# Patient Record
Sex: Female | Born: 1945
Health system: Southern US, Community
[De-identification: ages and names within clinical notes are randomized; demographics above are authoritative.]

## PROBLEM LIST (undated history)

## (undated) DIAGNOSIS — M47812 Spondylosis without myelopathy or radiculopathy, cervical region: Secondary | ICD-10-CM

## (undated) DIAGNOSIS — M1990 Unspecified osteoarthritis, unspecified site: Secondary | ICD-10-CM

## (undated) DIAGNOSIS — H269 Unspecified cataract: Secondary | ICD-10-CM

## (undated) DIAGNOSIS — E559 Vitamin D deficiency, unspecified: Secondary | ICD-10-CM

## (undated) DIAGNOSIS — K219 Gastro-esophageal reflux disease without esophagitis: Secondary | ICD-10-CM

## (undated) DIAGNOSIS — I776 Arteritis, unspecified: Secondary | ICD-10-CM

## (undated) DIAGNOSIS — Z9889 Other specified postprocedural states: Secondary | ICD-10-CM

## (undated) DIAGNOSIS — L409 Psoriasis, unspecified: Secondary | ICD-10-CM

## (undated) DIAGNOSIS — IMO0001 Reserved for inherently not codable concepts without codable children: Secondary | ICD-10-CM

## (undated) DIAGNOSIS — N390 Urinary tract infection, site not specified: Secondary | ICD-10-CM

## (undated) DIAGNOSIS — Z8719 Personal history of other diseases of the digestive system: Secondary | ICD-10-CM

## (undated) DIAGNOSIS — M310 Hypersensitivity angiitis: Secondary | ICD-10-CM

## (undated) DIAGNOSIS — K648 Other hemorrhoids: Secondary | ICD-10-CM

## (undated) DIAGNOSIS — E785 Hyperlipidemia, unspecified: Secondary | ICD-10-CM

## (undated) DIAGNOSIS — N321 Vesicointestinal fistula: Secondary | ICD-10-CM

## (undated) DIAGNOSIS — I4891 Unspecified atrial fibrillation: Secondary | ICD-10-CM

## (undated) DIAGNOSIS — M8580 Other specified disorders of bone density and structure, unspecified site: Secondary | ICD-10-CM

## (undated) DIAGNOSIS — I471 Supraventricular tachycardia: Secondary | ICD-10-CM

## (undated) DIAGNOSIS — K5790 Diverticulosis of intestine, part unspecified, without perforation or abscess without bleeding: Secondary | ICD-10-CM

## (undated) DIAGNOSIS — K429 Umbilical hernia without obstruction or gangrene: Secondary | ICD-10-CM

## (undated) DIAGNOSIS — I472 Ventricular tachycardia: Secondary | ICD-10-CM

## (undated) DIAGNOSIS — G7000 Myasthenia gravis without (acute) exacerbation: Secondary | ICD-10-CM

## (undated) DIAGNOSIS — I700 Atherosclerosis of aorta: Secondary | ICD-10-CM

## (undated) DIAGNOSIS — G479 Sleep disorder, unspecified: Secondary | ICD-10-CM

## (undated) DIAGNOSIS — T7840XA Allergy, unspecified, initial encounter: Secondary | ICD-10-CM

## (undated) DIAGNOSIS — R112 Nausea with vomiting, unspecified: Secondary | ICD-10-CM

## (undated) DIAGNOSIS — K5792 Diverticulitis of intestine, part unspecified, without perforation or abscess without bleeding: Secondary | ICD-10-CM

## (undated) DIAGNOSIS — I10 Essential (primary) hypertension: Secondary | ICD-10-CM

## (undated) DIAGNOSIS — J189 Pneumonia, unspecified organism: Secondary | ICD-10-CM

## (undated) HISTORY — PX: HERNIA REPAIR: SHX51

## (undated) HISTORY — PX: APPENDECTOMY: SHX54

## (undated) HISTORY — DX: Psoriasis, unspecified: L40.9

## (undated) HISTORY — PX: CHOLECYSTECTOMY: SHX55

## (undated) HISTORY — DX: Myasthenia gravis without (acute) exacerbation: G70.00

## (undated) HISTORY — PX: CERVICAL FUSION: SHX112

## (undated) HISTORY — PX: THORACENTESIS: SHX235

## (undated) HISTORY — DX: Supraventricular tachycardia: I47.1

## (undated) HISTORY — DX: Unspecified atrial fibrillation: I48.91

## (undated) HISTORY — DX: Reserved for inherently not codable concepts without codable children: IMO0001

## (undated) HISTORY — DX: Ventricular tachycardia: I47.2

## (undated) HISTORY — PX: TOTAL SHOULDER ARTHROPLASTY: SHX126

## (undated) HISTORY — DX: Gastro-esophageal reflux disease without esophagitis: K21.9

## (undated) HISTORY — DX: Unspecified cataract: H26.9

## (undated) HISTORY — PX: SHOULDER SURGERY: SHX246

## (undated) HISTORY — DX: Arteritis, unspecified: I77.6

## (undated) HISTORY — PX: AV FISTULA REPAIR: SHX563

## (undated) HISTORY — PX: TOTAL ABDOMINAL HYSTERECTOMY W/ BILATERAL SALPINGOOPHORECTOMY: SHX83

## (undated) HISTORY — DX: Allergy, unspecified, initial encounter: T78.40XA

## (undated) HISTORY — DX: Hyperlipidemia, unspecified: E78.5

---

## 1997-06-06 ENCOUNTER — Other Ambulatory Visit: Admission: RE | Admit: 1997-06-06 | Discharge: 1997-06-06 | Payer: Self-pay | Admitting: Obstetrics and Gynecology

## 1998-06-24 ENCOUNTER — Inpatient Hospital Stay (HOSPITAL_COMMUNITY): Admission: EM | Admit: 1998-06-24 | Discharge: 1998-06-25 | Payer: Self-pay | Admitting: Internal Medicine

## 1998-09-01 ENCOUNTER — Other Ambulatory Visit: Admission: RE | Admit: 1998-09-01 | Discharge: 1998-09-01 | Payer: Self-pay | Admitting: Obstetrics and Gynecology

## 1999-02-05 ENCOUNTER — Encounter: Admission: RE | Admit: 1999-02-05 | Discharge: 1999-02-05 | Payer: Self-pay | Admitting: Orthopedic Surgery

## 1999-02-05 ENCOUNTER — Encounter: Payer: Self-pay | Admitting: Orthopedic Surgery

## 2000-09-26 ENCOUNTER — Encounter: Payer: Self-pay | Admitting: Internal Medicine

## 2000-09-26 ENCOUNTER — Encounter: Admission: RE | Admit: 2000-09-26 | Discharge: 2000-09-26 | Payer: Self-pay | Admitting: Internal Medicine

## 2001-10-16 ENCOUNTER — Encounter: Admission: RE | Admit: 2001-10-16 | Discharge: 2001-10-16 | Payer: Self-pay | Admitting: Internal Medicine

## 2001-10-16 ENCOUNTER — Encounter: Payer: Self-pay | Admitting: Internal Medicine

## 2002-04-08 ENCOUNTER — Observation Stay (HOSPITAL_COMMUNITY): Admission: EM | Admit: 2002-04-08 | Discharge: 2002-04-09 | Payer: Self-pay

## 2003-06-19 ENCOUNTER — Encounter: Payer: Self-pay | Admitting: Internal Medicine

## 2004-06-29 ENCOUNTER — Ambulatory Visit: Payer: Self-pay | Admitting: Internal Medicine

## 2005-08-31 ENCOUNTER — Ambulatory Visit (HOSPITAL_BASED_OUTPATIENT_CLINIC_OR_DEPARTMENT_OTHER): Admission: RE | Admit: 2005-08-31 | Discharge: 2005-09-01 | Payer: Self-pay | Admitting: Orthopedic Surgery

## 2005-09-16 ENCOUNTER — Ambulatory Visit: Payer: Self-pay | Admitting: Internal Medicine

## 2005-09-16 ENCOUNTER — Observation Stay (HOSPITAL_COMMUNITY): Admission: EM | Admit: 2005-09-16 | Discharge: 2005-09-17 | Payer: Self-pay | Admitting: Emergency Medicine

## 2005-09-17 ENCOUNTER — Ambulatory Visit: Payer: Self-pay | Admitting: Internal Medicine

## 2005-09-20 ENCOUNTER — Ambulatory Visit: Payer: Self-pay | Admitting: Internal Medicine

## 2005-09-28 ENCOUNTER — Ambulatory Visit: Payer: Self-pay | Admitting: Internal Medicine

## 2006-02-04 ENCOUNTER — Ambulatory Visit (HOSPITAL_COMMUNITY): Admission: RE | Admit: 2006-02-04 | Discharge: 2006-02-05 | Payer: Self-pay | Admitting: Neurosurgery

## 2006-06-20 ENCOUNTER — Encounter: Payer: Self-pay | Admitting: Internal Medicine

## 2006-10-16 ENCOUNTER — Emergency Department (HOSPITAL_COMMUNITY): Admission: EM | Admit: 2006-10-16 | Discharge: 2006-10-16 | Payer: Self-pay | Admitting: Emergency Medicine

## 2006-10-21 ENCOUNTER — Telehealth (INDEPENDENT_AMBULATORY_CARE_PROVIDER_SITE_OTHER): Payer: Self-pay | Admitting: *Deleted

## 2006-10-24 ENCOUNTER — Ambulatory Visit: Payer: Self-pay | Admitting: Internal Medicine

## 2006-10-27 ENCOUNTER — Telehealth (INDEPENDENT_AMBULATORY_CARE_PROVIDER_SITE_OTHER): Payer: Self-pay | Admitting: *Deleted

## 2006-10-27 ENCOUNTER — Emergency Department (HOSPITAL_COMMUNITY): Admission: EM | Admit: 2006-10-27 | Discharge: 2006-10-28 | Payer: Self-pay | Admitting: Emergency Medicine

## 2006-11-17 ENCOUNTER — Ambulatory Visit: Payer: Self-pay | Admitting: Internal Medicine

## 2006-11-21 ENCOUNTER — Telehealth (INDEPENDENT_AMBULATORY_CARE_PROVIDER_SITE_OTHER): Payer: Self-pay | Admitting: *Deleted

## 2006-11-30 ENCOUNTER — Encounter (HOSPITAL_BASED_OUTPATIENT_CLINIC_OR_DEPARTMENT_OTHER): Admission: RE | Admit: 2006-11-30 | Discharge: 2007-01-15 | Payer: Self-pay | Admitting: Surgery

## 2006-12-12 ENCOUNTER — Encounter: Payer: Self-pay | Admitting: Internal Medicine

## 2006-12-14 ENCOUNTER — Telehealth: Payer: Self-pay | Admitting: Internal Medicine

## 2007-01-13 ENCOUNTER — Telehealth (INDEPENDENT_AMBULATORY_CARE_PROVIDER_SITE_OTHER): Payer: Self-pay | Admitting: *Deleted

## 2007-01-16 ENCOUNTER — Ambulatory Visit: Payer: Self-pay | Admitting: Internal Medicine

## 2007-01-16 ENCOUNTER — Telehealth: Payer: Self-pay | Admitting: Internal Medicine

## 2007-01-17 ENCOUNTER — Encounter: Payer: Self-pay | Admitting: Internal Medicine

## 2007-01-19 ENCOUNTER — Telehealth: Payer: Self-pay | Admitting: Internal Medicine

## 2007-01-20 ENCOUNTER — Encounter (INDEPENDENT_AMBULATORY_CARE_PROVIDER_SITE_OTHER): Payer: Self-pay | Admitting: *Deleted

## 2007-01-20 LAB — CONVERTED CEMR LAB
Basophils Relative: 0.1 % (ref 0.0–1.0)
CO2: 29 meq/L (ref 19–32)
Calcium: 8.9 mg/dL (ref 8.4–10.5)
Chloride: 95 meq/L — ABNORMAL LOW (ref 96–112)
Eosinophils Relative: 0.3 % (ref 0.0–5.0)
Glucose, Bld: 109 mg/dL — ABNORMAL HIGH (ref 70–99)
HCT: 35.7 % — ABNORMAL LOW (ref 36.0–46.0)
Hemoglobin: 12.2 g/dL (ref 12.0–15.0)
MCHC: 34.1 g/dL (ref 30.0–36.0)
MCV: 84.2 fL (ref 78.0–100.0)
Monocytes Absolute: 0.4 10*3/uL (ref 0.2–0.7)
Potassium: 3.6 meq/L (ref 3.5–5.1)
RBC: 4.24 M/uL (ref 3.87–5.11)
Sodium: 134 meq/L — ABNORMAL LOW (ref 135–145)

## 2007-01-23 ENCOUNTER — Telehealth (INDEPENDENT_AMBULATORY_CARE_PROVIDER_SITE_OTHER): Payer: Self-pay | Admitting: *Deleted

## 2007-01-24 ENCOUNTER — Ambulatory Visit: Payer: Self-pay | Admitting: Internal Medicine

## 2007-01-24 DIAGNOSIS — R609 Edema, unspecified: Secondary | ICD-10-CM

## 2007-01-24 DIAGNOSIS — R5381 Other malaise: Secondary | ICD-10-CM | POA: Insufficient documentation

## 2007-01-24 DIAGNOSIS — Z862 Personal history of diseases of the blood and blood-forming organs and certain disorders involving the immune mechanism: Secondary | ICD-10-CM

## 2007-01-24 DIAGNOSIS — R5383 Other fatigue: Secondary | ICD-10-CM

## 2007-01-24 DIAGNOSIS — IMO0001 Reserved for inherently not codable concepts without codable children: Secondary | ICD-10-CM

## 2007-01-26 LAB — CONVERTED CEMR LAB
ALT: 21 units/L (ref 0–35)
AST: 24 units/L (ref 0–37)
BUN: 8 mg/dL (ref 6–23)
Basophils Absolute: 0 10*3/uL (ref 0.0–0.1)
Eosinophils Absolute: 0.1 10*3/uL (ref 0.0–0.6)
HCT: 34.9 % — ABNORMAL LOW (ref 36.0–46.0)
Hemoglobin: 11.6 g/dL — ABNORMAL LOW (ref 12.0–15.0)
Monocytes Absolute: 0.9 10*3/uL — ABNORMAL HIGH (ref 0.2–0.7)
Monocytes Relative: 10.1 % (ref 3.0–11.0)
Neutro Abs: 6.5 10*3/uL (ref 1.4–7.7)
Platelets: 484 10*3/uL — ABNORMAL HIGH (ref 150–400)
Potassium: 3.5 meq/L (ref 3.5–5.1)
Rhuematoid fact SerPl-aCnc: <20 intl units/mL — ABNORMAL LOW (ref 0.0–20.0)
Sed Rate: 78 mm/hr — ABNORMAL HIGH (ref 0–25)
Total CK: 39 units/L (ref 7–177)

## 2007-02-09 ENCOUNTER — Encounter (INDEPENDENT_AMBULATORY_CARE_PROVIDER_SITE_OTHER): Payer: Self-pay | Admitting: *Deleted

## 2007-02-10 ENCOUNTER — Ambulatory Visit: Payer: Self-pay | Admitting: Internal Medicine

## 2007-02-10 DIAGNOSIS — M6281 Muscle weakness (generalized): Secondary | ICD-10-CM | POA: Insufficient documentation

## 2007-02-10 DIAGNOSIS — R700 Elevated erythrocyte sedimentation rate: Secondary | ICD-10-CM

## 2007-02-10 DIAGNOSIS — M79609 Pain in unspecified limb: Secondary | ICD-10-CM | POA: Insufficient documentation

## 2007-02-11 LAB — CONVERTED CEMR LAB

## 2007-02-13 ENCOUNTER — Encounter (INDEPENDENT_AMBULATORY_CARE_PROVIDER_SITE_OTHER): Payer: Self-pay | Admitting: *Deleted

## 2007-02-15 ENCOUNTER — Ambulatory Visit: Payer: Self-pay

## 2007-02-15 ENCOUNTER — Encounter: Payer: Self-pay | Admitting: Internal Medicine

## 2007-03-14 ENCOUNTER — Encounter: Payer: Self-pay | Admitting: Internal Medicine

## 2007-03-16 ENCOUNTER — Encounter: Payer: Self-pay | Admitting: Internal Medicine

## 2007-03-17 ENCOUNTER — Encounter: Payer: Self-pay | Admitting: Internal Medicine

## 2007-06-08 ENCOUNTER — Encounter: Payer: Self-pay | Admitting: Internal Medicine

## 2007-06-18 ENCOUNTER — Emergency Department (HOSPITAL_COMMUNITY): Admission: EM | Admit: 2007-06-18 | Discharge: 2007-06-18 | Payer: Self-pay | Admitting: Family Medicine

## 2007-06-19 ENCOUNTER — Ambulatory Visit: Payer: Self-pay | Admitting: Internal Medicine

## 2007-06-19 DIAGNOSIS — M310 Hypersensitivity angiitis: Secondary | ICD-10-CM | POA: Insufficient documentation

## 2007-06-20 ENCOUNTER — Encounter: Payer: Self-pay | Admitting: Internal Medicine

## 2007-06-22 ENCOUNTER — Telehealth: Payer: Self-pay | Admitting: Internal Medicine

## 2007-06-23 ENCOUNTER — Ambulatory Visit: Payer: Self-pay | Admitting: Internal Medicine

## 2007-06-26 ENCOUNTER — Telehealth: Payer: Self-pay | Admitting: Internal Medicine

## 2007-08-04 ENCOUNTER — Telehealth (INDEPENDENT_AMBULATORY_CARE_PROVIDER_SITE_OTHER): Payer: Self-pay | Admitting: *Deleted

## 2008-01-12 HISTORY — PX: LAMINECTOMY: SHX219

## 2008-06-25 ENCOUNTER — Ambulatory Visit: Payer: Self-pay | Admitting: Internal Medicine

## 2008-06-25 DIAGNOSIS — E559 Vitamin D deficiency, unspecified: Secondary | ICD-10-CM

## 2008-06-25 DIAGNOSIS — E785 Hyperlipidemia, unspecified: Secondary | ICD-10-CM | POA: Insufficient documentation

## 2008-06-25 DIAGNOSIS — I10 Essential (primary) hypertension: Secondary | ICD-10-CM

## 2008-06-26 ENCOUNTER — Encounter: Payer: Self-pay | Admitting: Internal Medicine

## 2008-08-02 ENCOUNTER — Encounter (INDEPENDENT_AMBULATORY_CARE_PROVIDER_SITE_OTHER): Payer: Self-pay | Admitting: *Deleted

## 2008-08-20 ENCOUNTER — Telehealth: Payer: Self-pay | Admitting: Internal Medicine

## 2008-08-30 ENCOUNTER — Encounter: Payer: Self-pay | Admitting: Internal Medicine

## 2008-09-12 ENCOUNTER — Encounter: Payer: Self-pay | Admitting: Internal Medicine

## 2009-02-26 ENCOUNTER — Encounter: Payer: Self-pay | Admitting: Internal Medicine

## 2009-02-28 ENCOUNTER — Encounter: Admission: RE | Admit: 2009-02-28 | Discharge: 2009-02-28 | Payer: Self-pay | Admitting: Neurosurgery

## 2009-03-06 ENCOUNTER — Encounter: Payer: Self-pay | Admitting: Internal Medicine

## 2009-03-13 ENCOUNTER — Encounter: Payer: Self-pay | Admitting: Internal Medicine

## 2009-03-27 ENCOUNTER — Ambulatory Visit (HOSPITAL_COMMUNITY): Admission: RE | Admit: 2009-03-27 | Discharge: 2009-03-28 | Payer: Self-pay | Admitting: Neurosurgery

## 2009-04-23 ENCOUNTER — Encounter: Payer: Self-pay | Admitting: Internal Medicine

## 2009-06-25 ENCOUNTER — Encounter: Payer: Self-pay | Admitting: Internal Medicine

## 2009-10-01 ENCOUNTER — Encounter: Payer: Self-pay | Admitting: Internal Medicine

## 2009-10-27 ENCOUNTER — Ambulatory Visit: Payer: Self-pay | Admitting: Internal Medicine

## 2009-11-21 ENCOUNTER — Ambulatory Visit: Payer: Self-pay | Admitting: Cardiology

## 2009-11-21 ENCOUNTER — Ambulatory Visit: Payer: Self-pay | Admitting: Internal Medicine

## 2009-11-21 ENCOUNTER — Telehealth (INDEPENDENT_AMBULATORY_CARE_PROVIDER_SITE_OTHER): Payer: Self-pay | Admitting: *Deleted

## 2009-11-21 DIAGNOSIS — J019 Acute sinusitis, unspecified: Secondary | ICD-10-CM

## 2009-11-21 DIAGNOSIS — H532 Diplopia: Secondary | ICD-10-CM

## 2009-11-21 DIAGNOSIS — R209 Unspecified disturbances of skin sensation: Secondary | ICD-10-CM

## 2009-11-24 ENCOUNTER — Telehealth (INDEPENDENT_AMBULATORY_CARE_PROVIDER_SITE_OTHER): Payer: Self-pay | Admitting: *Deleted

## 2009-11-24 LAB — CONVERTED CEMR LAB
Basophils Absolute: 0 10*3/uL (ref 0.0–0.1)
Basophils Relative: 0 % (ref 0–1)
Eosinophils Absolute: 0 10*3/uL (ref 0.0–0.7)
Eosinophils Relative: 0 % (ref 0–5)
Hemoglobin: 12.7 g/dL (ref 12.0–15.0)
MCHC: 31.5 g/dL (ref 30.0–36.0)
MCV: 83.4 fL (ref 78.0–100.0)
Monocytes Absolute: 0.8 10*3/uL (ref 0.1–1.0)
Neutro Abs: 12.1 10*3/uL — ABNORMAL HIGH (ref 1.7–7.7)
Neutrophils Relative %: 87 % — ABNORMAL HIGH (ref 43–77)
Platelets: 355 10*3/uL (ref 150–400)
RBC: 4.83 M/uL (ref 3.87–5.11)
WBC: 13.8 10*3/uL — ABNORMAL HIGH (ref 4.0–10.5)

## 2009-11-25 ENCOUNTER — Encounter: Payer: Self-pay | Admitting: Internal Medicine

## 2009-11-25 ENCOUNTER — Telehealth: Payer: Self-pay | Admitting: Internal Medicine

## 2009-11-28 ENCOUNTER — Encounter: Payer: Self-pay | Admitting: Internal Medicine

## 2010-01-12 ENCOUNTER — Encounter: Payer: Self-pay | Admitting: Internal Medicine

## 2010-01-26 ENCOUNTER — Encounter: Payer: Self-pay | Admitting: Internal Medicine

## 2010-01-31 ENCOUNTER — Encounter: Payer: Self-pay | Admitting: Psychiatry

## 2010-02-10 NOTE — Letter (Signed)
Summary: [facility]  [facility]   Imported By: Lanelle Bal 03/31/2009 12:03:34  _____________________________________________________________________  External Attachment:    Type:   Image     Comment:   External Document

## 2010-02-10 NOTE — Progress Notes (Signed)
Summary: Call Report- CT Results  Phone Note From Other Clinic   Caller: Rose CT Summary of Call: Report avaliable for review, patient waiting:  I spoke with patient and informed her per Dr.Hopper, some sinusitis but nothing that would affect her vision. Patient to start ABX, patient ok'd all information./Chrae [facility] CMA  November 21, 2009 4:56 PM

## 2010-02-10 NOTE — Letter (Signed)
Summary: [facility]  [facility]   Imported By: Lanelle Bal 03/25/2009 14:21:22  _____________________________________________________________________  External Attachment:    Type:   Image     Comment:   External Document

## 2010-02-10 NOTE — Assessment & Plan Note (Signed)
Summary: DISCUSS MEDICATION RENEWALS,BCBS/RH.....   Vital Signs:  Patient profile:   65 year old female Height:      63 inches Weight:      288.8 pounds BMI:     51.34 Temp:     98.1 degrees F oral Pulse rate:   72 / minute Resp:     14 per minute BP sitting:   136 / 88  (left arm) Cuff size:   large  Vitals Entered By: Shonna Chock CMA (October 27, 2009 4:01 PM) CC: Follow-up visit: Discuss meds and order mammogram, Lipid Management   CC:  Follow-up visit: Discuss meds and order mammogram and Lipid Management.  History of Present Illness:     Kimberly [name] is here for med refills; she is doing well except for edema related to vasculitis for which she sees Dr Corliss Skains & for which Prednisone is Rxed. She had T1-2 laminectomy in 03/2009 by Dr Venetia Maxon; EKG was done pre operatively.  The patient reports intermittent  fatigue, but denies lightheadedness, urinary frequency, and headaches.  Associated symptoms include dyspnea initially starting  walking, but it resolves with continued walking.  The patient denies the following associated symptoms: chest pain, chest pressure, palpitations, syncope. She has both lleg edema  and pedal edema.  Compliance with medications (by patient report) has been near 100%.  The patient reports that dietary compliance has been good.  The patient reports exercising 2-3 X per week.  Adjunctive measures currently used by the patient include salt restriction.  BP @ home averages 152/82-84. Lipids have not been checked in several years. No colonoscopy ; "I just haven't" SOC reviewed.   Lipid Management History:      Positive NCEP/ATP [name] risk factors include female age 33 years old or older, family history for ischemic heart disease (females less than 17 years old), and hypertension.  Negative NCEP/ATP [name] risk factors include no history of early menopause without estrogen hormone replacement, non-diabetic, non-tobacco-user status, no ASHD (atherosclerotic heart disease), no  prior stroke/TIA, no peripheral vascular disease, and no history of aortic aneurysm.     Current Medications (verified): 1)  Furosemide 40 Mg Tabs (Furosemide) .Marland Kitchen.. 1 By Mouth Once Daily**appointment Due** 2)  Diovan 320 Mg Tabs (Valsartan) .Marland Kitchen.. 1 By Mouth Once Daily**appointment Due** 3)  Prednisone 10 Mg Tabs (Prednisone) .Marland Kitchen.. 1 By Mouth Once Daily 4)  B-6 200mg  .... 1 By Mouth Once Daily 5)  Centrum Silver  Tabs (Multiple Vitamins-Minerals) .Marland Kitchen.. 1 By Mouth Once Daily 6)  Zinc 50 Mg Tabs (Zinc) .Marland Kitchen.. 1 By Mouth Once Daily 7)  Verapamil Hcl 120 Mg Tabs (Verapamil Hcl) .Marland Kitchen.. 1 Once Daily  Allergies: 1)  ! Tetracycline 2)  ! Sulfa 3)  ! Zithromax 4)  ! Amlodipine Besy-Benazepril Hcl (Amlodipine Besy-Benazepril Hcl)  Past History:  Past Medical History: Click murmur w/o  MVP on  2-D ECHO 1987 Elevated hepatic enzymes 1999, ? due to NSAIDS right carotid bruit; Leukocytoclastic vasculitis, Dr Corliss Skains HTN; + ANA 1989 , low titer Hyperlipidemia: LDL 182, HDL 55.4, TG 103 in 2005. Framingham Study LDL goal = < 130.  Past Surgical History: Appendectomy Hysterectomy for  endometriosis and fibroids Oophorectomy Cholecystectomy left shoulder surgery right shoulder surgery Thoracentesis for peripneumonic effusion  T1-2  laminectomy  2011 Cervical fusion C5-6 ,2008  Family History: sister:  HTN, thyroid disease mother:? MI age 24, CHF died 2005/05/14 paternal grandmother: CVA father: lung cancer  Social History: Never Smoked Alcohol use-no 1200 calorie , low fat diet  Physical  Exam  General:  in no acute distress; alert,appropriate and cooperative throughout examination Neck:  No deformities, masses, or tenderness noted. Lungs:  Normal respiratory effort, chest expands symmetrically. Lungs are clear to auscultation, no crackles or wheezes. Heart:  normal rate, regular rhythm, no gallop, no rub, no JVD, no HJR, and grade 1 /6 systolic murmur.   Abdomen:  Bowel sounds  positive,abdomen soft and non-tender without masses, organomegaly or hernias noted. Pulses:  R and L carotid,radial,dorsalis pedis and posterior tibial pulses are full and equal bilaterally Extremities:  1+ left pedal edema and 1+ right pedal edema.   Neurologic:  alert & oriented X3 and DTRs symmetrical and normal.   Skin:  Mi nimal  erythema of shins Cervical Nodes:  No lymphadenopathy noted Axillary Nodes:  No palpable lymphadenopathy Psych:  memory intact for recent and remote, normally interactive, and good eye contact.     Impression & Recommendations:  Problem # 1:  UNSPECIFIED ESSENTIAL HYPERTENSION (ICD-401.9) ? suboptimal control Her updated medication list for this problem includes:    Furosemide 40 Mg Tabs (Furosemide) .Marland Kitchen... 1 by mouth once daily    Diovan 320 Mg Tabs (Valsartan) .Marland Kitchen... 1 by mouth once daily    Verapamil Hcl 120 Mg Tabs (Verapamil hcl) .Marland Kitchen... 1 once daily if bp averages > 135/85  Problem # 2:  HYPERLIPIDEMIA (ICD-272.4)  Problem # 3:  VASCULITIS (ICD-447.6) as per Dr Corliss Skains  Problem # 4:  VITAMIN D DEFICIENCY (ICD-268.9) as per Dr Corliss Skains  Complete Medication List: 1)  Furosemide 40 Mg Tabs (Furosemide) .Marland Kitchen.. 1 by mouth once daily 2)  Diovan 320 Mg Tabs (Valsartan) .Marland Kitchen.. 1 by mouth once daily 3)  Prednisone 10 Mg Tabs (Prednisone) .Marland Kitchen.. 1 by mouth once daily 4)  B-6 200mg   .... 1 by mouth once daily 5)  Centrum Silver Tabs (Multiple vitamins-minerals) .Marland Kitchen.. 1 by mouth once daily 6)  Zinc 50 Mg Tabs (Zinc) .Marland Kitchen.. 1 by mouth once daily 7)  Verapamil Hcl 120 Mg Tabs (Verapamil hcl) .Marland Kitchen.. 1 once daily if bp averages > 135/85 8)  Calcium 1200mg   .... 1 by mouth once daily 9)  Vitamin D3 2000 Unit Caps (Cholecalciferol) .Marland Kitchen.. 1 by mouth once daily  Lipid Assessment/Plan:      Based on NCEP/ATP [name], the patient's risk factor category is "2 or more risk factors and a calculated 10 year CAD risk of > 20%".  The patient's lipid goals are as follows: Total  cholesterol goal is 200; LDL cholesterol goal is 130; HDL cholesterol goal is 40; Triglyceride goal is 150.    Patient Instructions: 1)  Pleaseschedule fasting labs; see Diagnoses for Codes: 2)  Schedule your mammogram. 3)  Schedule a colonoscopy to help detect colon cancer as per the Standard of  Care as discussed.Marland Kitchen 4)  Check your Blood Pressure regularly. If it is above: you should make an appointment. 5)  BMP: 401.9 ;TSH : 272.4; 6)  [place] Heart Lipid Panel ( 1304 X) : 272.4, V17.3; 7)  HbgA1C : 277.7. Prescriptions: VERAPAMIL HCL 120 MG TABS (VERAPAMIL HCL) 1 once daily if BP AVERAGES > 135/85  #90 x 0   Entered and Authorized by:   Marga Melnick MD   Signed by:   Marga Melnick MD on 10/27/2009   Method used:   Print then Give to Patient   RxID:   [id number] DIOVAN 320 MG TABS (VALSARTAN) 1 by mouth once daily  #90 x 0   Entered and Authorized by:   Chrissie Noa  Hopper MD   Signed by:   Marga Melnick MD on 10/27/2009   Method used:   Print then Give to Patient   RxID:   [id number] FUROSEMIDE 40 MG TABS (FUROSEMIDE) 1 by mouth once daily  #90 x 0   Entered and Authorized by:   Marga Melnick MD   Signed by:   Marga Melnick MD on 10/27/2009   Method used:   Print then Give to Patient   RxID:   [id number]    Orders Added: 1)  Est. Patient Level IV [84696]

## 2010-02-10 NOTE — Assessment & Plan Note (Signed)
Summary: Numbness,weakness,and vision concerns/scm   Vital Signs:  Patient profile:   65 year old female Weight:      276 pounds BMI:     49.07 Temp:     98.6 degrees F oral Pulse rate:   80 / minute Resp:     15 per minute BP sitting:   142 / 94  (left arm) Cuff size:   large  Vitals Entered By: Shonna Chock CMA (November 21, 2009 2:04 PM) CC: Patient is at the end of a cold and also with double vision ( if looking out of one eye, vision is fine), numbness in entire face (including mouth), and low grade fever, URI symptoms   CC:  Patient is at the end of a cold and also with double vision ( if looking out of one eye, vision is fine), numbness in entire face (including mouth), and low grade fever, and URI symptoms.  History of Present Illness:      This is a 65 year old woman who presents with URI symptoms over past 2 weeks.  The patient reports nasal congestion, purulent nasal discharge, and productive cough with scant yellow, but denies sore throat and earache.  The patient denies fever, dyspnea, and wheezing.  The patient also reports  frontal headache.  The patient denies itchy watery eyes and sneezing.  The patient denies the following risk factors for Strep sinusitis: bilateral facial pain, tooth pain, and tender adenopathy. Facial pressure & numbness of entire face  & diplopia with both eyes open since 11/20/2009. Rx: Tylenol. She is on steroids for vasculitis in her legs from Dr Corliss Skains.  Current Medications (verified): 1)  Furosemide 40 Mg Tabs (Furosemide) .Marland Kitchen.. 1 By Mouth Once Daily 2)  Diovan 320 Mg Tabs (Valsartan) .Marland Kitchen.. 1 By Mouth Once Daily 3)  Prednisone 10 Mg Tabs (Prednisone) .Marland Kitchen.. 1 By Mouth Once Daily 4)  B-6 200mg  .... 1 By Mouth Once Daily 5)  Centrum Silver  Tabs (Multiple Vitamins-Minerals) .Marland Kitchen.. 1 By Mouth Once Daily 6)  Zinc 50 Mg Tabs (Zinc) .Marland Kitchen.. 1 By Mouth Once Daily 7)  Verapamil Hcl 120 Mg Tabs (Verapamil Hcl) .Marland Kitchen.. 1 Once Daily If Bp Averages > 135/85 8)   Calcium 1200mg  .... 1 By Mouth Once Daily 9)  Vitamin D3 2000 Unit Caps (Cholecalciferol) .Marland Kitchen.. 1 By Mouth Once Daily  Allergies: 1)  ! Tetracycline 2)  ! Sulfa 3)  ! Zithromax 4)  ! Amlodipine Besy-Benazepril Hcl (Amlodipine Besy-Benazepril Hcl)  Review of Systems General:  Denies chills and sweats. Eyes:  Denies discharge, eye pain, and red eye. ENT:  Denies decreased hearing, ear discharge, and ringing in ears. Neuro:  Complains of poor balance and weakness; denies brief paralysis and sensation of room spinning; Weakness in legs.  Physical Exam  General:  in no acute distress but  appears uncomfortable  Head:  Normocephalic and atraumatic without obvious abnormalities.  Eyes:  No corneal or conjunctival inflammation noted. EOMI. Perrla. FOV intact . Vision grossly normal @ 6 ft reading wall chart.OD squinted to prevent diplopia Ears:  External ear exam shows no significant lesions or deformities.  Otoscopic examination reveals clear canals, tympanic membranes are intact bilaterally without bulging, retraction, inflammation or discharge. Hearing is grossly normal bilaterally. Nose:  External nasal examination shows no deformity or inflammation. Nasal mucosa are pink and moist without lesions or exudates. Mouth:  Oral mucosa and oropharynx without lesions or exudates.  Teeth in good repair. Lungs:  Normal respiratory effort, chest expands symmetrically.  Lungs are clear to auscultation, no crackles or wheezes. Heart:  Normal rate and regular rhythm. S1 and S2 normal without gallop, murmur, click, rub. Pulses:  R and L carotid  pulses are full and equal bilaterally; no bruits Neurologic:  alert & oriented X3, cranial nerves II-XII intact, strength normal in all extremities, sensation intact to light touch over , gait  unsteady , DTRs symmetrical and normal, finger-to-nose normal, and Romberg negative.     Impression & Recommendations:  Problem # 1:  SINUSITIS- ACUTE-NOS  (ICD-461.9)  Orders: Specimen Handling (16109) Radiology Referral (Radiology)  Her updated medication list for this problem includes:    Amoxicillin 500 Mg Caps (Amoxicillin) .Marland Kitchen... 1 three times a day  Problem # 2:  DISTURBANCE OF SKIN SENSATION (ICD-782.0) Assessment: Unchanged  facial numbness   Orders: Specimen Handling (60454) Radiology Referral (Radiology)  Problem # 3:  DIPLOPIA (ICD-368.2)  Orders: Radiology Referral (Radiology) Ophthalmology Referral (Ophthalmology)  Complete Medication List: 1)  Furosemide 40 Mg Tabs (Furosemide) .Marland Kitchen.. 1 by mouth once daily 2)  Diovan 320 Mg Tabs (Valsartan) .Marland Kitchen.. 1 by mouth once daily 3)  Prednisone 10 Mg Tabs (Prednisone) .Marland Kitchen.. 1 by mouth once daily 4)  B-6 200mg   .... 1 by mouth once daily 5)  Centrum Silver Tabs (Multiple vitamins-minerals) .Marland Kitchen.. 1 by mouth once daily 6)  Zinc 50 Mg Tabs (Zinc) .Marland Kitchen.. 1 by mouth once daily 7)  Verapamil Hcl 120 Mg Tabs (Verapamil hcl) .Marland Kitchen.. 1 once daily if bp averages > 135/85 8)  Calcium 1200mg   .... 1 by mouth once daily 9)  Vitamin D3 2000 Unit Caps (Cholecalciferol) .Marland Kitchen.. 1 by mouth once daily 10)  Amoxicillin 500 Mg Caps (Amoxicillin) .Marland Kitchen.. 1 three times a day  Patient Instructions: 1)  Monitor for fever  . If symptoms  progress ; go to ER with this record.  Prescriptions: AMOXICILLIN 500 MG CAPS (AMOXICILLIN) 1 three times a day  #30 x 0   Entered and Authorized by:   Marga Melnick MD   Signed by:   Marga Melnick MD on 11/21/2009   Method used:   Faxed to ...       [facility]  [phone]* (retail)       [address] [place], [place]  [address]       Ph: [phone] or [phone]       Fax: [phone]   RxID:   [id number]    Orders Added: 1)  Est. Patient Level IV [25956] 2)  Specimen Handling [99000] 3)  Radiology Referral [Radiology] 4)  Ophthalmology Referral [Ophthalmology]

## 2010-02-10 NOTE — Letter (Signed)
Summary: [facility]  [facility]   Imported By: Lanelle Bal 03/13/2009 08:46:49  _____________________________________________________________________  External Attachment:    Type:   Image     Comment:   External Document

## 2010-02-10 NOTE — Letter (Signed)
Summary: [facility]  [facility]   Imported By: Lanelle Bal 12/05/2009 08:46:38  _____________________________________________________________________  External Attachment:    Type:   Image     Comment:   External Document

## 2010-02-10 NOTE — Progress Notes (Signed)
Summary: Neurological referral  Phone Note From Other Clinic Call back at [phone]   Caller: Dr.[facility]at Summary of Call: Dr.Groats office feels that pt needs a Neurological Assessment. The feel Dr.Hopper should be the one to refer pt. I asked them to fax [place] there OV notes so we have there findings for Dr.Hopper to review. Please adivse on referral. Initial call taken by: Army Fossa CMA,  November 25, 2009 9:42 AM  Follow-up for Phone Call        REFERRAL & ALL INFO FAXED TO [facility] FOR ASAP APPT, I WILL NOTIFY PATIENT WHEN GIVEN APPT. Magdalen Spatz [facility]  November 25, 2009 2:04 PM

## 2010-02-10 NOTE — Assessment & Plan Note (Signed)
Summary: cerate is still up//ph   Vital Signs:  Patient Profile:   65 Years Old Female Weight:      227.25 pounds Temp:     98.3 degrees F oral Pulse rate:   60 / minute Pulse rhythm:   regular Resp:     18 per minute BP sitting:   118 / 66  (left arm) Cuff size:   large  Pt. in pain?   no  Vitals Entered By: Wendall Stade (February 10, 2007 2:42 PM)                  Chief Complaint:  still having problems with legs.  History of Present Illness:  A sed rate done at her office was  86, working on a walker, left leg still swollen, red, and both legs very stiff.  Patient has a hard time getting up. She is having weakness  in legs from hips down. Major weakness is proximal in thighs. EMG/NCT tests were essen WNL Not on a statin.  Current Allergies: ! TETRACYCLINE ! SULFA ! ZITHROMAX  Past Medical History:    Reviewed history from 01/24/2007 and no changes required:       diarrhea       cellulitis       MVP with normal 2-d ECHO       elevated hepatic enzymes       right carotid bruit  Past Surgical History:    Reviewed history from 01/24/2007 and no changes required:       Appendectomy       Hysterectomy endometriosis and fibroids       Oophorectomy       Cholecystectomy       left shoulder surgery       right shoulder surgery       pharyngitis       Thoracentesis ; +ANA 1989   Family History:    Reviewed history from 01/24/2007 and no changes required:       sister  HTN, thyroid disease       mother MI age 65, CHF died May 07, 2005       paternal grandmother CVA       father lung cancer  Social History:    Reviewed history from 01/24/2007 and no changes required:       Never Smoked       Alcohol use-no       Regular exercise-no    Review of Systems  General      Denies weight loss.      Some chills & low grade fever & sweats @ night.  GI      Denies bloody stools, change in bowel habits, constipation, dark tarry stools, diarrhea, gas, indigestion,  nausea, and vomiting.   Physical Exam  General:     Appears weak ; using walker Extremities:     Calves tender L> R; weakness in legs esp in L thigh. Pes planus, mild lipidema. Skin:     Mild erythema around closed lesion L mid shin    Impression & Recommendations:  Problem # 1:  MUSCLE PAIN (ICD-729.1)  The following medications were removed from the medication list:    Darvocet A500 100-500 Mg Tabs (Propoxyphene n-apap) .Marland Kitchen... 1 -2  q 6 hrs prn  Her updated medication list for this problem includes:    Hydrocodone-acetaminophen 5-325 Mg Tabs (Hydrocodone-acetaminophen) .Marland Kitchen... As needed pain  Orders: TLB-CK Total Only(Creatine Kinase/CPK) (82550-CK) Radiology Referral (Radiology) Rheumatology Referral (Rheumatology)  Problem # 2:  MUSCLE WEAKNESS (GENERALIZED) (ICD-728.87) esp legs  proximally Orders: TLB-CK Total Only(Creatine Kinase/CPK) (82550-CK) Rheumatology Referral (Rheumatology)   Problem # 3:  SALMONELLA GASTROENTERITIS (ICD-003.0) resolved Orders: Rheumatology Referral (Rheumatology)   Problem # 4:  CELLULITIS, LEFT LEG (ICD-682.6) non MRSA based on cultures The following medications were removed from the medication list:    Cipro 500 Mg Tabs (Ciprofloxacin hcl) .Marland Kitchen... 1 by mouth bid  Orders: T-Tib/Fib Left (73590TC) T-Tib/Fib Right (04540JW) Rheumatology Referral (Rheumatology)   Problem # 5:  ANA POSITIVE, HX OF (ICD-V12.3)  Orders: Rheumatology Referral (Rheumatology)   Complete Medication List: 1)  Furosemide 40 Mg Tabs (Furosemide) .Marland Kitchen.. 1 by mouth qd 2)  Diovan 320 Mg Tabs (Valsartan) .Marland Kitchen.. 1 by mouth qd 3)  Hydrocodone-acetaminophen 5-325 Mg Tabs (Hydrocodone-acetaminophen) .... As needed pain  Other Orders: TLB-Uric Acid, Blood (84550-URIC)   Patient Instructions: 1)  Please keep Rheu consult    ]

## 2010-02-10 NOTE — Letter (Signed)
Summary: [facility]  [facility]   Imported By: Lanelle Bal 07/28/2009 13:17:10  _____________________________________________________________________  External Attachment:    Type:   Image     Comment:   External Document

## 2010-02-10 NOTE — Letter (Signed)
Summary: [facility]  [facility]   Imported By: Lanelle Bal 10/22/2009 14:19:05  _____________________________________________________________________  External Attachment:    Type:   Image     Comment:   External Document

## 2010-02-10 NOTE — Letter (Signed)
Summary: [facility]  [facility]   Imported By: Lanelle Bal 05/09/2009 12:19:48  _____________________________________________________________________  External Attachment:    Type:   Image     Comment:   External Document

## 2010-02-10 NOTE — Progress Notes (Signed)
Summary: Triage  Phone Note Call from Patient   Caller: Spouse Summary of Call: Numbness on face(entire), double vision, weakness in legs, tounge is numb, since yesterday. Patient denies any chest pain or discomfort. Patient will see Dr.Hopper today at 1:45pm, offerd earlier appointment with Dr.Tabori, patient refused. Patient aware if any change or worsening of symptoms- emergency room, patient agreed   Shonna Chock CMA  November 21, 2009 9:18 AM

## 2010-02-10 NOTE — Progress Notes (Signed)
Summary: CT Results  Phone Note Outgoing Call Call back at [facility] Phone [phone]   Call placed by: Shonna Chock CMA,  November 24, 2009 2:53 PM Call placed to: Patient Details for Reason: CT Report Summary of Call: Spoke to patient: Inflammation of  sinus cavity L cheek , but no evidence of infection especially  in the orbits. Please take antibiotics as Rxed. Please see Dr Dione Booze to assess double vision. Hopp  Patient with pending appointment at 4:00pm today, copy of report faxed to Dr.Groat./Chrae [facility] CMA  November 24, 2009 2:55 PM

## 2010-02-12 ENCOUNTER — Encounter: Payer: Self-pay | Admitting: Internal Medicine

## 2010-02-12 ENCOUNTER — Ambulatory Visit (INDEPENDENT_AMBULATORY_CARE_PROVIDER_SITE_OTHER): Payer: BC Managed Care – PPO | Admitting: Internal Medicine

## 2010-02-12 DIAGNOSIS — G7000 Myasthenia gravis without (acute) exacerbation: Secondary | ICD-10-CM

## 2010-02-12 DIAGNOSIS — J209 Acute bronchitis, unspecified: Secondary | ICD-10-CM

## 2010-02-12 DIAGNOSIS — J069 Acute upper respiratory infection, unspecified: Secondary | ICD-10-CM | POA: Insufficient documentation

## 2010-02-12 NOTE — Letter (Signed)
Summary: [facility]  [facility]   Imported By: Lanelle Bal 12/22/2009 14:02:23  _____________________________________________________________________  External Attachment:    Type:   Image     Comment:   External Document

## 2010-02-13 ENCOUNTER — Other Ambulatory Visit: Payer: Self-pay | Admitting: Internal Medicine

## 2010-02-13 ENCOUNTER — Ambulatory Visit (INDEPENDENT_AMBULATORY_CARE_PROVIDER_SITE_OTHER)
Admission: RE | Admit: 2010-02-13 | Discharge: 2010-02-13 | Disposition: A | Discharge status: Home / Self Care | Payer: BC Managed Care – PPO | Source: Ambulatory Visit | Attending: Internal Medicine | Admitting: Internal Medicine

## 2010-02-13 DIAGNOSIS — J40 Bronchitis, not specified as acute or chronic: Secondary | ICD-10-CM

## 2010-02-13 DIAGNOSIS — G7000 Myasthenia gravis without (acute) exacerbation: Secondary | ICD-10-CM

## 2010-02-18 NOTE — Assessment & Plan Note (Signed)
Summary: URI    Vital Signs:  Patient profile:   65 year old female Weight:      276.8 pounds BMI:     49.21 Temp:     100.6 degrees F oral Pulse rate:   84 / minute Resp:     15 per minute BP sitting:   128 / 88  (left arm) Cuff size:   large  Vitals Entered By: Shonna Chock CMA (February 12, 2010 3:37 PM) CC: Cough and fever , URI symptoms   CC:  Cough and fever  and URI symptoms.  History of Present Illness:    Onset 02/07/2010 as  NP cough  & malaise; she now  denies nasal congestion, purulent nasal discharge, sore throat, and earache.  Associated symptoms include fever of 100.5-103 degrees, dyspnea, and wheezing, especially when supine.  No PMH of asthma. The patient denies frontal  headache.  Risk factors for Strep sinusitis include bilateral facial pain and tooth pain.  The patient denies the following risk factors for Strep sinusitis: tender adenopathy. Rx: Delsym , Tylenol.Significantly Myasthenia Gravis diagnosed @ [facility]; Mestinon started 3 weeks ago. [facility] records were reviewed; the list of contraindicated antibiotics was scanned.  Current Medications (verified): 1)  Furosemide 40 Mg Tabs (Furosemide) .Marland Kitchen.. 1 By Mouth Once Daily 2)  Diovan 320 Mg Tabs (Valsartan) .Marland Kitchen.. 1 By Mouth Once Daily 3)  Prednisone 10 Mg Tabs (Prednisone) .Marland Kitchen.. 1 By Mouth Once Daily 4)  B-6 200mg  .... 1 By Mouth Once Daily 5)  Centrum Silver  Tabs (Multiple Vitamins-Minerals) .Marland Kitchen.. 1 By Mouth Once Daily 6)  Zinc 50 Mg Tabs (Zinc) .Marland Kitchen.. 1 By Mouth Once Daily 7)  Verapamil Hcl 120 Mg Tabs (Verapamil Hcl) .Marland Kitchen.. 1 Once Daily If Bp Averages > 135/85 8)  Calcium 1200mg  .... 1 By Mouth Once Daily 9)  Vitamin D3 2000 Unit Caps (Cholecalciferol) .Marland Kitchen.. 1 By Mouth Once Daily 10)  Mestinon 60 Mg Tabs (Pyridostigmine Bromide) .... 1/2 By Mouth Three Times A Day  Allergies: 1)  ! Tetracycline 2)  ! Sulfa 3)  ! Zithromax 4)  ! Amlodipine Besy-Benazepril Hcl (Amlodipine Besy-Benazepril Hcl)  Past  History:  Past Medical History: Click murmur w/o  MVP on  2-D ECHO 1987 Elevated hepatic enzymes 1999, ? due to NSAIDS right carotid bruit; Leukocytoclastic vasculitis, Dr Corliss Skains HTN; + ANA 1989 , low titer Hyperlipidemia: LDL 182, HDL 55.4, TG 103 in 2005. Framingham Study LDL goal = < 130. Myasthenia Gravis 2011  Physical Exam  General:  in no acute distress; alert,appropriate and cooperative throughout examination Ears:  External ear exam shows no significant lesions or deformities.  Otoscopic examination reveals clear canals, tympanic membranes are intact bilaterally without bulging, retraction, inflammation or discharge. Hearing is grossly normal bilaterally. Nose:  External nasal examination shows no deformity or inflammation. Nasal mucosa are pink and moist without lesions or exudates. slight hyponasal speech Mouth:  Oral mucosa and oropharynx without lesions or exudates.  Teeth in good repair. Lungs:  Normal respiratory effort, chest expands symmetrically. Lungs are  essentially clear to auscultation,rare  crackles  & wheezes. Dry cough Heart:  Normal rate and regular rhythm. S1 and S2 normal without gallop, murmur, click, rub. S4 Cervical Nodes:  No lymphadenopathy noted Axillary Nodes:  No palpable lymphadenopathy   Impression & Recommendations:  Problem # 1:  BRONCHITIS-ACUTE (ICD-466.0)  The following medications were removed from the medication list:    Amoxicillin 500 Mg Caps (Amoxicillin) .Marland Kitchen... 1 three times a day Her  updated medication list for this problem includes:    Amoxicillin 500 Mg Caps (Amoxicillin) .Marland Kitchen... 1 three times a day    Benzonatate 200 Mg Caps (Benzonatate) .Marland Kitchen... 1 three times a day as needed for cough  Orders: T-2 View CXR (71020TC)  Problem # 2:  URI (ICD-465.9)  Her updated medication list for this problem includes:    Benzonatate 200 Mg Caps (Benzonatate) .Marland Kitchen... 1 three times a day as needed for cough  Problem # 3:  MYASTHENIA GRAVIS  WITHOUT EXACERBATION (ICD-358.00)  multiple antibiotics contraindicated ; list reviewed & scanned  Orders: T-2 View CXR (71020TC)  Complete Medication List: 1)  Furosemide 40 Mg Tabs (Furosemide) .Marland Kitchen.. 1 by mouth once daily 2)  Diovan 320 Mg Tabs (Valsartan) .Marland Kitchen.. 1 by mouth once daily 3)  Prednisone 10 Mg Tabs (Prednisone) .Marland Kitchen.. 1 by mouth once daily 4)  B-6 200mg   .... 1 by mouth once daily 5)  Centrum Silver Tabs (Multiple vitamins-minerals) .Marland Kitchen.. 1 by mouth once daily 6)  Zinc 50 Mg Tabs (Zinc) .Marland Kitchen.. 1 by mouth once daily 7)  Verapamil Hcl 120 Mg Tabs (Verapamil hcl) .Marland Kitchen.. 1 once daily if bp averages > 135/85 8)  Calcium 1200mg   .... 1 by mouth once daily 9)  Vitamin D3 2000 Unit Caps (Cholecalciferol) .Marland Kitchen.. 1 by mouth once daily 10)  Mestinon 60 Mg Tabs (Pyridostigmine bromide) .... 1/2 by mouth three times a day 11)  Amoxicillin 500 Mg Caps (Amoxicillin) .Marland Kitchen.. 1 three times a day 12)  Benzonatate 200 Mg Caps (Benzonatate) .Marland Kitchen.. 1 three times a day as needed for cough  Patient Instructions: 1)  Drink as much  NON dairy fluid as you can tolerate for the next few days. Ask Dr. Fidela Juneau if you can take Flu shot & Pneumonia vaccine. Carry the Warning List concerning Myasthenia with you @ all times;  consider downloading to a  flash drive . Go for chest Xray if not improving . Prescriptions: BENZONATATE 200 MG CAPS (BENZONATATE) 1 three times a day as needed for cough  #15 x 0   Entered and Authorized by:   Marga Melnick MD   Signed by:   Marga Melnick MD on 02/12/2010   Method used:   Electronically to        [facility]  [phone]* (retail)       [address] [place], [place]  [address]       Ph: [phone] or [phone]       Fax: [phone]   RxID:   [id number] AMOXICILLIN 500 MG CAPS (AMOXICILLIN) 1 three times a day  #30 x 0   Entered and Authorized by:   Marga Melnick MD   Signed by:   Marga Melnick MD on 02/12/2010   Method used:   Electronically to         [facility]  [phone]* (retail)       [address] [place], [place]  [address]       Ph: [phone] or [phone]       Fax: [phone]   RxID:   [id number]    Orders Added: 1)  Est. Patient Level [name] [76283] 2)  T-2 View CXR [71020TC]

## 2010-02-26 NOTE — Letter (Signed)
Summary: [facility]  [facility]   Imported By: Maryln Gottron 02/17/2010 10:59:16  _____________________________________________________________________  External Attachment:    Type:   Image     Comment:   External Document

## 2010-04-05 LAB — CBC
Platelets: 320 10*3/uL (ref 150–400)
WBC: 15.1 10*3/uL — ABNORMAL HIGH (ref 4.0–10.5)

## 2010-04-05 LAB — BASIC METABOLIC PANEL
Creatinine, Ser: 0.72 mg/dL (ref 0.4–1.2)
GFR calc non Af Amer: >60 mL/min (ref >60)
Glucose, Bld: 97 mg/dL (ref 70–99)
Potassium: 3.8 mEq/L (ref 3.5–5.1)

## 2010-05-26 NOTE — Assessment & Plan Note (Signed)
[facility]   NAME:  Kimberly [name], Kimberly [name]           ACCOUNT NO.:  [id number]   MEDICAL RECORD NO.:  [id number]      DATE OF BIRTH:  Apr 22, 1945   PHYSICIAN:  Theresia Majors. Tanda Rockers, M.D. VISIT DATE:  01/10/2007                                   OFFICE VISIT   SUBJECTIVE:  Kimberly [name] returns for evaluation of post-traumatic  stasis ulcer involving the left lower extremity. In the interim she  continues to  be ambulatory.  She has worn a compressive wrap.  There  has been scant drainage.   OBJECTIVE:  Blood pressure is 164/75, respirations 18, pulse rate 95,  temperature 98.6.  Inspection of the lower extremity shows that the  wound is continues to contract with healthy-appearing granulation  centrally there is advancing epithelium.  There is no evidence of  infection or ischemia.   ASSESSMENT:  Clinical improvement.   PLAN:  We have reapplied an Radio broadcast assistant.  We will reevaluate the patient  in 1 week.  We anticipate that the wound should be healed.  We we have  given the patient a prescription for compressive hose.  She will be  fitted on the right leg.  She will bring the garment with her during her  next visit.      Harold A. Tanda Rockers, M.D.  Electronically Signed     HAN/MEDQ  D:  01/10/2007  T:  01/10/2007  Job:  [id number]

## 2010-05-26 NOTE — Assessment & Plan Note (Signed)
[facility]   NAME:  Kimberly [name], Kimberly [name]           ACCOUNT NO.:  [id number]   MEDICAL RECORD NO.:  [id number]      DATE OF BIRTH:  Mar 31, 1945   PHYSICIAN:  Theresia Majors. Tanda Rockers, M.D. VISIT DATE:  12/20/2006                                   OFFICE VISIT   SUBJECTIVE:  Ms. Kimberly [name] is a 65 year old lady who we are following  for a post-traumatic stasis ulcer involving the left anterior leg.  In  the interim, we have treated her with the compression wrap.  There is  been no excessive drainage, malodor, pain or fever.  The patient reports  that she has significantly less pain.  She continues to be ambulatory.   OBJECTIVE:  Blood pressure is 146/71, respirations 16, pulse rate 79 and  temperature is 95.  Inspection of the wound shows that the edema is  reasonably controlled with the compression wrap.  The wound itself is  100% granulated with significant contraction.  It is clean with no  evidence of ascending infection or drainage.  The pedal pulse remains  3+.   ASSESSMENT:  Clinical improvement with compression.   PLAN:  We will place the patient in an Unna wrap and reevaluate her in 1  week p.r.n.      Harold A. Tanda Rockers, M.D.  Electronically Signed     HAN/MEDQ  D:  12/20/2006  T:  12/21/2006  Job:  [id number]

## 2010-05-26 NOTE — Assessment & Plan Note (Signed)
[facility]                                 ON-CALL NOTE   MAZIE, FENCL                    MRN:          [id number]  DATE:01/15/2007                            DOB:          1945-11-27    PHONE NUMBER:  [phone]   PRIMARY CARE PHYSICIAN:  Dr. Alwyn Ren.   SUBJECTIVE:  Ms. Handler is being treated for presumed C.  Difficile  with Flagyl.  She has had previous bouts with C.  Difficile within the  last month and has had some fever and diarrhea and blood in her stool.  That seems to be better since starting Flagyl 48 hours ago, but a new  symptoms today that she is concerned about is joint pain in her hips and  knees and ankles and feet.  She states that this started after beginning  the Flagyl.  Her fevers have improved on the Flagyl.  She denies any  redness, swelling in her joints.  She denies any abdominal pain.   ASSESSMENT AND PLAN:  Joint pain, possible reaction to Flagyl versus  complication of C.  Difficile.  At this point, I think given the  temporal relationship, the side effects of Flagyl may be most likely.  She has nothing to suggest septic arthritis.  She has an appointment  tomorrow with her primary.  She will hold her last dose of Flagyl this  evening and will take some pain medications for her joint pain.  She  will keep the appointment with her primary tomorrow.  If she has any of  the red flags noted previously.  She will be seen at the emergency room.     Kerby Nora, MD  Electronically Signed    AB/MedQ  DD: 01/15/2007  DT: 01/16/2007  Job #: [id number]

## 2010-05-26 NOTE — Assessment & Plan Note (Signed)
[facility]   NAME:  Kimberly [name], Kimberly [name]           ACCOUNT NO.:  [id number]   MEDICAL RECORD NO.:  [id number]      DATE OF BIRTH:  [birth year]   PHYSICIAN:  Theresia Majors. Tanda Rockers, M.D. VISIT DATE:  12/12/2006                                   OFFICE VISIT   SUBJECTIVE:  Ms. Bowmer is a 65 year old female who we are treating  for a draining hematoma with stasis ulcer physiology.  In the interim we  treated her with iodoform packing and an Unna wrap.  She removed the  packing 30 minutes prior to her visit and took a tub bath.  She presents  for follow-up.  There have been no interim excessive drainage malodor,  pain or fever.   OBJECTIVE:  Blood pressure is 155/75, respirations of 20, pulse rate 80,  temperature is 98.7.  She is accompanied by husband.  Inspection of the  left anterior extremity shows that there has been a recession of the  hyperemia.  There is no excessive drainage.  There is a soft exudate  throughout the wound which underwent an excisional debridement utilizing  a curette.  Nonviable tissue frankly necrotic skin edge was excised  without difficulty.  Hemorrhage was controlled with direct pressure.  Pedal pulses remain palpable.  There is no evidence of ascending  infection.   ASSESSMENT:  Clinical response to external compression of a post-  traumatic stasis ulcer.   PLAN:  We will resume a standard Unna boot protocol and reevaluate the  patient in 1 week.      Harold A. Tanda Rockers, M.D.  Electronically Signed     HAN/MEDQ  D:  12/12/2006  T:  12/12/2006  Job:  [id number]

## 2010-05-26 NOTE — Assessment & Plan Note (Signed)
[facility]   NAME:  BRIONNE, MERTZ           ACCOUNT NO.:  [id number]   MEDICAL RECORD NO.:  [id number]      DATE OF BIRTH:  1946/01/04   PHYSICIAN:  Theresia Majors. Tanda Rockers, M.D. VISIT DATE:  12/06/2006                                   OFFICE VISIT   SUBJECTIVE:  Ms. Kliewer is a 65 year old lady who we are following for  cellulitis involving the left lower extremity.  The patient had a closed  injury with a hematoma which was incised and drained previously.  She  has been on clindamycin and rifampin.  She reports that there has been  no fever, but there has been local warmth and drainage.  The leg is  moderately painful although she continues to go to work and to spend  time standing.   OBJECTIVE:  VITAL SIGNS:  Blood pressure is 154/98, respirations 18,  pulse rate 91, temperature 97.8.  EXTREMITIES:  Inspection of the left anterior leg shows that there has  been some containment in the erythema, but the punctate area of drainage  is sealed with a fibrin plug.  This area was mechanically debrided with  a 4x4, but there was too much pain.  We applied a local eusthenic  anesthetic mixture and with the addition of an infiltration of 1%  Xylocaine, a formal incision and drainage was performed with evacuation  of old liquefied hematoma and denatured protein.  There was no excessive  malodor.  The wound was cultured again.  The pedal pulse remained  palpable.  There remains tenderness extending into the soleus area.  An  iodoform gauze was placed in the wound to pack it open.  Prior to the  packing, the wound was copiously irrigated with saline.   ASSESSMENT:  Adequately drained abscess following the hematoma.   PLAN:  We will re-evaluate the patient in 24 hours to remove the packing  and assess her response to incision and drainage.  She will continue to  be ambulatory.  She can continue to work.  She should not have fever,  chills, or progressive discomfort.   If any of these should occur, she is  to be seen in the clinic or in the Emergency Room.  The patient was  given an opportunity to answer questions.  She seems to understand and  indicates that she will be compliant.      Harold A. Tanda Rockers, M.D.  Electronically Signed     HAN/MEDQ  D:  12/06/2006  T:  12/06/2006  Job:  [id number]

## 2010-05-26 NOTE — Assessment & Plan Note (Signed)
[facility]   NAME:  Kimberly [name], Kimberly [name]           ACCOUNT NO.:  [id number]   MEDICAL RECORD NO.:  [id number]      DATE OF BIRTH:  28-Nov-1945   PHYSICIAN:  Theresia Majors. Tanda Rockers, M.D. VISIT DATE:  01/04/2007                                   OFFICE VISIT   SUBJECTIVE:  Kimberly [name] is a 65 year old lady whom we are following  for post-traumatic stasis ulcer.  She returns for followup.  There has  been moderate drainage, no fever and no significant pain.  She continues  to be ambulatory.   OBJECTIVE:  Blood pressure is 169/65, respirations are 18, pulse rate  75, temperature is 98.3.  Inspection of the left anterior lower leg shows that the wound continues  to contract.  There is a central area of necrosis which underwent an  excisional debridement utilizing a curette.  Necrotic tissue, reactive  tissue, and necrosis were excised without difficulty.  The resulting  hemorrhage was controlled with direct pressure.  The pedal pulse remains  palpable.   ASSESSMENT:  Clinical improvement of stasis ulcer, adequate debrided.   PLAN:  We are returning the patient to a Unna wrap.  We will reevaluate  her in 1 week.      Harold A. Tanda Rockers, M.D.  Electronically Signed     HAN/MEDQ  D:  01/04/2007  T:  01/04/2007  Job:  [id number]

## 2010-05-26 NOTE — Assessment & Plan Note (Signed)
[facility]                                 ON-CALL NOTE   Kimberly, [name]                    MRN:          [id number]  DATE:06/18/2007                            DOB:          [birth year]    Time is 10:44 p.m. and phone number is [phone].  Dr. Alwyn Ren is the  regular doctor and Dr. Milinda Antis on call.   CHIEF COMPLAINT:  Tick bite.  The patient says last week she pulled 3 attached ticks off her.  They  were all little red dots and disappeared.  Now, one of the sites is  looking red with circles around it like a bull's eye.  She also just  feels very tired/wiped out, and bad in general, although denies a fever.  I told her to go to Urgent Care and get this checked out now, which is  what she is going to do.     Marne A. Tower, MD  Electronically Signed    MAT/MedQ  DD: 06/18/2007  DT: 06/19/2007  Job #: [id number]
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
## 2010-05-26 NOTE — Assessment & Plan Note (Signed)
[facility]   NAME:  Kimberly [name], Kimberly [name]           ACCOUNT NO.:  [id number]   MEDICAL RECORD NO.:  [id number]      DATE OF BIRTH:  January 31, 1945   PHYSICIAN:  Theresia Majors. Tanda Rockers, M.D. VISIT DATE:  12/26/2006                                   OFFICE VISIT   SUBJECTIVE:  Kimberly [name] is a 65 year old lady who we followed for  posttraumatic stasis ulcer involving her left anterior extremity.  In  the interim she has worn an Unna wrap.  There has bee no excessive  drainage, malodor, pain or fever.   OBJECTIVE:  Blood pressure is 164/66, respirations 16, pulse rate 81.  She is afebrile.  On inspection of her left anterior leg shows mild  hyperemia but no excessive drainage or malodor.  Pedal pulse remains  readily palpable.  There is associated 1+ edema.   ASSESSMENT:  Clinical improvement posttraumatic stasis ulcer.   PLAN:  We will resume the Unna wrap and re-evaluate the patient in 7-10  days .      Harold A. Tanda Rockers, M.D.  Electronically Signed     HAN/MEDQ  D:  12/26/2006  T:  12/27/2006  Job:  [id number]

## 2010-05-26 NOTE — Assessment & Plan Note (Signed)
[facility]   NAME:  Kimberly [name], Kimberly [name]           ACCOUNT NO.:  [id number]   MEDICAL RECORD NO.:  [id number]      DATE OF BIRTH:  07/08/1945   PHYSICIAN:  Theresia Majors. Tanda Rockers, M.D. VISIT DATE:  12/07/2006                                   OFFICE VISIT   SUBJECTIVE:  Ms. Piatt a 65 year old lady whom we have followed for  an infected hematoma involving her left anterior lateral leg.  In the  interim, we treated her with Iodoform packing and mildly compressive  dressing.  She returns reporting that there has been decreased pain,  continued drainage but no excessive malodor.  There has been no fever.  She continues to be ambulatory.   OBJECTIVE:  VITAL SIGNS:  Blood pressure is 135/66, respirations 20,  pulse rate 84, temperature 98.2.   Inspection of the left lower extremity shows that there is indeed less  hyperemia.  There is no excessive drainage.  The packing has been  removed.  There is healthy-appearing granulation but no re-  epithelialization.  There is no fluctuance and no evidence of  loculation.  The leg is warm, but there it is not feverish.  Pedal pulse  remains palpable.  There is no evidence of ischemia or ascending  infection.   ASSESSMENT:  Clinical improvement following evacuation, incision and  drainage of abscess.   PLAN:  We will continue to use the Iodoform dressing with an Unna boot  is SofSorb.  We have instructed the patient to remove the wrap 3 hours  prior to her next visit and take a 30-minute bath with antiseptic soap  wash.  We provided her with a fishnet and a 4x4 for the interim dressing  post removal of the [employer].  If she develops excessive drainage,  malodor, fever, or increasing pain, she is to be seen in the emergency  room.  Otherwise, we will see her on Monday, December 12, 2006.      Harold A. Tanda Rockers, M.D.  Electronically Signed     HAN/MEDQ  D:  12/07/2006  T:  12/07/2006  Job:  [id number]

## 2010-05-29 NOTE — Op Note (Signed)
NAMELANNA, Kimberly [name] NO.:  [id number]   MEDICAL RECORD NO.:  [id number]          PATIENT TYPE:  OIB   LOCATION:  3040                         FACILITY:  [facility]   PHYSICIAN:  Danae Orleans. Venetia Maxon, M.D.  DATE OF BIRTH:  May 26, 1945   DATE OF PROCEDURE:  02/04/2006  DATE OF DISCHARGE:                               OPERATIVE REPORT   PREOPERATIVE DIAGNOSIS:  Herniated cervical disc with cervical  spondylosis, degenerative disc disease, and cervical radiculopathy C4-C5  and C5-C6 level.   POSTOPERATIVE DIAGNOSIS:  Herniated cervical disc with cervical  spondylosis, degenerative disc disease, and cervical radiculopathy C4-C5  and C5-C6 level.   PROCEDURE:  Anterior cervical decompression and fusion C4-C5 and C5-C6  with PEEK interbody cages, morcellized bone autograft, and anterior  cervical plate.   SURGEON:  Danae Orleans. Venetia Maxon, M.D.   ASSISTANT:  Clydene Fake, M.D.   ANESTHESIA:  General endotracheal anesthesia.   BLOOD LOSS:  75 mL   COMPLICATIONS:  None.   DISPOSITION:  Recovery.   INDICATIONS:  Kimberly [name] is a 64 year old woman with severe left  shoulder and left arm pain with severe cervical disc degeneration and  spondylosis with foraminal stenosis, left greater than right, at the C4-  C5 and C5-C6 levels.  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  It was elected to take her to surgery for  anterior cervical decompression and fusion.  She is morbidly obese, 5  feet 3 inches tall and 280 pounds   DESCRIPTION OF PROCEDURE:  Ms. Glasner was brought to the operating  room.  Following a satisfactory and uncomplicated induction of general  endotracheal anesthesia and placement of intravenous lines, the patient  was placed in the supine position on the operating table.  Her neck was  placed in slight extension.  She was placed in 10 pounds of halter  traction. Her anterior neck was then prepped and draped in the usual  sterile fashion.  The area of planned incision was infiltrated with  0.25% Marcaine and 0.5% lidocaine with 1:100,000 epinephrine.  An  incision was made from the midline to the anterior border of the  sternocleidomastoid muscle and carried through the platysma layer.  Subplatysmal dissection was performed exposing the anterior border of  the sternocleidomastoid muscle. Using blunt dissection, the carotid  sheath was kept lateral, trachea and esophagus kept medial, exposing the  anterior cervical spine.  A bent spinal needle was placed at what was  felt to be the C4-C5 level and this was confirmed on interoperative x-  ray.  Subsequently, the longus colli muscles were taken down from the  anterior cervical spine from C4 to C6 using electrocautery and Key  elevator and ventral osteophytes were removed with Leksell rongeurs and  the bone spurs were cleaned of investing soft tissue and saved for use  in later bone grafting.  Subsequently, a Shadowline self-retaining  retractor was placed along with up and down retractors and the  interspace was incised at the C4-C5 and C5-C6 levels.  Disc material was  removed in piecemeal fashion.  Endplates were stripped  of residual  cartilaginous material.  Disc space spreaders were placed, initially at  C5-C6 and then the C4-C5 level, and the endplates were decorticated with  a high speed drill and uncinate spurs were drilled down.  The bone  material was saved for later use in bone grafting with a suction trap.  Subsequently, at the C4-C5 level, the posterior spurs were removed and  central spinal cord dura was decompressed as were both neural foramina  with resultant significant decompression of neural foramina bilaterally.  Hemostasis was assured with Gelfoam soaked thrombin.  After trial  sizing, it was elected to place a 7 mm PEEK interbody cage which was  packed with morcellized bone autograft inserted in the interspace and  countersunk  appropriately.  Attention was then turned to the C5-C6 level  where a similar decompression was performed and the endplates were  stripped of residual disc material, uncinate spurs were removed, and  both C6 nerve roots were decompressed as they exited out the neural  foramina, and central spinal cord dura was also decompressed.  Hemostasis was assured with Gelfoam soaked in thrombin.  Subsequently,  after trial sizing, a 6 mm medium PEEK interbody cage was selected and  packed with morcellized bone autograft and this was inserted in the  interspace and countersunk appropriately.  The traction weight was  removed and after trial sizing, a 32 mm Tressel anterior cervical plate  was affixed to the anterior cervical spine with variable angle 14 mm  screws, two at C4, two at C5, and two at C6.  All screws had excellent  purchase and locking mechanisms were engaged.  Final x-ray demonstrated  the upper aspect of the construct with well positioned interbody grafts  and anterior cervical plate with screws.  Subsequently, the wound was  irrigated with bacitracin saline.  The soft tissues were inspected and  found to be in good repair.  Hemostasis was assured.  The platysmal  layer was closed with 3-0 Vicryl sutures and the skin edges were  approximated with 3-0 Vicryl interrupted inverted sutures.  The wound  was dressed with Dermabond.  The patient was extubated in the operating  room and taken to the recovery room in stable satisfactory condition  having tolerated her operation well.      Danae Orleans. Venetia Maxon, M.D.  Electronically Signed     JDS/MEDQ  D:  02/04/2006  T:  02/04/2006  Job:  [id number]

## 2010-05-29 NOTE — Op Note (Signed)
[name], [name]           ACCOUNT NO.:  [id number]   MEDICAL RECORD NO.:  [id number]          PATIENT TYPE:  AMB   LOCATION:  DSC                          FACILITY:  [facility]   PHYSICIAN:  Katy Fitch. Sypher, M.D. DATE OF BIRTH:  1945/07/14   DATE OF PROCEDURE:  08/31/2005  DATE OF DISCHARGE:                                 OPERATIVE REPORT   PREOPERATIVE DIAGNOSES:  Chronic left rotator cuff tear with delaminated  retracted tear of supraspinatus, infraspinatus and partial involvement of  teres minor with AC degenerative arthritis and type 3 acromial morphology.   POSTOPERATIVE DIAGNOSES:  Chronic left rotator cuff tear with delaminated  retracted tear of supraspinatus, infraspinatus and partial involvement of  teres minor with AC degenerative arthritis and type 3 acromial morphology.  Identification of degenerative labral tear from 11 o'clock posteriorly to 3  o'clock anteriorly.  There was no evidence of significant glenohumeral  degenerative arthritis.   OPERATION:  1. Diagnostic arthroscopy left shoulder followed by arthroscopic      debridement of degenerative labrum.  2. Open subacromial decompression with acromioplasty, coracoacromial      ligament release and bursectomy.  3. Reconstruction of left rotator cuff with debridement of delaminated      tears of supraspinatus, infraspinatus and teres minor followed by      debridement and reconstruction with a pants over vest type repair      advanced laterally to the greater tuberosity utilizing to Bio-Corkscrew      anchors, two push locks for an over-the-top technique and anchors along      the medial joint line for a proper foot print.   OPERATIONS:  Kimberly Igo, MD.   ASSISTANT:  Annye Rusk PA-C.   ANESTHESIA:  General endotracheal supplemented by a left interscalene block.   SUPERVISING ANESTHESIOLOGIST:  Dr. Jairo Ben.   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  She presented for evaluation of acute pain in left shoulder in late June  2007 and was noted to have clinical evidence of a probable rotator cuff tear  with impingement signs, weakness of abduction and external rotation.  Plain  films demonstrated AC degenerative changes and a type 3 acromion and an MRI  documented a chronic delaminated tear of the supraspinatus and infraspinatus  to the conjoint tendon, significant undermining of the teres minor and  tendinopathy of the subscapularis.  She had unfavorable acromial anatomy and  documentation of AC arthropathy.   After informed consent, she is brought to the operating room at this time  anticipating arthroscopic evaluation of her glenohumeral joint, probable  labral debridement, subacromial decompression and reconstruction of the  rotator cuff as findings dictate. Preoperatively, she is advised of the  potential risks and benefits of surgery including benefits of pain relief  and improved strength with abduction, external rotation and improved night  comfort.  The potential risks include infection, failure of our surgical  construct, development of adhesive capsulitis and other unforeseen  complications.   After informed consent, she is brought  to the operating room at this time.   DESCRIPTION OF PROCEDURE:  Kimberly [name] is brought to the operating  room and placed in supine position on the table.  Following induction of  general anesthesia by endotracheal technique under Dr. Edison Pace direct  supervision, Ms. Mattioli was carefully positioned in the beach-chair  position with the aid of a torso and head holder designed for shoulder  arthroscopy.  The left arm and fore quarter were prepped with DuraPrep and  draped with impervious arthroscopy drapes.  The shoulder was distended with  20 mL of sterile saline placed anteriorly through a spinal needle into  the  glenohumeral joint followed by blunt placement of the arthroscope through a  standard posterior viewing portal.  Diagnostic arthroscopy revealed intact  hyaline articular cartilage surfaces of the glenoid and humeral head.  The  long head of the biceps had a stable anchor at the superior labrum and was  normal to the rotator interval.  The subscapularis had minor fraying but  appeared intact.  The anterior glenohumeral ligaments were normal.  The  supraspinatus and infraspinatus were well visualized.  There was a  delaminated degenerative tear with significant retraction.  This was  documented with digital camera followed by use of a suction shaver to  debride the labrum from 11 o'clock posterior superiorly to 3 o'clock  anteriorly.  The arthroscopic equipment was removed from the glenohumeral  joint and placed in the subacromial space.  Care was documented with a  digital camera from the bursal side.  At this point, we converted to an open  rotator cuff reconstruction.  A 6 cm incision was fashioned across the  anterior acromion.  The capsule of the [employer] [facility] joint was identified and the  distal 15 mm of clavicle exposed subperiosteally.  An oscillating saw was  used to remove the distal 15 mm of clavicle and to level the acromion to a  type 1 morphology.  There is a large medial osteophyte on the acromion that  was removed with a rongeur and the oscillating saw followed by smoothing  with a hand rasp.   A lateral osteophyte on the lateral acromion was removed with the power bur  followed by bursectomy.  The margins of the cuff tear were freshened  followed by identification of a delamination with a significant splitting of  the infraspinatus.  The greater tuberosity was decorticated from the  bicipital tendon anteriorly all the way posterior to the deep surface of the  teres minor.  The profile of the greater tuberosity was lowered approximately 4 mm with a power bur.  Two Bio-Corkscrew  anchors were placed,  one for the medial footprint of the supraspinatus, one for the infraspinatus  followed by a baseball stitch repairing a pants over vest rotator cuff  reconstruction.  A portion of the thin supraspinatus was overlapped over the  infraspinatus and subsequently advanced laterally through bone McLaughlin  technique.  The medial footprint of the supraspinous and infraspinatus was  inset with the mattress sutures of the Bio-Corkscrew anchors followed by use  of two lateral push lock anchors to create an over-the-top inset of the  edges of the tendon repair into the decorticated greater tuberosity.  A very  satisfactory low-profile construct was achieved.   The dead space created by distal clavicle resection was then closed by  repair of the anterior deltoid to the trapezius muscle with mattress sutures  of #2 FiberWire followed by repair of the anterior deltoid to the  capsule of  the [facility] joint and periosteum of the AC joint with mattress sutures of #2  FiberWire.   The deltoid split was repaired with simple suture of #0 Vicryl.   The skin was then repaired with #0 Vicryl, 2-0 Vicryl and intradermal 3-0  Prolene.   There were no apparent complications.   Ms. Perret tolerated the surgery and anesthesia well.  She was transferred  to the recovery room with stable vital signs.   She will be admitted to the recovery care center for observation of vital  signs and analgesic medication in the form of p.o. and IV Dilaudid and IV  PCA morphine.  We will provide Ancef 1 gram IV q.8 h x3 doses.   Note that PAS compression devices were used on her calves during the  surgery.  Ms. Huq has a BMI of 42.5 and is at risk for deep vein  thrombosis.  We will mobilize her immediately in the postoperative period.      Katy Fitch Sypher, M.D.  Electronically Signed     RVS/MEDQ  D:  08/31/2005  T:  08/31/2005  Job:  [id number]

## 2010-05-29 NOTE — Discharge Summary (Signed)
NAME:  Kimberly [name], TAKEDA                     ACCOUNT NO.:  [id number]   MEDICAL RECORD NO.:  [id number]                   PATIENT TYPE:  OBV   LOCATION:  3031                                 FACILITY:  [facility]   PHYSICIAN:  Rosalyn Gess. Norins, M.D. [facility]         DATE OF BIRTH:  08/19/1945   DATE OF ADMISSION:  04/07/2002  DATE OF DISCHARGE:  04/09/2002                                 DISCHARGE SUMMARY   ADMISSION DIAGNOSIS:  Pharyngitis.   DISCHARGE DIAGNOSIS:  Pharyngitis.   CONSULTANTS:  Kinnie Scales. Annalee Genta, M.D., for ENT.   PROCEDURES:  The patient had a CT scan of the neck which revealed no  evidence of pharyngeal abscess or airway compromised performed on April 07, 2002.  Chest x-ray on April 07, 2002, with no active pulmonary process.   HISTORY OF PRESENT ILLNESS:  The patient is a 65 year old woman who has had  a history since April 05, 2002, with significant pharyngitis.  She was  started on a Z-Pak as an outpatient, but was unable to swallow pills for  more than 24 hours.  Because of this problem, she presented to the emergency  department for evaluation.  She was subsequently admitted because of the  inability to swallow fluids, food, or medications and need for IV therapy.   Past medical history, family history, and social history per Dr. Titus Dubin.  Hopper's admit note.  Home medications likewise documented.   PHYSICAL EXAMINATION ON ADMISSION:  VITAL SIGNS:  Temperature 102.5 degrees,  blood pressure 143/68.  GENERAL APPEARANCE:  The patient was hoarse.  NECK:  She was tender to palpation in the right neck.   HOSPITAL COURSE:  The patient was started on IV antibiotics.  She did have a  white count of 16,600 with a left shift.  She had beta strep screen that was  negative.   The patient did well on this regimen, slowly being able to regain her  swallow, including liquids, but no solid foods.  She was seen in  consultation by Kinnie Scales. Annalee Genta, M.D., who agreed  with the present  therapy, but suggested that the patient be switched to Augmentin ES 600 mg/5  mL two teaspoons b.i.d. for 10 days and Lortab elixir for pain.  She also  should use warm saline gargles q.i.d.   The patient was doing well.  She said that her throat was still sore, but  she was able to handle full liquids.   DISCHARGE PHYSICAL EXAMINATION:  VITAL SIGNS:  Temperature 97.7 degrees,  blood pressure 136/74.  GENERAL APPEARANCE:  A pleasant woman in no acute distress.  NECK:  The patient has mild tenderness to palpation in the submandibular  regions.  CHEST:  Clear.  HEART:  Heart rate was regular.  ABDOMEN:  Soft.   LABORATORY DATA:  Final laboratory from April 08, 2002, with a white count  of 11,400, hemoglobin 12.2, and potassium 3.9.   DISPOSITION:  The  patient is discharged home.   DISCHARGE MEDICATIONS:  She will continue on her home medications.  Will  have her take liquid Augmentin and liquid Lortab elixir as noted.   FOLLOW-UP:  She will follow up with Onalee Hua L. Annalee Genta, M.D., et al, in 10  days.  She will see Titus Dubin. Alwyn Ren, M.D., in one to two weeks.   CONDITION ON DISCHARGE:  Stable and improved.                                               Rosalyn Gess Norins, M.D. [facility]    MEN/MEDQ  D:  04/09/2002  T:  04/09/2002  Job:  [id number]   cc:   Onalee Hua L. Annalee Genta, M.D.  [address]  [place]  [place] [address]  Fax: [phone]   Titus Dubin. Alwyn Ren, M.D. [facility]

## 2010-05-29 NOTE — H&P (Signed)
NAMEJANELI, Kimberly [name] NO.:  [id number]   MEDICAL RECORD NO.:  [id number]          PATIENT TYPE:  INP   LOCATION:  2037                         FACILITY:  [facility]   PHYSICIAN:  Titus Dubin. Hopper, MD,FACP,FCCPDATE OF BIRTH:  08/13/1945   DATE OF ADMISSION:  09/16/2005  DATE OF DISCHARGE:  09/17/2005                                HISTORY & PHYSICAL   Kimberly [name] is a 65 year old white female admitted with chest pain,  shortness of breath, hypertension and edema.   Approximately two weeks prior to her office visit September 2006, she  underwent rotator cuff surgery by Dr. Teressa Senter on August 21.  She had  experienced nausea post-op with Dilaudid and was switched to Ibuprofen.  Subsequent to the change in medications, she noted edema, nocturnal  shortness of breath and coccygeal tenderness.   She was seen August 31 by Dr. Teressa Senter, and ibuprofen was discontinued.  Despite this, the edema has progressed over the past week.   She also describes associated chest congestion and tightness for  approximately three days.  She describes it as a as if I were wearing a  tight bra.  She has had nausea daily, even off the Dilaudid and ibuprofen.   She has had two episodes where she has felt hot and clammy most recently  on September 19, 1993.  She has also had chills and states that she stays  cold.   PAST MEDICAL HISTORY:  Reveals an appendectomy in 1989.  She had a  hysterectomy and bilateral salpingo-oophorectomy in 1990 with endometriosis  and fibroids.  She had a cholecystectomy in 1973.  She had right shoulder  surgery in 2001.  She was hospitalized with pharyngitis in 2004.   She has a history of possible mitral valve prolapse based on 2D  echocardiogram findings.  In 1999, she had hepatic enzyme elevations.  She  has also been noted to have a right carotid bruits.   FAMILY HISTORY:  Hypertension and thyroid disease in her sister.  Mother had  myocardial infarction,  thyroid disease, congestive heart failure.  Her  mother died in 20-Apr-2022 of this year in the context of a pulmonary  thromboembolus.  Maternal grandmother had stroke.  Father had lung cancer.   She does not smoke or drink.   She is presently on Diovan HCT 320/12.5 daily, calcium supplement and  Prilosec as needed.   ALLERGIES:  SHE IS INTOLERANT, ALLERGIC TO TETRACYCLINE, SULFA AND POSSIBLY  AZITHROMYCIN.   REVIEW OF SYSTEMS:  Specifically positive for those items above.  The review  of systems was reviewed in toto.  She did wear compression hose following  the surgery and did not have chest pain or shortness of breath in the  immediate postoperative period.   Specifically, other than the chills, she has not had fever or diaphoresis.   She has had no ophthalmologic or underlying neurologic symptoms.   She denies any abdominal pain, nausea, vomiting or change in her stools.  She has had no genitourinary symptoms.   Other than the post-op pain, she has had no significant musculoskeletal  symptoms.  She has had  no paresthesias.   PHYSICAL EXAMINATION:  GENERAL:  She appears mildly uncomfortable.  VITAL SIGNS:  Blood pressure is 156/110, pulse 56, respiratory rate 22 to  24.  She is sitting at 90 degrees but is not clinically dyspneic.  FUNDAL:  Reveals arterial wall narrowing.  HEENT:  She has no lymphadenopathy about the head, neck or axilla.  The  otolaryngologic exam is unremarkable.  RESPIRATORY:  Breath sounds were decreased; she has no rales, wheezes.  CARDIOVASCULAR:  An S4 was noted without significant murmur.  All pulses  were intact, but the pedal pulses are decreased.  She has 2+ pedal edema.  ABDOMEN:  Nontender.  She has no organomegaly or masses.  Homan sign is  negative.  SKIN:  Warm and dry.   There is no neuropsychiatric diaphysis.  There are no obvious  musculoskeletal diaphysis to observation exam.   EKG revealed decreased T voltage in the V leads.   She is  now admitted with progressive edema, chest pain and significant  hypertension in a post-op setting.  It is necessary to rule out deep venous  thrombosis and/or pulmonary emboli.   She will be admitted with IV Lasix, telemetric monitoring, cardiac enzymes  and Lovenox coverage.  A beta blocker will be added because of the  uncontrolled hypertension.   Additional lab or radiographic studies other than spinal CT scan will be  pursued depending upon and directed by the pending labs.      Titus Dubin. Alwyn Ren, MD,FACP,FCCP  Electronically Signed     [facility]/MEDQ  D:  09/19/2005  T:  09/19/2005  Job:  [id number]   cc:   Katy Fitch. Sypher, M.D.

## 2010-05-29 NOTE — H&P (Signed)
NAME:  Kimberly [name], Kimberly [name]                     ACCOUNT NO.:  [id number]   MEDICAL RECORD NO.:  [id number]                   PATIENT TYPE:  OBV   LOCATION:  3031                                 FACILITY:  [facility]   PHYSICIAN:  Titus Dubin. Alwyn Ren, M.D. [facility]         DATE OF BIRTH:  [birth year]   DATE OF ADMISSION:  04/07/2002  DATE OF DISCHARGE:                                HISTORY & PHYSICAL   HISTORY OF PRESENT ILLNESS:  The patient is a 65 year old white female  admitted with a 3-day history of progressive pharyngitis associated with  high fevers.  She has been unable to swallow her pills over the last 24  hours and now is unable to handle saliva.  She is admitted for observation  for IV antibiotics and IV steroids.   The present illness has been associated with diffuse headache which was  relieved by parenteral pain medications.  She also had an earache on the  right.   PAST MEDICAL HISTORY:  Past medical history includes cholecystectomy,  rotator cuff surgery, total abdominal hysterectomy and bilateral salpingo-  oophorectomy and appendectomy.  She was hospitalized on one occasion for  chest pain at [facility].  She is gravida 1 para 1.   MEDICATIONS:  She is on Lotrel and hydrochlorothiazide for hypertension.   ALLERGIES:  SULFA has caused convulsions and TETRACYCLINE has caused rash.   SOCIAL HISTORY:  She does not drink or smoke.   FAMILY HISTORY:  Family history includes myocardial infarction, congestive  heart failure and thyroid disease in her mother and sister.  Her father had  cancer of the lung.  There is no family history of diabetes.   REVIEW OF SYSTEMS:  Review of systems is outlined above.  She has had some  itchy eyes but no dramatic extrinsic conjunctivitis symptoms.  She has had  no angioedema.   She has had some dyspepsia.   PHYSICAL EXAMINATION:  GENERAL:  She appears moderately ill; temperature is  102.5, blood pressure 143/68, pulse 116, respiratory  rate 22.  O2  saturations are 94% on room air.  SKIN:  She has pallor of the skin; skin is hot and dry.  HEENT:  Funduscopic exam is essential negative.  The oropharynx is dry  without significant tonsillar hypertrophy clinically.  She is tender over  the right neck without definite lymphadenopathy.  The right tympanic  membrane is dull.  It is to be noted that there is full extraocular motion.  There is no purulence in the nares.  CHEST:  Chest is essentially clear with only minimal bibasilar rales.  She  had a grade 1 raspy systolic murmur.  ABDOMEN:  Protuberant; she has a right upper quadrant __________  scar.  EXTREMITIES:  All pulses are intact and there is no edema.  No clubbing is  noted.   LABORATORY DATA:  CT scan shows tonsillar prominence but no abscess and no  airway compromise despite the dramatic clinical  findings.  Leukocytosis is  evident.   ASSESSMENT AND PLAN:  She has been on Zithromax since 04/05/2002 although  she was unable to swallow the pills this morning.   She will receive intravenous Rocephin.  Otolaryngologic consultation will be  pursued if there is failure to respond to the parenteral medications.     Titus Dubin. Alwyn Ren, M.D. [facility]               Titus Dubin. Alwyn Ren, M.D. [facility]    [facility]/MEDQ  D:  04/07/2002  T:  04/09/2002  Job:  [id number]

## 2010-05-29 NOTE — Discharge Summary (Signed)
Kimberly [name], Kimberly [name] NO.:  [id number]   MEDICAL RECORD NO.:  [id number]          PATIENT TYPE:  INP   LOCATION:  2037                         FACILITY:  [facility]   PHYSICIAN:  Rosalyn Gess. Norins, MD  DATE OF BIRTH:  Feb 26, 1945   DATE OF ADMISSION:  09/16/2005  DATE OF DISCHARGE:  09/17/2005                                 DISCHARGE SUMMARY   ADMITTING DIAGNOSES:  1. Shortness of breath.  2. EKG changes.   DISCHARGE DIAGNOSES:  1. Myocardial infarction ruled out.  2. Pulmonary embolus ruled out.  3. Renal function normal.   HISTORY OF THE PRESENT ILLNESS:  The patient had the rotator cuff surgery 2  weeks ago, August 31, 2005.  She had been on Dilaudid up to September 07, 2005  though this was then discontinued and she was switched to ibuprofen.  She  subsequently developed edema, nocturnal shortness of breath and tenderness.  She was seen by Dr. Alwyn Ren in the office who did an EKG which he felt showed  significant abnormalities.  The patient was subsequently admitted to rule  out for MI and PE.   Please see Dr. Frederik Pear note for past medical history, family history and  social history.   HOSPITAL COURSE:  The patient was admitted to a telemetry unit where she was  observed to have a normal sinus rhythm and normal tracings.  She did have  cardiac enzymes cycled that were negative x3 with CKs of 88, 99, 85.  Troponin I was 0.02, 0.01 and 0.01.  Brain natriuretic peptide was 100.  CT  scan of the chest with PE protocol was negative for PE or any other  parenchymal abnormalities.  Other laboratories were unremarkable with a  creatinine of 0.7.  The patient was noted to have a potassium of 3.0.   The patient at this point is feeling much better.  She had no shortness of  breath or chest discomfort.  Swelling in her legs has gone down and at this  point she is thought to be stable and ready for discharge.   DISCHARGE EXAMINATION:  VITAL SIGNS:  Temperature is 98.5,  blood pressure  122/48, pulse 67, respirations 20, O2 sats 96% on room air.  GENERAL APPEARANCE:  This is a heavy-set Caucasian woman in no acute  distress.  HEENT:  Exam is unremarkable.  CHEST:  Patient is moving air well with no rales, wheezes or rhonchi.  CARDIOVASCULAR:  2+ radial pulse.  Precordium was quiet.  She had a regular  rate and rhythm.  EXTREMITIES: With trace edema.   DISPOSITION:  The patient is discharged home.   DISCHARGE MEDICATIONS:  1. The patient will continue all her home medications.  2. Including Lasix 40 mg daily.  3. Diovan 320 mg daily.  4. HCTZ 12.5 mg will be held until seen by Dr. Alwyn Ren.  5. Lopressor 25 mg b.i.d.  6. Potassium 20 mEq daily until followed up by Dr. Alwyn Ren.  7. The patient may take Ativan 0.5 mg t.i.d. p.r.n. and zolpidem for      sleep.   FOLLOWUP:  Patient is to follow  up with Dr. Alwyn Ren, an appointment to be  scheduled.  She is to reports Dr. Frederik Pear office on Monday, September 20, 2005 for potassium followup.   CONDITION ON DISCHARGE:  The patient's condition at time of discharge  dictation is stable and improved.           ______________________________  Rosalyn Gess Norins, MD     MEN/MEDQ  D:  09/17/2005  T:  09/17/2005  Job:  [id number]   cc:   Titus Dubin. Alwyn Ren, MD,FACP,FCCP

## 2010-07-22 ENCOUNTER — Encounter: Payer: Self-pay | Admitting: Internal Medicine

## 2010-07-22 ENCOUNTER — Ambulatory Visit (INDEPENDENT_AMBULATORY_CARE_PROVIDER_SITE_OTHER): Payer: BC Managed Care – PPO | Admitting: Internal Medicine

## 2010-07-22 DIAGNOSIS — G7000 Myasthenia gravis without (acute) exacerbation: Secondary | ICD-10-CM

## 2010-07-22 DIAGNOSIS — H60399 Other infective otitis externa, unspecified ear: Secondary | ICD-10-CM

## 2010-07-22 DIAGNOSIS — H60509 Unspecified acute noninfective otitis externa, unspecified ear: Secondary | ICD-10-CM

## 2010-07-22 DIAGNOSIS — I776 Arteritis, unspecified: Secondary | ICD-10-CM

## 2010-07-22 MED ORDER — AMOXICILLIN-POT CLAVULANATE 875-125 MG PO TABS: 1.0000 | ORAL_TABLET | Freq: Two times a day (BID) | ORAL | Status: DC

## 2010-07-22 MED ORDER — AMOXICILLIN-POT CLAVULANATE 875-125 MG PO TABS: 1.0000 | ORAL_TABLET | Freq: Two times a day (BID) | ORAL | Status: AC

## 2010-07-22 MED ORDER — NEOMYCIN-POLYMYXIN-HC 3.5-10000-1 OT SOLN: 3.0000 [drp] | Freq: Four times a day (QID) | OTIC | Status: AC

## 2010-07-22 NOTE — Progress Notes (Signed)
Addended by: Doristine Devoid on: 07/22/2010 04:33 PM   Modules accepted: Orders

## 2010-07-22 NOTE — Patient Instructions (Signed)
Plain Mucinex for thick secretions ;force NON dairy fluids for next 48To ER if pain persists or is associaled with Warning Signs as discussed. (pain , pus , fever, SOB)

## 2010-07-22 NOTE — Progress Notes (Signed)
  Subjective:    Patient ID: Kimberly [name], female    DOB: 01-10-1946, 65 y.o.   MRN: [id number]  HPI EAR PAIN  Location: L > R    Onset: 7/10; progression over night. Actually initial symptoms were 7/6 as a sore throat with facial pain & frontal headache. These resolved with Tylenol  Symptoms  Sensation of fullness: yes, Ear discharge: yes, bloody URI symptoms: She denies nasal congestion/obstruction; nasal purulence; facial pain; anosmia; fatigue; headache; halitosis; or dental pain @ this time.  Fever: yes, low grade    Tinnitus: yes, high pitched , constant Dizziness: no Hearing loss: yes, ? Jaw click: no Rashes or lesions: no Facial muscle weakness: no  Red Flags  PMH recurrent OM: no  PMH prior ear surgery: no   Recent antibiotic usage (last 30 days):  no Diabetes or Immunosuppresion:   only on Mestinon for Myasthenia Gravis; on steroids ( Prednisone 10 mg daily) for Vasculitis      Review of Systems     Objective:   Physical Exam General appearance:appears mildly ill; in no acute distress ; no  increased work of breathing is present.  No  lymphadenopathy about the head, neck, or axilla noted.   Eyes: No conjunctival inflammation or lid edema is present. There is no scleral icterus. Extraocular motion is intact. There is no visual deficit significantly  Ears:  External ear exam shows no significant lesions or deformities.  Otoscopic examination reveals clear canals; tympanic membranes are intact bilaterally but reveal inflammation and edema. These changes are most marked on the left compared to the right . There is no active bleeding or. Lungs present. Nose:  External nasal examination shows no deformity or inflammation. Nasal mucosa are pink and moist without lesions or exudates. No septal dislocation or dislocation.No obstruction to airflow.   Oral exam: Dental hygiene is good; lips and gums are healthy appearing.There is no oropharyngeal erythema or exudate  noted. Slight hoarseness   Neck:  No deformities, thyromegaly, masses, or tenderness noted.   Supple with full range of motion without pain.   Heart:  Normal rate and regular rhythm. S1 and S2 normal without gallop, murmur, click, rub. S4 with slight slurring  Lungs:Chest clear to auscultation; no wheezes, rhonchi,rales ,or rubs present.No increased work of breathing.  Dry cough    Extremities:  No cyanosis,  or clubbing  noted . 1+ ankle edema  Skin: Warm & dry w/o jaundice or tenting.          Assessment & Plan:  #1 acute otitis externa/media, left greater than the right  #2 myasthenia gravis, on Mestinon therapy  #3 vasculitis, chronic steroid suppression  #4 multiple significant frank drug allergies as noted  Plan see orders

## 2010-08-03 ENCOUNTER — Other Ambulatory Visit: Payer: Self-pay | Admitting: Internal Medicine

## 2010-08-13 ENCOUNTER — Other Ambulatory Visit: Payer: Self-pay | Admitting: Internal Medicine

## 2010-11-29 ENCOUNTER — Other Ambulatory Visit: Payer: Self-pay | Admitting: Internal Medicine

## 2011-08-04 ENCOUNTER — Other Ambulatory Visit: Payer: Self-pay | Admitting: Internal Medicine

## 2011-08-04 NOTE — Telephone Encounter (Signed)
Patient needs to schedule a CPX  

## 2011-08-10 ENCOUNTER — Other Ambulatory Visit: Payer: Self-pay | Admitting: Internal Medicine

## 2011-08-11 NOTE — Telephone Encounter (Signed)
Patient needs to schedule a CPX  

## 2011-11-10 ENCOUNTER — Other Ambulatory Visit: Payer: Self-pay | Admitting: Internal Medicine

## 2011-12-16 ENCOUNTER — Other Ambulatory Visit: Payer: Self-pay | Admitting: Internal Medicine

## 2011-12-29 ENCOUNTER — Ambulatory Visit (INDEPENDENT_AMBULATORY_CARE_PROVIDER_SITE_OTHER): Payer: BC Managed Care – PPO | Admitting: Internal Medicine

## 2011-12-29 ENCOUNTER — Encounter: Payer: Self-pay | Admitting: Internal Medicine

## 2011-12-29 VITALS — BP 144/92 | HR 82 | Wt 289.0 lb

## 2011-12-29 DIAGNOSIS — I10 Essential (primary) hypertension: Secondary | ICD-10-CM

## 2011-12-29 DIAGNOSIS — E785 Hyperlipidemia, unspecified: Secondary | ICD-10-CM

## 2011-12-29 DIAGNOSIS — Z0000 Encounter for general adult medical examination without abnormal findings: Secondary | ICD-10-CM

## 2011-12-29 MED ORDER — FUROSEMIDE 40 MG PO TABS: 40.0000 mg | ORAL_TABLET | Freq: Every day | ORAL | Status: DC

## 2011-12-29 MED ORDER — VALSARTAN 320 MG PO TABS: 320.0000 mg | ORAL_TABLET | Freq: Every day | ORAL | Status: DC

## 2011-12-29 NOTE — Progress Notes (Signed)
  Subjective:    Patient ID: Kimberly [name], female    DOB: 02-03-1945, 66 y.o.   MRN: [id number]  HPI  Kimberly [name] is here for a physical; she denies new or acute issues .      Review of Systems CHRONIC HYPERTENSION: Disease Monitoring  Blood pressure range: 140s/high 70s  Chest pain: no   Dyspnea:only rushing or carrying items > 10 #  Claudication: no   Medication compliance: yes Medication Side Effects  Lightheadedness: no   Urinary frequency: with Lasix initially   Edema: yes; controlled with lasix & stockings   Preventitive Healthcare:  Exercise: no due to imbalance  Diet Pattern: Weight Watchers  Salt Restriction: yes  No colonoscopy to date; standard of care reviewed. She denies abdominal pain, unexplained weight loss, melena, or rectal bleeding.      Objective:   Physical Exam Gen.:  well-nourished in appearance. Alert, appropriate and cooperative throughout exam. Head: Normocephalic without obvious abnormalities Eyes: No corneal or conjunctival inflammation noted. Pupils equal round reactive to light and accommodation. Fundal exam is benign without hemorrhages, exudate, papilledema. Extraocular motion intact. Vision grossly normal with lenses. Ears: External  ear exam reveals no significant lesions or deformities. Canals clear .TMs normal. Hearing is grossly normal bilaterally. Nose: External nasal exam reveals no deformity or inflammation. Nasal mucosa are pink and moist. No lesions or exudates noted.  Mouth: Oral mucosa and oropharynx reveal no lesions or exudates. Teeth in good repair. Neck: No deformities, masses, or tenderness noted. Range of motion  & Thyroid normal. Lungs: Normal respiratory effort; chest expands symmetrically. Lungs are clear to auscultation without rales, wheezes, or increased work of breathing. Heart: Normal rate and rhythm. Normal S1 and S2. No gallop, click, or rub. Grade 1/6 systolic murmur with neck radiation Abdomen: Bowel sounds  normal; abdomen soft and nontender. No masses, organomegaly or hernias noted. Genitalia: S/p TAH& BSO Musculoskeletal/extremities: No deformity or scoliosis noted of  the thoracic or lumbar spine. Tone & strength  normal. Nail health  good. Finger. Fusiform changes the knees with crepitus, right greater than left. 1/2+ edema mid shin. Vascular: Carotid, radial artery, dorsalis pedis and  posterior tibial pulses are full and equal. Carotid bruit minute by radiation of murmur. ( carotid Dopplers have been negative the past) Neurologic: Alert and oriented x3. Deep tendon reflexes symmetrical and normal.          Skin: Nonblanching hyperpigmentation of the shins. Lymph: No cervical, axillary lymphadenopathy present. Psych: Mood and affect are normal. Normally interactive                                                                                         Assessment & Plan:  #1 comprehensive physical exam; no acute findings #2 see Problem List with Assessments & Recommendations Plan: see Orders

## 2011-12-29 NOTE — Patient Instructions (Addendum)
Preventive Health Care: Eat a low-fat diet with lots of fruits and vegetables, up to 7-9 servings per day.  Consume less than 30 grams of sugar per day from foods & drinks with High Fructose Corn Syrup as #1,2,3 or #4 on label. Health Care Power of Attorney & Living Will place you in charge of your health care  decisions. Verify these are  in place. MINIMAL  Blood Pressure Goal= AVERAGE < 140/90;  Ideal is an AVERAGE < 135/85. This AVERAGE should be calculated from @ least 5-7 BP readings taken @ different times of day on different days of week. You should not respond to isolated BP readings , but rather the AVERAGE for that week. As per the Standard of Care , screening Colonoscopy recommended @ 50 & every 5-10 years thereafter . More frequent monitor would be dictated by family history or findings @ Colonoscopy  Please  schedule fasting Labs : Lipids,  TSH. PLEASE BRING THESE INSTRUCTIONS TO FOLLOW UP  LAB APPOINTMENT.This will guarantee correct labs are drawn, eliminating need for repeat blood sampling ( needle sticks ! ). Diagnoses /Codes: 272.4  If you activate My Chart; the results can be released to you as soon as they populate from the lab. If you choose not to use this program; the labs have to be reviewed, copied & mailed   causing a delay in getting the results to you.

## 2011-12-29 NOTE — Assessment & Plan Note (Signed)
Vit D level 57 on 07/28/11

## 2011-12-29 NOTE — Assessment & Plan Note (Signed)
Renal function was normal 05/27/11 with creatinine of 0.63 and GFR greater than 89. Blood pressure goals discussed

## 2011-12-29 NOTE — Assessment & Plan Note (Signed)
Based on the advanced cholesterol testing; LDL minimal goal is less than 160, ideally less than 1:15. Fasting lipids should be checked along with TSH. There is a history of Graves' disease in both her mother and sister

## 2012-04-07 ENCOUNTER — Ambulatory Visit (INDEPENDENT_AMBULATORY_CARE_PROVIDER_SITE_OTHER): Payer: BC Managed Care – PPO | Admitting: Internal Medicine

## 2012-04-07 ENCOUNTER — Ambulatory Visit (INDEPENDENT_AMBULATORY_CARE_PROVIDER_SITE_OTHER)
Admission: RE | Admit: 2012-04-07 | Discharge: 2012-04-07 | Disposition: A | Discharge status: Home / Self Care | Payer: BC Managed Care – PPO | Source: Ambulatory Visit | Attending: Internal Medicine | Admitting: Internal Medicine

## 2012-04-07 ENCOUNTER — Encounter: Payer: Self-pay | Admitting: Internal Medicine

## 2012-04-07 VITALS — BP 132/78 | HR 88 | Temp 99.0°F | Wt 290.0 lb

## 2012-04-07 DIAGNOSIS — J0110 Acute frontal sinusitis, unspecified: Secondary | ICD-10-CM

## 2012-04-07 DIAGNOSIS — J209 Acute bronchitis, unspecified: Secondary | ICD-10-CM

## 2012-04-07 LAB — CBC WITH DIFFERENTIAL/PLATELET
Basophils Absolute: 0 10*3/uL (ref 0.0–0.1)
Lymphocytes Relative: 9.2 % — ABNORMAL LOW (ref 12.0–46.0)
Monocytes Relative: 9.1 % (ref 3.0–12.0)
Platelets: 284 10*3/uL (ref 150.0–400.0)
RDW: 16.2 % — ABNORMAL HIGH (ref 11.5–14.6)

## 2012-04-07 MED ORDER — AMOXICILLIN-POT CLAVULANATE 875-125 MG PO TABS: 1.0000 | ORAL_TABLET | Freq: Two times a day (BID) | ORAL | Status: DC

## 2012-04-07 MED ORDER — HYDROCODONE-HOMATROPINE 5-1.5 MG/5ML PO SYRP: 5.0000 mL | ORAL_SOLUTION | Freq: Four times a day (QID) | ORAL | Status: DC | PRN

## 2012-04-07 MED ORDER — BUDESONIDE-FORMOTEROL FUMARATE 160-4.5 MCG/ACT IN AERO: 2.0000 | INHALATION_SPRAY | Freq: Two times a day (BID) | RESPIRATORY_TRACT | Status: DC

## 2012-04-07 NOTE — Progress Notes (Signed)
  Subjective:    Patient ID: Kimberly [name], female    DOB: 03-Oct-1945, 67 y.o.   MRN: [id number]  HPI Symptoms began 04/03/12 as malaise and nonproductive cough. As of 3/26 she developed chest tightness, frontal headache, facial pain, dental pain, and yellow nasal discharge. Mucinex appeared to cause tachycardia. She does not believe that was Mucinex D.  She's developed chest tightness with occasional wheezing or shortness of breath.   Her temperature has been up to 101 over two days.  Significantly she is on daily prednisone @ 10 mg X 3 years as well as Mestinon. Also pertinent is a history of intolerance or allergy to azithromycin, tetracycline, and sulfa antibiotics.  She has no history of asthma. She has a remote history of pneumonia with associated pleural effusion. At that time her ANA was equivocally positive    Review of Systems She had some sneezing but no significant extrinsic symptoms of itchy or watery eyes.  She has had some discomfort in her ears, particularly on the right. There's been no otic discharge.     Objective:   Physical Exam General appearance:well nourished; no acute distress or increased work of breathing is present.  No  lymphadenopathy about the head, neck, or axilla noted.   Eyes: No conjunctival inflammation or lid edema is present. There is no scleral icterus.  Ears:  External ear exam shows no significant lesions or deformities.  Otoscopic examination reveals clear canals, tympanic membranes are intact bilaterally without bulging, retraction,  or discharge. There is mild hyperemia of the tympanic membranes.  Nose:  External nasal examination shows no deformity or inflammation. Nasal mucosa are dry  without lesions or exudates. No septal dislocation or deviation.No obstruction to airflow.   Oral exam: Dental hygiene is good; lips and gums are healthy appearing.There is no oropharyngeal erythema or exudate noted.   Neck:  No deformities,  masses,  or tenderness noted.   Supple with full range of motion without pain.   Heart:  Normal rate and regular rhythm. S1 and S2 normal without gallop, murmur, click, rub or other extra sounds. S4  Lungs: She has a raspy nonproductive cough. Diffuse musical rhonchi and wheezing are noted in all lung fields, essentially homogenous.   Extremities:  No cyanosis, edema, or clubbing  noted    Skin: Warm & dry w/o jaundice or tenting.        Assessment & Plan:  #1 rhinosinusitis   #2 acute bronchitis with bronchospasm   #3 chronic steroid administration  #4 multiple drug intolerances and allergies Plan: See orders and recommendations

## 2012-04-07 NOTE — Patient Instructions (Addendum)
Order for x-rays entered into  the computer; these will be performed at [address] [facility]. across from [facility]. No appointment is necessary. If you activate the  My Chart system; lab & Xray results will be released directly  to you as soon as I review & address these through the computer. Critical lab results will be communicated to you ASAP. If you choose not to sign up for My Chart within 36 hours of labs being drawn; results will be reviewed & interpretation added before being copied & mailed, causing a delay in getting the results to you.If you do not receive that report within 7-10 days ,please call. Additionally you can use this system to gain direct  access to your records  if  out of town or @ an office of a  physician who is not in  the My Chart network.  This improves continuity of care & places you in control of your medical record.   NSAIDS ( Aleve, Advil, Naproxen) or Tylenol every 4 hrs as needed for fever as discussed based on label recommendations.Stay well hydrated. Drink to thirst up to 40 ounces of fluids daily.  Increase your dose of prednisone to 20 mg daily until these acute symptoms have been resolved for at least 48-72 hours.

## 2012-04-24 ENCOUNTER — Ambulatory Visit (INDEPENDENT_AMBULATORY_CARE_PROVIDER_SITE_OTHER): Payer: BC Managed Care – PPO | Admitting: Internal Medicine

## 2012-04-24 ENCOUNTER — Ambulatory Visit (INDEPENDENT_AMBULATORY_CARE_PROVIDER_SITE_OTHER)
Admission: RE | Admit: 2012-04-24 | Discharge: 2012-04-24 | Disposition: A | Discharge status: Home / Self Care | Payer: BC Managed Care – PPO | Source: Ambulatory Visit | Attending: Internal Medicine | Admitting: Internal Medicine

## 2012-04-24 ENCOUNTER — Telehealth: Payer: Self-pay

## 2012-04-24 ENCOUNTER — Encounter: Payer: Self-pay | Admitting: Internal Medicine

## 2012-04-24 VITALS — BP 136/90 | HR 103 | Temp 98.9°F | Wt 289.0 lb

## 2012-04-24 DIAGNOSIS — R05 Cough: Secondary | ICD-10-CM

## 2012-04-24 DIAGNOSIS — R0600 Dyspnea, unspecified: Secondary | ICD-10-CM

## 2012-04-24 DIAGNOSIS — R059 Cough, unspecified: Secondary | ICD-10-CM

## 2012-04-24 DIAGNOSIS — R0781 Pleurodynia: Secondary | ICD-10-CM

## 2012-04-24 DIAGNOSIS — R071 Chest pain on breathing: Secondary | ICD-10-CM

## 2012-04-24 DIAGNOSIS — I776 Arteritis, unspecified: Secondary | ICD-10-CM

## 2012-04-24 DIAGNOSIS — G7000 Myasthenia gravis without (acute) exacerbation: Secondary | ICD-10-CM

## 2012-04-24 DIAGNOSIS — R0989 Other specified symptoms and signs involving the circulatory and respiratory systems: Secondary | ICD-10-CM

## 2012-04-24 LAB — CBC WITH DIFFERENTIAL/PLATELET
Basophils Absolute: 0.1 10*3/uL (ref 0.0–0.1)
Eosinophils Absolute: 0 10*3/uL (ref 0.0–0.7)
HCT: 34.8 % — ABNORMAL LOW (ref 36.0–46.0)
Hemoglobin: 11.4 g/dL — ABNORMAL LOW (ref 12.0–15.0)
Lymphs Abs: 0.6 10*3/uL — ABNORMAL LOW (ref 0.7–4.0)
MCHC: 32.6 g/dL (ref 30.0–36.0)
Monocytes Absolute: 1.2 10*3/uL — ABNORMAL HIGH (ref 0.1–1.0)
Neutro Abs: 11.6 10*3/uL — ABNORMAL HIGH (ref 1.4–7.7)
RDW: 16.2 % — ABNORMAL HIGH (ref 11.5–14.6)

## 2012-04-24 LAB — SEDIMENTATION RATE: Sed Rate: 56 mm/hr — ABNORMAL HIGH (ref 0–22)

## 2012-04-24 MED ORDER — LEVOFLOXACIN 500 MG PO TABS: 500.0000 mg | ORAL_TABLET | Freq: Every day | ORAL | Status: DC

## 2012-04-24 MED ORDER — TRAMADOL HCL 50 MG PO TABS: ORAL_TABLET | ORAL | Status: DC

## 2012-04-24 NOTE — Patient Instructions (Addendum)
Review and correct the record as indicated. Please share record with all medical staff seen.  To prevent acute insufficiency of  adrenal gland function; please increase the prednisone to 20 mg daily

## 2012-04-24 NOTE — Progress Notes (Signed)
  Subjective:    Patient ID: Kimberly [name], female    DOB: 01-15-1945, 67 y.o.   MRN: [id number]  HPI  She completed a course of generic Augmentin on 04/17/12. She felt as if the symptoms were essentially resolved; but as of 4/11 she developed a raspy cough and chest congestion.  The evenings of 4/12 and 4/13 were marked by dyspnea preventing her  from reclining.  The evening of 4/12 she had fever and sweats.  She's been having pleuritic discomfort in the right thorax with cough or deep breathing. No hemoptysis reported. She has had edema of her feet over the last 48 hours. She denies associated calf pain.   She is on chronic low-dose steroids. She did increase her dose of prednisone to 20 mg for 72 hours. She is on this for myasthenia gravis. She has vasculitis with ANA positivity.   Review of Systems She denies other chest pain or palpitations.  She denies associated itchy, watery eyes, or sneezing. She has had no frontal headache, facial pain, nasal purulence, dental pain, sore throat, otic pain, or otic discharge.  She also denies any significant reflux symptoms of dyspepsia or dysphasia.     Objective:   Physical Exam Gen.: Uncomfortable but in no distress.Well-nourished in appearance. Alert, appropriate and cooperative throughout exam. Eyes: No corneal or conjunctival inflammation noted.No icterus Ears: External  ear exam reveals no significant lesions or deformities. Canals clear .TMs normal. Hearing is grossly normal bilaterally. Nose: External nasal exam reveals no deformity or inflammation. Nasal mucosa are pink and moist. No lesions or exudates noted.   Mouth: Oral mucosa and oropharynx reveal no lesions or exudates. Teeth in good repair. Neck: No deformities, masses, or tenderness noted. No neck vein distention @ 30 degrees Lungs: Rodlike rhonchi suggested right lower lobe. Assessment difficult because of splinting. Overall breath sounds decreased in the right lower  lobe. Heart: Normal rate and rhythm. Normal S1 and S2. No  click or rub. Grade 1 systolic murmur ; S4 gallop. Abdomen: Bowel sounds normal; abdomen soft and nontender. No masses, organomegaly or hernias noted. No hepatojugular reflux           Musculoskeletal/extremities: No clubbing, cyanosis or significant extremity  deformity noted. Trace edemaJoints normal / reveal mild  DJD DIP changes. Nail health good.  Vascular: Carotid, radial artery, dorsalis pedis and  posterior tibial pulses are full and equal. No bruits present. Homans sign negative bilaterally Neurologic: Alert and oriented x3.         Skin: Intact without suspicious lesions or rashes. Faint erythema of shins Lymph: No cervical, axillary lymphadenopathy present. Psych: Mood and affect are normal. Normally interactive                                                                                      Assessment & Plan:  #1 cough and right pleuritic pain  #2 nocturnal dyspnea  #3 status post full course of generic Augmentin  #4 complicated history including vasculitis and myasthenia; chronic low-dose steroid administration  Plan: See orders and recommendations.

## 2012-04-24 NOTE — Telephone Encounter (Signed)
Per patient please call about medication: Levaquin  Per patient she is unable to take due to myasthenia gravis: patient states it has it noted as a precaution on pharmacy info and from information from [facility]. Patient states she mentioned this at OV and was told a warning did not populate but it clearly states on the pharmacy information that she should not take if diagnosis of myasthenia gravis   Patient is unable to get her emails due to trouble with [employer], please send labs via mail or call.  Side Note: Patient states it is ok to call her tomorrow with this information

## 2012-04-24 NOTE — Telephone Encounter (Signed)
Myasthenia gravis is on her problem list; the EMR did not generate contraindication for Levaquin. Pharmacopeia does not mention myasthenia as contraindication.  My concern that she's developed right lower lobe pneumonia after taking a full course of Augmentin. Please fax this note with the office notes to [facility]

## 2012-04-25 ENCOUNTER — Telehealth: Payer: Self-pay | Admitting: Internal Medicine

## 2012-04-25 ENCOUNTER — Ambulatory Visit: Payer: BC Managed Care – PPO | Admitting: Internal Medicine

## 2012-04-25 MED ORDER — CEFUROXIME AXETIL 500 MG PO TABS: 500.0000 mg | ORAL_TABLET | Freq: Two times a day (BID) | ORAL | Status: DC

## 2012-04-25 NOTE — Telephone Encounter (Signed)
Spoke with patient, patient aware of lab results, xray and indicated that she had a really bad night and would like to start on ABX asap. Patient is aware OV, labs and Xray sent to Dr.Cooney for further review then final decision to be made concerning abx

## 2012-04-25 NOTE — Telephone Encounter (Signed)
Labs already addressed by Dr.Hopper, patient already informed

## 2012-04-25 NOTE — Telephone Encounter (Signed)
Discuss with patient, Rx sent. 

## 2012-04-25 NOTE — Telephone Encounter (Signed)
Hold Levaquin; start Cefuroxime 500 mg bid#14

## 2012-04-25 NOTE — Telephone Encounter (Signed)
[facility] Triage Call Report Triage Record Num: [id number] Operator: Albertine Grates Patient Name: Kimberly [name] Call Date & Time: 04/24/2012 8:44:12PM Patient Phone: [phone] PCP: Marga Melnick Patient Gender: Female PCP Fax : [phone] Patient DOB: 28-Aug-1945 Practice Name: [facility] Reason for Call: Caller: Montel Clock; PCP: Marga Melnick; CB#: [phone]; [facility] calling and states patient had stat d dimer and ck drawn 4-14. D dimer is 2.38/high. CK total/39 and ck-mb 1.1. Troponin 0.01. Dr. Yetta Barre notified. No additional orders. Protocol(s) Used: Office Note Recommended Outcome per Protocol: Information Noted and Sent to Office Reason for Outcome: Caller information to office Care Advice: ~

## 2012-05-01 ENCOUNTER — Telehealth: Payer: Self-pay | Admitting: Internal Medicine

## 2012-05-01 ENCOUNTER — Encounter: Payer: Self-pay | Admitting: Internal Medicine

## 2012-05-01 DIAGNOSIS — R9389 Abnormal findings on diagnostic imaging of other specified body structures: Secondary | ICD-10-CM

## 2012-05-01 NOTE — Telephone Encounter (Signed)
Patient Information:  Caller Name: Kimberly [name]  Phone: [phone]  Patient: Kimberly [name], Kimberly [name]  Gender: Female  DOB: 1945/10/12  Age: 67 Years  PCP: Marga Melnick  Office Follow Up:  Does the office need to follow up with this patient?: Yes  Instructions For The Office: Pt calling about F/U plan and wnat s to address some of her abnormal lab work with Dr.  Does she need another visit?  Please call her to advise.   Symptoms  Reason For Call & Symptoms: Pt/Carlean was seen last week and had pneumonia with plueral effusion.  She had talked with [facility] about  followup visit but never finalized any plans or made an appt.  Nothing in EPIC note 04/24/12 to indicate F/U plan.  Pt has no worsening symptoms.  Is asking does she need to be seen again and she would like to address some of her abnormal lab work with Dr.   Reviewed Health History In EMR: Yes  Reviewed Medications In EMR: Yes  Reviewed Allergies In EMR: Yes  Reviewed Surgeries / Procedures: Yes  Date of Onset of Symptoms: 04/24/2012  Guideline(s) Used:  No Protocol Available - Information Only  Disposition Per Guideline:   Discuss with PCP and Callback by Nurse Today  Reason For Disposition Reached:   Nursing judgment  Advice Given:  Call Back If:  New symptoms develop  You become worse.  Patient Will Follow Care Advice:  YES

## 2012-05-01 NOTE — Telephone Encounter (Signed)
Message sent to patient via Mychart to forward her questions through mychart and Dr.Hopper will reply directly back to patient

## 2012-05-02 NOTE — Telephone Encounter (Signed)
Patient states she needs to go to get repeat chest xray tomorrow. Can you place orders for [facility]?

## 2012-05-02 NOTE — Telephone Encounter (Signed)
Order placed

## 2012-05-03 ENCOUNTER — Ambulatory Visit (INDEPENDENT_AMBULATORY_CARE_PROVIDER_SITE_OTHER)
Admission: RE | Admit: 2012-05-03 | Discharge: 2012-05-03 | Disposition: A | Discharge status: Home / Self Care | Payer: BC Managed Care – PPO | Source: Ambulatory Visit | Attending: Internal Medicine | Admitting: Internal Medicine

## 2012-05-03 DIAGNOSIS — R9389 Abnormal findings on diagnostic imaging of other specified body structures: Secondary | ICD-10-CM

## 2012-05-04 ENCOUNTER — Encounter: Payer: Self-pay | Admitting: Lab

## 2012-05-05 ENCOUNTER — Ambulatory Visit (INDEPENDENT_AMBULATORY_CARE_PROVIDER_SITE_OTHER): Payer: BC Managed Care – PPO | Admitting: Internal Medicine

## 2012-05-05 ENCOUNTER — Encounter: Payer: Self-pay | Admitting: Internal Medicine

## 2012-05-05 VITALS — BP 142/80 | HR 91 | Temp 98.1°F | Resp 16 | Wt 301.4 lb

## 2012-05-05 DIAGNOSIS — R609 Edema, unspecified: Secondary | ICD-10-CM

## 2012-05-05 DIAGNOSIS — R600 Localized edema: Secondary | ICD-10-CM

## 2012-05-05 DIAGNOSIS — J90 Pleural effusion, not elsewhere classified: Secondary | ICD-10-CM

## 2012-05-05 DIAGNOSIS — R894 Abnormal immunological findings in specimens from other organs, systems and tissues: Secondary | ICD-10-CM

## 2012-05-05 DIAGNOSIS — R768 Other specified abnormal immunological findings in serum: Secondary | ICD-10-CM

## 2012-05-05 LAB — CBC WITH DIFFERENTIAL/PLATELET
Basophils Relative: 0.3 % (ref 0.0–3.0)
Eosinophils Absolute: 0.3 10*3/uL (ref 0.0–0.7)
Eosinophils Relative: 1.7 % (ref 0.0–5.0)
Hemoglobin: 12.1 g/dL (ref 12.0–15.0)
Lymphocytes Relative: 8.2 % — ABNORMAL LOW (ref 12.0–46.0)
MCHC: 32.8 g/dL (ref 30.0–36.0)
MCV: 80.5 fl (ref 78.0–100.0)
Monocytes Absolute: 0.8 10*3/uL (ref 0.1–1.0)
Neutro Abs: 12.8 10*3/uL — ABNORMAL HIGH (ref 1.4–7.7)
RBC: 4.58 Mil/uL (ref 3.87–5.11)

## 2012-05-05 LAB — D-DIMER, QUANTITATIVE: D-Dimer, Quant: 6.92 ug/mL-FEU — ABNORMAL HIGH (ref 0.00–0.48)

## 2012-05-05 LAB — BASIC METABOLIC PANEL
CO2: 29 mEq/L (ref 19–32)
Chloride: 99 mEq/L (ref 96–112)
Sodium: 140 mEq/L (ref 135–145)

## 2012-05-05 LAB — BRAIN NATRIURETIC PEPTIDE: Pro B Natriuretic peptide (BNP): 64 pg/mL (ref 0.0–100.0)

## 2012-05-05 NOTE — Patient Instructions (Addendum)
Please  blowup at least 10  balloons a day to enhance inflation of the lungs and prevent atelectasis as we discussed. 

## 2012-05-05 NOTE — Progress Notes (Signed)
  Subjective:    Patient ID: Kimberly [name], female    DOB: Feb 03, 1945, 67 y.o.   MRN: [id number]  HPI   Clinical picture has changed somewhat after taking the full course of cefuroxime. Her cough is now dry; the pleuritic pain has improved to the point where she can now lie supine. Symptoms which have increased include the shortness of breath and pedal edema. She is unable to wear her shoes and must wear her husband's. She continues to have some chills.  Levaquin could not be taken because of her myasthenia gravis. Interesting this did not show up when the order for Levaquin was written as a contraindication despite myasthenia being listed on her problem list.  The actual films 4/14-4/23 were reviewed with her & her husband. There is no definite infiltrate and atelectasis appears to be more likely than pneumonia. There is increasing effusion present on the right.  Her d-dimer had been elevated but this was not unexpected in view of the initial x-ray suggesting pneumonia.    Review of Systems   She is not having fever or sweats. She specifically denies any sputum production or hemoptysis.     Objective:   Physical Exam Appears  Adequately nourished & in no acute distress  No carotid bruits are present.No neck pain distention present at 10 - 15 degrees.  Heart rhythm and rate are normal with no significant murmurs or gallops. S4  Chest is clear with no increased work of breathing. Breath sounds are decreased at the right lower lobe posteriorly. No pectoriloquy present  There is no evidence of aortic aneurysm or renal artery bruits  Abdomen soft with no organomegaly or masses. No HJR. Protuberant  No clubbing or cyanosis . Pedal pulses are decreased due to dramatic pedal edema especially over the dorsum of the feet. There is tense edema of the lower extremities.  No definite Homans sign is present but she has tenderness with compression of the calves which she relates to the  edema  No ischemic skin changes are present . Nails healthy No cervical or axillary LA  Alert and oriented.           Assessment & Plan:  #1 pleuritic pain in the context of right lower lobe pneumonia versus atelectasis. She initially had fever and purulent sputum; these have resolved. The effusion has increased. The pleuritic pain appears somewhat improved.  #2 progressive edema; this may be related to low protein stores. Clinically there is no sign of congestive heart failure  #3 PMH ANA   #4 Myasthenia gravis  Plan: The d-dimer will be repeated; CT angiogram may be necessary for definite diagnosis. The right pleural fluid may need to be tapped again for diagnostic purposes.

## 2012-05-07 ENCOUNTER — Encounter: Payer: Self-pay | Admitting: Internal Medicine

## 2012-05-07 ENCOUNTER — Other Ambulatory Visit: Payer: Self-pay | Admitting: Internal Medicine

## 2012-05-07 DIAGNOSIS — J90 Pleural effusion, not elsewhere classified: Secondary | ICD-10-CM

## 2012-05-07 DIAGNOSIS — R7989 Other specified abnormal findings of blood chemistry: Secondary | ICD-10-CM

## 2012-05-07 DIAGNOSIS — R0781 Pleurodynia: Secondary | ICD-10-CM

## 2012-05-08 ENCOUNTER — Telehealth: Payer: Self-pay | Admitting: Internal Medicine

## 2012-05-08 ENCOUNTER — Ambulatory Visit (INDEPENDENT_AMBULATORY_CARE_PROVIDER_SITE_OTHER)
Admission: RE | Admit: 2012-05-08 | Discharge: 2012-05-08 | Disposition: A | Discharge status: Home / Self Care | Payer: BC Managed Care – PPO | Source: Ambulatory Visit | Attending: Internal Medicine | Admitting: Internal Medicine

## 2012-05-08 DIAGNOSIS — R0781 Pleurodynia: Secondary | ICD-10-CM

## 2012-05-08 DIAGNOSIS — J90 Pleural effusion, not elsewhere classified: Secondary | ICD-10-CM

## 2012-05-08 DIAGNOSIS — R7989 Other specified abnormal findings of blood chemistry: Secondary | ICD-10-CM

## 2012-05-08 DIAGNOSIS — R791 Abnormal coagulation profile: Secondary | ICD-10-CM

## 2012-05-08 DIAGNOSIS — R071 Chest pain on breathing: Secondary | ICD-10-CM

## 2012-05-08 MED ORDER — IOHEXOL 350 MG/ML SOLN
80.0000 mL | Freq: Once | INTRAVENOUS | Status: AC | PRN
  Administered 2012-05-08: 80 mL via INTRAVENOUS

## 2012-05-08 NOTE — Telephone Encounter (Signed)
[facility] Triage Call Report Triage Record Num: [id number] Operator: Hyman Bower Patient Name: Kimberly [name] Call Date & Time: 05/06/2012 4:40:02PM Patient Phone: [phone] PCP: Marga Melnick Patient Gender: Female PCP Fax : [phone] Patient DOB: 1945/06/20 Practice Name: [facility] Reason for Call: Caller: [name]; PCP: Marga Melnick; CB#: [phone]; Call regarding [name] is calling from [employer] regarding a D-dimer ordered on [name], [name] by Marga Melnick.; D-dimer 6.92 (0-0.48) alert high drawn 05/05/12 at 9:05am. Previous D-dimer results: 04/24/12 - 2.38. Reviewed chart in EPIC, Dr. Alwyn Ren mentioned increased D-dimer results in the notes from the office visit when lab was drawn. Protocol(s) Used: Office Note Recommended Outcome per Protocol: Information Noted and Sent to Office Reason for Outcome: Caller information to office Care Advice: ~ 04/

## 2012-05-08 NOTE — Telephone Encounter (Signed)
Noted, this was addressed through Mychart

## 2012-05-09 ENCOUNTER — Encounter: Payer: Self-pay | Admitting: Internal Medicine

## 2012-05-10 ENCOUNTER — Encounter: Payer: Self-pay | Admitting: Pulmonary Disease

## 2012-05-10 ENCOUNTER — Ambulatory Visit (INDEPENDENT_AMBULATORY_CARE_PROVIDER_SITE_OTHER): Payer: BC Managed Care – PPO | Admitting: Pulmonary Disease

## 2012-05-10 VITALS — BP 132/78 | HR 89 | Temp 99.8°F | Ht 63.0 in | Wt 304.0 lb

## 2012-05-10 DIAGNOSIS — J90 Pleural effusion, not elsewhere classified: Secondary | ICD-10-CM

## 2012-05-10 NOTE — Patient Instructions (Addendum)
Will schedule for a thoracentesis (fluid removal from chest), and send for various tests. Will call you once results are available.

## 2012-05-10 NOTE — Assessment & Plan Note (Signed)
The pt has a moderate right effusion that is partially loculated, and based on her history is probably parapneumonic in nature.  She will need a thoracentesis to try and drain as much as possible, and also for analysis. I am concerned about the loculation, and may ultimately need VATS if we are not able to adequately drain her pleural space.

## 2012-05-10 NOTE — Progress Notes (Signed)
  Subjective:    Patient ID: Kimberly [name], female    DOB: 08/22/1945, 67 y.o.   MRN: [id number]  HPI The patient is a 67 year old female who I've been asked to see for right pleural effusion.  She has a history of myasthenia gravis for which she is on chronic prednisone, and also what sounds like a history of leukocytoclastic vasculitis.  The patient was in her usual state of health until the end of March, when she began to notice cough with purulent mucus, as well as shortness of breath and low-grade fever.  She had a chest x-ray that showed primarily bronchitic changes, and was placed on Augmentin for a possible sinobronchitis.  She felt significantly better, however her cough never resolved and she continued to have purulent mucus.  After coming off antibiotics, she began to get worse, and knows the onset of severe right-sided chest pain that was clearly pleuritic in nature.  She could not sleep lying down, and worsened with taking a deep breath.  She was continuing to have chills during this time, and ultimately was treated with another round of antibiotics.  She had a followup chest x-ray that showed a developing right lower lobe infiltrate, along with a small effusion.  This was repeated, and showed an enlarging effusion.  Subsequently, she had a CT of her chest which showed a moderately sized right effusion that was partially loculated and tracked into the right major fissure.  There was also associated compressive atelectasis.   Review of Systems  Constitutional: Positive for fever, appetite change and unexpected weight change.  HENT: Negative for ear pain, nosebleeds, congestion, sore throat, rhinorrhea, sneezing, trouble swallowing, dental problem, postnasal drip and sinus pressure.   Eyes: Negative for redness and itching.  Respiratory: Positive for cough ( dry cough now), chest tightness and shortness of breath. Negative for wheezing.   Cardiovascular: Positive for chest pain,  palpitations and leg swelling.  Gastrointestinal: Negative for nausea and vomiting.  Genitourinary: Negative for dysuria.  Musculoskeletal: Positive for joint swelling.  Skin: Negative for rash.  Neurological: Negative for headaches.  Hematological: Does not bruise/bleed easily.  Psychiatric/Behavioral: Positive for dysphoric mood. The patient is nervous/anxious.        Objective:   Physical Exam Constitutional:  Morbidly obese, no acute distress  HENT:  Nares patent without discharge  Oropharynx without exudate, palate and uvula are normal  Eyes:  Perrla, eomi, no scleral icterus  Neck:  No JVD, no TMG  Cardiovascular:  Normal rate, regular rhythm, no rubs or gallops.  No murmurs        Intact distal pulses  Pulmonary :  Decreased bs right base, no stridor or respiratory distress   No  rhonchi, or wheezing, mild crackles right base.   Abdominal:  Soft, nondistended, bowel sounds present.  No tenderness noted.   Musculoskeletal:  2+ lower extremity edema noted.  Lymph Nodes:  No cervical lymphadenopathy noted  Skin:  No cyanosis noted  Neurologic:  Alert, appropriate, moves all 4 extremities without obvious deficit.         Assessment & Plan:

## 2012-05-11 ENCOUNTER — Ambulatory Visit (HOSPITAL_COMMUNITY)
Admission: RE | Admit: 2012-05-11 | Discharge: 2012-05-11 | Disposition: A | Discharge status: Home / Self Care | Payer: BC Managed Care – PPO | Source: Ambulatory Visit | Attending: Radiology | Admitting: Radiology

## 2012-05-11 ENCOUNTER — Ambulatory Visit (HOSPITAL_COMMUNITY)
Admission: RE | Admit: 2012-05-11 | Discharge: 2012-05-11 | Disposition: A | Discharge status: Home / Self Care | Payer: BC Managed Care – PPO | Source: Ambulatory Visit | Attending: Pulmonary Disease | Admitting: Pulmonary Disease

## 2012-05-11 ENCOUNTER — Other Ambulatory Visit: Payer: Self-pay | Admitting: Pulmonary Disease

## 2012-05-11 DIAGNOSIS — J90 Pleural effusion, not elsewhere classified: Secondary | ICD-10-CM

## 2012-05-11 LAB — GLUCOSE, SEROUS FLUID

## 2012-05-11 LAB — BODY FLUID CELL COUNT WITH DIFFERENTIAL
Lymphs, Fluid: 61 %
Monocyte-Macrophage-Serous Fluid: 11 % — ABNORMAL LOW (ref 50–90)

## 2012-05-11 LAB — PROTEIN, BODY FLUID

## 2012-05-11 NOTE — Procedures (Signed)
Successful US guided right thoracentesis. Yielded of blood tinged fluid. Pt tolerated procedure well. No immediate complications.  Specimen was sent for labs. CXR ordered.  Brayton El PA-C 05/11/2012 1:37 PM

## 2012-05-14 LAB — BODY FLUID CULTURE: Culture: NO GROWTH

## 2012-05-15 ENCOUNTER — Other Ambulatory Visit: Payer: Self-pay | Admitting: Pulmonary Disease

## 2012-05-15 ENCOUNTER — Telehealth: Payer: Self-pay | Admitting: *Deleted

## 2012-05-15 DIAGNOSIS — J90 Pleural effusion, not elsewhere classified: Secondary | ICD-10-CM

## 2012-05-15 NOTE — Telephone Encounter (Signed)
Pt scheduled with CT Wednesday 05/17/12.  Pt aware of results per KC--expressed understanding.  Nothing further needed at this time.

## 2012-05-15 NOTE — Telephone Encounter (Signed)
Pt called and reported seeing lab results for the Final Cytology labs for her thoracentesis on MyChart. Pt states that she is very concerned with the numbers on these labs and would like to speak with [facility] personally to have this explained to her. Pt states that she has seen the numbers and got curious and looked in the Internet in regards to some of the labs and she read about "malignancy" being a factor. Pt would like to know if she has cancer or is it is a possibility.   Please advise Dr Shelle Iron. Thanks.

## 2012-05-15 NOTE — Telephone Encounter (Signed)
Let pt know there is absolutely NOTHING on her fluid AT THIS TIME, to suggest cancer.  We need to do ct chest to see how much of the fluid has cleared out, and I will call her once the ct is done.  I have sent order to [facility]cc.

## 2012-05-17 ENCOUNTER — Ambulatory Visit (INDEPENDENT_AMBULATORY_CARE_PROVIDER_SITE_OTHER)
Admission: RE | Admit: 2012-05-17 | Discharge: 2012-05-17 | Disposition: A | Discharge status: Home / Self Care | Payer: BC Managed Care – PPO | Source: Ambulatory Visit | Attending: Pulmonary Disease | Admitting: Pulmonary Disease

## 2012-05-17 ENCOUNTER — Other Ambulatory Visit: Payer: Self-pay | Admitting: Pulmonary Disease

## 2012-05-17 DIAGNOSIS — J90 Pleural effusion, not elsewhere classified: Secondary | ICD-10-CM

## 2012-05-23 ENCOUNTER — Other Ambulatory Visit: Payer: Self-pay | Admitting: Pulmonary Disease

## 2012-05-23 ENCOUNTER — Ambulatory Visit
Admission: RE | Admit: 2012-05-23 | Discharge: 2012-05-23 | Disposition: A | Discharge status: Home / Self Care | Payer: BC Managed Care – PPO | Source: Ambulatory Visit | Attending: Thoracic Surgery (Cardiothoracic Vascular Surgery) | Admitting: Thoracic Surgery (Cardiothoracic Vascular Surgery)

## 2012-05-23 ENCOUNTER — Institutional Professional Consult (permissible substitution) (INDEPENDENT_AMBULATORY_CARE_PROVIDER_SITE_OTHER): Payer: BC Managed Care – PPO | Admitting: Thoracic Surgery (Cardiothoracic Vascular Surgery)

## 2012-05-23 ENCOUNTER — Telehealth: Payer: Self-pay | Admitting: Pulmonary Disease

## 2012-05-23 ENCOUNTER — Other Ambulatory Visit: Payer: Self-pay

## 2012-05-23 ENCOUNTER — Encounter: Payer: Self-pay | Admitting: Thoracic Surgery (Cardiothoracic Vascular Surgery)

## 2012-05-23 VITALS — BP 169/89 | HR 89 | Resp 18 | Ht 63.5 in | Wt 301.0 lb

## 2012-05-23 DIAGNOSIS — J90 Pleural effusion, not elsewhere classified: Secondary | ICD-10-CM

## 2012-05-23 NOTE — Progress Notes (Signed)
PCP is Marga Melnick, MD Referring Provider is Clance, Maree Krabbe, MD  Chief Complaint  Patient presents with  . Pleural Effusion    loculated on the right...CT CHEST 05/17/12    HPI: 67 year old woman presents with chief complaint of a pleural effusion.  Kimberly [name] is a 67 year old woman with a history of myasthenia gravis and vasculitis. She was doing well until March of this year when she developed bronchitis. She was treated for that but had increasing shortness of breath and chest discomfort. She describes 2 different types of chest pain including a pleuritic pain with coughing or deep breath on the right side. She also says that she constantly feels a tightness in the middle of her chest. This is present all the time but does get worse with exertion.   She had a CT scan on April 28 which showed a small loculated right pleural effusion. She underwent thoracentesis on May 1. 300 mL of fluid was withdrawn. Cytology was negative. She did have an elevated LDH of 336. Glucose was 98. The white blood cell count was 1850. She had a repeat CT on 05/17/2012. It showed the effusion was still present, but it was smaller.  She been seen and treated by Dr. Alwyn Ren and Dr. Shelle Iron. She was treated with an increase in her steroids from 10 mg daily to 20 mg daily on 2 occasions, once when she was first diagnosed with bronchitis and secondly after thoracentesis on the first. She changed the dose back to 10 mg a day on her own.    She was disappointed that she did not experience more immediate relief after the thoracentesis. She says that her symptoms have gotten slightly better, but not significantly so, and they certainly have not resolved.  Past Medical History  Diagnosis Date  . Myasthenia gravis without exacerbation     [facility], Dr Fidela Juneau  . Diplopia   . Family history of ischemic heart disease   . Unspecified vitamin D deficiency     Dr Corliss Skains  . Other and unspecified hyperlipidemia   .  Vasculitis     Dr Corliss Skains  . Myalgia and myositis, unspecified   . GERD (gastroesophageal reflux disease)   . Psoriasis     plantar ;Dr Donzetta Starch    Past Surgical History  Procedure Laterality Date  . Appendectomy      & exploratory for inflammation; Dr Daphine Deutscher  . Total abdominal hysterectomy w/ bilateral salpingoophorectomy      For Fibroids & endometriosis  . Cholecystectomy      for stones  . Shoulder surgery      Left & Right  . Thoracentesis      for peripneumonic effusion  . Laminectomy  2010    T1-2; Dr Venetia Maxon  . Cervical fusion      C5-6; Dr Venetia Maxon    Family History  Problem Relation Age of Onset  . Hypertension Sister   . Hyperthyroidism Sister     Gra[place]  . Heart attack Mother 32  . Heart failure Mother   . Stroke Paternal Grandmother     in 73s  . Lung cancer Father   . Diabetes Neg Hx   . Hyperthyroidism Sister     Graves    Social History History  Substance Use Topics  . Smoking status: Never Smoker   . Smokeless tobacco: Not on file  . Alcohol Use: No    Current Outpatient Prescriptions  Medication Sig Dispense Refill  . augmented betamethasone dipropionate (DIPROLENE-AF)  0.05 % ointment Apply 1 application topically daily.       . Calcium Carbonate-Vit D-Min (CALCIUM 1200 PO) Take by mouth daily.        . furosemide (LASIX) 40 MG tablet Take 1 tablet (40 mg total) by mouth daily.  90 tablet  3  . Multiple Vitamins-Minerals (CENTRUM SILVER PO) Take by mouth daily.        Marland Kitchen omeprazole (PRILOSEC OTC) 20 MG tablet Take 20 mg by mouth as needed.      . predniSONE (DELTASONE) 10 MG tablet Take 10 mg by mouth daily.        Marland Kitchen pyridostigmine (MESTINON) 60 MG tablet Take 60 mg by mouth 3 (three) times daily.        . Pyridoxine HCl (VITAMIN B-6 CR) 200 MG TBCR Take by mouth daily.        . valsartan (DIOVAN) 320 MG tablet Take 1 tablet (320 mg total) by mouth daily.  90 tablet  3  . Zinc 50 MG CAPS Take by mouth daily.         No current  facility-administered medications for this visit.    Allergies  Allergen Reactions  . Azithromycin     Angioedema  . Levaquin (Levofloxacin In D5w) Other (See Comments)    D/T MYASTHENIA GRAVIS  . Tetracycline     Rash  . Sulfonamide Derivatives     Seizure age 25 (Note: probably fever induced)  . Amlodipine Besy-Benazepril Hcl     REACTION: cough    Review of Systems  Constitutional: Positive for activity change and unexpected weight change (Weight gain due to increased prednisone dose). Negative for fever and chills.  Respiratory: Positive for cough (nonproductive), chest tightness, shortness of breath and wheezing.   Cardiovascular: Positive for chest pain (Tightness in the chest all the time worse with exertion , also  pleuritic pain with cough) and leg swelling.  Gastrointestinal:       Heartburn  Musculoskeletal: Positive for myalgias.       Weakness in proximal muscles due to myasthenia gravis    BP 169/89  Pulse 89  Resp 18  Ht 5' 3.5" (1.613 m)  Wt 301 lb (136.533 kg)  BMI 52.48 kg/m2  SpO2 98% Physical Exam  Vitals reviewed. Constitutional: She is oriented to person, place, and time. No distress.  Morbidly obese  HENT:  Head: Normocephalic and atraumatic.  Eyes: EOM are normal. Pupils are equal, round, and reactive to light.  Neck: Neck supple. No thyromegaly present.  Cardiovascular: Normal rate, regular rhythm and normal heart sounds.   Pulmonary/Chest: Effort normal. She has no wheezes. She has no rales.  Equal but diminished breath sounds bilaterally  Abdominal: Soft. There is no tenderness.  Lymphadenopathy:    She has no cervical adenopathy.  Neurological: She is alert and oriented to person, place, and time.  Skin: Skin is warm and dry.     Diagnostic Tests: CT of chest 05/17/2012 Clinical Data: Follow up pleural effusion following thoracentesis.  Persistent shortness of breath.  CT CHEST WITHOUT CONTRAST  Technique: Multidetector CT imaging  of the chest was performed  following the standard protocol without IV contrast.  Comparison: 05/08/12  Findings: There is a partially loculated right pleural effusion  which is slightly decreased in volume from previous exam.  Compressive type atelectasis is identified within the right lower  lobe. No left pleural effusion identified. Left lung is clear.  The trachea appears patent and is midline. No endobronchial lesion  identified.  Heart size appears normal. No mediastinal or hilar adenopathy  identified.  Imaging through the upper abdomen is remarkable for cholecystectomy  clips. The adrenal glands appear normal. No acute findings  identified.  No aggressive lytic or sclerotic bone lesions. There is multilevel  spondylosis identified within the thoracic spine.  IMPRESSION:  1. Partially loculated right pleural effusion is slightly  decreased in volume from previous exam.  2. No pulmonary nodule or mass identified.  Original Report Authenticated By: Signa Kell, M.D.  Impression:  67 year old woman with a inflammatory right pleural effusion. This almost certainly is a parapneumonic effusion given her history. She had thoracentesis about 12 days ago which removed the majority of the fluid (only 300 mL's). A followup CT on 05/17/2012 and showed a small residual pleural effusion.  In my opinion the amount fluid present on the CT from 05/17/2012 is not enough to cause symptomatic shortness of breath. I also discussed with her and her husband that the fluid itself is not causing her pain but is indicative of the underlying inflammation which the source of the pain. I would not recommend surgery based on the findings of the CT on 05/17/2012. I recommended that we repeat a chest x-ray today just to make sure that it has not been a significant recurrence of the effusion.  Plan:  Repeat chest x-ray today.  Addendum:  PA and lateral chest x-ray was done today. It showed a persistent  very small right pleural effusion. This may be minimally larger than the residual effusion on 05/11/2012 after thoracentesis. But it is very difficult to tell without lateral film having been done at that time.  Recommendation:  No indication for surgery.  I did recommend that she go back onto the higher dose of prednisone 20 mg daily.  She should call Dr. Teddy Spike office to schedule a followup appointment in 6-8 weeks

## 2012-05-23 NOTE — Telephone Encounter (Signed)
Yes she may return back to work--no restrictions.

## 2012-05-23 NOTE — Telephone Encounter (Signed)
Spoke with patient, made her aware of recs per [facility] as below Patient verbalized understanding and nothing further needed at this time

## 2012-05-23 NOTE — Telephone Encounter (Signed)
Spoke with patient-- She states she originally was taken oput of work by Dr. Alwyn Ren, then she spoke with Dr. Shelle Iron about returning to work and he told her to wait until after thoracentesis and visit w Dr. Dorris Fetch Patient states she saw Dr. Dorris Fetch today and he did not address her work restriction/release. Patient wold like to know can she go back to work or does she still need to be out? Dr.Clance please advise, thank you!

## 2012-06-01 ENCOUNTER — Encounter: Payer: Self-pay | Admitting: Pulmonary Disease

## 2012-06-01 ENCOUNTER — Ambulatory Visit (INDEPENDENT_AMBULATORY_CARE_PROVIDER_SITE_OTHER): Payer: BC Managed Care – PPO | Admitting: Pulmonary Disease

## 2012-06-01 VITALS — BP 138/82 | HR 77 | Temp 97.6°F | Ht 62.75 in | Wt 301.0 lb

## 2012-06-01 DIAGNOSIS — J90 Pleural effusion, not elsewhere classified: Secondary | ICD-10-CM

## 2012-06-01 NOTE — Assessment & Plan Note (Signed)
The pt has been seen by T-surg for her loculated pleural effusion with secondary atelectasis, and they feel that we should give this a little more time for resolution.  The patient thinks her pain has gotten a little better since the last visit, but she does still have dyspnea on exertion that is worse than her usual baseline.  She has just gotten over a pneumonia, and I think she needs to give this a little more time as well.  I would like to see her back in about 4 weeks with a followup chest x-ray, and we'll proceed from there.  She is to call me if she has increased pain or increased shortness of breath.

## 2012-06-01 NOTE — Patient Instructions (Addendum)
Will follow your fluid for now, but please call if increased discomfort or shortness of breath.   followup with me in 4 weeks, and will do followup chest xray the same day.

## 2012-06-01 NOTE — Progress Notes (Signed)
  Subjective:    Patient ID: Kimberly [name], female    DOB: 1945-07-15, 67 y.o.   MRN: [id number]  HPI The patient comes in today for followup of her loculated pleural effusion.  She was referred to thoracic surgery for consideration of that decortication, however if they felt we should follow this for a little longer before committing her to surgery.  The patient does have some decrease in her pain, but continues to have dyspnea on exertion that is worse than her usual baseline.  She is not having any significant cough or mucus production.  She has had no fever.   Review of Systems  Constitutional: Negative for fever and unexpected weight change.  HENT: Negative for ear pain, nosebleeds, congestion, sore throat, rhinorrhea, sneezing, trouble swallowing, dental problem, postnasal drip and sinus pressure.   Eyes: Negative for redness and itching.  Respiratory: Positive for cough and shortness of breath. Negative for chest tightness and wheezing.   Cardiovascular: Positive for leg swelling. Negative for palpitations.  Gastrointestinal: Negative for nausea and vomiting.  Genitourinary: Negative for dysuria.  Musculoskeletal: Negative for joint swelling.  Skin: Negative for rash.  Neurological: Negative for headaches.  Hematological: Does not bruise/bleed easily.  Psychiatric/Behavioral: Negative for dysphoric mood. The patient is not nervous/anxious.        Objective:   Physical Exam Obese female in no acute distress Nose without purulence or discharge noted Neck without lymphadenopathy or thyromegaly Lower extremities with mild edema, no cyanosis Alert and oriented, moves all 4 extremities.       Assessment & Plan:

## 2012-06-20 ENCOUNTER — Telehealth: Payer: Self-pay | Admitting: Pulmonary Disease

## 2012-06-20 DIAGNOSIS — J90 Pleural effusion, not elsewhere classified: Secondary | ICD-10-CM

## 2012-06-20 NOTE — Telephone Encounter (Signed)
That is ok with me.  Just put order in for cxr, and I will call her.

## 2012-06-20 NOTE — Telephone Encounter (Signed)
ATC pt at work > the office is closed from 11:45am-1:15pm for lunch. ATC pt on cell number, spoke with her spouse who did not seem to be aware of her above complaints [facility]

## 2012-06-20 NOTE — Telephone Encounter (Signed)
Called spoke with patient, advised of KC's recs as stated below She will come tomorrow for cxr for f/u pleural effusion Pt aware KC will call w/ results and to call or seek emergency assistance if symptoms worsen Will sign off

## 2012-06-20 NOTE — Telephone Encounter (Signed)
I spoke with the pt and she states she was scheduled to see [facility] on 06-30-12 for a 4 week follow-up with a CXR for f/u on pleural effusion but this has been rescheduled due to cancelling office. Pt states she is concerned about waiting until next follow-up to have CXR. She states over the last 1.5 weeks she has begun to have an increase in her dry cough, especially at night, discomfort on the right side when she lays down, and raspy sound when she lays down. Pt states she has not had any sharp pains like before and does not feel like she needs to be seen asap, but does not feel like she needs to wait until [facility] next available for CXR. I offered the pt an appt with TP on the same day that she was scheduled with [facility] but the pt refused stating she only wants to see KC. I advised the pt that I feel it would be best for her to come in for OV and cxr, but again she refuses.  Pt wants to know can she come in for a cxr and have KC call her with the results before her upcoming appt. Pt states she will keep her f/u no matter what. Please advise. Carron Curie, CMA

## 2012-06-20 NOTE — Telephone Encounter (Signed)
Pt returned call. Call work [phone]. Kimberly [name]

## 2012-06-21 ENCOUNTER — Ambulatory Visit (INDEPENDENT_AMBULATORY_CARE_PROVIDER_SITE_OTHER)
Admission: RE | Admit: 2012-06-21 | Discharge: 2012-06-21 | Disposition: A | Discharge status: Home / Self Care | Payer: BC Managed Care – PPO | Source: Ambulatory Visit | Attending: Pulmonary Disease | Admitting: Pulmonary Disease

## 2012-06-21 DIAGNOSIS — J90 Pleural effusion, not elsewhere classified: Secondary | ICD-10-CM

## 2012-06-22 ENCOUNTER — Telehealth: Payer: Self-pay | Admitting: Pulmonary Disease

## 2012-06-22 NOTE — Telephone Encounter (Signed)
I spoke with patient about results and she verbalized understanding and had no questions 

## 2012-06-30 ENCOUNTER — Ambulatory Visit: Payer: BC Managed Care – PPO | Admitting: Pulmonary Disease

## 2012-07-07 ENCOUNTER — Ambulatory Visit (INDEPENDENT_AMBULATORY_CARE_PROVIDER_SITE_OTHER): Payer: BC Managed Care – PPO | Admitting: Pulmonary Disease

## 2012-07-07 ENCOUNTER — Encounter: Payer: Self-pay | Admitting: Pulmonary Disease

## 2012-07-07 VITALS — BP 118/86 | HR 75 | Temp 97.8°F | Ht 62.0 in | Wt 300.4 lb

## 2012-07-07 DIAGNOSIS — J90 Pleural effusion, not elsewhere classified: Secondary | ICD-10-CM

## 2012-07-07 NOTE — Progress Notes (Signed)
  Subjective:    Patient ID: Kimberly [name], female    DOB: 22-Jan-1945, 67 y.o.   MRN: [id number]  HPI The patient comes in today for followup of her right pleural effusion.  It has been loculated with some right lower lobe atelectasis, and his culture to have pleuritic pain.  She was referred to a chest surgeon, and after discussion, it was decided to see how things progressed.  The patient has very little pain at this point, and feels like she is breathing better on that side.  She still has dyspnea on exertion, but I suspect this is more related to her morbid obesity and anything else.  She did have a chest x-ray 2 weeks ago that showed continued improvement in her right effusion.   Review of Systems  Constitutional: Negative for fever and unexpected weight change.  HENT: Negative for ear pain, nosebleeds, congestion, sore throat, rhinorrhea, sneezing, trouble swallowing, dental problem, postnasal drip and sinus pressure.   Eyes: Negative for redness and itching.  Respiratory: Negative for cough, chest tightness, shortness of breath and wheezing.   Cardiovascular: Negative for palpitations and leg swelling.  Gastrointestinal: Negative for nausea and vomiting.  Genitourinary: Negative for dysuria.  Musculoskeletal: Negative for joint swelling.  Skin: Negative for rash.  Neurological: Negative for headaches.  Hematological: Does not bruise/bleed easily.  Psychiatric/Behavioral: Negative for dysphoric mood. The patient is not nervous/anxious.        Objective:   Physical Exam Morbidly obese female in no acute distress Nose without purulence or discharge noted Neck without lymphadenopathy or thyromegaly Chest with mild decrease in breath sounds in the right base, otherwise clear Heart exam with regular rate and rhythm, 2/6 systolic murmur Lower extremities with 3+ edema, no cyanosis Alert and oriented, moves all 4 extremities.       Assessment & Plan:

## 2012-07-07 NOTE — Patient Instructions (Addendum)
Will see you back in 4mos with chest xray the same day. Please call if you have worsening shortness of breath or increased pain.

## 2012-07-07 NOTE — Assessment & Plan Note (Signed)
The patient feels that she is doing better overall, with significantly improved right-sided discomfort, and also improved breathing.  A followup chest x-ray 2 weeks ago showed further decrease in her small effusion.  I've explained to her the only reason to intervene at this point is because of severe pain or worsening shortness of breath that is directly attributable to the right effusion.  Since she is doing so well, I would like to see her back in approximately 4 months for a followup chest x-ray.

## 2012-08-16 ENCOUNTER — Other Ambulatory Visit: Payer: Self-pay

## 2012-08-22 ENCOUNTER — Telehealth: Payer: Self-pay | Admitting: Pulmonary Disease

## 2012-08-22 NOTE — Telephone Encounter (Signed)
Does not need

## 2012-08-22 NOTE — Telephone Encounter (Signed)
Per OV 07/07/12: Patient Instructions    Will see you back in 4mos with chest xray the same day.  Please call if you have worsening shortness of breath or increased pain.   Per CXR 06/22/12: Notes Recorded by Barbaraann Share, MD on 06/21/2012 at 7:08 PM Please let pt know that her cxr shows her fluid level is even lower than last film. Minimal overall. Good news! If she is still having symptoms and wants to be seen sooner, then work in with TP.  --Pt had order in [facility] for CT that was ordered back in may. Pt is scheduled for CT 08/25/12. Is this still needed KC thanks

## 2012-08-23 NOTE — Telephone Encounter (Signed)
Pt is aware that she does not need a CT. This has been canceled for her.

## 2012-08-25 ENCOUNTER — Other Ambulatory Visit: Payer: BC Managed Care – PPO

## 2012-09-18 ENCOUNTER — Encounter: Payer: Self-pay | Admitting: Pulmonary Disease

## 2012-10-11 ENCOUNTER — Encounter: Payer: Self-pay | Admitting: Internal Medicine

## 2012-10-11 ENCOUNTER — Ambulatory Visit (INDEPENDENT_AMBULATORY_CARE_PROVIDER_SITE_OTHER): Payer: BC Managed Care – PPO | Admitting: Internal Medicine

## 2012-10-11 VITALS — BP 161/71 | HR 91 | Temp 98.1°F | Resp 13 | Wt 296.8 lb

## 2012-10-11 DIAGNOSIS — K429 Umbilical hernia without obstruction or gangrene: Secondary | ICD-10-CM

## 2012-10-11 DIAGNOSIS — G7000 Myasthenia gravis without (acute) exacerbation: Secondary | ICD-10-CM

## 2012-10-11 DIAGNOSIS — I10 Essential (primary) hypertension: Secondary | ICD-10-CM

## 2012-10-11 MED ORDER — AMLODIPINE BESYLATE 2.5 MG PO TABS: 2.5000 mg | ORAL_TABLET | Freq: Every day | ORAL | Status: DC

## 2012-10-11 NOTE — Progress Notes (Signed)
  Subjective:    Patient ID: Kimberly [name], female    DOB: [birth year], 67 y.o.   MRN: [id number]  HPI   Symptoms began 6-8 weeks ago as a "sticking sensation" in her bellybutton with a visible protrusion. This is associated with some discomfort up to level [name]-4. The discomfort is nonradiating.  The only possible trigger was the coughing and "heaving" she experienced when she had pneumonia in May of this year.  She does find that coughing , laughing , waist extension or lateral thorax rotation with waist flexion  aggravate it. Additionally a large meal will aggravate symptoms as well.  The elevated blood pressure was taken at the forearm level. BP @ work 146- 148/78-82.    Review of Systems  She denies associated fever, chills, sweats, or loss of weight  She has not had nausea , vomiting, hematemesis, dysphagia, melena, rectal bleeding. There's been no changes in the bowels.  Additionally she has no dysuria, pyuria, or hematuria.  Despite the protrusion she has not noticed any change in color or temperature in this area or a rash.  The discomfort at the umbilicus is not related to any back pain with anterior radiation.     Objective:   Physical Exam General appearance : adequate  nourishment ;w/o distress. Weight excess  Eyes: No conjunctival inflammation or scleral icterus is present.  Oral exam: Dental hygiene is good; lips and gums are healthy appearing.There is mild oropharyngeal erythema & oropharyngeal crowding.   Heart:  Normal rate and regular rhythm. S1 and S2 normal without gallop, murmur, click, or rub S4 with slurring at  LSB   Lungs:Chest clear to auscultation; no wheezes, rhonchi,rales ,or rubs present.No increased work of breathing.   Abdomen: bowel sounds decreased but present. Dullness to percussion left upper quadrant without definite organomegaly. Nonreducible umbilical hernia present .  No guarding or rebound   Skin:Warm & dry.  Intact without  suspicious lesions or rashes ; no jaundice   Lymphatic: No lymphadenopathy is noted about the head, neck, or axilla.             Assessment & Plan:  #1 umbilical hernia  #2 myasthenia gravis #3 HTN  Plan consult Dr. Wenda Low as to optimal care

## 2012-10-11 NOTE — Patient Instructions (Addendum)

## 2012-10-17 ENCOUNTER — Ambulatory Visit (INDEPENDENT_AMBULATORY_CARE_PROVIDER_SITE_OTHER): Payer: BC Managed Care – PPO | Admitting: Surgery

## 2012-10-20 ENCOUNTER — Encounter (INDEPENDENT_AMBULATORY_CARE_PROVIDER_SITE_OTHER): Payer: Self-pay | Admitting: Surgery

## 2012-10-20 ENCOUNTER — Ambulatory Visit (INDEPENDENT_AMBULATORY_CARE_PROVIDER_SITE_OTHER): Payer: BC Managed Care – PPO | Admitting: Surgery

## 2012-10-20 VITALS — BP 142/80 | HR 72 | Temp 97.4°F | Resp 16 | Ht 63.0 in | Wt 290.0 lb

## 2012-10-20 DIAGNOSIS — K429 Umbilical hernia without obstruction or gangrene: Secondary | ICD-10-CM | POA: Insufficient documentation

## 2012-10-20 NOTE — Progress Notes (Signed)
Chief Complaint:  Recently enlarging umbilical hernia  History of Present Illness:  Kimberly [name] is an 67 y.o. female on whom I did an open appendectomy in 1988 and he now presents with a new onset of an umbilical hernia. She notices with a lot of coughing this past winter with a bout of pneumonia. She noticed that her innie became an outie.   Occasionally she'll have sharp pains in her umbilicus. She does have more pain when she saw him when she bends over.  Past Medical History  Diagnosis Date  . Myasthenia gravis without exacerbation     [facility], Dr Fidela Juneau  . Diplopia   . Family history of ischemic heart disease   . Unspecified vitamin D deficiency     Dr Corliss Skains  . Other and unspecified hyperlipidemia   . Vasculitis     Dr Corliss Skains  . Myalgia and myositis, unspecified   . GERD (gastroesophageal reflux disease)   . Psoriasis     plantar ;Dr Donzetta Starch  . Arthritis   . Heart murmur     Past Surgical History  Procedure Laterality Date  . Appendectomy      & exploratory for inflammation; Dr Daphine Deutscher  . Total abdominal hysterectomy w/ bilateral salpingoophorectomy      For Fibroids & endometriosis  . Cholecystectomy      for stones  . Shoulder surgery      Left & Right  . Thoracentesis      for peripneumonic effusion  . Laminectomy  2010    T1-2; Dr Venetia Maxon  . Cervical fusion      C5-6; Dr Venetia Maxon    Current Outpatient Prescriptions  Medication Sig Dispense Refill  . augmented betamethasone dipropionate (DIPROLENE-AF) 0.05 % ointment Apply 1 application topically daily.       . Calcium Carbonate-Vit D-Min (CALCIUM 1200 PO) Take by mouth daily.        . furosemide (LASIX) 40 MG tablet Take 1 tablet (40 mg total) by mouth daily.  90 tablet  3  . Multiple Vitamins-Minerals (CENTRUM SILVER PO) Take by mouth daily.        Marland Kitchen omeprazole (PRILOSEC OTC) 20 MG tablet Take 20 mg by mouth as needed.      . predniSONE (DELTASONE) 10 MG tablet Take 10 mg by mouth daily.        Marland Kitchen  pyridostigmine (MESTINON) 60 MG tablet Take 60 mg by mouth 3 (three) times daily.        . Pyridoxine HCl (VITAMIN B-6 CR) 200 MG TBCR Take by mouth daily.        . valsartan (DIOVAN) 320 MG tablet Take 1 tablet (320 mg total) by mouth daily.  90 tablet  3  . Zinc 50 MG CAPS Take by mouth daily.        Marland Kitchen amLODipine (NORVASC) 2.5 MG tablet Take 1 tablet (2.5 mg total) by mouth daily.  30 tablet  5   No current facility-administered medications for this visit.   Azithromycin; Levaquin; Tetracycline; Sulfonamide derivatives; and Amlodipine besy-benazepril hcl Family History  Problem Relation Age of Onset  . Hypertension Sister   . Hyperthyroidism Sister     Gra[place]  . Heart attack Mother 36  . Heart failure Mother   . Stroke Paternal Grandmother     in 50s  . Lung cancer Father   . Cancer Father     lung  . Diabetes Neg Hx   . Hyperthyroidism Sister     Luiz Blare  Social History:   reports that she has never smoked. She has never used smokeless tobacco. She reports that she does not drink alcohol or use illicit drugs.   REVIEW OF SYSTEMS - PERTINENT POSITIVES ONLY: Review of systems is positive for leg swelling, abdominal distention, abdominal pain, arthritis pains, weakness, and easy bruisability secondary to steroids.  Physical Exam:   Blood pressure 142/80, pulse 72, temperature 97.4 F (36.3 C), temperature source Temporal, resp. rate 16, height 5\' 3"  (1.6 m), weight 290 lb (131.543 kg). Body mass index is 51.38 kg/(m^2).  Gen:  WDWN white female NAD  Neurological: Alert and oriented to person, place, and time. Motor and sensory function is grossly intact  Head: Normocephalic and atraumatic.  Eyes: Conjunctivae are normal. Pupils are equal, round, and reactive to light. No scleral icterus.   Abdomen:  Approximately 2 cm soft mass at the base of her umbilicus consistent with a pre-peritoneal fat herniation through a small fascial defect. This was not reducible. This seems like  it likely preperitoneal fat I don't find any symptomatic or physical findings to suggest that is incarcerated bowel. Marland Kitchen   LABORATORY RESULTS: No results found for this or any previous visit (from the past 48 hour(s)).  RADIOLOGY RESULTS: No results found.  Problem List: Patient Active Problem List   Diagnosis Date Noted  . Umbilical hernia 10/20/2012  . Pleural effusion 05/10/2012  . Myasthenia gravis without exacerbation 02/12/2010  . DIPLOPIA 11/21/2009  . DISTURBANCE OF SKIN SENSATION 11/21/2009  . VITAMIN D DEFICIENCY 06/25/2008  . HYPERLIPIDEMIA 06/25/2008  . UNSPECIFIED ESSENTIAL HYPERTENSION 06/25/2008  . VASCULITIS 06/19/2007  . MUSCLE WEAKNESS (GENERALIZED) 02/10/2007  . ELEVATED SEDIMENTATION RATE 02/10/2007  . Unspecified Myalgia and Myositis 01/24/2007  . EDEMA- LOCALIZED 01/24/2007  . ANA POSITIVE, HX OF 01/24/2007    Assessment & Plan: Small umbilical hernia in a patient who is overweight and has myasthenia gravis. I told her that we could repair this but it is not bothering her that much he may be better for her to just observe this. If this becomes an encumbrance on her activities of daily living and I think we should go ahead and repair it under general anesthesia. She is aware of the risk of surgery and wants to wait and observe this for now. I will be glad to follow along and see her again as needed    Matt B. Daphine Deutscher, MD, [facility] [phone] beeper [phone]  10/20/2012 1:56 PM

## 2012-10-20 NOTE — Patient Instructions (Addendum)
I believe your umbilical hernia has an area of fatty  tissue getting pinched. This can cause some sharp pain from time to time. This may get larger with time or it could remain the same size. If you found that it had  gotten much larger and this was associated with nausea and vomiting then you  would need to be seen emergently.  Otherwise this can be observed. If it becomes more symptomatic and you  wanted it fixed I would be happy to see you and repair it.  This repair would likely be with a small mesh patch.

## 2012-11-10 ENCOUNTER — Ambulatory Visit: Payer: BC Managed Care – PPO | Admitting: Pulmonary Disease

## 2012-11-16 ENCOUNTER — Other Ambulatory Visit: Payer: Self-pay

## 2012-12-15 ENCOUNTER — Ambulatory Visit (INDEPENDENT_AMBULATORY_CARE_PROVIDER_SITE_OTHER): Payer: Medicare Other | Admitting: Pulmonary Disease

## 2012-12-15 ENCOUNTER — Encounter: Payer: Self-pay | Admitting: Pulmonary Disease

## 2012-12-15 ENCOUNTER — Ambulatory Visit (INDEPENDENT_AMBULATORY_CARE_PROVIDER_SITE_OTHER)
Admission: RE | Admit: 2012-12-15 | Discharge: 2012-12-15 | Disposition: A | Discharge status: Home / Self Care | Payer: Medicare Other | Source: Ambulatory Visit | Attending: Pulmonary Disease | Admitting: Pulmonary Disease

## 2012-12-15 VITALS — BP 136/84 | HR 93 | Temp 99.6°F | Ht 63.0 in | Wt 294.8 lb

## 2012-12-15 DIAGNOSIS — J9819 Other pulmonary collapse: Secondary | ICD-10-CM | POA: Diagnosis not present

## 2012-12-15 DIAGNOSIS — J90 Pleural effusion, not elsewhere classified: Secondary | ICD-10-CM | POA: Diagnosis not present

## 2012-12-15 MED ORDER — PREDNISONE 10 MG PO TABS: ORAL_TABLET | ORAL | Status: DC

## 2012-12-15 NOTE — Patient Instructions (Signed)
Will treat with a prednisone taper to get current symptoms under control.  Then go back to your usual 10mg  a day. Discuss with your rheumatologist other medications for your autoimmune disease to see if they may or may not be better.  followup with me in one year if doing well, and as needed for increased symptoms.

## 2012-12-15 NOTE — Addendum Note (Signed)
Addended by: Gweneth Dimitri D on: 12/15/2012 03:06 PM   Modules accepted: Orders

## 2012-12-15 NOTE — Progress Notes (Signed)
   Subjective:    Patient ID: Kimberly [name], female    DOB: 04-05-1945, 67 y.o.   MRN: [id number]  HPI The patient comes in today for followup of her known right pleural effusion, related to pleuritis from her autoimmune process. She has been maintained by her rheumatologist on 10 mg of prednisone a day, and has done well from a pleurisy standpoint until this week. She has noticed increasing right-sided discomfort, especially with breathing. It is nowhere near as severe as before, but is noticeable. She does not have increased shortness of breath, and has no issues at rest. Her dyspnea with moderate to heavy her exertional activities is at baseline.   Review of Systems  Constitutional: Negative for fever and unexpected weight change.  HENT: Positive for postnasal drip. Negative for congestion, dental problem, ear pain, nosebleeds, rhinorrhea, sinus pressure, sneezing, sore throat and trouble swallowing.   Eyes: Negative for redness and itching.  Respiratory: Positive for cough and shortness of breath. Negative for chest tightness and wheezing.   Cardiovascular: Positive for leg swelling. Negative for palpitations.  Gastrointestinal: Negative for nausea and vomiting.  Genitourinary: Negative for dysuria.  Musculoskeletal: Negative for joint swelling.  Skin: Negative for rash.  Neurological: Negative for headaches.  Hematological: Bruises/bleeds easily.  Psychiatric/Behavioral: Negative for dysphoric mood. The patient is not nervous/anxious.        Objective:   Physical Exam Obese female in no acute distress Nose without purulence or discharge noted Neck without lymphadenopathy or thyromegaly Chest with crackles at the right base but no rub. Cardiac exam with regular rate and rhythm Lower extremities with 2+ edema, no cyanosis Alert and oriented, moves all 4 extremities.       Assessment & Plan:

## 2012-12-15 NOTE — Assessment & Plan Note (Signed)
The patient is having increased pleuritic chest pain on the right side this week, despite being on prednisone at 10 mg a day. She is known to have an uncharacterized autoimmune process. Her chest x-ray today shows a little more pleural fluid on the right, as well as increased fluid in the fissure. I would like to give her a prednisone burst, and wean her back to 10 mg a day to see if this will help. I have asked her to speak with her rheumatologist about different types of immunosuppressive agents.

## 2012-12-21 DIAGNOSIS — R300 Dysuria: Secondary | ICD-10-CM | POA: Diagnosis not present

## 2012-12-21 DIAGNOSIS — R5381 Other malaise: Secondary | ICD-10-CM | POA: Diagnosis not present

## 2012-12-21 DIAGNOSIS — L408 Other psoriasis: Secondary | ICD-10-CM | POA: Diagnosis not present

## 2012-12-21 DIAGNOSIS — M5030 Other cervical disc degeneration, unspecified cervical region: Secondary | ICD-10-CM | POA: Diagnosis not present

## 2012-12-21 DIAGNOSIS — Z09 Encounter for follow-up examination after completed treatment for conditions other than malignant neoplasm: Secondary | ICD-10-CM | POA: Diagnosis not present

## 2012-12-21 DIAGNOSIS — Z79899 Other long term (current) drug therapy: Secondary | ICD-10-CM | POA: Diagnosis not present

## 2012-12-21 DIAGNOSIS — I776 Arteritis, unspecified: Secondary | ICD-10-CM | POA: Diagnosis not present

## 2012-12-21 DIAGNOSIS — M2550 Pain in unspecified joint: Secondary | ICD-10-CM | POA: Diagnosis not present

## 2012-12-31 ENCOUNTER — Encounter: Payer: Self-pay | Admitting: Internal Medicine

## 2012-12-31 DIAGNOSIS — E876 Hypokalemia: Secondary | ICD-10-CM | POA: Insufficient documentation

## 2013-01-03 ENCOUNTER — Telehealth: Payer: Self-pay | Admitting: *Deleted

## 2013-01-03 NOTE — Telephone Encounter (Signed)
Called and spoke with patient concerning making a lab appt in 2 weeks to recheck potassium. Patient is going to call back in two weeks to have the order faxed to her office to have it drawn there. JG//CMA

## 2013-01-09 ENCOUNTER — Other Ambulatory Visit: Payer: Self-pay | Admitting: *Deleted

## 2013-01-09 DIAGNOSIS — I10 Essential (primary) hypertension: Secondary | ICD-10-CM

## 2013-01-09 MED ORDER — VALSARTAN 320 MG PO TABS: 320.0000 mg | ORAL_TABLET | Freq: Every day | ORAL | Status: DC

## 2013-01-09 MED ORDER — FUROSEMIDE 40 MG PO TABS: 40.0000 mg | ORAL_TABLET | Freq: Every day | ORAL | Status: DC

## 2013-01-25 DIAGNOSIS — Z79899 Other long term (current) drug therapy: Secondary | ICD-10-CM | POA: Diagnosis not present

## 2013-01-31 DIAGNOSIS — Z111 Encounter for screening for respiratory tuberculosis: Secondary | ICD-10-CM | POA: Diagnosis not present

## 2013-02-14 DIAGNOSIS — Z79899 Other long term (current) drug therapy: Secondary | ICD-10-CM | POA: Diagnosis not present

## 2013-03-14 DIAGNOSIS — Z79899 Other long term (current) drug therapy: Secondary | ICD-10-CM | POA: Diagnosis not present

## 2013-03-20 ENCOUNTER — Ambulatory Visit (INDEPENDENT_AMBULATORY_CARE_PROVIDER_SITE_OTHER)
Admission: RE | Admit: 2013-03-20 | Discharge: 2013-03-20 | Disposition: A | Discharge status: Home / Self Care | Payer: Medicare Other | Source: Ambulatory Visit | Attending: Pulmonary Disease | Admitting: Pulmonary Disease

## 2013-03-20 ENCOUNTER — Encounter: Payer: Self-pay | Admitting: Pulmonary Disease

## 2013-03-20 ENCOUNTER — Other Ambulatory Visit (INDEPENDENT_AMBULATORY_CARE_PROVIDER_SITE_OTHER): Payer: Medicare Other

## 2013-03-20 ENCOUNTER — Ambulatory Visit (INDEPENDENT_AMBULATORY_CARE_PROVIDER_SITE_OTHER): Payer: Medicare Other | Admitting: Pulmonary Disease

## 2013-03-20 VITALS — BP 124/74 | HR 86 | Temp 99.1°F | Ht 63.0 in | Wt 299.8 lb

## 2013-03-20 DIAGNOSIS — J90 Pleural effusion, not elsewhere classified: Secondary | ICD-10-CM

## 2013-03-20 DIAGNOSIS — R0989 Other specified symptoms and signs involving the circulatory and respiratory systems: Secondary | ICD-10-CM

## 2013-03-20 DIAGNOSIS — R05 Cough: Secondary | ICD-10-CM | POA: Diagnosis not present

## 2013-03-20 DIAGNOSIS — R0600 Dyspnea, unspecified: Secondary | ICD-10-CM

## 2013-03-20 DIAGNOSIS — R059 Cough, unspecified: Secondary | ICD-10-CM | POA: Diagnosis not present

## 2013-03-20 DIAGNOSIS — R0602 Shortness of breath: Secondary | ICD-10-CM | POA: Diagnosis not present

## 2013-03-20 DIAGNOSIS — R0609 Other forms of dyspnea: Secondary | ICD-10-CM

## 2013-03-20 LAB — BASIC METABOLIC PANEL
BUN: 11 mg/dL (ref 6–23)
CHLORIDE: 102 meq/L (ref 96–112)
CO2: 28 mEq/L (ref 19–32)
Calcium: 9.1 mg/dL (ref 8.4–10.5)
Creatinine, Ser: 0.7 mg/dL (ref 0.4–1.2)
GFR: 95.09 mL/min (ref >60.00)
Glucose, Bld: 116 mg/dL — ABNORMAL HIGH (ref 70–99)
POTASSIUM: 3.5 meq/L (ref 3.5–5.1)
SODIUM: 141 meq/L (ref 135–145)

## 2013-03-20 LAB — BRAIN NATRIURETIC PEPTIDE: PRO B NATRI PEPTIDE: 69 pg/mL (ref 0.0–100.0)

## 2013-03-20 LAB — CBC WITH DIFFERENTIAL/PLATELET
BASOS ABS: 0 10*3/uL (ref 0.0–0.1)
Basophils Relative: 0.2 % (ref 0.0–3.0)
EOS ABS: 0.1 10*3/uL (ref 0.0–0.7)
Eosinophils Relative: 1 % (ref 0.0–5.0)
HCT: 35.8 % — ABNORMAL LOW (ref 36.0–46.0)
HEMOGLOBIN: 11.5 g/dL — AB (ref 12.0–15.0)
Lymphs Abs: 0.6 10*3/uL — ABNORMAL LOW (ref 0.7–4.0)
MCHC: 32.1 g/dL (ref 30.0–36.0)
MCV: 83.1 fl (ref 78.0–100.0)
MONOS PCT: 6.2 % (ref 3.0–12.0)
Monocytes Absolute: 0.6 10*3/uL (ref 0.1–1.0)
NEUTROS ABS: 8.8 10*3/uL — AB (ref 1.4–7.7)
Platelets: 362 10*3/uL (ref 150.0–400.0)
RBC: 4.31 Mil/uL (ref 3.87–5.11)
RDW: 18.5 % — AB (ref 11.5–14.6)
WBC: 10.1 10*3/uL (ref 4.5–10.5)

## 2013-03-20 NOTE — Patient Instructions (Addendum)
NO recurrence of fluid on CXR Cough syrup ok Call for antibiotic if fever or yellow/green phlegm Blood work today

## 2013-03-20 NOTE — Assessment & Plan Note (Signed)
Resolved

## 2013-03-20 NOTE — Progress Notes (Signed)
   Subjective:    Patient ID: Kimberly [name], female    DOB: [birth year], 68 y.o.   MRN: [id number]  HPI  68 yo never smoker with right pleural effusion, related to pleuritis from  autoimmune process. She has been maintained by her rheumatologist on 10 mg of prednisone a day, methotrexate was added in January 2015.   03/20/2013  28m FU Chief Complaint  Patient presents with  . Acute Visit    [place] pt. C/o prod ncough w/ yellow-brown phlem, wheezing chest tx, increase SOB, nasal congestion x last thursday    C/o cough x 1 week , es p on lying on right Low grade fever few ds ago, 99.1 today On methotrexate since jan 2015 3/ 2015 -LFTs nml, WC 11.1 (last week) She also complained of increased dyspnea, and desaturated from 92-86% on walking around the office. She has bipedal edema which is chronic.   Chest x-ray shows right effusion has resolved,  No new infiltrates   Past Medical History  Diagnosis Date  . Myasthenia gravis without exacerbation     [facility], Dr Anna Genre  . Diplopia   . Family history of ischemic heart disease   . Unspecified vitamin D deficiency     Dr Estanislado Pandy  . Other and unspecified hyperlipidemia   . Vasculitis     Dr Estanislado Pandy  . Myalgia and myositis, unspecified   . GERD (gastroesophageal reflux disease)   . Psoriasis     plantar ;Dr Jarome Matin  . Arthritis   . Heart murmur      Review of Systems neg for any significant sore throat, dysphagia, itching, sneezing, nasal congestion or excess/ purulent secretions, fever, chills, sweats, unintended wt loss, pleuritic or exertional cp, hempoptysis, orthopnea pnd or change in chronic leg swelling. Also denies presyncope, palpitations, heartburn, abdominal pain, nausea, vomiting, diarrhea or change in bowel or urinary habits, dysuria,hematuria, rash, arthralgias, visual complaints, headache, numbness weakness or ataxia.     Objective:   Physical Exam   Gen. Pleasant, obese, in no distress ENT - no  lesions, no post nasal drip Neck: No JVD, no thyromegaly, no carotid bruits Lungs: no use of accessory muscles, no dullness to percussion, decreased without rales or rhonchi  Cardiovascular: Rhythm regular, heart sounds  normal, ESM 2/6 at base, no gallops, 2+ peripheral edema Musculoskeletal: No deformities, no cyanosis or clubbing , no tremors, mild erythema + over BLE        Assessment & Plan:

## 2013-03-20 NOTE — Assessment & Plan Note (Signed)
Unclear cause of her increased dyspnea and bipedal edema and desaturation on walking. We'll obtain a CBC , BMP and d-dimer today If BNP is high , we will proceed with an echo to further investigate the soft ejection systolic murmur If on the other hand d-dimer is high, we'll proceed with a CT angiogram although there is low clinical suspicion for VTE.  It is possible that pedal edema is more related to vasculitis and amlodipine

## 2013-03-21 ENCOUNTER — Other Ambulatory Visit: Payer: Self-pay | Admitting: Pulmonary Disease

## 2013-03-21 DIAGNOSIS — R0600 Dyspnea, unspecified: Secondary | ICD-10-CM

## 2013-03-21 LAB — D-DIMER, QUANTITATIVE (NOT AT ARMC): D DIMER QUANT: 1.14 ug{FEU}/mL — AB (ref 0.00–0.48)

## 2013-03-22 ENCOUNTER — Ambulatory Visit (INDEPENDENT_AMBULATORY_CARE_PROVIDER_SITE_OTHER)
Admission: RE | Admit: 2013-03-22 | Discharge: 2013-03-22 | Disposition: A | Discharge status: Home / Self Care | Payer: Medicare Other | Source: Ambulatory Visit | Attending: Pulmonary Disease | Admitting: Pulmonary Disease

## 2013-03-22 ENCOUNTER — Telehealth: Payer: Self-pay | Admitting: Pulmonary Disease

## 2013-03-22 DIAGNOSIS — R0609 Other forms of dyspnea: Secondary | ICD-10-CM

## 2013-03-22 DIAGNOSIS — R0602 Shortness of breath: Secondary | ICD-10-CM | POA: Diagnosis not present

## 2013-03-22 DIAGNOSIS — R0989 Other specified symptoms and signs involving the circulatory and respiratory systems: Secondary | ICD-10-CM | POA: Diagnosis not present

## 2013-03-22 DIAGNOSIS — R0600 Dyspnea, unspecified: Secondary | ICD-10-CM

## 2013-03-22 MED ORDER — IOHEXOL 350 MG/ML SOLN
80.0000 mL | Freq: Once | INTRAVENOUS | Status: AC | PRN
  Administered 2013-03-22: 80 mL via INTRAVENOUS

## 2013-03-22 NOTE — Telephone Encounter (Signed)
Received call from [facility] with CT.  RA ordered CT Angio to rule out PE. Rose calling to let RA know results are in epic.  Pt has left.   RA is on vacation.  I spoke with Mindy.  Per Mindy, RA had asked Lawrence to address this.  Byron if off this evening and will return to office in am.   As this was not a call report, only a FYI, will forward to [facility] to address in am. Thank you.

## 2013-03-23 NOTE — Telephone Encounter (Signed)
Please advise regarding CT results Dr. Gwenette Greet thanks

## 2013-03-28 ENCOUNTER — Telehealth: Payer: Self-pay | Admitting: Pulmonary Disease

## 2013-03-28 NOTE — Telephone Encounter (Signed)
Pt has ov with me in am and will discuss with her then.

## 2013-03-28 NOTE — Telephone Encounter (Signed)
Notes Recorded by Kathee Delton, MD on 03/27/2013 at 9:37 AM Called pt on mobile, mailbox is full and can't leave a message. Called home phone and [facility] for her to call us and leave number where I can reach her btw pts --- Please advise KC, pt is returning a call. She can be reached at her work # above. thanks

## 2013-03-29 ENCOUNTER — Ambulatory Visit (INDEPENDENT_AMBULATORY_CARE_PROVIDER_SITE_OTHER): Payer: Medicare Other | Admitting: Pulmonary Disease

## 2013-03-29 ENCOUNTER — Encounter: Payer: Self-pay | Admitting: Pulmonary Disease

## 2013-03-29 VITALS — BP 122/78 | HR 78 | Temp 97.9°F | Ht 63.0 in | Wt 295.0 lb

## 2013-03-29 DIAGNOSIS — R9389 Abnormal findings on diagnostic imaging of other specified body structures: Secondary | ICD-10-CM

## 2013-03-29 DIAGNOSIS — J90 Pleural effusion, not elsewhere classified: Secondary | ICD-10-CM | POA: Diagnosis not present

## 2013-03-29 DIAGNOSIS — J189 Pneumonia, unspecified organism: Secondary | ICD-10-CM | POA: Insufficient documentation

## 2013-03-29 MED ORDER — AMOXICILLIN-POT CLAVULANATE 875-125 MG PO TABS: 1.0000 | ORAL_TABLET | Freq: Two times a day (BID) | ORAL | Status: DC

## 2013-03-29 MED ORDER — HYDROCODONE-HOMATROPINE 5-1.5 MG/5ML PO SYRP: 5.0000 mL | ORAL_SOLUTION | Freq: Four times a day (QID) | ORAL | Status: DC | PRN

## 2013-03-29 NOTE — Assessment & Plan Note (Signed)
The patient has been noted on CT chest to have a lingular infiltrate that may be inflammatory or infectious. She does have a cough that is productive of purulent mucus, but also has a cough that's dry and hacking in nature. I would like to treat her for a possible bacterial infectious process first, and see how she responds. If she continues to have issues, we may need to consider bronchoscopy with lavage and biopsy of her lingular infiltrate, especially since she is on immunosuppressive agents.

## 2013-03-29 NOTE — Patient Instructions (Signed)
augmentin 875mg  am and pm on full stomach and with large glass of water for 10 days. hydromet cough syrup one teaspoon every 6 hrs if needed for cough followup with me again in 3 weeks, but let me know if you are not getting better.

## 2013-03-29 NOTE — Progress Notes (Signed)
   Subjective:    Patient ID: Kimberly [name], female    DOB: 08/06/1945, 68 y.o.   MRN: [id number]  HPI The patient comes in today for an acute sick visit. She has a history of a pleural effusion that was felt secondary to some type of autoimmune process. She is being followed by rheumatology, and treated with methotrexate and low-dose prednisone.  She has had total resolution of her pleural effusion, but was recently seen by one of my partners with increasing cough and shortness of breath. A d-dimer was elevated, and she subsequently underwent a CT chest. There was no evidence for a pulmonary embolus, and her pleural effusions had resolved. However, she had a new lingular infiltrate that could either represent inflammation or infection. The patient currently has 2 types of cough, 1 dry and hacking, and is probably coming from her upper airway. The other is more productive of purulent mucus, and she feels is coming from her chest. The patient denies any fevers, chills, or sweats.   Review of Systems  Constitutional: Negative for fever and unexpected weight change.  HENT: Negative for congestion, dental problem, ear pain, nosebleeds, postnasal drip, rhinorrhea, sinus pressure, sneezing, sore throat and trouble swallowing.   Eyes: Negative for redness and itching.  Respiratory: Positive for cough, shortness of breath and wheezing. Negative for chest tightness.   Cardiovascular: Negative for palpitations and leg swelling.  Gastrointestinal: Negative for nausea and vomiting.  Genitourinary: Negative for dysuria.  Musculoskeletal: Negative for joint swelling.  Skin: Negative for rash.  Neurological: Negative for headaches.  Hematological: Does not bruise/bleed easily.  Psychiatric/Behavioral: Negative for dysphoric mood. The patient is not nervous/anxious.        Objective:   Physical Exam Morbidly obese female in no acute distress Nose without purulence or discharge noted Oropharynx  clear Neck without lymphadenopathy or thyromegaly Chest totally clear to auscultation, no wheezing Cardiac exam with regular rate and rhythm Lower extremities with 2+ edema and erythema, no cyanosis Alert and oriented, moves all 4 extremities.       Assessment & Plan:

## 2013-03-29 NOTE — Assessment & Plan Note (Signed)
The patient's pleural effusion has totally resolved by her recent CT chest.

## 2013-03-30 ENCOUNTER — Encounter: Payer: Self-pay | Admitting: Pulmonary Disease

## 2013-03-30 NOTE — Telephone Encounter (Signed)
Please see phone msg from 03/28/13 for further information. Pt was seen by [facility] on 03/29/13. WIll sign off.

## 2013-04-05 DIAGNOSIS — M5030 Other cervical disc degeneration, unspecified cervical region: Secondary | ICD-10-CM | POA: Diagnosis not present

## 2013-04-05 DIAGNOSIS — L408 Other psoriasis: Secondary | ICD-10-CM | POA: Diagnosis not present

## 2013-04-05 DIAGNOSIS — Z09 Encounter for follow-up examination after completed treatment for conditions other than malignant neoplasm: Secondary | ICD-10-CM | POA: Diagnosis not present

## 2013-04-05 DIAGNOSIS — I776 Arteritis, unspecified: Secondary | ICD-10-CM | POA: Diagnosis not present

## 2013-04-19 ENCOUNTER — Ambulatory Visit (INDEPENDENT_AMBULATORY_CARE_PROVIDER_SITE_OTHER): Payer: Medicare Other | Admitting: Pulmonary Disease

## 2013-04-19 ENCOUNTER — Encounter: Payer: Self-pay | Admitting: Pulmonary Disease

## 2013-04-19 VITALS — BP 118/78 | HR 76 | Temp 99.1°F | Ht 63.0 in | Wt 296.4 lb

## 2013-04-19 DIAGNOSIS — J189 Pneumonia, unspecified organism: Secondary | ICD-10-CM | POA: Diagnosis not present

## 2013-04-19 DIAGNOSIS — Z23 Encounter for immunization: Secondary | ICD-10-CM

## 2013-04-19 NOTE — Patient Instructions (Signed)
Will give you the pneumonia shot today.  Keep your scheduled followup with me, but call if you feel you are not doing well.

## 2013-04-19 NOTE — Progress Notes (Signed)
   Subjective:    Patient ID: Kimberly [name], female    DOB: 04-May-1945, 68 y.o.   MRN: [id number]  HPI The patient comes in today for followup of her left lung infiltrate noted on CT chest. This was treated with antibiotics for a possible bacterial process, and the patient is seen significant improvement in her symptoms. Her cough has nearly resolved, and her breathing has improved as well. She is no longer feels congested. She denies any fevers, chills, or sweats.   Review of Systems  Constitutional: Negative for fever and unexpected weight change.  HENT: Negative for congestion, dental problem, ear pain, nosebleeds, postnasal drip, rhinorrhea, sinus pressure, sneezing, sore throat and trouble swallowing.   Eyes: Negative for redness and itching.  Respiratory: Positive for cough and shortness of breath. Negative for chest tightness and wheezing.   Cardiovascular: Negative for palpitations and leg swelling.  Gastrointestinal: Negative for nausea and vomiting.  Genitourinary: Negative for dysuria.  Musculoskeletal: Negative for joint swelling.  Skin: Negative for rash.  Neurological: Negative for headaches.  Hematological: Does not bruise/bleed easily.  Psychiatric/Behavioral: Negative for dysphoric mood. The patient is not nervous/anxious.        Objective:   Physical Exam Obese female in no acute distress Nose without purulence or discharge noted Neck without lymphadenopathy or thyromegaly Chest totally clear to auscultation with no crackles or wheezes Cardiac exam with regular rate and rhythm Lower extremities with 3+ edema, no cyanosis Alert and oriented, moves all 4 extremities.       Assessment & Plan:

## 2013-04-19 NOTE — Assessment & Plan Note (Signed)
The patient has had a significant clinical response with antibiotic treatment, and feels that she is slowly moving toward her usual baseline. I suspect her previous lingular infiltrate on CT chest was simply bacterial pneumonia given her good clinical response to traditional antibiotics. Her cough has nearly resolved, and her breathing is nearing her baseline. She is to call me if she feels that she has worsening symptoms or a recurrence.

## 2013-04-19 NOTE — Addendum Note (Signed)
Addended by: Maurice March on: 04/19/2013 11:59 AM   Modules accepted: Orders

## 2013-05-15 DIAGNOSIS — Z79899 Other long term (current) drug therapy: Secondary | ICD-10-CM | POA: Diagnosis not present

## 2013-07-09 ENCOUNTER — Other Ambulatory Visit: Payer: Self-pay

## 2013-07-09 DIAGNOSIS — I10 Essential (primary) hypertension: Secondary | ICD-10-CM

## 2013-07-09 MED ORDER — FUROSEMIDE 40 MG PO TABS: 40.0000 mg | ORAL_TABLET | Freq: Every day | ORAL | Status: DC

## 2013-07-09 MED ORDER — VALSARTAN 320 MG PO TABS: 320.0000 mg | ORAL_TABLET | Freq: Every day | ORAL | Status: DC

## 2013-08-22 DIAGNOSIS — Z79899 Other long term (current) drug therapy: Secondary | ICD-10-CM | POA: Diagnosis not present

## 2013-10-08 DIAGNOSIS — M5030 Other cervical disc degeneration, unspecified cervical region: Secondary | ICD-10-CM | POA: Diagnosis not present

## 2013-10-08 DIAGNOSIS — M25569 Pain in unspecified knee: Secondary | ICD-10-CM | POA: Diagnosis not present

## 2013-10-08 DIAGNOSIS — I776 Arteritis, unspecified: Secondary | ICD-10-CM | POA: Diagnosis not present

## 2013-10-08 DIAGNOSIS — L408 Other psoriasis: Secondary | ICD-10-CM | POA: Diagnosis not present

## 2013-10-14 ENCOUNTER — Encounter: Payer: Self-pay | Admitting: Internal Medicine

## 2013-11-21 DIAGNOSIS — Z23 Encounter for immunization: Secondary | ICD-10-CM | POA: Diagnosis not present

## 2013-11-21 DIAGNOSIS — M1711 Unilateral primary osteoarthritis, right knee: Secondary | ICD-10-CM | POA: Diagnosis not present

## 2014-01-16 DIAGNOSIS — Z79899 Other long term (current) drug therapy: Secondary | ICD-10-CM | POA: Diagnosis not present

## 2014-01-29 ENCOUNTER — Telehealth: Payer: Self-pay | Admitting: Internal Medicine

## 2014-01-29 NOTE — Telephone Encounter (Signed)
Pt would like a referral put in to start seeing Dr Posey Pronto at [facility]. It is to far of a drive to [employer]

## 2014-01-30 NOTE — Telephone Encounter (Signed)
Patient has been advised

## 2014-01-30 NOTE — Telephone Encounter (Signed)
I last saw her 10/2012 Her options are: #1Ask [facility] to refer her & provide updated records #2 Call Dr Posey Pronto & request appt (I doubt they'll see her w/o update in chart) #3 schedule CPX with me to update chart & make referral. I can't make referral otherwise as I have not seen her for > 14 months. Sorry but her  insurance would require current assessment by me if chart to cover referral

## 2014-02-07 DIAGNOSIS — I776 Arteritis, unspecified: Secondary | ICD-10-CM | POA: Diagnosis not present

## 2014-02-07 DIAGNOSIS — Z79899 Other long term (current) drug therapy: Secondary | ICD-10-CM | POA: Diagnosis not present

## 2014-02-07 DIAGNOSIS — L408 Other psoriasis: Secondary | ICD-10-CM | POA: Diagnosis not present

## 2014-02-07 DIAGNOSIS — M170 Bilateral primary osteoarthritis of knee: Secondary | ICD-10-CM | POA: Diagnosis not present

## 2014-02-11 ENCOUNTER — Ambulatory Visit (INDEPENDENT_AMBULATORY_CARE_PROVIDER_SITE_OTHER)
Admission: RE | Admit: 2014-02-11 | Discharge: 2014-02-11 | Disposition: A | Discharge status: Home / Self Care | Payer: Medicare Other | Source: Ambulatory Visit | Attending: Pulmonary Disease | Admitting: Pulmonary Disease

## 2014-02-11 ENCOUNTER — Ambulatory Visit (INDEPENDENT_AMBULATORY_CARE_PROVIDER_SITE_OTHER): Payer: Medicare Other | Admitting: Pulmonary Disease

## 2014-02-11 ENCOUNTER — Encounter: Payer: Self-pay | Admitting: Pulmonary Disease

## 2014-02-11 VITALS — BP 134/78 | HR 76 | Temp 97.1°F | Ht 62.5 in | Wt 313.4 lb

## 2014-02-11 DIAGNOSIS — I517 Cardiomegaly: Secondary | ICD-10-CM | POA: Diagnosis not present

## 2014-02-11 DIAGNOSIS — R0989 Other specified symptoms and signs involving the circulatory and respiratory systems: Secondary | ICD-10-CM | POA: Diagnosis not present

## 2014-02-11 DIAGNOSIS — J90 Pleural effusion, not elsewhere classified: Secondary | ICD-10-CM | POA: Diagnosis not present

## 2014-02-11 DIAGNOSIS — I10 Essential (primary) hypertension: Secondary | ICD-10-CM | POA: Diagnosis not present

## 2014-02-11 NOTE — Patient Instructions (Signed)
Will check chest xray today, and call you with results. I am ok with decreasing your prednisone dose, and can just follow to see if fluid on lung comes back.  followup with me again in one year

## 2014-02-11 NOTE — Assessment & Plan Note (Signed)
The patient has a history of a recurring right pleural effusion that is felt to be autoimmune in origin. He is being followed closely by rheumatology, and maintained on an immunosuppressive regimen. The patient feels that she has done very well since the last visit, and is asking whether her prednisone can be significantly decreased. I would certainly be okay with this, and we can simply monitor her going forward to see if her effusion recurs. We'll check a chest x-ray today to establish baseline, and will see her back in one year if she is doing well. She knows to call us if she is having issues.

## 2014-02-11 NOTE — Progress Notes (Signed)
   Subjective:    Patient ID: Kimberly [name], female    DOB: [birth year], 69 y.o.   MRN: [id number]  HPI Patient comes in today for follow-up of her history of recurring right pleural effusion. At the last visit, this had resolved, and is felt secondary to some type of autoimmune disease. She is being followed by rheumatology, and is being treated with methotrexate and prednisone. She feels that she has done well from this standpoint, and is asking if her prednisone can be weaned. She does not have any significant cough, but does have her usual chronic dyspnea on exertion secondary to her morbid obesity. She has also had worsening lower extremity edema with weight gain, and I wonder if this is secondary to diastolic dysfunction.   Review of Systems  Constitutional: Negative for fever and unexpected weight change.  HENT: Positive for congestion and postnasal drip. Negative for dental problem, ear pain, nosebleeds, rhinorrhea, sinus pressure, sneezing, sore throat and trouble swallowing.   Eyes: Negative for redness and itching.  Respiratory: Positive for cough and shortness of breath. Negative for chest tightness and wheezing.   Cardiovascular: Positive for leg swelling. Negative for palpitations.  Gastrointestinal: Negative for nausea and vomiting.  Genitourinary: Negative for dysuria.  Musculoskeletal: Negative for joint swelling.  Skin: Negative for rash.  Neurological: Negative for headaches.  Hematological: Does not bruise/bleed easily.  Psychiatric/Behavioral: Negative for dysphoric mood. The patient is not nervous/anxious.        Objective:   Physical Exam Morbidly obese female in no acute distress Nose without purulence or discharge noted Neck without lymphadenopathy or thyromegaly Chest with clear breath sounds, and minimal basilar crackles. Cardiac exam with regular rate and rhythm, 2/6 systolic murmur Lower extremities with 2+ edema, and evidence for venous  insufficiency Alert and oriented, moves all 4 extremities.       Assessment & Plan:

## 2014-02-15 DIAGNOSIS — M170 Bilateral primary osteoarthritis of knee: Secondary | ICD-10-CM | POA: Diagnosis not present

## 2014-02-22 DIAGNOSIS — M170 Bilateral primary osteoarthritis of knee: Secondary | ICD-10-CM | POA: Diagnosis not present

## 2014-03-01 DIAGNOSIS — M170 Bilateral primary osteoarthritis of knee: Secondary | ICD-10-CM | POA: Diagnosis not present

## 2014-03-08 DIAGNOSIS — M170 Bilateral primary osteoarthritis of knee: Secondary | ICD-10-CM | POA: Diagnosis not present

## 2014-03-18 ENCOUNTER — Ambulatory Visit (INDEPENDENT_AMBULATORY_CARE_PROVIDER_SITE_OTHER): Payer: Medicare Other | Admitting: Neurology

## 2014-03-18 ENCOUNTER — Encounter: Payer: Self-pay | Admitting: Neurology

## 2014-03-18 VITALS — BP 132/80 | HR 82 | Ht 63.0 in | Wt 300.6 lb

## 2014-03-18 DIAGNOSIS — R2681 Unsteadiness on feet: Secondary | ICD-10-CM

## 2014-03-18 DIAGNOSIS — G7000 Myasthenia gravis without (acute) exacerbation: Secondary | ICD-10-CM | POA: Diagnosis not present

## 2014-03-18 DIAGNOSIS — R270 Ataxia, unspecified: Secondary | ICD-10-CM | POA: Diagnosis not present

## 2014-03-18 MED ORDER — PYRIDOSTIGMINE BROMIDE 60 MG PO TABS: 60.0000 mg | ORAL_TABLET | Freq: Three times a day (TID) | ORAL | Status: DC

## 2014-03-18 NOTE — Patient Instructions (Signed)
1.  Check blood work 2.  Continue your medications as you are taking them.  Refills for mestinon has been sent to your pharmacy 3.  I'll see you back in August, or sooner if you need me

## 2014-03-18 NOTE — Progress Notes (Signed)
[facility] Clinic Note - Initial Visit   Date: 03/18/2014   Kimberly [name] MRN: [id number] DOB: 1945-12-29   Dear Dr. Estanislado Pandy:  Thank you for your kind referral of Kimberly [name] for consultation of myasthenia gravis. Although her history is well known to you, please allow [place] to reiterate it for the purpose of our medical record. The patient was accompanied to the clinic by husband who also provides collateral information.     History of Present Illness: Kimberly [name] is a 69 y.o. right-handed Caucasian female with osteoarthritis, leg edema, leukoclastic vasculitis (2010) being followed by Dr. Estanislado Pandy, thoracic T1-2 laminectomy (2011), and cervical fusion at C5-6 (2008) by Dr. Vertell Limber who presenting for evaluation of seronegative myasthenia gravis.  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  In 2011, patient started developing diplopia, blurred vision, ptosis, and gait abnormality for which she initially saw Dr. 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 She underwent MRI/A/V of the brain which showed T2 abnormality in the right cerebellar lesion so was referred her to Dr. Anna Genre at [facility]  who took over her care.  Per review of Dr. Alberteen Sam notes, the lesion was thought to be nonspecific and likely incidental as it would not explain all her symptoms.  She had two unsuccessful attempts with lumbar puncture and because of low suspicion for MS, it was not pursued again.  In the meantime, she also underwent repetitive nerve stimulation with single fiber EMG which showed evidence of neuromuscular junction disorder.   AChR antibodies were negative.  At the time of her myasthenia gravis diagnosis, patient was already taking prednisone (possibly 30mg ?) for vasculitis, so was started on mestinon 60mg  three times daily which helped her muscle strength and resolved her diplopia.  She remained on prednisone but during  the winters of 2013 and 2014, she was was struggling with pneumonia with pleural effusion requiring thoracentesis.  Because of this, she was started on methotrexate in January 2015 and reports not having pneumonia this past winter.  She has never been hospitalized for myasthenia, required intubation, or had any crisis.   Currently, she has been doing well from an autoimmune stand-point and Dr. Patrecia Pour has been slowly tapering her prednisone by 1mg  every 2 month and is currently on 9mg  daily.  She is tolerating methotrexate 20mg  weekly.  She continues to have right ptosis intermittently especially when tired, but no other weakness, dysphagia, dysarthria, or diplopia.    No new complaints today.   Out-side paper records, electronic medical record, and images have been reviewed where available and summarized as:  Labs 01/30/2010:  AChR binding, blocking, and modulating antibody - negative, ANA negative, MPO antibody negative Labs 11/29/2009:  AChR binding, blocking, and modulating antibody - negative, ANA negative, dsDNA neg, RPR neg  MRI brain wo contrast 11/28/2009: 1.  Large area of T1 signal abnormality of the right cerebellar hemisphere, involving the white matter.  This unusual appearance is most suggestive of a demyelinating process such as multiple sclerosis, ADEM, or PML.  Given the patient's immunosuppression, PML should be excluded.  Vasculitis is of high likelihood given the patient known history.   2.  Chronic parenchymal brain volume loss  MRA/V wo contrast 11/28/2009: 1.  Normal MRV of the brain 2.  No definite abnormality is noted on MRA of the brain.  There is unusual right posterior circulation branching pattern with an apparent fetal origin right PCA and an accessory posterior cerebral  artery with an attached patent posterior communicating artery.  No significant stenosis.   MRI brain wwo contrast 11/29/2009: 1.  Stable appearance of asymmetric increased T2 signal within the right  cerebellum with n ounderlying mass lesion or abnormal enhancement identified.  This finding is indeterminate, but may preresent underlying demyelinating disease.   RNS with SFEMG performed at [facility] 01/09/2010:  Abnormal study.  These electrodiagnostic findings are consistent with a disorder of neuromuscular transmission as can be seen in myasthenia gravis.    Past Medical History  Diagnosis Date  . Myasthenia gravis without exacerbation     [facility], Dr Anna Genre  . Diplopia   . Family history of ischemic heart disease   . Unspecified vitamin D deficiency     Dr Estanislado Pandy  . Other and unspecified hyperlipidemia   . Vasculitis     Dr Estanislado Pandy  . Myalgia and myositis, unspecified   . GERD (gastroesophageal reflux disease)   . Psoriasis     plantar ;Dr Jarome Matin  . Arthritis   . Heart murmur     Past Surgical History  Procedure Laterality Date  . Appendectomy      & exploratory for inflammation; Dr Hassell Done  . Total abdominal hysterectomy w/ bilateral salpingoophorectomy      For Fibroids & endometriosis  . Cholecystectomy      for stones  . Shoulder surgery      Left & Right  . Thoracentesis      for peripneumonic effusion  . Laminectomy  2010    T1-2; Dr Vertell Limber  . Cervical fusion      C5-6; Dr Vertell Limber     Medications:  Current Outpatient Prescriptions on File Prior to Visit  Medication Sig Dispense Refill  . augmented betamethasone dipropionate (DIPROLENE-AF) 0.05 % ointment Apply 1 application topically daily.     . Calcium Carbonate-Vit D-Min (CALCIUM 1200 PO) Take by mouth daily.      Marland Kitchen FOLIC ACID PO Take 2 tablets by mouth daily.    . furosemide (LASIX) 40 MG tablet Take 1 tablet (40 mg total) by mouth daily. 90 tablet 3  . Methotrexate Sodium (METHOTREXATE PO) Take by mouth. 8 tabs every friday    . Multiple Vitamins-Minerals (CENTRUM SILVER PO) Take by mouth daily.      Marland Kitchen omeprazole (PRILOSEC OTC) 20 MG tablet Take 20 mg by mouth as needed.    . predniSONE (DELTASONE)  10 MG tablet Take 10 mg by mouth daily.      Marland Kitchen pyridostigmine (MESTINON) 60 MG tablet Take 60 mg by mouth 3 (three) times daily.      . Pyridoxine HCl (VITAMIN B-6 CR) 200 MG TBCR Take by mouth daily.      . valsartan (DIOVAN) 320 MG tablet Take 1 tablet (320 mg total) by mouth daily. 90 tablet 3  . Zinc 50 MG CAPS Take by mouth daily.       No current facility-administered medications on file prior to visit.    Allergies:  Allergies  Allergen Reactions  . Azithromycin     Angioedema  . Levaquin [Levofloxacin In D5w] Other (See Comments)    D/T MYASTHENIA GRAVIS  . Tetracycline     Rash  . Sulfonamide Derivatives     Seizure age 47 (Note: probably fever induced)  . Amlodipine Besy-Benazepril Hcl     REACTION: cough    Family History: Family History  Problem Relation Age of Onset  . Hypertension Sister   . Hyperthyroidism Sister  Graves  . Heart attack Mother 71  . Heart failure Mother   . Stroke Paternal Grandmother     in 60s  . Lung cancer Father   . Cancer Father     lung  . Diabetes Neg Hx   . Hyperthyroidism Sister     Graves    Social History: History   Social History  . Marital Status: Married    Spouse Name: N/A  . Number of Children: N/A  . Years of Education: N/A   Occupational History  . 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 Smoking status: Never Smoker   . Smokeless tobacco: Never Used  . Alcohol Use: No  . Drug Use: No  . Sexual Activity: Not on file   Other Topics Concern  . Not on file   Social History Narrative   Lives with husband in a one story home.  Had one child that only lived for 14 hours.  Works as a Holiday representative.     Review of Systems:  CONSTITUTIONAL: No fevers, chills, night sweats, or weight loss.   EYES: No visual changes or eye pain ENT: No hearing changes.  No history of nose bleeds.   RESPIRATORY: No cough, wheezing and shortness of breath.   CARDIOVASCULAR: Negative for  chest pain, and palpitations.   GI: Negative for abdominal discomfort, blood in stools or black stools.  No recent change in bowel habits.   GU:  No history of incontinence.   MUSCLOSKELETAL: +history of joint pain or swelling.  No myalgias.   SKIN: +for lesions, rash, and itching.   HEMATOLOGY/ONCOLOGY: Negative for prolonged bleeding, bruising easily, and swollen nodes.  No history of cancer.   ENDOCRINE: Negative for cold or heat intolerance, polydipsia or goiter.   PSYCH:  No depression or anxiety symptoms.   NEURO: As Above.   Vital Signs:  BP 132/80 mmHg  Pulse 82  Ht 5\' 3"  (1.6 m)  Wt 300 lb 9 oz (136.334 kg)  BMI 53.26 kg/m2  SpO2 96%   General Medical Exam:   General:  Well appearing, obese, comfortable.   Eyes/ENT: see cranial nerve examination.   Neck: No masses appreciated.  Full range of motion without tenderness.  No carotid bruits. Respiratory:  Clear to auscultation, good air entry bilaterally.   Cardiac:  Regular rate and rhythm, no murmur.   Extremities:  1+ pitting edema of the legs, limited shoulder range of motion due to arthritis/frozen shoulder  Neurological Exam: MENTAL STATUS including orientation to time, place, person, recent and remote memory, attention span and concentration, language, and fund of knowledge is normal.  Speech is not dysarthric.  CRANIAL NERVES: II:  No visual field defects.  Unremarkable fundi.   [name]-IV-VI: Pupils equal round and reactive to light.  Normal conjugate, extra-ocular eye movements in all directions of gaze.  No nystagmus.  Right ptosis with mild worsening with sustained upward gaze.   V:  Normal facial sensation.  Jaw jerk is absent.   VII:  Normal facial symmetry and movements.  Frontalis, obicularis oculi, orbicularis oris and buccinator muscles are 5/5. No pathologic facial reflexes.  VIII:  Normal hearing and vestibular function.   IX-X:  Normal palatal movement.   XI:  Normal shoulder shrug and head rotation.   XII:   Normal tongue strength and range of motion, no deviation or fasciculation.  MOTOR:  Bilateral shoulder range of motion in abduction/adduction/extension/flexion is reduced, but proximal  strength is intact.  No atrophy, fasciculations or abnormal movements.  No pronator drift.  Tone is normal.    Right Upper Extremity:    Left Upper Extremity:    Deltoid  5/5   Deltoid  5/5   Biceps  5/5   Biceps  5/5   Triceps  5/5   Triceps  5/5   Wrist extensors  5/5   Wrist extensors  5/5   Wrist flexors  5/5   Wrist flexors  5/5   Finger extensors  5/5   Finger extensors  5/5   Finger flexors  5/5   Finger flexors  5/5   Dorsal interossei  5/5   Dorsal interossei  5/5   Abductor pollicis  5/5   Abductor pollicis  5/5   Tone (Ashworth scale)  0  Tone (Ashworth scale)  0   Right Lower Extremity:    Left Lower Extremity:    Hip flexors  5/5   Hip flexors  5/5   Hip extensors  5/5   Hip extensors  5/5   Knee flexors  5/5   Knee flexors  5/5   Knee extensors  5/5   Knee extensors  5/5   Dorsiflexors  5/5   Dorsiflexors  5/5   Plantarflexors  5/5   Plantarflexors  5/5   Toe extensors  5/5   Toe extensors  5/5   Toe flexors  5/5   Toe flexors  5/5   Tone (Ashworth scale)  0  Tone (Ashworth scale)  0   MSRs:  Right                                                                 Left brachioradialis 2+  brachioradialis 2+  biceps 2+  biceps 2+  triceps 2+  triceps 2+  patellar 2+  patellar 2+  ankle jerk 1+  ankle jerk 1+  Hoffman no  Hoffman no  plantar response down  plantar response down   SENSORY:  Normal and symmetric perception of light touch, pinprick, vibration, and proprioception.  Romberg's sign absent.   COORDINATION/GAIT: Normal finger-to- nose-finger and heel-to-shin.  Intact rapid alternating movements bilaterally.  Able to rise from a chair without using arms.  Gait slightly wide-based due to body habitus and mildly unsteady.     IMPRESSION: Kimberly [name] is a 69 year-old female  with leukocytoclastic vasculitis on immunomodulatory therapy presenting to establish care for seronegative myasthenia gravis (diagnosed in 2011 by RNS and [facility]G at [facility]).  Clinically, patient appears stable with only mild right ptosis.  There is no fatigability on exam.  With her vasculitis, patient has been on long-term prednisone and recently started on methotrexate as steroid-sparing agent which is concomitantly also being used to manage her myesthenia gravis.  I agree with slow prednisone taper since she is doing very with from a MG perspective.   Regarding her gait unsteadiness, she may have underlying distal peripheral neuropathy given the subdued distal reflexes.  For completeness, I will screen for neuropathy labs.    PLAN/RECOMMENDATIONS:  1.  Check vitamin B12, vitamin B6, TSH, copper, zinc, and vitamin E 2.  Continue mestinon 60mg  three times daily 3.  Continue slow prednisone taper and methotrexate as per Dr. Estanislado Pandy 4.  Fall precautions  discussed, encouraged to use a cane 5.  Weight loss also encouraged 6.  I will plan to see patient in between her visits with Dr. Patrecia Pour to monitor symptoms closely. Return to clinic in August, or sooner as needed    The duration of this appointment visit was 50 minutes of face-to-face time with the patient.  Greater than 50% of this time was spent in counseling, explanation of diagnosis, planning of further management, and coordination of care.   Thank you for allowing me to participate in patient's care.  If I can answer any additional questions, I would be pleased to do so.    Sincerely,    Donika K. Posey Pronto, DO

## 2014-03-18 NOTE — Progress Notes (Signed)
Note faxed.

## 2014-04-09 DIAGNOSIS — M2550 Pain in unspecified joint: Secondary | ICD-10-CM | POA: Diagnosis not present

## 2014-04-09 DIAGNOSIS — Z79899 Other long term (current) drug therapy: Secondary | ICD-10-CM | POA: Diagnosis not present

## 2014-04-10 ENCOUNTER — Telehealth: Payer: Self-pay | Admitting: Neurology

## 2014-04-10 NOTE — Telephone Encounter (Signed)
Labs received dated 04/09/2014: CMP within normal limits, vitamin B 1433, TSH 3.2, vitamin B 6 and vitamin E pending.  Synthia Fairbank K. Posey Pronto, DO

## 2014-06-13 DIAGNOSIS — M25511 Pain in right shoulder: Secondary | ICD-10-CM | POA: Diagnosis not present

## 2014-06-13 DIAGNOSIS — M79642 Pain in left hand: Secondary | ICD-10-CM | POA: Diagnosis not present

## 2014-06-13 DIAGNOSIS — I776 Arteritis, unspecified: Secondary | ICD-10-CM | POA: Diagnosis not present

## 2014-06-13 DIAGNOSIS — M79641 Pain in right hand: Secondary | ICD-10-CM | POA: Diagnosis not present

## 2014-06-13 DIAGNOSIS — M2550 Pain in unspecified joint: Secondary | ICD-10-CM | POA: Diagnosis not present

## 2014-06-13 DIAGNOSIS — Z79899 Other long term (current) drug therapy: Secondary | ICD-10-CM | POA: Diagnosis not present

## 2014-06-14 DIAGNOSIS — I8311 Varicose veins of right lower extremity with inflammation: Secondary | ICD-10-CM | POA: Diagnosis not present

## 2014-06-14 DIAGNOSIS — I872 Venous insufficiency (chronic) (peripheral): Secondary | ICD-10-CM | POA: Diagnosis not present

## 2014-06-14 DIAGNOSIS — L02419 Cutaneous abscess of limb, unspecified: Secondary | ICD-10-CM | POA: Diagnosis not present

## 2014-06-14 DIAGNOSIS — I8312 Varicose veins of left lower extremity with inflammation: Secondary | ICD-10-CM | POA: Diagnosis not present

## 2014-06-18 DIAGNOSIS — I8311 Varicose veins of right lower extremity with inflammation: Secondary | ICD-10-CM | POA: Diagnosis not present

## 2014-06-18 DIAGNOSIS — I872 Venous insufficiency (chronic) (peripheral): Secondary | ICD-10-CM | POA: Diagnosis not present

## 2014-06-18 DIAGNOSIS — I8312 Varicose veins of left lower extremity with inflammation: Secondary | ICD-10-CM | POA: Diagnosis not present

## 2014-06-21 DIAGNOSIS — I8311 Varicose veins of right lower extremity with inflammation: Secondary | ICD-10-CM | POA: Diagnosis not present

## 2014-06-21 DIAGNOSIS — I8312 Varicose veins of left lower extremity with inflammation: Secondary | ICD-10-CM | POA: Diagnosis not present

## 2014-06-21 DIAGNOSIS — I872 Venous insufficiency (chronic) (peripheral): Secondary | ICD-10-CM | POA: Diagnosis not present

## 2014-06-28 DIAGNOSIS — I83002 Varicose veins of unspecified lower extremity with ulcer of calf: Secondary | ICD-10-CM | POA: Diagnosis not present

## 2014-06-28 DIAGNOSIS — I83003 Varicose veins of unspecified lower extremity with ulcer of ankle: Secondary | ICD-10-CM | POA: Diagnosis not present

## 2014-07-02 DIAGNOSIS — I83002 Varicose veins of unspecified lower extremity with ulcer of calf: Secondary | ICD-10-CM | POA: Diagnosis not present

## 2014-07-05 DIAGNOSIS — I8312 Varicose veins of left lower extremity with inflammation: Secondary | ICD-10-CM | POA: Diagnosis not present

## 2014-07-05 DIAGNOSIS — I8311 Varicose veins of right lower extremity with inflammation: Secondary | ICD-10-CM | POA: Diagnosis not present

## 2014-07-05 DIAGNOSIS — I872 Venous insufficiency (chronic) (peripheral): Secondary | ICD-10-CM | POA: Diagnosis not present

## 2014-07-09 ENCOUNTER — Other Ambulatory Visit: Payer: Self-pay | Admitting: Emergency Medicine

## 2014-07-09 DIAGNOSIS — I10 Essential (primary) hypertension: Secondary | ICD-10-CM

## 2014-07-09 DIAGNOSIS — I8311 Varicose veins of right lower extremity with inflammation: Secondary | ICD-10-CM | POA: Diagnosis not present

## 2014-07-09 DIAGNOSIS — I8312 Varicose veins of left lower extremity with inflammation: Secondary | ICD-10-CM | POA: Diagnosis not present

## 2014-07-09 DIAGNOSIS — I872 Venous insufficiency (chronic) (peripheral): Secondary | ICD-10-CM | POA: Diagnosis not present

## 2014-07-09 MED ORDER — FUROSEMIDE 40 MG PO TABS: 40.0000 mg | ORAL_TABLET | Freq: Every day | ORAL | Status: DC

## 2014-07-09 MED ORDER — VALSARTAN 320 MG PO TABS: 320.0000 mg | ORAL_TABLET | Freq: Every day | ORAL | Status: DC

## 2014-07-12 DIAGNOSIS — I8312 Varicose veins of left lower extremity with inflammation: Secondary | ICD-10-CM | POA: Diagnosis not present

## 2014-07-16 DIAGNOSIS — I8312 Varicose veins of left lower extremity with inflammation: Secondary | ICD-10-CM | POA: Diagnosis not present

## 2014-07-19 DIAGNOSIS — I872 Venous insufficiency (chronic) (peripheral): Secondary | ICD-10-CM | POA: Diagnosis not present

## 2014-07-19 DIAGNOSIS — I8311 Varicose veins of right lower extremity with inflammation: Secondary | ICD-10-CM | POA: Diagnosis not present

## 2014-07-19 DIAGNOSIS — I8312 Varicose veins of left lower extremity with inflammation: Secondary | ICD-10-CM | POA: Diagnosis not present

## 2014-07-30 DIAGNOSIS — I8312 Varicose veins of left lower extremity with inflammation: Secondary | ICD-10-CM | POA: Diagnosis not present

## 2014-07-30 DIAGNOSIS — I8311 Varicose veins of right lower extremity with inflammation: Secondary | ICD-10-CM | POA: Diagnosis not present

## 2014-07-30 DIAGNOSIS — I872 Venous insufficiency (chronic) (peripheral): Secondary | ICD-10-CM | POA: Diagnosis not present

## 2014-08-02 DIAGNOSIS — I8311 Varicose veins of right lower extremity with inflammation: Secondary | ICD-10-CM | POA: Diagnosis not present

## 2014-08-08 ENCOUNTER — Other Ambulatory Visit: Payer: Self-pay | Admitting: Emergency Medicine

## 2014-08-08 DIAGNOSIS — I10 Essential (primary) hypertension: Secondary | ICD-10-CM

## 2014-08-08 MED ORDER — FUROSEMIDE 40 MG PO TABS: 40.0000 mg | ORAL_TABLET | Freq: Every day | ORAL | Status: DC

## 2014-08-08 MED ORDER — VALSARTAN 320 MG PO TABS: 320.0000 mg | ORAL_TABLET | Freq: Every day | ORAL | Status: DC

## 2014-08-09 ENCOUNTER — Ambulatory Visit (INDEPENDENT_AMBULATORY_CARE_PROVIDER_SITE_OTHER): Payer: Medicare Other | Admitting: Internal Medicine

## 2014-08-09 ENCOUNTER — Other Ambulatory Visit: Payer: Self-pay | Admitting: Internal Medicine

## 2014-08-09 ENCOUNTER — Encounter: Payer: Self-pay | Admitting: Internal Medicine

## 2014-08-09 ENCOUNTER — Other Ambulatory Visit (INDEPENDENT_AMBULATORY_CARE_PROVIDER_SITE_OTHER): Payer: Medicare Other

## 2014-08-09 VITALS — BP 146/90 | HR 74 | Temp 97.9°F | Resp 18 | Wt 297.0 lb

## 2014-08-09 DIAGNOSIS — I10 Essential (primary) hypertension: Secondary | ICD-10-CM | POA: Diagnosis not present

## 2014-08-09 DIAGNOSIS — R739 Hyperglycemia, unspecified: Secondary | ICD-10-CM

## 2014-08-09 DIAGNOSIS — E559 Vitamin D deficiency, unspecified: Secondary | ICD-10-CM

## 2014-08-09 DIAGNOSIS — R609 Edema, unspecified: Secondary | ICD-10-CM

## 2014-08-09 DIAGNOSIS — E785 Hyperlipidemia, unspecified: Secondary | ICD-10-CM

## 2014-08-09 DIAGNOSIS — I8312 Varicose veins of left lower extremity with inflammation: Secondary | ICD-10-CM | POA: Diagnosis not present

## 2014-08-09 LAB — HEMOGLOBIN A1C: Hgb A1c MFr Bld: 5.3 % (ref 4.6–6.5)

## 2014-08-09 LAB — TSH: TSH: 2.2 u[IU]/mL (ref 0.35–4.50)

## 2014-08-09 MED ORDER — METOPROLOL TARTRATE 25 MG PO TABS: 25.0000 mg | ORAL_TABLET | Freq: Two times a day (BID) | ORAL | Status: DC

## 2014-08-09 NOTE — Progress Notes (Signed)
   Subjective:    Patient ID: Kimberly [name], female    DOB: 05-30-1945, 69 y.o.   MRN: [id number]  HPI The patient is here to assess status of active health conditions.  PMH, FH, & Social History reviewed & updated.No change in Rand as recorded.  She has been compliant with her medicines without adverse effects. She checks her blood pressure 2 times a week at work. It ranges 148/84-160/98. She is not able to exercise due to her severe musculoskeletal issues.  Her renal function is checked every 2 months due to the rheumatologic regimen. On 06/13/14 BMET revealed BUN 18, creatinine 0.78, and GFR greater than 89. Glucose was 102. There is no family history of diabetes.   There is no active TSH on record. Both her sister and mother had Graves' disease.  When she takes methotrexate she has a severe flulike syndrome with increased musculoskeletal symptoms.  She is also seeing her Dermatologist for "plantar psoriasis".  She has exertional dyspnea. She describes the edema as somewhat variable.    Review of Systems   Chest pain, palpitations, tachycardia,  paroxysmal nocturnal dyspnea, or claudication are absent.  Unexplained weight loss, abdominal pain, significant dyspepsia, dysphagia, melena, rectal bleeding, or persistently small caliber stools are denied.      Objective:   Physical Exam Pertinent or positive findings include: She pushes up from the chair to ambulate. She ambulates slowly with broad gait. Grade 1/6 systolic murmur present.She has erythema of both shins with edema bilaterally. The left lower extremity is dressed. Pedal pulses are decreased. She has flexion contraction of the fifth right finger.   General appearance :adequately nourished; in no distress. BMI 52.62  Eyes: No conjunctival inflammation or scleral icterus is present.  Oral exam:  Lips and gums are healthy appearing.There is no oropharyngeal erythema or exudate noted. Dental hygiene is good.  Heart:   Normal rate and regular rhythm. S1 and S2 normal without gallop,click, rub or other extra sounds    Lungs:Chest clear to auscultation; no wheezes, rhonchi,rales ,or rubs present.No increased work of breathing.   Abdomen: bowel sounds normal, soft and non-tender without masses, organomegaly or hernias noted.  No guarding or rebound.  Vascular : all pulses equal ; no bruits present.  Skin:Warm & dry ; no tenting or jaundice   Lymphatic: No lymphadenopathy is noted about the head, neck, axilla  Neuro: Strength, tone & DTRs normal.       Assessment & Plan:  See Current Assessment & Plan in Problem List under specific Diagnosis

## 2014-08-09 NOTE — Patient Instructions (Addendum)
Minimal Blood Pressure Goal= AVERAGE < 140/90;  Ideal is an AVERAGE < 135/85. This AVERAGE should be calculated from @ least 5-7 BP readings taken @ different times of day on different days of week. You should not respond to isolated BP readings , but rather the AVERAGE for that week .Please bring your  blood pressure cuff to office visits to verify that it is reliable.It  can also be checked against the blood pressure device at the pharmacy. Finger or wrist cuffs are not dependable; an arm cuff is.  Fill the  prescription for the new BP medication if BP NOT @ goal based on  7 to 14 day average.

## 2014-08-09 NOTE — Assessment & Plan Note (Signed)
Monitored by Dr Estanislado Pandy

## 2014-08-09 NOTE — Assessment & Plan Note (Signed)
NMR Lipoprofile, TSH

## 2014-08-09 NOTE — Assessment & Plan Note (Signed)
Blood pressure goals reviewed. BMET reviewed & discussed

## 2014-08-10 NOTE — Assessment & Plan Note (Signed)
A1c

## 2014-08-10 NOTE — Assessment & Plan Note (Signed)
BMET  & LFTs reviewed; all WNL Na+ restriction

## 2014-08-12 ENCOUNTER — Other Ambulatory Visit: Payer: Self-pay | Admitting: Internal Medicine

## 2014-08-12 LAB — NMR LIPOPROFILE WITH LIPIDS
CHOLESTEROL, TOTAL: 256 mg/dL — AB (ref 100–199)
HDL Particle Number: 40.4 umol/L (ref >=30.5)
HDL Size: 9.7 nm (ref >=9.2)
HDL-C: 74 mg/dL (ref >39)
LDL CALC: 146 mg/dL — AB (ref 0–99)
LDL Particle Number: 1577 nmol/L — ABNORMAL HIGH (ref <1000)
LDL Size: 21.7 nm (ref >=20.8)
LP-IR Score: <25 (ref <=45)
Large HDL-P: 13 umol/L (ref >=4.8)
Large VLDL-P: 3.3 nmol/L — ABNORMAL HIGH (ref <=2.7)
Small LDL Particle Number: 458 nmol/L (ref <=527)
TRIGLYCERIDES: 180 mg/dL — AB (ref 0–149)
VLDL Size: 40.9 nm (ref <=46.6)

## 2014-08-16 DIAGNOSIS — I8311 Varicose veins of right lower extremity with inflammation: Secondary | ICD-10-CM | POA: Diagnosis not present

## 2014-08-16 DIAGNOSIS — I872 Venous insufficiency (chronic) (peripheral): Secondary | ICD-10-CM | POA: Diagnosis not present

## 2014-08-16 DIAGNOSIS — I8312 Varicose veins of left lower extremity with inflammation: Secondary | ICD-10-CM | POA: Diagnosis not present

## 2014-08-19 ENCOUNTER — Telehealth: Payer: Self-pay | Admitting: *Deleted

## 2014-08-19 ENCOUNTER — Encounter: Payer: Self-pay | Admitting: *Deleted

## 2014-08-19 NOTE — Telephone Encounter (Signed)
Patient requesting a letter for jury duty she states she will not be able to sit through it. Patient will be in this week for a follow up and would like to get it at that time

## 2014-08-19 NOTE — Telephone Encounter (Signed)
Note printed and ready for Wednesday.

## 2014-08-21 ENCOUNTER — Encounter: Payer: Self-pay | Admitting: Neurology

## 2014-08-21 ENCOUNTER — Ambulatory Visit (INDEPENDENT_AMBULATORY_CARE_PROVIDER_SITE_OTHER): Payer: Medicare Other | Admitting: Neurology

## 2014-08-21 VITALS — BP 140/78 | HR 84 | Ht 63.0 in | Wt 300.4 lb

## 2014-08-21 DIAGNOSIS — G7001 Myasthenia gravis with (acute) exacerbation: Secondary | ICD-10-CM

## 2014-08-21 NOTE — Progress Notes (Signed)
Follow-up Visit   Date: 08/21/2014    Kimberly [name] MRN: [id number] DOB: 12/19/1945   Interim History: Kimberly [name] is a 69 y.o. right-handed Caucasian female with with osteoarthritis, leg edema, leukoclastic vasculitis (2010) being followed by Dr. Estanislado Pandy, thoracic T1-2 laminectomy (2011), and cervical fusion at C5-6 (2008) by Dr. Vertell Limber returning to the clinic for follow-up of seronegative myasthenia gravis.  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  History of present illness: In 2011, patient started developing diplopia, blurred vision, ptosis, and gait abnormality for which she initially saw Dr. 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 She underwent MRI/A/V of the brain which showed T2 abnormality in the right cerebellar lesion so was referred her to Dr. Anna Genre at [facility]  who took over her care.  Per review of Dr. Alberteen Sam notes, the lesion was thought to be nonspecific and likely incidental as it would not explain all her symptoms.  She had two unsuccessful attempts with lumbar puncture and because of low suspicion for MS, it was not pursued again.  In the meantime, she also underwent repetitive nerve stimulation with single fiber EMG which showed evidence of neuromuscular junction disorder.   AChR antibodies were negative. At the time of her myasthenia gravis diagnosis, patient was already taking prednisone (possibly 30mg ?) for vasculitis, so was started on mestinon 60mg  three times daily which helped her muscle strength and resolved her diplopia.  She remained on prednisone but during the winters of 2013 and 2014, she was was struggling with pneumonia with pleural effusion requiring thoracentesis.  Because of this, she was started on methotrexate in January 2015 and reports not having pneumonia this past winter.  She has never been hospitalized for myasthenia, required intubation, or had any crisis.   She has been doing well  from an autoimmune stand-point and Dr. Patrecia Pour has been slowly tapering her prednisone by 1mg  every 2 month.  She is tolerating methotrexate 20mg  weekly.  She continues to have right ptosis intermittently especially when tired, but no other weakness, dysphagia, dysarthria, or diplopia.    UPDATE 08/21/2014:   She is currently taking prednisone 7mg  daily and reports having a rough summer because she feels weaker in her arms and legs and energy level is low.  She denies any double vision, dysphagia, or dysarthria.  She has some cramps of the vocal cord.  She continues to walk independently and has not had any falls.  She does endorse generalized pain and is not sure if this is the reason why she feels unwell.  She is taking mestinon 60mg  three times daily and has never been on a high dose of this.   Her mood is down because she cannot stay as active, but denies feelings of depression.   Medications:  Current Outpatient Prescriptions on File Prior to Visit  Medication Sig Dispense Refill  . augmented betamethasone dipropionate (DIPROLENE-AF) 0.05 % ointment Apply 1 application topically daily.     . Calcium Carbonate-Vit D-Min (CALCIUM 1200 PO) Take by mouth daily.      Marland Kitchen FOLIC ACID PO Take 2 tablets by mouth daily.    . furosemide (LASIX) 40 MG tablet Take 1 tablet (40 mg total) by mouth daily. 30 tablet 0  . Methotrexate Sodium (METHOTREXATE PO) Take by mouth. 8 tabs every friday    . metoprolol tartrate (LOPRESSOR) 25 MG tablet Take 1 tablet (25 mg total) by mouth 2 (two) times daily. 60 tablet 2  .  Multiple Vitamins-Minerals (CENTRUM SILVER PO) Take by mouth daily.      Marland Kitchen omeprazole (PRILOSEC OTC) 20 MG tablet Take 20 mg by mouth as needed.    . predniSONE (DELTASONE) 10 MG tablet Take 7 mg by mouth daily.    Marland Kitchen pyridostigmine (MESTINON) 60 MG tablet Take 1 tablet (60 mg total) by mouth 3 (three) times daily. 270 tablet 3  . Pyridoxine HCl (VITAMIN B-6 CR) 200 MG TBCR Take by mouth daily.      .  valsartan (DIOVAN) 320 MG tablet Take 1 tablet (320 mg total) by mouth daily. 30 tablet 0  . Zinc 50 MG CAPS Take by mouth daily.       No current facility-administered medications on file prior to visit.    Allergies:  Allergies  Allergen Reactions  . Azithromycin     Angioedema  . Levaquin [Levofloxacin In D5w] Other (See Comments)    D/T MYASTHENIA GRAVIS  . Tetracycline     Rash  . Sulfonamide Derivatives     Seizure age 76 (Note: probably fever induced)  . Amlodipine Besy-Benazepril Hcl     REACTION: cough    Review of Systems:  CONSTITUTIONAL: No fevers, chills, night sweats, or weight loss.  EYES: No visual changes or eye pain ENT: No hearing changes.  No history of nose bleeds.   RESPIRATORY: No cough, wheezing and shortness of breath.   CARDIOVASCULAR: Negative for chest pain, and palpitations.   GI: Negative for abdominal discomfort, blood in stools or black stools.  No recent change in bowel habits.   GU:  No history of incontinence.   MUSCLOSKELETAL: No history of joint pain or swelling.  No myalgias.   SKIN: Negative for lesions, rash, and itching.   ENDOCRINE: Negative for cold or heat intolerance, polydipsia or goiter.   PSYCH:  + depression or anxiety symptoms.   NEURO: As Above.   Vital Signs:  BP 140/78 mmHg  Pulse 84  Ht 5\' 3"  (1.6 m)  Wt 300 lb 6 oz (136.249 kg)  BMI 53.22 kg/m2  SpO2 97%  Neurological Exam: MENTAL STATUS including orientation to time, place, person, recent and remote memory, attention span and concentration, language, and fund of knowledge is normal.  Speech is not dysarthric.  CRANIAL NERVES:   Pupils equal round and reactive to light.  Normal conjugate, extra-ocular eye movements in all directions of gaze.  Subtle right ptosis without worsening with sustained upward gaze. Normal facial muscles.  Face is symmetric. Palate elevates symmetrically.  Tongue is midline.  MOTOR:  Motor strength is 5/5 in all extremities, except 5-/5  bilateral hip flexion.  No atrophy, fasciculations or abnormal movements.  No pronator drift.  Tone is normal.    MSRs:  Reflexes are 2+/4 throughout, except 1+ bilateral Achilles.  COORDINATION/GAIT:  No dysmetria. She cannot stand up from chair without using arms.  Gait slightly wide-based due to body habitus and mildly unsteady.  Data: Labs 08/08/2014:  HbA1c 5.3 Labs 04/09/2014:  TSH 3.217, B12 1422 Labs 01/30/2010:  AChR binding, blocking, and modulating antibody - negative, ANA negative, MPO antibody negative Labs 11/29/2009:  AChR binding, blocking, and modulating antibody - negative, ANA negative, dsDNA neg, RPR neg  MRI brain wo contrast 11/28/2009: 1.  Large area of T1 signal abnormality of the right cerebellar hemisphere, involving the white matter.  This unusual appearance is most suggestive of a demyelinating process such as multiple sclerosis, ADEM, or PML.  Given the patient's immunosuppression, PML should be  excluded.  Vasculitis is of high likelihood given the patient known history.    2.  Chronic parenchymal brain volume loss  MRA/V wo contrast 11/28/2009: 1.  Normal MRV of the brain 2.  No definite abnormality is noted on MRA of the brain.  There is unusual right posterior circulation branching pattern with an apparent fetal origin right PCA and an accessory posterior cerebral artery with an attached patent posterior communicating artery.  No significant stenosis.   MRI brain wwo contrast 11/29/2009: 1.  Stable appearance of asymmetric increased T2 signal within the right cerebellum with an underlying mass lesion or abnormal enhancement identified.  This finding is indeterminate, but may present underlying demyelinating disease.   RNS with SFEMG performed at [facility] 01/09/2010:  Abnormal study.  These electrodiagnostic findings are consistent with a disorder of neuromuscular transmission as can be seen in myasthenia gravis.   IMPRESSION: Mrs. Fetterolf is a 69 year-old female  with leukocytoclastic vasculitis on immunomodulatory therapy (MTX 20mg  weekly and prednisone taper) returning for follow-up of seronegative myasthenia gravis (diagnosed in 2011 by RNS and SFEMG at [facility]).  She has been able to taper her prednisone to 7mg  daily but started developing new weakness and fatigue.  Clinically, patient complains of worsening generalized malaise and weakness, however her exam is relatively stable.  Further, when she was first diagnosed symptoms were predominately bulbar and it would be atypical for MG to generalized > 2 years following diagnosis.  Therefore, I have to question whether her her malaise is due to worsening MG, deconditioning, or pain.    I recommended increasing her mestinon to 60mg  four times daily to see if there is any improvement.  If she does notice marked improvement, would be reasonable to conclude symptoms are related to MG exacerbation and may need to slow her prednisone taper to when the weather is cooler, as warm temperatures can make MG symptoms worse.    Mild gait unsteadiness, likely due to early and distal peripheral neuropathy, no signs of dysmetria to suggest cerebellar ataxia.  No evidence of diabetes, thyroid disease, or B12 deficiency.  Continue to follow.  Fall precautions discussed.  Right cerebellar lesion, less likely MS.  Two prior unsuccessful LPs and work-up at [facility]    PLAN/RECOMMENDATIONS:  1.  Increase mestinon 60mg  four times daily.  If there is a marked improvement in symptoms, would recommend she stay on prednisone 7mg  and hold off on tapering until later in the year 2.  Continue prednisone 7mg  and methotrexate 20mg  weekly 3.  Patient to call with update next week 4.  [place] letter for excuse given to patient 5.  Return to clinic in 2-3 months   The duration of this appointment visit was 30 minutes of face-to-face time with the patient.  Greater than 50% of this time was spent in counseling, explanation of diagnosis, planning of  further management, and coordination of care.   Thank you for allowing me to participate in patient's care.  If I can answer any additional questions, I would be pleased to do so.    Sincerely,    Benyamin Jeff K. Posey Pronto, DO

## 2014-08-21 NOTE — Patient Instructions (Addendum)
1.  Increase mestinon to 60mg  four times daily (8am, noon, 4pm, 8pm) 2.  Do not taper your prednisone at this time.  Continue prednisone 7mg  daily and methotrexate 20mg  weekly 3.  Return to clinic in 3 months

## 2014-08-21 NOTE — Progress Notes (Signed)
Note routed

## 2014-08-23 DIAGNOSIS — I83223 Varicose veins of left lower extremity with both ulcer of ankle and inflammation: Secondary | ICD-10-CM | POA: Diagnosis not present

## 2014-09-04 ENCOUNTER — Other Ambulatory Visit: Payer: Self-pay | Admitting: Emergency Medicine

## 2014-09-04 DIAGNOSIS — I10 Essential (primary) hypertension: Secondary | ICD-10-CM

## 2014-09-04 MED ORDER — VALSARTAN 320 MG PO TABS: 320.0000 mg | ORAL_TABLET | Freq: Every day | ORAL | Status: DC

## 2014-09-04 MED ORDER — FUROSEMIDE 40 MG PO TABS: 40.0000 mg | ORAL_TABLET | Freq: Every day | ORAL | Status: DC

## 2014-09-06 ENCOUNTER — Ambulatory Visit: Payer: Medicare Other | Admitting: Neurology

## 2014-10-01 DIAGNOSIS — M5030 Other cervical disc degeneration, unspecified cervical region: Secondary | ICD-10-CM | POA: Diagnosis not present

## 2014-10-01 DIAGNOSIS — I776 Arteritis, unspecified: Secondary | ICD-10-CM | POA: Diagnosis not present

## 2014-10-01 DIAGNOSIS — M170 Bilateral primary osteoarthritis of knee: Secondary | ICD-10-CM | POA: Diagnosis not present

## 2014-10-01 DIAGNOSIS — M25511 Pain in right shoulder: Secondary | ICD-10-CM | POA: Diagnosis not present

## 2014-10-08 ENCOUNTER — Other Ambulatory Visit: Payer: Self-pay | Admitting: Internal Medicine

## 2014-10-09 ENCOUNTER — Other Ambulatory Visit: Payer: Self-pay | Admitting: Emergency Medicine

## 2014-11-06 DIAGNOSIS — Z79899 Other long term (current) drug therapy: Secondary | ICD-10-CM | POA: Diagnosis not present

## 2014-11-17 DIAGNOSIS — Z23 Encounter for immunization: Secondary | ICD-10-CM | POA: Diagnosis not present

## 2014-11-25 ENCOUNTER — Ambulatory Visit (INDEPENDENT_AMBULATORY_CARE_PROVIDER_SITE_OTHER): Payer: Medicare Other | Admitting: Neurology

## 2014-11-25 ENCOUNTER — Encounter: Payer: Self-pay | Admitting: Neurology

## 2014-11-25 VITALS — BP 126/64 | HR 63 | Ht 63.0 in | Wt 275.2 lb

## 2014-11-25 DIAGNOSIS — G7000 Myasthenia gravis without (acute) exacerbation: Secondary | ICD-10-CM | POA: Diagnosis not present

## 2014-11-25 DIAGNOSIS — R2681 Unsteadiness on feet: Secondary | ICD-10-CM | POA: Diagnosis not present

## 2014-11-25 MED ORDER — PYRIDOSTIGMINE BROMIDE 60 MG PO TABS: 60.0000 mg | ORAL_TABLET | Freq: Four times a day (QID) | ORAL | Status: DC

## 2014-11-25 NOTE — Progress Notes (Signed)
Follow-up Visit   Date: 11/25/2014    TIFFNAY SAXENA MRN: [id number] DOB: [birth year]   Interim History: RIYA CADDELL is a 69 y.o. right-handed Caucasian female with with osteoarthritis, leg edema, leukoclastic vasculitis (2010) being followed by Dr. Estanislado Pandy, thoracic T1-2 laminectomy (2011), and cervical fusion at C5-6 (2008) by Dr. Vertell Limber returning to the clinic for follow-up of seronegative myasthenia gravis.  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  History of present illness: In 2011, patient started developing diplopia, blurred vision, ptosis, and gait abnormality for which she initially saw Dr. 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 She underwent MRI/A/V of the brain which showed T2 abnormality in the right cerebellar lesion so was referred her to Dr. Anna Genre at [facility]  who took over her care.  Per review of Dr. Alberteen Sam notes, the lesion was thought to be nonspecific and likely incidental as it would not explain all her symptoms.  She had two unsuccessful attempts with lumbar puncture and because of low suspicion for MS, it was not pursued again.  In the meantime, she also underwent repetitive nerve stimulation with single fiber EMG which showed evidence of neuromuscular junction disorder.   AChR antibodies were negative. At the time of her myasthenia gravis diagnosis, patient was already taking prednisone (possibly 30mg ?) for vasculitis, so was started on mestinon 60mg  three times daily which helped her muscle strength and resolved her diplopia.  She remained on prednisone but during the winters of 2013 and 2014, she was was struggling with pneumonia with pleural effusion requiring thoracentesis.  Because of this, she was started on methotrexate in January 2015 and reports not having pneumonia this past winter.  She has never been hospitalized for myasthenia, required intubation, or had any crisis.   She has been doing well  from an autoimmune stand-point and Dr. Estanislado Pandy has been slowly tapering her prednisone by 1mg  every 2 month.  She is tolerating methotrexate 20mg  weekly.  She continues to have right ptosis intermittently especially when tired, but no other weakness, dysphagia, dysarthria, or diplopia.    UPDATE 08/21/2014:   She is taking prednisone 7mg  daily and reports having a rough summer because she feels weaker in her arms and legs and energy level is low.  She does endorse generalized pain and is not sure if this is the reason why she feels unwell.  She is taking mestinon 60mg  three times daily and has never been on a high dose of this.   Her mood is down because she cannot stay as active, but denies feelings of depression.   UPDATE 11/25/2014:  She had noticed a huge difference in her stamina since increased her mestinon to 60mg  four times daily.  In fact, she has lost 25lb since her last visit!  She tried self-tapering her prednisone to 6mg  in September, but within two weeks developed worsening pain so has been back on 7mg  daily.  She continues to have imbalance, no recent falls.   No new complaints.   She denies any double vision, dysphagia, or dysarthria.  Medications:  Current Outpatient Prescriptions on File Prior to Visit  Medication Sig Dispense Refill  . augmented betamethasone dipropionate (DIPROLENE-AF) 0.05 % ointment Apply 1 application topically daily.     . Calcium Carbonate-Vit D-Min (CALCIUM 1200 PO) Take by mouth daily.      . folic acid (FOLVITE) 1 MG tablet TAKE 2 TABLETS BY MOUTH ONCE DAILY 180 tablet 3  .  furosemide (LASIX) 40 MG tablet Take 1 tablet (40 mg total) by mouth daily. 30 tablet 5  . Methotrexate Sodium (METHOTREXATE PO) Take by mouth. 8 tabs every friday    . metoprolol tartrate (LOPRESSOR) 25 MG tablet Take 1 tablet (25 mg total) by mouth 2 (two) times daily. 60 tablet 2  . Multiple Vitamins-Minerals (CENTRUM SILVER PO) Take by mouth daily.      Marland Kitchen omeprazole (PRILOSEC OTC)  20 MG tablet Take 20 mg by mouth as needed.    . predniSONE (DELTASONE) 10 MG tablet Take 7 mg by mouth daily.    . Pyridoxine HCl (VITAMIN B-6 CR) 200 MG TBCR Take by mouth daily.      . valsartan (DIOVAN) 320 MG tablet Take 1 tablet (320 mg total) by mouth daily. 30 tablet 5  . Zinc 50 MG CAPS Take by mouth daily.       No current facility-administered medications on file prior to visit.    Allergies:  Allergies  Allergen Reactions  . Azithromycin     Angioedema  . Levaquin [Levofloxacin In D5w] Other (See Comments)    D/T MYASTHENIA GRAVIS  . Tetracycline     Rash  . Sulfonamide Derivatives     Seizure age 58 (Note: probably fever induced)  . Amlodipine Besy-Benazepril Hcl     REACTION: cough    Review of Systems:  CONSTITUTIONAL: No fevers, chills, night sweats, or weight loss.  EYES: No visual changes or eye pain ENT: No hearing changes.  No history of nose bleeds.   RESPIRATORY: No cough, wheezing and shortness of breath.   CARDIOVASCULAR: Negative for chest pain, and palpitations.   GI: Negative for abdominal discomfort, blood in stools or black stools.  No recent change in bowel habits.   GU:  No history of incontinence.   MUSCLOSKELETAL: No history of joint pain or swelling.  No myalgias.   SKIN: Negative for lesions, rash, and itching.   ENDOCRINE: Negative for cold or heat intolerance, polydipsia or goiter.   PSYCH:  + depression or anxiety symptoms.   NEURO: As Above.   Vital Signs:  BP 126/64 mmHg  Pulse 63  Ht 5\' 3"  (1.6 m)  Wt 275 lb 3 oz (124.824 kg)  BMI 48.76 kg/m2  Neurological Exam: MENTAL STATUS including orientation to time, place, person, recent and remote memory, attention span and concentration, language, and fund of knowledge is normal.  Speech is not dysarthric.  CRANIAL NERVES:   Pupils equal round and reactive to light.  Normal conjugate, extra-ocular eye movements in all directions of gaze.  Subtle right ptosis without worsening with  sustained upward gaze. Normal facial muscles.  Face is symmetric. Palate elevates symmetrically.  Tongue is midline.  MOTOR:  Motor strength is 5/5 in all extremities.  No atrophy, fasciculations or abnormal movements.  No pronator drift.  Tone is normal.    COORDINATION/GAIT:   Gait slightly wide-based due to body habitus and mildly unsteady.  Data: Labs 08/08/2014:  HbA1c 5.3 Labs 04/09/2014:  TSH 3.217, B12 1422 Labs 01/30/2010:  AChR binding, blocking, and modulating antibody - negative, ANA negative, MPO antibody negative Labs 11/29/2009:  AChR binding, blocking, and modulating antibody - negative, ANA negative, dsDNA neg, RPR neg  MRI brain wo contrast 11/28/2009: 1.  Large area of T1 signal abnormality of the right cerebellar hemisphere, involving the white matter.  This unusual appearance is most suggestive of a demyelinating process such as multiple sclerosis, ADEM, or PML.  Given the  patient's immunosuppression, PML should be excluded.  Vasculitis is of high likelihood given the patient known history.    2.  Chronic parenchymal brain volume loss  MRA/V wo contrast 11/28/2009: 1.  Normal MRV of the brain 2.  No definite abnormality is noted on MRA of the brain.  There is unusual right posterior circulation branching pattern with an apparent fetal origin right PCA and an accessory posterior cerebral artery with an attached patent posterior communicating artery.  No significant stenosis.   MRI brain wwo contrast 11/29/2009: 1.  Stable appearance of asymmetric increased T2 signal within the right cerebellum with an underlying mass lesion or abnormal enhancement identified.  This finding is indeterminate, but may present underlying demyelinating disease.   RNS with SFEMG performed at [facility] 01/09/2010:  Abnormal study.  These electrodiagnostic findings are consistent with a disorder of neuromuscular transmission as can be seen in myasthenia gravis.   IMPRESSION: Mrs. Blaydes is a 69  year-old female with leukocytoclastic vasculitis on immunomodulatory therapy (MTX 20mg  weekly and prednisone taper) returning for follow-up of seronegative myasthenia gravis (diagnosed in 2011 by RNS and SFEMG at [facility]  Since increasing her mestinon to 60mg  four times daily, she has been doing very well.  From a myasthenic stand-point, I am comfortable with tapering prednisone to 6mg  daily but she would need to discuss this with her rheumatologist.  Mild gait unsteadiness, likely due to early and distal peripheral neuropathy, no signs of dysmetria to suggest cerebellar ataxia.  No evidence of diabetes, thyroid disease, or B12 deficiency.  She does tend to walk with her legs close together and may benefit from gait training, which she is interested in. Fall precautions discussed.  Right cerebellar lesion, less likely MS.  Two prior unsuccessful LPs and work-up at [facility].    PLAN/RECOMMENDATIONS:  1.  Continue mestinon 60mg  four times daily.  She may try to skip one of her doses to see if symptoms are controlled on mestinon 60mg  TID 2.  OK to reduce prednisone to 6mg  daily from [facility] standpoint, but need to discuss this with Dr. Estanislado Pandy - patient will call later this week 3.  Continue methotrexate 20mg  daily  4.  Praised her for weight loss! 5.  Start gait training  Return to clinic in 5 months or sooner as needed   The duration of this appointment visit was 30 minutes of face-to-face time with the patient.  Greater than 50% of this time was spent in counseling, explanation of diagnosis, planning of further management, and coordination of care.   Thank you for allowing me to participate in patient's care.  If I can answer any additional questions, I would be pleased to do so.    Sincerely,    Caylie Sandquist K. Posey Pronto, DO

## 2014-11-25 NOTE — Patient Instructions (Addendum)
1.  You're looking great!  Keep up the good work 2.  Continues your medications as you are taking them.  You can try to skip your mestinon and see how you do 3.  From a myasthenia standpoint, symptoms are stable you are okay to reduce prednisone to 6mg  daily, but please discuss with Dr. Patrecia Pour as she is using this to manage your vascultis 4.  Referral to PT for gait training  Return to clinic in 5 months

## 2014-11-26 NOTE — Progress Notes (Signed)
Note faxed.

## 2015-02-03 ENCOUNTER — Other Ambulatory Visit: Payer: Self-pay

## 2015-02-03 DIAGNOSIS — I10 Essential (primary) hypertension: Secondary | ICD-10-CM

## 2015-02-03 MED ORDER — FUROSEMIDE 40 MG PO TABS: 40.0000 mg | ORAL_TABLET | Freq: Every day | ORAL | Status: DC

## 2015-02-03 MED ORDER — VALSARTAN 320 MG PO TABS: 320.0000 mg | ORAL_TABLET | Freq: Every day | ORAL | Status: DC

## 2015-02-13 ENCOUNTER — Telehealth: Payer: Self-pay | Admitting: Emergency Medicine

## 2015-02-13 NOTE — Telephone Encounter (Signed)
Spoke with pts husband. He will have pt call back in to schedule appt with  Dr Quay Burow. Will send Lasix and Wlasartan to [facility] once scheduled.

## 2015-02-14 ENCOUNTER — Ambulatory Visit: Payer: Medicare Other | Admitting: Pulmonary Disease

## 2015-02-17 ENCOUNTER — Encounter: Payer: Self-pay | Admitting: Internal Medicine

## 2015-02-17 ENCOUNTER — Ambulatory Visit (INDEPENDENT_AMBULATORY_CARE_PROVIDER_SITE_OTHER): Payer: Medicare Other | Admitting: Internal Medicine

## 2015-02-17 ENCOUNTER — Other Ambulatory Visit (INDEPENDENT_AMBULATORY_CARE_PROVIDER_SITE_OTHER): Payer: Medicare Other

## 2015-02-17 VITALS — BP 116/86 | HR 78 | Temp 98.7°F | Ht 62.5 in | Wt 265.4 lb

## 2015-02-17 DIAGNOSIS — R938 Abnormal findings on diagnostic imaging of other specified body structures: Secondary | ICD-10-CM | POA: Diagnosis not present

## 2015-02-17 DIAGNOSIS — R509 Fever, unspecified: Secondary | ICD-10-CM | POA: Diagnosis not present

## 2015-02-17 DIAGNOSIS — J90 Pleural effusion, not elsewhere classified: Secondary | ICD-10-CM | POA: Diagnosis not present

## 2015-02-17 DIAGNOSIS — R1032 Left lower quadrant pain: Secondary | ICD-10-CM | POA: Diagnosis not present

## 2015-02-17 DIAGNOSIS — R9389 Abnormal findings on diagnostic imaging of other specified body structures: Secondary | ICD-10-CM

## 2015-02-17 LAB — BASIC METABOLIC PANEL
BUN: 14 mg/dL (ref 6–23)
CALCIUM: 9.4 mg/dL (ref 8.4–10.5)
CO2: 29 mEq/L (ref 19–32)
Chloride: 101 mEq/L (ref 96–112)
Creatinine, Ser: 0.69 mg/dL (ref 0.40–1.20)
GFR: 89.82 mL/min (ref >60.00)
GLUCOSE: 111 mg/dL — AB (ref 70–99)
Potassium: 4.2 mEq/L (ref 3.5–5.1)
SODIUM: 139 meq/L (ref 135–145)

## 2015-02-17 LAB — CBC WITH DIFFERENTIAL/PLATELET
BASOS PCT: 0.4 % (ref 0.0–3.0)
Basophils Absolute: 0 10*3/uL (ref 0.0–0.1)
EOS PCT: 0.3 % (ref 0.0–5.0)
Eosinophils Absolute: 0 10*3/uL (ref 0.0–0.7)
HCT: 36.2 % (ref 36.0–46.0)
HEMOGLOBIN: 11.4 g/dL — AB (ref 12.0–15.0)
LYMPHS PCT: 9.2 % — AB (ref 12.0–46.0)
Lymphs Abs: 1 10*3/uL (ref 0.7–4.0)
MCHC: 31.7 g/dL (ref 30.0–36.0)
MCV: 86.2 fl (ref 78.0–100.0)
MONO ABS: 0.8 10*3/uL (ref 0.1–1.0)
Monocytes Relative: 7 % (ref 3.0–12.0)
NEUTROS ABS: 9.3 10*3/uL — AB (ref 1.4–7.7)
Neutrophils Relative %: 83.1 % — ABNORMAL HIGH (ref 43.0–77.0)
Platelets: 384 10*3/uL (ref 150.0–400.0)
RBC: 4.2 Mil/uL (ref 3.87–5.11)
RDW: 16.3 % — AB (ref 11.5–15.5)
WBC: 11.2 10*3/uL — ABNORMAL HIGH (ref 4.0–10.5)

## 2015-02-17 LAB — MAGNESIUM: Magnesium: 1.7 mg/dL (ref 1.5–2.5)

## 2015-02-17 LAB — HEPATIC FUNCTION PANEL
ALT: 13 U/L (ref 0–35)
AST: 16 U/L (ref 0–37)
Albumin: 3.9 g/dL (ref 3.5–5.2)
Alkaline Phosphatase: 78 U/L (ref 39–117)
BILIRUBIN DIRECT: 0.1 mg/dL (ref 0.0–0.3)
BILIRUBIN TOTAL: 0.5 mg/dL (ref 0.2–1.2)
Total Protein: 7.4 g/dL (ref 6.0–8.3)

## 2015-02-17 LAB — PHOSPHORUS: Phosphorus: 3.3 mg/dL (ref 2.3–4.6)

## 2015-02-17 NOTE — Patient Instructions (Signed)
ICD-9-CM ICD-10-CM   1. Fever, unspecified 780.60 R50.9   2. Left lower quadrant pain 789.04 R10.32   3. Abnormal chest CT 793.2 R93.8   4. Pleural effusion 511.9 J90    Do cbc with diff, bmet, mag, phos, lft 02/17/2015  Followup - do HRCT chest wo contrast  - supine and prone images anytime now to next appt - return to see me first available appt to discuss CT results

## 2015-02-17 NOTE — Progress Notes (Signed)
Subjective:     Patient ID: Kimberly [name], female   DOB: 11-08-1945, 70 y.o.   MRN: [id number]  HPI   pcpc [name], MD Neuro  - [facility] - Dr Currie Paris 02/17/2015  Chief Complaint  Patient presents with  . Follow-up    Former [place] pt. Pt c/o fever (102), chills that started on 2/1 and lasted till 2/4. Pt c/o non prod cough and DOE - at baseline.     Former [place] patient with recurrent right pleural effusion in the setting of myasthenia gravis and autoimmune disease apparently not otherwise specified but on methotrexate and prednisone followed by Dr. Keturah Barre. Last right pleural effusion was May 2014. Last seen by Dr. Lupita Leash in February 2016 and at that time chest x-ray was free of pleural effusion. She now presents for her annual follow-up. Reports that from a respiratory standpoint she is doing well without any recurrence of pleural effusion. She continues to suffer from myasthenia symptoms such as proximal muscle weakness but it is difficult for her to stand up but nevertheless she is functional without a cane or a walker at home. This is all baseline. 4-5 days ago had 102 fever associated with left lower abdominal cramps. 2 days later by 02/14/2015 or 02/15/2015 and the fever resolved. She still has some mild left lower abdominal cramp but it is improving. There was never any diarrhea or constipation or melena or bloody stools or vomiting. No respiratory change. She is worried about her fever. She has not discussed this with her primary care physician.she is immunosuppressed.    lab review  - Shows last pleural effusion was May 2014 at which time it was an exudate.  - CT chest was in 2015: Personally visualized. There is some nonspecific interstitial infiltrates     Current outpatient prescriptions:  .  augmented betamethasone dipropionate (DIPROLENE-AF) 0.05 % ointment, Apply 1 application topically daily. , Disp: , Rfl:  .  Calcium Carbonate-Vit D-Min (CALCIUM 1200 PO), Take by mouth  daily.  , Disp: , Rfl:  .  folic acid (FOLVITE) 1 MG tablet, TAKE 2 TABLETS BY MOUTH ONCE DAILY, Disp: 180 tablet, Rfl: 3 .  furosemide (LASIX) 40 MG tablet, Take 1 tablet (40 mg total) by mouth daily., Disp: 30 tablet, Rfl: 5 .  Methotrexate Sodium (METHOTREXATE PO), Take by mouth. 8 tabs every friday, Disp: , Rfl:  .  metoprolol tartrate (LOPRESSOR) 25 MG tablet, Take 1 tablet (25 mg total) by mouth 2 (two) times daily., Disp: 60 tablet, Rfl: 2 .  Multiple Vitamins-Minerals (CENTRUM SILVER PO), Take by mouth daily.  , Disp: , Rfl:  .  omeprazole (PRILOSEC OTC) 20 MG tablet, Take 20 mg by mouth as needed., Disp: , Rfl:  .  predniSONE (DELTASONE) 10 MG tablet, Take 7 mg by mouth daily., Disp: , Rfl:  .  pyridostigmine (MESTINON) 60 MG tablet, Take 1 tablet (60 mg total) by mouth 4 (four) times daily., Disp: 370 tablet, Rfl: 3 .  Pyridoxine HCl (VITAMIN B-6 CR) 200 MG TBCR, Take by mouth daily.  , Disp: , Rfl:  .  valsartan (DIOVAN) 320 MG tablet, Take 1 tablet (320 mg total) by mouth daily., Disp: 30 tablet, Rfl: 3   Immunization History  Administered Date(s) Administered  . Influenza, High Dose Seasonal PF 09/11/2012  . Influenza,inj,Quad PF,36+ Mos 10/12/2014  . Influenza-Unspecified 10/11/2013  . Pneumococcal Polysaccharide-23 04/19/2013    Allergies  Allergen Reactions  . Azithromycin  Angioedema  . Levaquin [Levofloxacin In D5w] Other (See Comments)    D/T MYASTHENIA GRAVIS  . Tetracycline     Rash  . Sulfonamide Derivatives     Seizure age 32 (Note: probably fever induced)  . Amlodipine Besy-Benazepril Hcl     REACTION: cough       Review of Systems Per hpi    Objective:   Physical Exam  Constitutional: She is oriented to person, place, and time. She appears well-developed and well-nourished. No distress.  Obese Body mass index is 47.74 kg/(m^2).   HENT:  Head: Normocephalic and atraumatic.  Right Ear: External ear normal.  Left Ear: External ear normal.   Mouth/Throat: Oropharynx is clear and moist. No oropharyngeal exudate.  Eyes: Conjunctivae and EOM are normal. Pupils are equal, round, and reactive to light. Right eye exhibits no discharge. Left eye exhibits no discharge. No scleral icterus.  Neck: Normal range of motion. Neck supple. No JVD present. No tracheal deviation present. No thyromegaly present.  Cardiovascular: Normal rate, regular rhythm, normal heart sounds and intact distal pulses.  Exam reveals no gallop and no friction rub.   No murmur heard. Pulmonary/Chest: Effort normal and breath sounds normal. No respiratory distress. She has no wheezes. She has no rales. She exhibits no tenderness.  ? Basal crackles  Abdominal: Soft. Bowel sounds are normal. She exhibits no distension and no mass. There is no tenderness. There is no rebound and no guarding.  Feels extremely mild tenderness in the left lower quadrant on palpation  Musculoskeletal: Normal range of motion. She exhibits no edema or tenderness.  Lymphadenopathy:    She has no cervical adenopathy.  Neurological: She is alert and oriented to person, place, and time. She has normal reflexes. No cranial nerve deficit. She exhibits normal muscle tone. Coordination normal.  Difficulty standing up  Skin: Skin is warm and dry. No rash noted. She is not diaphoretic. No erythema. No pallor.  Psychiatric: She has a normal mood and affect. Her behavior is normal. Judgment and thought content normal.  Vitals reviewed.   Filed Vitals:   02/17/15 1519  BP: 116/86  Pulse: 78  Temp: 98.7 F (37.1 C)  TempSrc: Oral  Height: 5' 2.5" (1.588 m)  Weight: 265 lb 6.4 oz (120.385 kg)  SpO2: 98%         Assessment:       ICD-9-CM ICD-10-CM   1. Fever, unspecified 780.60 R50.9   2. Left lower quadrant pain 789.04 R10.32   3. Abnormal chest CT 793.2 R93.8   4. Pleural effusion 511.9 J90        Plan:     #fever and left lower quadrant pain - DO not think fever is pulmonary  related. But ? Viral. ? First episode mild diverticulitis that is self resolving. She wants atleast blood work due to her immune suppression. Wil send of cbc, bmet, lft. IF abnormal or symptoms recu, have told her to d/w PC [name], MD   #Pl effusion and abnormal CT  - need to ensure no recurrence. Need to ensure no ILD. Do HRCT and return for fu   Dr. Brand Males, M.D., [facility].C.P Pulmonary and Critical Care Medicine Staff Physician [place] [facility] Pager: [phone], If no answer or between  15:00h - 7:00h: call [phone]  02/17/2015 3:50 PM

## 2015-02-19 ENCOUNTER — Other Ambulatory Visit: Payer: Self-pay | Admitting: Emergency Medicine

## 2015-02-19 DIAGNOSIS — I10 Essential (primary) hypertension: Secondary | ICD-10-CM

## 2015-02-19 MED ORDER — FUROSEMIDE 40 MG PO TABS: 40.0000 mg | ORAL_TABLET | Freq: Every day | ORAL | Status: DC

## 2015-02-19 MED ORDER — VALSARTAN 320 MG PO TABS: 320.0000 mg | ORAL_TABLET | Freq: Every day | ORAL | Status: DC

## 2015-02-20 ENCOUNTER — Ambulatory Visit (INDEPENDENT_AMBULATORY_CARE_PROVIDER_SITE_OTHER)
Admission: RE | Admit: 2015-02-20 | Discharge: 2015-02-20 | Disposition: A | Discharge status: Home / Self Care | Payer: Medicare Other | Source: Ambulatory Visit | Attending: Internal Medicine | Admitting: Internal Medicine

## 2015-02-20 DIAGNOSIS — R1032 Left lower quadrant pain: Secondary | ICD-10-CM

## 2015-02-20 DIAGNOSIS — J90 Pleural effusion, not elsewhere classified: Secondary | ICD-10-CM | POA: Diagnosis not present

## 2015-02-20 DIAGNOSIS — R938 Abnormal findings on diagnostic imaging of other specified body structures: Secondary | ICD-10-CM

## 2015-02-20 DIAGNOSIS — R9389 Abnormal findings on diagnostic imaging of other specified body structures: Secondary | ICD-10-CM

## 2015-02-24 ENCOUNTER — Telehealth: Payer: Self-pay | Admitting: Internal Medicine

## 2015-02-24 NOTE — Telephone Encounter (Signed)
Triage, sending to you because This is kind of overdue. Sorry.you can send to [facility] if needed   Let Mr. Verderosa know  . CT chest without interstitial lung disease or pleural effusion  - She has coronary artery calcification: She had cardiac stress test before? If she has not had one in the last few years might be worthwhile visiting with her cardiologist. If so refer her to one  - Labs essentially normal except white count slightly high at 11,400 at this is improved compared to a year ago and I would review this is high side of normal. Chemistry and liver function test normal. But magnesium was on the low side of normal at 1.7 mg percent. She could take magnesium oxide 400 mg once daily. This might help her feel a little bit stronger    Ct Chest High Resolution  02/20/2015  CLINICAL DATA:  70 year old female with clinical concern for interstitial lung disease. History of pleural effusion. EXAM: CT CHEST WITHOUT CONTRAST TECHNIQUE: Multidetector CT imaging of the chest was performed following the standard protocol without intravenous contrast. High resolution imaging of the lungs, as well as inspiratory and expiratory imaging, was performed. COMPARISON:  Chest CT 03/22/2013. FINDINGS: Mediastinum/Lymph Nodes: Heart size is normal. There is no significant pericardial fluid, thickening or pericardial calcification. There is atherosclerosis of the thoracic aorta, the great vessels of the mediastinum and the coronary arteries, including calcified atherosclerotic plaque in the left anterior descending and left circumflex coronary arteries. No pathologically enlarged mediastinal or hilar lymph nodes. Please note that accurate exclusion of hilar adenopathy is limited on noncontrast CT scans. Esophagus is unremarkable in appearance. No axillary lymphadenopathy. Lungs/Pleura: No acute consolidative airspace disease. No pleural effusions. No suspicious appearing pulmonary nodules or masses. High-resolution images  demonstrate no significant areas of ground-glass attenuation, subpleural reticulation, parenchymal banding, traction bronchiectasis or frank honeycombing. Inspiratory and expiratory imaging is unremarkable. Upper abdomen: Status post cholecystectomy. Musculoskeletal: There are no aggressive appearing lytic or blastic lesions noted in the visualized portions of the skeleton. IMPRESSION: 1. No findings to suggest interstitial lung disease. 2. No acute findings in the thorax. 3. Atherosclerosis, including 2 vessel coronary artery disease. Please note that although the presence of coronary artery calcium documents the presence of coronary artery disease, the severity of this disease and any potential stenosis cannot be assessed on this non-gated CT examination. Assessment for potential risk factor modification, dietary therapy or pharmacologic therapy may be warranted, if clinically indicated. Electronically Signed   By: Vinnie Langton M.D.   On: 02/20/2015 16:49    PULMONARY No results for input(s): PHART, PCO2ART, PO2ART, HCO3, TCO2, O2SAT in the last 168 hours.  Invalid input(s): PCO2, PO2  CBC  Recent Labs Lab 02/17/15 1616  HGB 11.4*  HCT 36.2  WBC 11.2*  PLT 384.0    COAGULATION No results for input(s): INR in the last 168 hours.  CARDIAC  No results for input(s): TROPONINI in the last 168 hours. No results for input(s): PROBNP in the last 168 hours.   CHEMISTRY  Recent Labs Lab 02/17/15 1616  NA 139  K 4.2  CL 101  CO2 29  GLUCOSE 111*  BUN 14  CREATININE 0.69  CALCIUM 9.4  MG 1.7  PHOS 3.3   Estimated Creatinine Clearance: 83.8 mL/min (by C-G formula based on Cr of 0.69).   LIVER  Recent Labs Lab 02/17/15 1616  AST 16  ALT 13  ALKPHOS 78  BILITOT 0.5  PROT 7.4  ALBUMIN 3.9  INFECTIOUS No results for input(s): LATICACIDVEN, PROCALCITON in the last 168 hours.   ENDOCRINE CBG (last 3)  No results for input(s): GLUCAP in the last 72  hours.       IMAGING x48h  - image(s) personally visualized  -   highlighted in bold No results found.

## 2015-02-24 NOTE — Telephone Encounter (Signed)
Returned call  [phone]

## 2015-02-24 NOTE — Telephone Encounter (Signed)
lmtcb x1 for pt. Will route to [place] to follow up on.

## 2015-02-24 NOTE — Telephone Encounter (Signed)
Spoke with pt. She is aware of her results. States that she has an appointment with MR on Friday, she wants to discuss the stress test with him before we order it. Nothing further was needed.

## 2015-02-28 ENCOUNTER — Ambulatory Visit (INDEPENDENT_AMBULATORY_CARE_PROVIDER_SITE_OTHER): Payer: Medicare Other | Admitting: Internal Medicine

## 2015-02-28 ENCOUNTER — Encounter: Payer: Self-pay | Admitting: Internal Medicine

## 2015-02-28 VITALS — BP 132/74 | HR 73 | Ht 62.5 in | Wt 263.6 lb

## 2015-02-28 DIAGNOSIS — R1032 Left lower quadrant pain: Secondary | ICD-10-CM | POA: Diagnosis not present

## 2015-02-28 DIAGNOSIS — I2510 Atherosclerotic heart disease of native coronary artery without angina pectoris: Secondary | ICD-10-CM | POA: Insufficient documentation

## 2015-02-28 DIAGNOSIS — I2584 Coronary atherosclerosis due to calcified coronary lesion: Secondary | ICD-10-CM | POA: Diagnosis not present

## 2015-02-28 DIAGNOSIS — J90 Pleural effusion, not elsewhere classified: Secondary | ICD-10-CM | POA: Diagnosis not present

## 2015-02-28 NOTE — Patient Instructions (Signed)
ICD-9-CM ICD-10-CM   1. Coronary artery calcification 414.00 I25.10    414.4 I25.84   2. Left lower quadrant pain 789.04 R10.32   3. Pleural effusion 511.9 J90    #Coronary artery calcification - Refer Dr. Aundra Dubin or Dr. Skeet Latch at [facility]  #Left lower quadrant pain - I suspect he had mild diverticulitis. This is now resolved. I would recommend that you discuss with her primary care physician about a referral to gastroenterology for colonoscopy which you need anyways for screening purposes  #Pleural effusion - This has resolved  #Other issues - Please take print out of our most recent lab work so you can sure your rheumatologist  #Follow-up - 1 year or sooner if needed

## 2015-02-28 NOTE — Progress Notes (Signed)
Subjective:     Patient ID: Kimberly [name], female   DOB: [birth year], 70 y.o.   MRN: [id number]  HPI   pcpc [name], MD Neuro  - [facility] - Dr Currie Paris 02/17/2015  Chief Complaint  Patient presents with  . Follow-up    Former [place] pt. Pt c/o fever (102), chills that started on 2/1 and lasted till 2/4. Pt c/o non prod cough and DOE - at baseline.     Former [place] patient with recurrent right pleural effusion in the setting of myasthenia gravis and autoimmune disease apparently not otherwise specified but on methotrexate and prednisone followed by Dr. Keturah Barre. Last right pleural effusion was May 2014. Last seen by Dr. Lupita Leash in February 2016 and at that time chest x-ray was free of pleural effusion. She now presents for her annual follow-up. Reports that from a respiratory standpoint she is doing well without any recurrence of pleural effusion. She continues to suffer from myasthenia symptoms such as proximal muscle weakness but it is difficult for her to stand up but nevertheless she is functional without a cane or a walker at home. This is all baseline. 4-5 days ago had 102 fever associated with left lower abdominal cramps. 2 days later by 02/14/2015 or 02/15/2015 and the fever resolved. She still has some mild left lower abdominal cramp but it is improving. There was never any diarrhea or constipation or melena or bloody stools or vomiting. No respiratory change. She is worried about her fever. She has not discussed this with her primary care physician.she is immunosuppressed.    lab review  - Shows last pleural effusion was May 2014 at which time it was an exudate.  - CT chest was in 2015: Personally visualized. There is some nonspecific interstitial infiltrates    OV 02/28/2015  Chief Complaint  Patient presents with  . Follow-up    Pt here to discuss CT results. Pt states her breathing is doing well and the pain in BIL ribs has improved since last OV.       here to discuss  results    LLQ pain - resolved. WBC slightly high. I told her this was likely mild diveritculitis. She had delayed screening colonoscopy. Recommended she see GI about this and/or talk to PCP  Pleural effusion - none present on CT. CT has no ild either  New finding - coronary artery calcification+. She wanted to talk a lot more about this. Reports recently (weeks to months ago) x 2 had feeling of tachycardia with radiating jaw pain. Otherwise no angina. Wants to be referred to cardiologist whou would be thorough and conservative   has a past medical history of Myasthenia gravis without exacerbation ([place]); Diplopia; Family history of ischemic heart disease; Unspecified vitamin D deficiency; Other and unspecified hyperlipidemia; Vasculitis ([place]); Myalgia and myositis, unspecified; GERD (gastroesophageal reflux disease); Psoriasis; Arthritis; and Heart murmur.   reports that she has never smoked. She has never used smokeless tobacco.  Past Surgical History  Procedure Laterality Date  . Appendectomy      & exploratory for inflammation; Dr Hassell Done  . Total abdominal hysterectomy w/ bilateral salpingoophorectomy      For Fibroids & endometriosis  . Cholecystectomy      for stones  . Shoulder surgery      Left & Right  . Thoracentesis      for peripneumonic effusion  . Laminectomy  2010    T1-2; Dr Vertell Limber  . Cervical fusion  C5-6; Dr Vertell Limber    Allergies  Allergen Reactions  . Azithromycin     Angioedema  . Levaquin [Levofloxacin In D5w] Other (See Comments)    D/T MYASTHENIA GRAVIS  . Tetracycline     Rash  . Sulfonamide Derivatives     Seizure age 14 (Note: probably fever induced)  . Amlodipine Besy-Benazepril Hcl     REACTION: cough    Immunization History  Administered Date(s) Administered  . Influenza, High Dose Seasonal PF 09/11/2012  . Influenza,inj,Quad PF,36+ Mos 10/12/2014  . Influenza-Unspecified 10/11/2013  . Pneumococcal Polysaccharide-23 04/19/2013    Family  History  Problem Relation Age of Onset  . Hypertension Sister   . Hyperthyroidism Sister     [place]  . Heart attack Mother 56  . Heart failure Mother   . Stroke Paternal Grandmother     in 80s  . Lung cancer Father   . Cancer Father     lung  . Diabetes Neg Hx   . Hyperthyroidism Sister     [place]     Current outpatient prescriptions:  .  augmented betamethasone dipropionate (DIPROLENE-AF) 0.05 % ointment, Apply 1 application topically daily. , Disp: , Rfl:  .  Calcium Carbonate-Vit D-Min (CALCIUM 1200 PO), Take by mouth daily.  , Disp: , Rfl:  .  folic acid (FOLVITE) 1 MG tablet, TAKE 2 TABLETS BY MOUTH ONCE DAILY, Disp: 180 tablet, Rfl: 3 .  furosemide (LASIX) 40 MG tablet, Take 1 tablet (40 mg total) by mouth daily., Disp: 90 tablet, Rfl: 1 .  Methotrexate Sodium (METHOTREXATE PO), Take by mouth. 8 tabs every friday, Disp: , Rfl:  .  metoprolol tartrate (LOPRESSOR) 25 MG tablet, Take 1 tablet (25 mg total) by mouth 2 (two) times daily., Disp: 60 tablet, Rfl: 2 .  Multiple Vitamins-Minerals (CENTRUM SILVER PO), Take by mouth daily.  , Disp: , Rfl:  .  omeprazole (PRILOSEC OTC) 20 MG tablet, Take 20 mg by mouth as needed., Disp: , Rfl:  .  predniSONE (DELTASONE) 10 MG tablet, Take 7 mg by mouth daily., Disp: , Rfl:  .  pyridostigmine (MESTINON) 60 MG tablet, Take 1 tablet (60 mg total) by mouth 4 (four) times daily., Disp: 370 tablet, Rfl: 3 .  Pyridoxine HCl (VITAMIN B-6 CR) 200 MG TBCR, Take by mouth daily.  , Disp: , Rfl:  .  valsartan (DIOVAN) 320 MG tablet, Take 1 tablet (320 mg total) by mouth daily., Disp: 90 tablet, Rfl: 1    Review of Systems     Objective:   Physical Exam Filed Vitals:   02/28/15 1214  BP: 132/74  Pulse: 73  Height: 5' 2.5" (1.588 m)  Weight: 263 lb 9.6 oz (119.568 kg)  SpO2: 97%    Discussion only    Assessment:       ICD-9-CM ICD-10-CM   1. Coronary artery calcification 414.00 I25.10 Ambulatory referral to Cardiology   414.4 I25.84    2. Left lower quadrant pain 789.04 R10.32   3. Pleural effusion 511.9 J90        Plan:       #Coronary artery calcification - Refer Dr. Aundra Dubin or Dr. Skeet Latch at [facility]  #Left lower quadrant pain - I suspect he had mild diverticulitis. This is now resolved. I would recommend that you discuss with her primary care physician about a referral to gastroenterology for colonoscopy which you need anyways for screening purposes  #Pleural effusion - This has resolved  #Other issues -  Please take print out of our most recent lab work so you can sure your rheumatologist  #Follow-up - 1 year or sooner if needed    (> 50% of this 15 min visit spent in face to face counseling or/and coordination of care)   Dr. Brand Males, M.D., [facility].C.P Pulmonary and Critical Care Medicine Staff Physician [place] [facility] Pager: [phone], If no answer or between  15:00h - 7:00h: call [phone]  02/28/2015 5:13 PM

## 2015-03-03 DIAGNOSIS — G7000 Myasthenia gravis without (acute) exacerbation: Secondary | ICD-10-CM | POA: Diagnosis not present

## 2015-03-03 DIAGNOSIS — M170 Bilateral primary osteoarthritis of knee: Secondary | ICD-10-CM | POA: Diagnosis not present

## 2015-03-03 DIAGNOSIS — Z09 Encounter for follow-up examination after completed treatment for conditions other than malignant neoplasm: Secondary | ICD-10-CM | POA: Diagnosis not present

## 2015-03-03 DIAGNOSIS — L408 Other psoriasis: Secondary | ICD-10-CM | POA: Diagnosis not present

## 2015-03-10 ENCOUNTER — Telehealth: Payer: Self-pay | Admitting: Internal Medicine

## 2015-03-10 DIAGNOSIS — I10 Essential (primary) hypertension: Secondary | ICD-10-CM

## 2015-03-10 MED ORDER — VALSARTAN 320 MG PO TABS: 320.0000 mg | ORAL_TABLET | Freq: Every day | ORAL | Status: DC

## 2015-03-10 MED ORDER — FUROSEMIDE 40 MG PO TABS: 40.0000 mg | ORAL_TABLET | Freq: Every day | ORAL | Status: DC

## 2015-03-10 NOTE — Telephone Encounter (Signed)
Pt called in and said that she needs urgent refill on her bp meds.  Sh has a tranfer appt with burn in April.  Can meds me filled till then.    [facility]

## 2015-03-14 ENCOUNTER — Encounter (INDEPENDENT_AMBULATORY_CARE_PROVIDER_SITE_OTHER): Payer: Medicare Other

## 2015-03-14 ENCOUNTER — Encounter: Payer: Self-pay | Admitting: Cardiovascular Disease

## 2015-03-14 ENCOUNTER — Ambulatory Visit (INDEPENDENT_AMBULATORY_CARE_PROVIDER_SITE_OTHER): Payer: Medicare Other | Admitting: Cardiovascular Disease

## 2015-03-14 VITALS — BP 152/96 | HR 78 | Ht 62.5 in | Wt 263.4 lb

## 2015-03-14 DIAGNOSIS — Z13220 Encounter for screening for lipoid disorders: Secondary | ICD-10-CM | POA: Diagnosis not present

## 2015-03-14 DIAGNOSIS — Z79899 Other long term (current) drug therapy: Secondary | ICD-10-CM | POA: Diagnosis not present

## 2015-03-14 DIAGNOSIS — R079 Chest pain, unspecified: Secondary | ICD-10-CM

## 2015-03-14 DIAGNOSIS — R002 Palpitations: Secondary | ICD-10-CM

## 2015-03-14 DIAGNOSIS — I2510 Atherosclerotic heart disease of native coronary artery without angina pectoris: Secondary | ICD-10-CM | POA: Diagnosis not present

## 2015-03-14 DIAGNOSIS — I2584 Coronary atherosclerosis due to calcified coronary lesion: Secondary | ICD-10-CM

## 2015-03-14 DIAGNOSIS — I10 Essential (primary) hypertension: Secondary | ICD-10-CM

## 2015-03-14 MED ORDER — SPIRONOLACTONE 25 MG PO TABS: 25.0000 mg | ORAL_TABLET | Freq: Every day | ORAL | Status: DC

## 2015-03-14 NOTE — Patient Instructions (Signed)
Medication Instructions: Dr Oval Linsey has recommended making the following medication changes: START Spironolcatone 25 mg - take 1 tablet by mouth daily  >>A new prescription has been sent to your pharmacy electronically  Labwork: Your physician recommends that you return for lab work at your earliest [place].  Testing/Procedures: 1. Exercise Nuclear Stress test - Your physician has requested that you have en exercise stress myoview. For further information please visit [email or web address]. Please follow instruction sheet, as given.  2. 30-Day Cardiac Event monitor - Your physician has recommended that you wear an event monitor. Event monitors are medical devices that record the heart's electrical activity. Doctors most often [place] these monitors to diagnose arrhythmias. Arrhythmias are problems with the speed or rhythm of the heartbeat. The monitor is a small, portable device. You can wear one while you do your normal daily activities. This is usually used to diagnose what is causing palpitations/syncope (passing out). THIS WILL BE MAILED TO YOU.  Follow-up: Dr Oval Linsey recommends that you schedule a follow-up appointment in 1 month.  If you need a refill on your cardiac medications before your next appointment, please call your pharmacy.

## 2015-03-14 NOTE — Progress Notes (Signed)
Cardiology Office Note   Date:  03/15/2015   ID:  Kimberly [name], DOB 1945/07/18, MRN [id number]  PCP:  [name], MD  Cardiologist:   Sharol Harness, MD   Chief Complaint  Patient presents with  . New Patient (Initial Visit)     no chest pain, occassional shortness of breath with exertions, has edema, has pain or cramping in legs, occassional lightheadedness or dizziness, has fatigue      History of Present Illness: Kimberly [name] is a 70 y.o. female with hypertension, coronary artery calcification, myasthenia gravis, recurrent R pleural effusion, hyperlipidemia who presents for evaluation of asymptomatic coronary calcifications.  Ms. Kimberly [name] saw Dr. Chase Caller or routine follow-up on 02/28/15.  She had a chest CT for follow-up on her pleural effusion.  She was noted to have coronary calcification.  Ms. Kimberly [name] denies any chest pain or shortness of breath.  She has chronic lower extremity edema due to vasculitis.  She likes to walk for exercise but is limited by her myasthenia.  She denies orthopnea or PND.  She has noted 3 episodes of tachycardia that awakened her from sleeping.  This started three months ago and the last episode was three weeks ago.  She awakens from sleep and feels like her heart is racing irregularly.  She checked her Fitbit and it was 162 bpm.  The episodes last for hours and the most recent one lasted all night.  The symptoms get better when she gets up and walks around.  There is no associated chest pain, lightheadedness or shortness of breath.  This has never happened in the daytime or with exertion.  Ms. Kimberly [name] checks her blood pressure at work and it typically runs in the 140-160s/70-90s.     Past Medical History  Diagnosis Date  . Myasthenia gravis without exacerbation (HCC)     [facility], Dr Anna Genre  . Diplopia   . Family history of ischemic heart disease   . Unspecified vitamin D deficiency     Dr Estanislado Pandy  . Other and unspecified  hyperlipidemia   . Vasculitis ([place])     Dr Estanislado Pandy  . Myalgia and myositis, unspecified   . GERD (gastroesophageal reflux disease)   . Psoriasis     plantar ;Dr Jarome Matin  . Arthritis   . Heart murmur     Past Surgical History  Procedure Laterality Date  . Appendectomy      & exploratory for inflammation; Dr Hassell Done  . Total abdominal hysterectomy w/ bilateral salpingoophorectomy      For Fibroids & endometriosis  . Cholecystectomy      for stones  . Shoulder surgery      Left & Right  . Thoracentesis      for peripneumonic effusion  . Laminectomy  2010    T1-2; Dr Vertell Limber  . Cervical fusion      C5-6; Dr Vertell Limber     Current Outpatient Prescriptions  Medication Sig Dispense Refill  . augmented betamethasone dipropionate (DIPROLENE-AF) 0.05 % ointment Apply 1 application topically daily.     . Calcium Carbonate-Vit D-Min (CALCIUM 1200 PO) Take by mouth daily.      . folic acid (FOLVITE) 1 MG tablet TAKE 2 TABLETS BY MOUTH ONCE DAILY 180 tablet 3  . furosemide (LASIX) 40 MG tablet Take 1 tablet (40 mg total) by mouth daily. 30 tablet 1  . Methotrexate Sodium (METHOTREXATE PO) Take by mouth. 8 tabs every friday    . Multiple Vitamins-Minerals (CENTRUM  SILVER PO) Take by mouth daily.      Marland Kitchen omeprazole (PRILOSEC OTC) 20 MG tablet Take 20 mg by mouth as needed.    . predniSONE (DELTASONE) 10 MG tablet Take 7 mg by mouth daily.    Marland Kitchen pyridostigmine (MESTINON) 60 MG tablet Take 1 tablet (60 mg total) by mouth 4 (four) times daily. 370 tablet 3  . Pyridoxine HCl (VITAMIN B-6 CR) 200 MG TBCR Take by mouth daily.      . valsartan (DIOVAN) 320 MG tablet Take 1 tablet (320 mg total) by mouth daily. 30 tablet 1  . spironolactone (ALDACTONE) 25 MG tablet Take 1 tablet (25 mg total) by mouth daily. 30 tablet 11   No current facility-administered medications for this visit.    Allergies:   Azithromycin; Levaquin; Tetracycline; Sulfonamide derivatives; and Amlodipine besy-benazepril hcl     Social History:  The patient  reports that she has never smoked. She has never used smokeless tobacco. She reports that she does not drink alcohol or use illicit drugs.   Family History:  The patient's family history includes Cancer in her father; Heart attack (age of onset: 80) in her mother; Heart failure in her mother; Hypertension in her sister; Hyperthyroidism in her sister and sister; Lung cancer in her father; Stroke in her paternal grandmother. There is no history of Diabetes.    ROS:  Please see the history of present illness.   Otherwise, review of systems are positive for none.   All other systems are reviewed and negative.    PHYSICAL EXAM: VS:  BP 152/96 mmHg  Pulse 78  Ht 5' 2.5" (1.588 m)  Wt 119.466 kg (263 lb 6 oz)  BMI 47.37 kg/m2 , BMI Body mass index is 47.37 kg/(m^2). GENERAL:  Well appearing.  No acute distress. HEENT:  Pupils equal round and reactive, fundi not visualized, oral mucosa unremarkable NECK:  No jugular venous distention, waveform within normal limits, carotid upstroke brisk and symmetric, no bruits, no thyromegaly LYMPHATICS:  No cervical adenopathy LUNGS:  Clear to auscultation bilaterally HEART:  RRR.  PMI not displaced or sustained,S1 and S2 within normal limits, no S3, no S4, no clicks, no rubs, no murmurs ABD:  Flat, positive bowel sounds normal in frequency in pitch, no bruits, no rebound, no guarding, no midline pulsatile mass, no hepatomegaly, no splenomegaly EXT:  2 plus pulses throughout, trace edema, no cyanosis no clubbing SKIN:  Bilateral lower extremity erythema NEURO:  Cranial nerves II through XII grossly intact, motor grossly intact throughout PSYCH:  Cognitively intact, oriented to person place and time  EKG:  EKG is ordered today. The ekg ordered today demonstrates sinus rhythm rate 79 bpm.     Recent Labs: 08/09/2014: TSH 2.20 02/17/2015: ALT 13; BUN 14; Creatinine, Ser 0.69; Hemoglobin 11.4*; Magnesium 1.7; Platelets 384.0;  Potassium 4.2; Sodium 139    Lipid Panel    Component Value Date/Time   CHOL 256* 08/09/2014 0928   TRIG 180* 08/09/2014 0928   HDL 74 08/09/2014 0928   LDLCALC 146* 08/09/2014 0928      Wt Readings from Last 3 Encounters:  03/14/15 119.466 kg (263 lb 6 oz)  02/28/15 119.568 kg (263 lb 9.6 oz)  02/17/15 120.385 kg (265 lb 6.4 oz)      ASSESSMENT AND PLAN:  # Coronary calcification: Ms. Geer's calcification indicate that she has some underlying coronary artery disease.  The severity of her CAD is unknown.  She does not have symptoms at rest, but she  does not exert herself enough to know if she has exertional symptoms.  Therefore, we will refer her for Lexiscan Cardiolite to evaluate for ischemia.  We will also check her lipids.  Given that she has Myasthenia, statins must be used with caution, if at all. We will start aspirin.    # Hypertension: BP is poorly-controlled today.  She reports that is has been consistently elevated at work too.  Given that she has myasthenia, we will not use beta blockers or CCB.  She takes lasix for edema, so we will not start a diuretic.  Continue valsartan and we will start spironolactone 25 mg daily.  Repeat BMP at follow up appointment.  # Palpitations: Ms. Baldonado symptoms are concerning for atrial fibrillation.  The fact that it awakens her from sleeping indicates that OSA may be the cause.  However, she denies snoring (husband concurs), daytime somnolence, or feeling tired upon awakening.  We will obtain a 30 day event monitor to determine the underlying rhythm.  We will also check a CBC and thyroid function.     Current medicines are reviewed at length with the patient today.  The patient does not have concerns regarding medicines.  The following changes have been made:  Start spironolactone  Labs/ tests ordered today include:   Orders Placed This Encounter  Procedures  . Comprehensive metabolic panel  . CBC  . Lipid panel  . TSH    . Myocardial Perfusion Imaging  . Cardiac event monitor  . EKG 12-Lead     Disposition:   FU with Shakeya Kerkman C. Oval Linsey, MD, [facility] in 1 month    This note was written with the assistance of speech recognition software.  Please excuse any transcriptional errors.  Signed, Mylinh Cragg C. Oval Linsey, MD, [facility]  03/15/2015 3:02 AM    [facility]

## 2015-03-15 ENCOUNTER — Encounter: Payer: Self-pay | Admitting: Cardiovascular Disease

## 2015-03-17 ENCOUNTER — Telehealth: Payer: Self-pay | Admitting: Cardiovascular Disease

## 2015-03-17 NOTE — Telephone Encounter (Signed)
Pt saw Dr Oval Linsey on Friday. She started her on a new medicine,her question is should still take the Furosemide?

## 2015-03-17 NOTE — Telephone Encounter (Signed)
Left detailed msg as indicated OK on DPR sheet, advised to continue furosemide along w spironolactone, call if q's.

## 2015-03-19 ENCOUNTER — Encounter: Payer: Self-pay | Admitting: Cardiovascular Disease

## 2015-03-25 ENCOUNTER — Telehealth (HOSPITAL_COMMUNITY): Payer: Self-pay

## 2015-03-25 NOTE — Telephone Encounter (Signed)
Encounter complete. 

## 2015-03-27 ENCOUNTER — Ambulatory Visit (HOSPITAL_COMMUNITY)
Admission: RE | Admit: 2015-03-27 | Discharge: 2015-03-27 | Disposition: A | Discharge status: Home / Self Care | Payer: Medicare Other | Source: Ambulatory Visit | Attending: Cardiology | Admitting: Cardiology

## 2015-03-27 DIAGNOSIS — R42 Dizziness and giddiness: Secondary | ICD-10-CM | POA: Diagnosis not present

## 2015-03-27 DIAGNOSIS — R002 Palpitations: Secondary | ICD-10-CM | POA: Diagnosis not present

## 2015-03-27 DIAGNOSIS — I10 Essential (primary) hypertension: Secondary | ICD-10-CM | POA: Insufficient documentation

## 2015-03-27 DIAGNOSIS — R079 Chest pain, unspecified: Secondary | ICD-10-CM | POA: Insufficient documentation

## 2015-03-27 DIAGNOSIS — R5383 Other fatigue: Secondary | ICD-10-CM | POA: Insufficient documentation

## 2015-03-27 DIAGNOSIS — Z6841 Body Mass Index (BMI) 40.0 and over, adult: Secondary | ICD-10-CM | POA: Insufficient documentation

## 2015-03-27 DIAGNOSIS — E669 Obesity, unspecified: Secondary | ICD-10-CM | POA: Insufficient documentation

## 2015-03-27 DIAGNOSIS — I2510 Atherosclerotic heart disease of native coronary artery without angina pectoris: Secondary | ICD-10-CM | POA: Insufficient documentation

## 2015-03-27 DIAGNOSIS — R9439 Abnormal result of other cardiovascular function study: Secondary | ICD-10-CM | POA: Diagnosis not present

## 2015-03-27 DIAGNOSIS — Z8249 Family history of ischemic heart disease and other diseases of the circulatory system: Secondary | ICD-10-CM | POA: Insufficient documentation

## 2015-03-27 DIAGNOSIS — I2584 Coronary atherosclerosis due to calcified coronary lesion: Secondary | ICD-10-CM | POA: Insufficient documentation

## 2015-03-27 DIAGNOSIS — R0609 Other forms of dyspnea: Secondary | ICD-10-CM | POA: Insufficient documentation

## 2015-03-27 MED ORDER — TECHNETIUM TC 99M SESTAMIBI GENERIC - CARDIOLITE
31.2000 | Freq: Once | INTRAVENOUS | Status: AC | PRN
  Administered 2015-03-27: 31.2 via INTRAVENOUS

## 2015-03-27 MED ORDER — REGADENOSON 0.4 MG/5ML IV SOLN
0.4000 mg | Freq: Once | INTRAVENOUS | Status: AC
  Administered 2015-03-27: 0.4 mg via INTRAVENOUS

## 2015-03-28 ENCOUNTER — Other Ambulatory Visit: Payer: Self-pay | Admitting: Internal Medicine

## 2015-03-28 ENCOUNTER — Ambulatory Visit (HOSPITAL_COMMUNITY)
Admission: RE | Admit: 2015-03-28 | Discharge: 2015-03-28 | Disposition: A | Discharge status: Home / Self Care | Payer: Medicare Other | Source: Ambulatory Visit | Attending: Cardiology | Admitting: Cardiology

## 2015-03-28 LAB — MYOCARDIAL PERFUSION IMAGING
CHL CUP NUCLEAR SDS: 12
CHL CUP NUCLEAR SRS: 0
CHL CUP RESTING HR STRESS: 71 {beats}/min
LV sys vol: 24 mL
LVDIAVOL: 83 mL (ref 46–106)
NUC STRESS TID: 0.63
Peak HR: 89 {beats}/min
SSS: 12

## 2015-03-28 MED ORDER — TECHNETIUM TC 99M SESTAMIBI GENERIC - CARDIOLITE
32.0000 | Freq: Once | INTRAVENOUS | Status: AC | PRN
  Administered 2015-03-28: 32 via INTRAVENOUS

## 2015-04-01 DIAGNOSIS — R002 Palpitations: Secondary | ICD-10-CM | POA: Diagnosis not present

## 2015-04-02 ENCOUNTER — Telehealth: Payer: Self-pay | Admitting: Cardiovascular Disease

## 2015-04-02 DIAGNOSIS — Z13220 Encounter for screening for lipoid disorders: Secondary | ICD-10-CM | POA: Diagnosis not present

## 2015-04-02 DIAGNOSIS — Z79899 Other long term (current) drug therapy: Secondary | ICD-10-CM | POA: Diagnosis not present

## 2015-04-02 NOTE — Telephone Encounter (Signed)
Pt is returning your call about some test results. She will be at that contact number until 5pm

## 2015-04-02 NOTE — Telephone Encounter (Signed)
-----   Message from Skeet Latch, MD sent at 03/28/2015  1:21 PM EDT ----- Stress test was abnormal and is concerning that some areas of her heart are not getting enough blood flow.  Please either refer for cardiac catheterization.  If she is more comfortable, follow up to discuss first.

## 2015-04-02 NOTE — Telephone Encounter (Signed)
Advised patient of results  Patient would prefer to come for visit and discuss cath with Dr Oval Linsey, scheduled appointment

## 2015-04-07 ENCOUNTER — Telehealth: Payer: Self-pay | Admitting: Internal Medicine

## 2015-04-07 ENCOUNTER — Encounter (HOSPITAL_COMMUNITY): Payer: Self-pay | Admitting: *Deleted

## 2015-04-07 DIAGNOSIS — K5732 Diverticulitis of large intestine without perforation or abscess without bleeding: Secondary | ICD-10-CM | POA: Diagnosis not present

## 2015-04-07 DIAGNOSIS — Z872 Personal history of diseases of the skin and subcutaneous tissue: Secondary | ICD-10-CM | POA: Diagnosis not present

## 2015-04-07 DIAGNOSIS — K5730 Diverticulosis of large intestine without perforation or abscess without bleeding: Secondary | ICD-10-CM | POA: Diagnosis not present

## 2015-04-07 DIAGNOSIS — K5792 Diverticulitis of intestine, part unspecified, without perforation or abscess without bleeding: Secondary | ICD-10-CM | POA: Diagnosis not present

## 2015-04-07 DIAGNOSIS — Z8639 Personal history of other endocrine, nutritional and metabolic disease: Secondary | ICD-10-CM | POA: Insufficient documentation

## 2015-04-07 DIAGNOSIS — R011 Cardiac murmur, unspecified: Secondary | ICD-10-CM | POA: Insufficient documentation

## 2015-04-07 DIAGNOSIS — R1032 Left lower quadrant pain: Secondary | ICD-10-CM | POA: Diagnosis present

## 2015-04-07 DIAGNOSIS — Z8679 Personal history of other diseases of the circulatory system: Secondary | ICD-10-CM | POA: Insufficient documentation

## 2015-04-07 DIAGNOSIS — Z8669 Personal history of other diseases of the nervous system and sense organs: Secondary | ICD-10-CM | POA: Insufficient documentation

## 2015-04-07 DIAGNOSIS — Z8739 Personal history of other diseases of the musculoskeletal system and connective tissue: Secondary | ICD-10-CM | POA: Insufficient documentation

## 2015-04-07 LAB — COMPREHENSIVE METABOLIC PANEL
ALT: 18 U/L (ref 14–54)
AST: 26 U/L (ref 15–41)
Albumin: 3.4 g/dL — ABNORMAL LOW (ref 3.5–5.0)
Alkaline Phosphatase: 91 U/L (ref 38–126)
Anion gap: 14 (ref 5–15)
BUN: 17 mg/dL (ref 6–20)
CHLORIDE: 101 mmol/L (ref 101–111)
CO2: 25 mmol/L (ref 22–32)
Calcium: 9.2 mg/dL (ref 8.9–10.3)
Creatinine, Ser: 1.07 mg/dL — ABNORMAL HIGH (ref 0.44–1.00)
GFR, EST NON AFRICAN AMERICAN: 52 mL/min — AB (ref >60)
Glucose, Bld: 109 mg/dL — ABNORMAL HIGH (ref 65–99)
POTASSIUM: 3.5 mmol/L (ref 3.5–5.1)
Sodium: 140 mmol/L (ref 135–145)
Total Bilirubin: 1 mg/dL (ref 0.3–1.2)
Total Protein: 7.5 g/dL (ref 6.5–8.1)

## 2015-04-07 LAB — URINALYSIS, ROUTINE W REFLEX MICROSCOPIC
GLUCOSE, UA: 100 mg/dL — AB
Ketones, ur: 40 mg/dL — AB
Nitrite: NEGATIVE
PH: 5.5 (ref 5.0–8.0)
Protein, ur: 100 mg/dL — AB
Specific Gravity, Urine: >1.03 — ABNORMAL HIGH (ref 1.005–1.030)

## 2015-04-07 LAB — CBC
HEMATOCRIT: 35.5 % — AB (ref 36.0–46.0)
Hemoglobin: 10.9 g/dL — ABNORMAL LOW (ref 12.0–15.0)
MCH: 26.6 pg (ref 26.0–34.0)
MCHC: 30.7 g/dL (ref 30.0–36.0)
MCV: 86.6 fL (ref 78.0–100.0)
Platelets: 441 10*3/uL — ABNORMAL HIGH (ref 150–400)
RBC: 4.1 MIL/uL (ref 3.87–5.11)
RDW: 15.1 % (ref 11.5–15.5)
WBC: 13.8 10*3/uL — AB (ref 4.0–10.5)

## 2015-04-07 LAB — URINE MICROSCOPIC-ADD ON

## 2015-04-07 LAB — LIPASE, BLOOD: LIPASE: 31 U/L (ref 11–51)

## 2015-04-07 NOTE — ED Notes (Signed)
Pt refusing pain medication.

## 2015-04-07 NOTE — ED Notes (Signed)
Pt c/o constant left lower abdominal pain since Thursday. Pain intermittently radiates to left groin. Pt denies constipation, denies dysuria. Pt states her urine has a strong smell and darker. Pt denies n/v/d. Pt reports fever 103 Saturday night and Sunday.

## 2015-04-07 NOTE — Telephone Encounter (Signed)
Patient Name: Kimberly [name]  DOB: 26-Nov-1945    Initial Comment Caller states c/o left lower abdominal pain, fever 100.0   Nurse Assessment  Nurse: Raphael Gibney, RN, Vera Date/Time (Eastern Time): 04/07/2015 9:39:32 AM  Confirm and document reason for call. If symptomatic, describe symptoms. You must click the next button to save text entered. ---Caller states she is having left lower abd pain. Temp 103 on Saturday and Sunday. Temp 100 now. Pain is moderate now. Pain intensifies at times and is severe at times. Pain comes and goes but pain is there more than no pain. Pain radiates into her left groin. No vomiting or diarrhea.  Has the patient traveled out of the country within the last 30 days? ---No  Does the patient have any new or worsening symptoms? ---Yes  Will a triage be completed? ---Yes  Related visit to physician within the last 2 weeks? ---No  Does the PT have any chronic conditions? (i.e. diabetes, asthma, etc.) ---Yes  List chronic conditions. ---immune disorder; possible heart disorder; myasthenia gravis  Is this a behavioral health or substance abuse call? ---No     Guidelines    Guideline Title Affirmed Question Affirmed Notes  Abdominal Pain - Female [1] MILD-MODERATE pain AND [2] constant AND [3] present > 2 hours    Final Disposition User   See Physician within 4 Hours (or PCP triage) Raphael Gibney, RN, Vera    Comments  No appts available at [facility] and pt does not want to go to the ER, urgent care or go to another office. States she does not want to go to urgent care or the ER due to her immune disorder and being exposed to flu, etc. Would like someone to call her back from the office.   Referrals  GO TO FACILITY REFUSED   Disagree/Comply: Comply

## 2015-04-08 ENCOUNTER — Emergency Department (HOSPITAL_COMMUNITY): Payer: Medicare Other

## 2015-04-08 ENCOUNTER — Emergency Department (HOSPITAL_COMMUNITY)
Admission: EM | Admit: 2015-04-08 | Discharge: 2015-04-08 | Disposition: A | Discharge status: Home / Self Care | Payer: Medicare Other | Attending: Physician Assistant | Admitting: Physician Assistant

## 2015-04-08 DIAGNOSIS — K5792 Diverticulitis of intestine, part unspecified, without perforation or abscess without bleeding: Secondary | ICD-10-CM | POA: Diagnosis not present

## 2015-04-08 DIAGNOSIS — K5732 Diverticulitis of large intestine without perforation or abscess without bleeding: Secondary | ICD-10-CM | POA: Diagnosis not present

## 2015-04-08 MED ORDER — SULFAMETHOXAZOLE-TRIMETHOPRIM 800-160 MG PO TABS: 1.0000 | ORAL_TABLET | Freq: Two times a day (BID) | ORAL | Status: AC

## 2015-04-08 MED ORDER — CEPHALEXIN 250 MG PO CAPS
500.0000 mg | ORAL_CAPSULE | Freq: Once | ORAL | Status: AC
  Administered 2015-04-08: 500 mg via ORAL
  Filled 2015-04-08: qty 2

## 2015-04-08 MED ORDER — METRONIDAZOLE 500 MG PO TABS
500.0000 mg | ORAL_TABLET | Freq: Once | ORAL | Status: AC
  Administered 2015-04-08: 500 mg via ORAL
  Filled 2015-04-08: qty 1

## 2015-04-08 MED ORDER — SULFAMETHOXAZOLE-TRIMETHOPRIM 800-160 MG PO TABS
1.0000 | ORAL_TABLET | Freq: Once | ORAL | Status: AC
  Administered 2015-04-08: 1 via ORAL
  Filled 2015-04-08: qty 1

## 2015-04-08 MED ORDER — METRONIDAZOLE 500 MG PO TABS: 500.0000 mg | ORAL_TABLET | Freq: Two times a day (BID) | ORAL | Status: DC

## 2015-04-08 MED ORDER — IOPAMIDOL (ISOVUE-300) INJECTION 61%
INTRAVENOUS | Status: AC
  Administered 2015-04-08: 100 mL
  Filled 2015-04-08: qty 100

## 2015-04-08 NOTE — Discharge Instructions (Signed)
Please take these antibiotics to help with the infection in your bowel. Please return with increasing pain, fever or increasing symptoms or other concerns.    Diverticulitis Diverticulitis is when small pockets that have formed in your colon (large intestine) become infected or swollen. HOME CARE  Follow your doctor's instructions.  Follow a special diet if told by your doctor.  When you feel better, your doctor may tell you to change your diet. You may be told to eat a lot of fiber. Fruits and vegetables are good sources of fiber. Fiber makes it easier to poop (have bowel movements).  Take supplements or probiotics as told by your doctor.  Only take medicines as told by your doctor.  Keep all follow-up visits with your doctor. GET HELP IF:  Your pain does not get better.  You have a hard time eating food.  You are not pooping like normal. GET HELP RIGHT AWAY IF:  Your pain gets worse.  Your problems do not get better.  Your problems suddenly get worse.  You have a fever.  You keep throwing up (vomiting).  You have bloody or black, tarry poop (stool). MAKE SURE YOU:   Understand these instructions.  Will watch your condition.  Will get help right away if you are not doing well or get worse.   This information is not intended to replace advice given to you by your health care provider. Make sure you discuss any questions you have with your health care provider.   Document Released: 06/16/2007 Document Revised: 01/02/2013 Document Reviewed: 11/22/2012 Elsevier Interactive Patient Education [employer].

## 2015-04-08 NOTE — ED Notes (Signed)
Patient transported to CT 

## 2015-04-08 NOTE — ED Notes (Signed)
MD at bedside. 

## 2015-04-08 NOTE — ED Provider Notes (Addendum)
CSN: [id number]     Arrival date & time 04/07/15  2108 History  By signing my name below, I, Helane Gunther, attest that this documentation has been prepared under the direction and in the presence of Davelyn Gwinn Julio Alm, MD. Electronically Signed: Helane Gunther, ED Scribe. 04/08/2015. 3:25 AM.    Chief Complaint  Patient presents with  . Abdominal Pain   The history is provided by the patient. No language interpreter was used.   HPI Comments: Kimberly [name] is a 70 y.o. female with a PMHx of heart murmur and myasthenia gravis, as well as a PSHx of hysterectomy, appendectomy, and cholecystectomy who presents to the Emergency Department complaining of waxing and waning LLQ abdominal pain onset 5 days ago. She notes the pain feels similar to when she had gallstones, with occasional shooting pains into her groin. She reports associated malodorous urine, but denies dysuria. She also states that she was told she had a hernia by her PCP.   Past Medical History  Diagnosis Date  . Myasthenia gravis without exacerbation (HCC)     [facility], Dr Anna Genre  . Diplopia   . Family history of ischemic heart disease   . Unspecified vitamin D deficiency     Dr Estanislado Pandy  . Other and unspecified hyperlipidemia   . Vasculitis ([place])     Dr Estanislado Pandy  . Myalgia and myositis, unspecified   . GERD (gastroesophageal reflux disease)   . Psoriasis     plantar ;Dr Jarome Matin  . Arthritis   . Heart murmur    Past Surgical History  Procedure Laterality Date  . Appendectomy      & exploratory for inflammation; Dr Hassell Done  . Total abdominal hysterectomy w/ bilateral salpingoophorectomy      For Fibroids & endometriosis  . Cholecystectomy      for stones  . Shoulder surgery      Left & Right  . Thoracentesis      for peripneumonic effusion  . Laminectomy  2010    T1-2; Dr Vertell Limber  . Cervical fusion      C5-6; Dr Vertell Limber   Family History  Problem Relation Age of Onset  . Hypertension Sister   .  Hyperthyroidism Sister     Gra[place]  . Heart attack Mother 92  . Heart failure Mother   . Stroke Paternal Grandmother     in 10s  . Lung cancer Father   . Cancer Father     lung  . Diabetes Neg Hx   . Hyperthyroidism Sister     Gra[place]   Social History  Substance Use Topics  . Smoking status: Never Smoker   . Smokeless tobacco: Never Used  . Alcohol Use: No   OB History    No data available     Review of Systems  Gastrointestinal: Positive for abdominal pain.  Genitourinary: Negative for dysuria.  All other systems reviewed and are negative.   Allergies  Azithromycin; Levaquin; Tetracycline; Sulfonamide derivatives; and Amlodipine besy-benazepril hcl  Home Medications   Prior to Admission medications   Medication Sig Start Date End Date Taking? Authorizing Provider  folic acid (FOLVITE) 1 MG tablet TAKE 2 TABLETS BY MOUTH ONCE DAILY 10/09/14  Yes Hendricks Limes, MD  furosemide (LASIX) 40 MG tablet Take 1 tablet (40 mg total) by mouth daily. 03/10/15  Yes [name], MD  methotrexate (RHEUMATREX) 2.5 MG tablet Take 20 mg by mouth every Wednesday.   Yes Historical Provider, MD  predniSONE (DELTASONE) 1  MG tablet Take 2 mg by mouth daily with breakfast. Take with prednisone 5 mg   Yes Historical Provider, MD  predniSONE (DELTASONE) 5 MG tablet Take 5 mg by mouth daily with breakfast. Take with 2 tablets of 1 mg prednisone   Yes Historical Provider, MD  pyridostigmine (MESTINON) 60 MG tablet Take 1 tablet (60 mg total) by mouth 4 (four) times daily. 11/25/14  Yes Donika Keith Rake, DO  spironolactone (ALDACTONE) 25 MG tablet Take 1 tablet (25 mg total) by mouth daily. 03/14/15  Yes Skeet Latch, MD  valsartan (DIOVAN) 320 MG tablet Take 1 tablet (320 mg total) by mouth daily. 03/10/15  Yes [name], MD  metroNIDAZOLE (FLAGYL) 500 MG tablet Take 1 tablet (500 mg total) by mouth 2 (two) times daily. 04/08/15   Shenekia Riess Lyn Yumi Insalaco, MD  sulfamethoxazole-trimethoprim (BACTRIM  DS,SEPTRA DS) 800-160 MG tablet Take 1 tablet by mouth 2 (two) times daily. 04/08/15 04/15/15  Zona Pedro Lyn Milledge Gerding, MD   BP 124/57 mmHg  Pulse 73  Temp(Src) 99 F (37.2 C) (Oral)  Resp 22  SpO2 95% Physical Exam  Constitutional: She is oriented to person, place, and time. She appears well-developed and well-nourished.  HENT:  Head: Normocephalic and atraumatic.  Eyes: Conjunctivae are normal. Right eye exhibits no discharge. Left eye exhibits no discharge.  Pulmonary/Chest: Effort normal. No respiratory distress.  Abdominal: There is tenderness (LLQ with light palpation).  Neurological: She is alert and oriented to person, place, and time. Coordination normal.  Skin: Skin is warm and dry. No rash noted. She is not diaphoretic. No erythema.  Psychiatric: She has a normal mood and affect.  Nursing note and vitals reviewed.   ED Course  Procedures  DIAGNOSTIC STUDIES: Oxygen Saturation is 98% on RA, normal by my interpretation.    COORDINATION OF CARE: 3:22 AM - Discussed lab results of UTI and kidney infection, as well as plans to r/o kidney stones with a CT scan. Will order keflex. Pt advised of plan for treatment and pt agrees.  Labs Review Labs Reviewed  COMPREHENSIVE METABOLIC PANEL - Abnormal; Notable for the following:    Glucose, Bld 109 (*)    Creatinine, Ser 1.07 (*)    Albumin 3.4 (*)    GFR calc non Af Amer 52 (*)    All other components within normal limits  CBC - Abnormal; Notable for the following:    WBC 13.8 (*)    Hemoglobin 10.9 (*)    HCT 35.5 (*)    Platelets 441 (*)    All other components within normal limits  URINALYSIS, ROUTINE W REFLEX MICROSCOPIC (NOT AT [facility]) - Abnormal; Notable for the following:    Specific Gravity, Urine >1.030 (*)    Glucose, UA 100 (*)    Hgb urine dipstick MODERATE (*)    Bilirubin Urine LARGE (*)    Ketones, ur 40 (*)    Protein, ur 100 (*)    Leukocytes, UA MODERATE (*)    All other components within normal limits   URINE MICROSCOPIC-ADD ON - Abnormal; Notable for the following:    Squamous Epithelial / LPF 6-30 (*)    Bacteria, UA FEW (*)    Casts HYALINE CASTS (*)    All other components within normal limits  LIPASE, BLOOD    Imaging Review Ct Abdomen Pelvis W Contrast  04/08/2015  CLINICAL DATA:  Acute onset of severe left lower quadrant abdominal tenderness. Initial encounter. EXAM: CT ABDOMEN AND PELVIS WITH CONTRAST TECHNIQUE: Multidetector  CT imaging of the abdomen and pelvis was performed using the standard protocol following bolus administration of intravenous contrast. CONTRAST:  144mL ISOVUE-300 IOPAMIDOL (ISOVUE-300) INJECTION 61% COMPARISON:  None. FINDINGS: The visualized lung bases are clear. The liver and spleen are unremarkable in appearance. The patient is status post cholecystectomy, with clips noted at the gallbladder fossa. The pancreas and adrenal glands are unremarkable. The kidneys are unremarkable in appearance. There is no evidence of hydronephrosis. No renal or ureteral stones are seen. No perinephric stranding is appreciated. No free fluid is identified. The small bowel is unremarkable in appearance. The stomach is within normal limits. No acute vascular abnormalities are seen. Mild calcification is noted along the abdominal aorta and its branches, including at the proximal renal arteries bilaterally. The patient is status post appendectomy. A small umbilical hernia is noted, containing only fat. Focal wall thickening is noted along the proximal sigmoid colon, with associated diffuse soft tissue inflammation and trace fluid. A few associated inflamed diverticula are noted. This is concerning for acute diverticulitis. There is no definite evidence of perforation or abscess formation at this time. Minimal soft tissue inflammation tracks about the left ovary. Scattered diverticulosis involves the descending and proximal sigmoid colon. The bladder is mildly distended and grossly unremarkable.  The patient is status post hysterectomy. No suspicious adnexal masses are seen. No inguinal lymphadenopathy is seen. No acute osseous abnormalities are identified. Multilevel vacuum phenomenon is noted along the lower thoracic and upper lumbar spine, with underlying facet disease. IMPRESSION: 1. Acute diverticulitis at the proximal sigmoid colon, with focal wall thickening, and diffuse soft tissue inflammation and trace fluid. No definite evidence of perforation or abscess formation at this time. Minimal soft tissue inflammation tracks about the left ovary. 2. Scattered diverticulosis involves the descending and proximal sigmoid colon. 3. Mild calcification along the abdominal aorta and its branches, including at the proximal renal arteries bilaterally. 4. Small umbilical hernia, containing only fat. 5. Mild degenerative change along the lower thoracic and upper lumbar spine. Electronically Signed   By: Garald Balding M.D.   On: 04/08/2015 04:44   I have personally reviewed and evaluated these images and lab results as part of my medical decision-making.   EKG Interpretation None      MDM   Final diagnoses:  Diverticulitis of intestine without perforation or abscess without bleeding    Patient is a 70 year old female with history of myasthenia gravis presenting with left lower quadrant pain. Patient has had a lot of health issues as of late including positive stress test for which she is getting cardiac catheterization later this week. Patient reports that she's had left lower quadrant pain and occasional diarrhea.  Patient had tenderness on exam so we got a CAT scan. Showed diverticulitis. Patient is a difficult choice for antibiotics because of a myasthenia gravis, she cannot be on fluorquinolones. Therefore we'll give Flagyl and Bactrim. Patient had "adverse reaction" listed with the Bactrim. However the adverse reaction was a seizure 12 hours after one dose at age 7 during a fever. It is likely  to be a febrile seizure not related Bactrim use. We will try Bactrim today given risk of exacerbating myasthenia is greater with fluroquinolones, than the chance of this being a true reaction to Bactrim..   Return precuations expressed. Patietn taking PO with normal vitals at time of discharge.   I personally performed the services described in this documentation, which was scribed in my presence. The recorded information has been reviewed and is  accurate.     Mauro Arps Julio Alm, MD 04/08/15 [id number]  Macarthur Critchley, MD 04/08/15 [id number]

## 2015-04-11 ENCOUNTER — Ambulatory Visit (INDEPENDENT_AMBULATORY_CARE_PROVIDER_SITE_OTHER): Payer: Medicare Other | Admitting: Cardiovascular Disease

## 2015-04-11 ENCOUNTER — Encounter: Payer: Self-pay | Admitting: Cardiovascular Disease

## 2015-04-11 ENCOUNTER — Other Ambulatory Visit: Payer: Self-pay | Admitting: *Deleted

## 2015-04-11 ENCOUNTER — Encounter: Payer: Self-pay | Admitting: Cardiology

## 2015-04-11 VITALS — BP 100/66 | HR 88 | Ht 62.5 in | Wt 259.0 lb

## 2015-04-11 DIAGNOSIS — I2584 Coronary atherosclerosis due to calcified coronary lesion: Secondary | ICD-10-CM

## 2015-04-11 DIAGNOSIS — I10 Essential (primary) hypertension: Secondary | ICD-10-CM | POA: Diagnosis not present

## 2015-04-11 DIAGNOSIS — Z01818 Encounter for other preprocedural examination: Secondary | ICD-10-CM

## 2015-04-11 DIAGNOSIS — R002 Palpitations: Secondary | ICD-10-CM

## 2015-04-11 DIAGNOSIS — R931 Abnormal findings on diagnostic imaging of heart and coronary circulation: Secondary | ICD-10-CM

## 2015-04-11 DIAGNOSIS — R079 Chest pain, unspecified: Secondary | ICD-10-CM | POA: Diagnosis not present

## 2015-04-11 DIAGNOSIS — R9439 Abnormal result of other cardiovascular function study: Secondary | ICD-10-CM

## 2015-04-11 DIAGNOSIS — I2510 Atherosclerotic heart disease of native coronary artery without angina pectoris: Secondary | ICD-10-CM | POA: Diagnosis not present

## 2015-04-11 NOTE — Progress Notes (Signed)
Cardiology Office Note   Date:  04/11/2015   ID:  Kimberly Kimberly [name], DOB [birth year], MRN [id number]  PCP:  [name], MD  Cardiologist:   Sharol Harness, MD   Chief Complaint  Kimberly [name] presents with  . Follow-up    abnormal nuc  pt states she went to ED on monday--diverticulitis; pain/cramping in legs--has been better lately.      Kimberly [name] ID: Kimberly Kimberly [name] is a 70 y.o. female with hypertension, coronary artery calcification, myasthenia gravis, recurrent R pleural effusion, hyperlipidemia who presents for evaluation of asymptomatic coronary calcifications.    Interval History 04/11/15:  After her last appointment Kimberly Kimberly [name] was started on spironolactone for hypertension.  She had a Lexiscan Cardiolite that was concerning for inferior and apical ischemia.  She denies chest pain or shortness of breath.  She remains unable to do much exercise due to Myasthenia and arthritis.  She was seen in [facility] ED for abdominal pain on 3/28 and was noted to have UTI and diverticulitis.  Kimberly severe pain has improved but she continues to have persistent abdominal discomfort. She denies melena or hematochezia.  She denies lower extremity edema, orthopnea or PND.  History of Present Illness 03/14/15:Kimberly Kimberly [name] saw Dr. Chase Caller or routine follow-up on 02/28/15.  She had a chest CT for follow-up on her pleural effusion.  She was noted to have coronary calcification.  Kimberly Kimberly [name] denies any chest pain or shortness of breath.  She has chronic lower extremity edema due to vasculitis.  She likes to walk for exercise but is limited by her myasthenia.  She denies orthopnea or PND.  She has noted 3 episodes of tachycardia that awakened her from sleeping.  This started three months ago and Kimberly last episode was three weeks ago.  She awakens from sleep and feels like her heart is racing irregularly.  She checked her Fitbit and it was 162 bpm.  Kimberly episodes last for hours and Kimberly most recent one lasted all  night.  Kimberly symptoms get better when she gets up and walks around.  There is no associated chest pain, lightheadedness or shortness of breath.  This has never happened in Kimberly daytime or with exertion.  Kimberly Kimberly [name] checks her blood pressure at work and it typically runs in Kimberly 140-160s/70-90s.     Past Medical History  Diagnosis Date  . Myasthenia gravis without exacerbation (HCC)     [facility], Dr Anna Genre  . Diplopia   . Family history of ischemic heart disease   . Unspecified vitamin D deficiency     Dr Estanislado Pandy  . Other and unspecified hyperlipidemia   . Vasculitis ([place])     Dr Estanislado Pandy  . Myalgia and myositis, unspecified   . GERD (gastroesophageal reflux disease)   . Psoriasis     plantar ;Dr Jarome Matin  . Arthritis   . Heart murmur     Past Surgical History  Procedure Laterality Date  . Appendectomy      & exploratory for inflammation; Dr Hassell Done  . Total abdominal hysterectomy w/ bilateral salpingoophorectomy      For Fibroids & endometriosis  . Cholecystectomy      for stones  . Shoulder surgery      Left & Right  . Thoracentesis      for peripneumonic effusion  . Laminectomy  2010    T1-2; Dr Vertell Limber  . Cervical fusion      C5-6; Dr Vertell Limber     Current Outpatient Prescriptions  Medication Sig Dispense Refill  . folic acid (FOLVITE) 1 MG tablet TAKE 2 TABLETS BY MOUTH ONCE DAILY 180 tablet 3  . furosemide (LASIX) 40 MG tablet Take 1 tablet (40 mg total) by mouth daily. 30 tablet 1  . methotrexate (RHEUMATREX) 2.5 MG tablet Take 20 mg by mouth every Wednesday.    . metroNIDAZOLE (FLAGYL) 500 MG tablet Take 1 tablet (500 mg total) by mouth 2 (two) times daily. 14 tablet 0  . predniSONE (DELTASONE) 1 MG tablet Take 2 mg by mouth daily with breakfast. Take with prednisone 5 mg    . predniSONE (DELTASONE) 5 MG tablet Take 5 mg by mouth daily with breakfast. Take with 2 tablets of 1 mg prednisone    . pyridostigmine (MESTINON) 60 MG tablet Take 1 tablet (60 mg total) by  mouth 4 (four) times daily. 370 tablet 3  . spironolactone (ALDACTONE) 25 MG tablet Take 1 tablet (25 mg total) by mouth daily. 30 tablet 11  . sulfamethoxazole-trimethoprim (BACTRIM DS,SEPTRA DS) 800-160 MG tablet Take 1 tablet by mouth 2 (two) times daily. 14 tablet 0  . valsartan (DIOVAN) 320 MG tablet Take 1 tablet (320 mg total) by mouth daily. 30 tablet 1   No current facility-administered medications for this visit.    Allergies:   Azithromycin; Levaquin; Tetracycline; Sulfonamide derivatives; and Amlodipine besy-benazepril hcl    Social History:  Kimberly Kimberly [name]  reports that she has never smoked. She has never used smokeless tobacco. She reports that she does not drink alcohol or use illicit drugs.   Family History:  Kimberly Kimberly [name]'s family history includes Cancer in her father; Heart attack (age of onset: 55) in her mother; Heart failure in her mother; Hypertension in her sister; Hyperthyroidism in her sister and sister; Lung cancer in her father; Stroke in her paternal grandmother. There is no history of Diabetes.   ROS:  Please see Kimberly history of present illness.   Otherwise, review of systems are positive for none.   All other systems are reviewed and negative.   PHYSICAL EXAM: VS:  BP 100/66 mmHg  Pulse 88  Ht 5' 2.5" (1.588 m)  Wt 117.482 kg (259 lb)  BMI 46.59 kg/m2 , BMI Body mass index is 46.59 kg/(m^2). GENERAL:  Well appearing.  No acute distress. HEENT:  Pupils equal round and reactive, fundi not visualized, oral mucosa unremarkable NECK:  No jugular venous distention, waveform within normal limits, carotid upstroke brisk and symmetric, no bruits LYMPHATICS:  No cervical adenopathy LUNGS:  Clear to auscultation bilaterally HEART:  RRR.  PMI not displaced or sustained,S1 and S2 within normal limits, no S3, no S4, no clicks, no rubs, no murmurs ABD:  Flat, positive bowel sounds normal in frequency in pitch, no bruits, no rebound, no guarding, no midline pulsatile mass, no  hepatomegaly, no splenomegaly EXT:  2 plus pulses throughout, trace edema, no cyanosis no clubbing SKIN:  Bilateral lower extremity erythema NEURO:  Cranial nerves II through XII grossly intact, motor grossly intact throughout PSYCH:  Cognitively intact, oriented to person place and time  EKG:  EKG is not ordered today.  Lexiscan Cardiolite 03/28/15:  Nuclear stress EF: 71%.  Kimberly left ventricular ejection fraction is hyperdynamic (>65%).  There was no ST segment deviation noted during stress.  This is an intermediate risk study.  Findings consistent with ischemia.  Technically difficult study due to increased subdiaphragmatic activity; intermediate risk with small, severe, reversible defects in Kimberly apical and inferior basal walls consistent with ischemia; EF  71 with normal wall motion.   Recent Labs: 08/09/2014: TSH 2.20 02/17/2015: Magnesium 1.7 04/07/2015: ALT 18; BUN 17; Creatinine, Ser 1.07*; Hemoglobin 10.9*; Platelets 441*; Potassium 3.5; Sodium 140    Lipid Panel    Component Value Date/Time   CHOL 256* 08/09/2014 0928   TRIG 180* 08/09/2014 0928   HDL 74 08/09/2014 0928   LDLCALC 146* 08/09/2014 0928      Wt Readings from Last 3 Encounters:  04/11/15 117.482 kg (259 lb)  03/27/15 119.296 kg (263 lb)  03/14/15 119.466 kg (263 lb 6 oz)      ASSESSMENT AND PLAN:  # Coronary calcification: # Abnormal stress: A Lexiscan Cardiolite was obtained due to coronary calcifications noted on CT.  Kim[facility] Cardiolite was concerning for ischemia in Kimberly apical and inferior basal walls.  She denies chest pain or shortness of breath, but she is very inactive.  Therefore, we will refer her for coronary angiography to be better determine Kimberly severity of her coronary disease.  Start aspirin 81 mg daily.  Continue valsartan.  Avoid statins or nodal agents due to her history of myasthenia.  # Hypertension: BP is well-controlled.  Continue valsartan and spironolactone.  We will checka   BMP to follow her potassium level.  # Palpitations: 30 day event monitor is ongoing.    Current medicines are reviewed at length with Kimberly Kimberly [name] today.  Kimberly Kimberly [name] does not have concerns regarding medicines.  Kimberly following changes have been made:  None  Labs/ tests ordered today include:   No orders of Kimberly defined types were placed in this encounter.    Disposition:   FU with Kimberly Lowdermilk C. Oval Linsey, MD, [facility] in 1 month    This note was written with Kimberly assistance of speech recognition software.  Please excuse any transcriptional errors.  Signed, Lonnie Reth C. Oval Linsey, MD, [facility]  04/11/2015 3:10 PM    [facility]

## 2015-04-11 NOTE — Patient Instructions (Addendum)
Medication Instructions:  START ASPIRIN 81 MG DAILY   Labwork: Bmet/cbc/ptt/pt/inr at [facility] labs on the first floor  Testing/Procedures: Your physician has requested that you have a cardiac catheterization. Cardiac catheterization is used to diagnose and/or treat various heart conditions. Doctors may recommend this procedure for a number of different reasons. The most common reason is to evaluate chest pain. Chest pain can be a symptom of coronary artery disease (CAD), and cardiac catheterization can show whether plaque is narrowing or blocking your heart's arteries. This procedure is also used to evaluate the valves, as well as measure the blood flow and oxygen levels in different parts of your heart. For further information please visit [email or web address]. Please follow instruction sheet, as given.  Follow-Up: Your physician recommends that you schedule a follow-up appointment in: 1 month ov  If you need a refill on your cardiac medications before your next appointment, please call your pharmacy.

## 2015-04-12 LAB — PROTIME-INR
INR: 1.08 (ref <1.50)
PROTHROMBIN TIME: 14.1 s (ref 11.6–15.2)

## 2015-04-12 LAB — CBC WITH DIFFERENTIAL/PLATELET
BASOS ABS: 0 10*3/uL (ref 0.0–0.1)
Basophils Relative: 0 % (ref 0–1)
EOS PCT: 0 % (ref 0–5)
Eosinophils Absolute: 0 10*3/uL (ref 0.0–0.7)
HEMATOCRIT: 34.4 % — AB (ref 36.0–46.0)
Hemoglobin: 11 g/dL — ABNORMAL LOW (ref 12.0–15.0)
LYMPHS ABS: 0.9 10*3/uL (ref 0.7–4.0)
LYMPHS PCT: 6 % — AB (ref 12–46)
MCH: 26.8 pg (ref 26.0–34.0)
MCHC: 32 g/dL (ref 30.0–36.0)
MCV: 83.7 fL (ref 78.0–100.0)
MPV: 9 fL (ref 8.6–12.4)
Monocytes Absolute: 0.7 10*3/uL (ref 0.1–1.0)
Monocytes Relative: 5 % (ref 3–12)
NEUTROS PCT: 89 % — AB (ref 43–77)
Neutro Abs: 12.6 10*3/uL — ABNORMAL HIGH (ref 1.7–7.7)
PLATELETS: 497 10*3/uL — AB (ref 150–400)
RBC: 4.11 MIL/uL (ref 3.87–5.11)
RDW: 15.9 % — AB (ref 11.5–15.5)
WBC: 14.2 10*3/uL — AB (ref 4.0–10.5)

## 2015-04-12 LAB — BASIC METABOLIC PANEL
BUN: 19 mg/dL (ref 7–25)
CHLORIDE: 104 mmol/L (ref 98–110)
CO2: 25 mmol/L (ref 20–31)
Calcium: 9.1 mg/dL (ref 8.6–10.4)
Creat: 1.17 mg/dL — ABNORMAL HIGH (ref 0.50–0.99)
Glucose, Bld: 107 mg/dL — ABNORMAL HIGH (ref 65–99)
POTASSIUM: 4.1 mmol/L (ref 3.5–5.3)
Sodium: 139 mmol/L (ref 135–146)

## 2015-04-12 LAB — APTT: aPTT: 31 seconds (ref 24–37)

## 2015-04-14 ENCOUNTER — Ambulatory Visit: Payer: Medicare Other | Admitting: Cardiovascular Disease

## 2015-04-14 ENCOUNTER — Encounter: Payer: Self-pay | Admitting: Cardiovascular Disease

## 2015-04-14 DIAGNOSIS — R9439 Abnormal result of other cardiovascular function study: Secondary | ICD-10-CM | POA: Diagnosis present

## 2015-04-17 ENCOUNTER — Ambulatory Visit (HOSPITAL_COMMUNITY)
Admission: RE | Admit: 2015-04-17 | Discharge: 2015-04-17 | Disposition: A | Discharge status: Home / Self Care | Payer: Medicare Other | Source: Ambulatory Visit | Attending: Cardiology | Admitting: Cardiology

## 2015-04-17 ENCOUNTER — Encounter (HOSPITAL_COMMUNITY)
Admission: RE | Disposition: A | Discharge status: Home / Self Care | Payer: Self-pay | Source: Ambulatory Visit | Attending: Cardiology

## 2015-04-17 ENCOUNTER — Encounter (HOSPITAL_COMMUNITY): Payer: Self-pay | Admitting: Cardiology

## 2015-04-17 DIAGNOSIS — R931 Abnormal findings on diagnostic imaging of heart and coronary circulation: Secondary | ICD-10-CM | POA: Diagnosis not present

## 2015-04-17 DIAGNOSIS — I10 Essential (primary) hypertension: Secondary | ICD-10-CM | POA: Diagnosis not present

## 2015-04-17 DIAGNOSIS — R6884 Jaw pain: Secondary | ICD-10-CM | POA: Insufficient documentation

## 2015-04-17 DIAGNOSIS — I2510 Atherosclerotic heart disease of native coronary artery without angina pectoris: Secondary | ICD-10-CM | POA: Diagnosis present

## 2015-04-17 DIAGNOSIS — Z8249 Family history of ischemic heart disease and other diseases of the circulatory system: Secondary | ICD-10-CM | POA: Diagnosis not present

## 2015-04-17 DIAGNOSIS — E785 Hyperlipidemia, unspecified: Secondary | ICD-10-CM | POA: Diagnosis not present

## 2015-04-17 DIAGNOSIS — Z7982 Long term (current) use of aspirin: Secondary | ICD-10-CM | POA: Insufficient documentation

## 2015-04-17 DIAGNOSIS — Z01818 Encounter for other preprocedural examination: Secondary | ICD-10-CM

## 2015-04-17 DIAGNOSIS — G7000 Myasthenia gravis without (acute) exacerbation: Secondary | ICD-10-CM | POA: Diagnosis not present

## 2015-04-17 DIAGNOSIS — Z79899 Other long term (current) drug therapy: Secondary | ICD-10-CM | POA: Diagnosis not present

## 2015-04-17 DIAGNOSIS — Z7952 Long term (current) use of systemic steroids: Secondary | ICD-10-CM | POA: Insufficient documentation

## 2015-04-17 DIAGNOSIS — R9439 Abnormal result of other cardiovascular function study: Secondary | ICD-10-CM | POA: Diagnosis present

## 2015-04-17 HISTORY — PX: CARDIAC CATHETERIZATION: SHX172

## 2015-04-17 SURGERY — LEFT HEART CATH AND CORONARY ANGIOGRAPHY
Anesthesia: LOCAL

## 2015-04-17 MED ORDER — ACETAMINOPHEN 325 MG PO TABS: 650.0000 mg | ORAL_TABLET | ORAL | Status: DC | PRN

## 2015-04-17 MED ORDER — SODIUM CHLORIDE 0.9 % IV SOLN
INTRAVENOUS | Status: DC
  Administered 2015-04-17: 08:00:00 via INTRAVENOUS

## 2015-04-17 MED ORDER — LIDOCAINE HCL (PF) 1 % IJ SOLN
INTRAMUSCULAR | Status: DC | PRN
  Administered 2015-04-17: 2 mL

## 2015-04-17 MED ORDER — VERAPAMIL HCL 2.5 MG/ML IV SOLN
INTRAVENOUS | Status: AC
  Filled 2015-04-17: qty 2

## 2015-04-17 MED ORDER — LIDOCAINE HCL (PF) 1 % IJ SOLN
INTRAMUSCULAR | Status: AC
  Filled 2015-04-17: qty 30

## 2015-04-17 MED ORDER — DIAZEPAM 5 MG PO TABS
ORAL_TABLET | ORAL | Status: AC
  Administered 2015-04-17: 5 mg via ORAL
  Filled 2015-04-17: qty 1

## 2015-04-17 MED ORDER — DIAZEPAM 5 MG PO TABS
5.0000 mg | ORAL_TABLET | ORAL | Status: AC
  Administered 2015-04-17: 5 mg via ORAL

## 2015-04-17 MED ORDER — SODIUM CHLORIDE 0.9% FLUSH: 3.0000 mL | Freq: Two times a day (BID) | INTRAVENOUS | Status: DC

## 2015-04-17 MED ORDER — ONDANSETRON HCL 4 MG/2ML IJ SOLN: 4.0000 mg | Freq: Four times a day (QID) | INTRAMUSCULAR | Status: DC | PRN

## 2015-04-17 MED ORDER — NITROGLYCERIN 1 MG/10 ML FOR IR/CATH LAB
INTRA_ARTERIAL | Status: AC
  Filled 2015-04-17: qty 10

## 2015-04-17 MED ORDER — HEPARIN SODIUM (PORCINE) 1000 UNIT/ML IJ SOLN
INTRAMUSCULAR | Status: DC | PRN
  Administered 2015-04-17: 5000 [IU] via INTRAVENOUS

## 2015-04-17 MED ORDER — IOPAMIDOL (ISOVUE-370) INJECTION 76%
INTRAVENOUS | Status: DC | PRN
  Administered 2015-04-17: 60 mL via INTRAVENOUS

## 2015-04-17 MED ORDER — FENTANYL CITRATE (PF) 100 MCG/2ML IJ SOLN
INTRAMUSCULAR | Status: AC
  Filled 2015-04-17: qty 2

## 2015-04-17 MED ORDER — HEPARIN (PORCINE) IN NACL 2-0.9 UNIT/ML-% IJ SOLN
INTRAMUSCULAR | Status: DC | PRN
  Administered 2015-04-17: 1000 mL

## 2015-04-17 MED ORDER — SODIUM CHLORIDE 0.9% FLUSH: 3.0000 mL | INTRAVENOUS | Status: DC | PRN

## 2015-04-17 MED ORDER — HEPARIN SODIUM (PORCINE) 1000 UNIT/ML IJ SOLN
INTRAMUSCULAR | Status: AC
  Filled 2015-04-17: qty 1

## 2015-04-17 MED ORDER — SODIUM CHLORIDE 0.9 % WEIGHT BASED INFUSION: 3.0000 mL/kg/h | INTRAVENOUS | Status: DC

## 2015-04-17 MED ORDER — SODIUM CHLORIDE 0.9 % IV SOLN: 250.0000 mL | INTRAVENOUS | Status: DC | PRN

## 2015-04-17 MED ORDER — ASPIRIN 81 MG PO CHEW: 81.0000 mg | CHEWABLE_TABLET | ORAL | Status: DC

## 2015-04-17 MED ORDER — FENTANYL CITRATE (PF) 100 MCG/2ML IJ SOLN
INTRAMUSCULAR | Status: DC | PRN
  Administered 2015-04-17: 25 ug via INTRAVENOUS

## 2015-04-17 MED ORDER — HEPARIN (PORCINE) IN NACL 2-0.9 UNIT/ML-% IJ SOLN
INTRAMUSCULAR | Status: AC
  Filled 2015-04-17: qty 1000

## 2015-04-17 MED ORDER — HEPARIN (PORCINE) IN NACL 2-0.9 UNIT/ML-% IJ SOLN
INTRAMUSCULAR | Status: DC | PRN
  Administered 2015-04-17: 10 mL via INTRA_ARTERIAL

## 2015-04-17 MED ORDER — IOPAMIDOL (ISOVUE-370) INJECTION 76%
INTRAVENOUS | Status: AC
  Filled 2015-04-17: qty 100

## 2015-04-17 SURGICAL SUPPLY — 8 items
CATH INFINITI 5 FR JL3.5 (CATHETERS) ×2 IMPLANT
CATH INFINITI JR4 5F (CATHETERS) ×2 IMPLANT
GLIDESHEATH SLEND A-KIT 6F 22G (SHEATH) ×2 IMPLANT
KIT HEART LEFT (KITS) ×2 IMPLANT
PACK CARDIAC CATHETERIZATION (CUSTOM PROCEDURE TRAY) ×2 IMPLANT
TRANSDUCER W/STOPCOCK (MISCELLANEOUS) ×2 IMPLANT
TUBING CIL FLEX 10 FLL-RA (TUBING) ×2 IMPLANT
WIRE SAFE-T 1.5MM-J .035X260CM (WIRE) ×2 IMPLANT

## 2015-04-17 NOTE — Discharge Instructions (Signed)
Radial Site Care °Refer to this sheet in the next few weeks. These instructions provide you with information about caring for yourself after your procedure. Your health care provider may also give you more specific instructions. Your treatment has been planned according to current medical practices, but problems sometimes occur. Call your health care provider if you have any problems or questions after your procedure. °WHAT TO EXPECT AFTER THE PROCEDURE °After your procedure, it is typical to have the following: °· Bruising at the radial site that usually fades within 1-2 weeks. °· Blood collecting in the tissue (hematoma) that may be painful to the touch. It should usually decrease in size and tenderness within 1-2 weeks. °HOME CARE INSTRUCTIONS °· Take medicines only as directed by your health care provider. °· You may shower 24-48 hours after the procedure or as directed by your health care provider. Remove the bandage (dressing) and gently wash the site with plain soap and water. Pat the area dry with a clean towel. Do not rub the site, because this may cause bleeding. °· Do not take baths, swim, or use a hot tub until your health care provider approves. °· Check your insertion site every day for redness, swelling, or drainage. °· Do not apply powder or lotion to the site. °· Do not flex or bend the affected arm for 24 hours or as directed by your health care provider. °· Do not push or pull heavy objects with the affected arm for 24 hours or as directed by your health care provider. °· Do not lift over 10 lb (4.5 kg) for 5 days after your procedure or as directed by your health care provider. °· Ask your health care provider when it is okay to: °¨ Return to work or school. °¨ Resume usual physical activities or sports. °¨ Resume sexual activity. °· Do not drive home if you are discharged the same day as the procedure. Have someone else drive you. °· You may drive 24 hours after the procedure unless otherwise  instructed by your health care provider. °· Do not operate machinery or power tools for 24 hours after the procedure. °· If your procedure was done as an outpatient procedure, which means that you went home the same day as your procedure, a responsible adult should be with you for the first 24 hours after you arrive home. °· Keep all follow-up visits as directed by your health care provider. This is important. °SEEK MEDICAL CARE IF: °· You have a fever. °· You have chills. °· You have increased bleeding from the radial site. Hold pressure on the site and call 911. °SEEK IMMEDIATE MEDICAL CARE IF: °· You have unusual pain at the radial site. °· You have redness, warmth, or swelling at the radial site. °· You have drainage (other than a small amount of blood on the dressing) from the radial site. °· The radial site is bleeding, and the bleeding does not stop after 30 minutes of holding steady pressure on the site. °· Your arm or hand becomes pale, cool, tingly, or numb. °  °This information is not intended to replace advice given to you by your health care provider. Make sure you discuss any questions you have with your health care provider. °  °Document Released: 01/30/2010 Document Revised: 01/18/2014 Document Reviewed: 07/16/2013 °Elsevier Interactive Patient Education ©2016 [employer] ° °

## 2015-04-17 NOTE — H&P (View-Only) (Signed)
Cardiology Office Note   Date:  04/11/2015   ID:  MADDOX BELLEAU, DOB September 14, 1945, MRN [id number]  PCP:  [name], MD  Cardiologist:   Sharol Harness, MD   Chief Complaint  Patient presents with  . Follow-up    abnormal nuc  pt states she went to ED on monday--diverticulitis; pain/cramping in legs--has been better lately.      Patient ID: ALANA BOZZELLI is a 70 y.o. female with hypertension, coronary artery calcification, myasthenia gravis, recurrent R pleural effusion, hyperlipidemia who presents for evaluation of asymptomatic coronary calcifications.    Interval History 04/11/15:  After her last appointment Ms. Biggins was started on spironolactone for hypertension.  She had a Lexiscan Cardiolite that was concerning for inferior and apical ischemia.  She denies chest pain or shortness of breath.  She remains unable to do much exercise due to Myasthenia and arthritis.  She was seen in the ED for abdominal pain on 3/28 and was noted to have UTI and diverticulitis.  The severe pain has improved but she continues to have persistent abdominal discomfort. She denies melena or hematochezia.  She denies lower extremity edema, orthopnea or PND.  History of Present Illness 03/14/15:Ms. Hupp saw Dr. Chase Caller or routine follow-up on 02/28/15.  She had a chest CT for follow-up on her pleural effusion.  She was noted to have coronary calcification.  Ms. Leinonen denies any chest pain or shortness of breath.  She has chronic lower extremity edema due to vasculitis.  She likes to walk for exercise but is limited by her myasthenia.  She denies orthopnea or PND.  She has noted 3 episodes of tachycardia that awakened her from sleeping.  This started three months ago and the last episode was three weeks ago.  She awakens from sleep and feels like her heart is racing irregularly.  She checked her Fitbit and it was 162 bpm.  The episodes last for hours and the most recent one lasted all  night.  The symptoms get better when she gets up and walks around.  There is no associated chest pain, lightheadedness or shortness of breath.  This has never happened in the daytime or with exertion.  Ms. Labean checks her blood pressure at work and it typically runs in the 140-160s/70-90s.     Past Medical History  Diagnosis Date  . Myasthenia gravis without exacerbation (HCC)     [facility], Dr Anna Genre  . Diplopia   . Family history of ischemic heart disease   . Unspecified vitamin D deficiency     Dr Estanislado Pandy  . Other and unspecified hyperlipidemia   . Vasculitis ([place])     Dr Estanislado Pandy  . Myalgia and myositis, unspecified   . GERD (gastroesophageal reflux disease)   . Psoriasis     plantar ;Dr Jarome Matin  . Arthritis   . Heart murmur     Past Surgical History  Procedure Laterality Date  . Appendectomy      & exploratory for inflammation; Dr Hassell Done  . Total abdominal hysterectomy w/ bilateral salpingoophorectomy      For Fibroids & endometriosis  . Cholecystectomy      for stones  . Shoulder surgery      Left & Right  . Thoracentesis      for peripneumonic effusion  . Laminectomy  2010    T1-2; Dr Vertell Limber  . Cervical fusion      C5-6; Dr Vertell Limber     Current Outpatient Prescriptions  Medication Sig Dispense Refill  . folic acid (FOLVITE) 1 MG tablet TAKE 2 TABLETS BY MOUTH ONCE DAILY 180 tablet 3  . furosemide (LASIX) 40 MG tablet Take 1 tablet (40 mg total) by mouth daily. 30 tablet 1  . methotrexate (RHEUMATREX) 2.5 MG tablet Take 20 mg by mouth every Wednesday.    . metroNIDAZOLE (FLAGYL) 500 MG tablet Take 1 tablet (500 mg total) by mouth 2 (two) times daily. 14 tablet 0  . predniSONE (DELTASONE) 1 MG tablet Take 2 mg by mouth daily with breakfast. Take with prednisone 5 mg    . predniSONE (DELTASONE) 5 MG tablet Take 5 mg by mouth daily with breakfast. Take with 2 tablets of 1 mg prednisone    . pyridostigmine (MESTINON) 60 MG tablet Take 1 tablet (60 mg total) by  mouth 4 (four) times daily. 370 tablet 3  . spironolactone (ALDACTONE) 25 MG tablet Take 1 tablet (25 mg total) by mouth daily. 30 tablet 11  . sulfamethoxazole-trimethoprim (BACTRIM DS,SEPTRA DS) 800-160 MG tablet Take 1 tablet by mouth 2 (two) times daily. 14 tablet 0  . valsartan (DIOVAN) 320 MG tablet Take 1 tablet (320 mg total) by mouth daily. 30 tablet 1   No current facility-administered medications for this visit.    Allergies:   Azithromycin; Levaquin; Tetracycline; Sulfonamide derivatives; and Amlodipine besy-benazepril hcl    Social History:  The patient  reports that she has never smoked. She has never used smokeless tobacco. She reports that she does not drink alcohol or use illicit drugs.   Family History:  The patient's family history includes Cancer in her father; Heart attack (age of onset: 43) in her mother; Heart failure in her mother; Hypertension in her sister; Hyperthyroidism in her sister and sister; Lung cancer in her father; Stroke in her paternal grandmother. There is no history of Diabetes.   ROS:  Please see the history of present illness.   Otherwise, review of systems are positive for none.   All other systems are reviewed and negative.   PHYSICAL EXAM: VS:  BP 100/66 mmHg  Pulse 88  Ht 5' 2.5" (1.588 m)  Wt 117.482 kg (259 lb)  BMI 46.59 kg/m2 , BMI Body mass index is 46.59 kg/(m^2). GENERAL:  Well appearing.  No acute distress. HEENT:  Pupils equal round and reactive, fundi not visualized, oral mucosa unremarkable NECK:  No jugular venous distention, waveform within normal limits, carotid upstroke brisk and symmetric, no bruits LYMPHATICS:  No cervical adenopathy LUNGS:  Clear to auscultation bilaterally HEART:  RRR.  PMI not displaced or sustained,S1 and S2 within normal limits, no S3, no S4, no clicks, no rubs, no murmurs ABD:  Flat, positive bowel sounds normal in frequency in pitch, no bruits, no rebound, no guarding, no midline pulsatile mass, no  hepatomegaly, no splenomegaly EXT:  2 plus pulses throughout, trace edema, no cyanosis no clubbing SKIN:  Bilateral lower extremity erythema NEURO:  Cranial nerves II through XII grossly intact, motor grossly intact throughout PSYCH:  Cognitively intact, oriented to person place and time  EKG:  EKG is not ordered today.  Lexiscan Cardiolite 03/28/15:  Nuclear stress EF: 71%.  The left ventricular ejection fraction is hyperdynamic (>65%).  There was no ST segment deviation noted during stress.  This is an intermediate risk study.  Findings consistent with ischemia.  Technically difficult study due to increased subdiaphragmatic activity; intermediate risk with small, severe, reversible defects in the apical and inferior basal walls consistent with ischemia; EF  71 with normal wall motion.   Recent Labs: 08/09/2014: TSH 2.20 02/17/2015: Magnesium 1.7 04/07/2015: ALT 18; BUN 17; Creatinine, Ser 1.07*; Hemoglobin 10.9*; Platelets 441*; Potassium 3.5; Sodium 140    Lipid Panel    Component Value Date/Time   CHOL 256* 08/09/2014 0928   TRIG 180* 08/09/2014 0928   HDL 74 08/09/2014 0928   LDLCALC 146* 08/09/2014 0928      Wt Readings from Last 3 Encounters:  04/11/15 117.482 kg (259 lb)  03/27/15 119.296 kg (263 lb)  03/14/15 119.466 kg (263 lb 6 oz)      ASSESSMENT AND PLAN:  # Coronary calcification: # Abnormal stress: A Lexiscan Cardiolite was obtained due to coronary calcifications noted on CT.  The Cardiolite was concerning for ischemia in the apical and inferior basal walls.  She denies chest pain or shortness of breath, but she is very inactive.  Therefore, we will refer her for coronary angiography to be better determine the severity of her coronary disease.  Start aspirin 81 mg daily.  Continue valsartan.  Avoid statins or nodal agents due to her history of myasthenia.  # Hypertension: BP is well-controlled.  Continue valsartan and spironolactone.  We will checka   BMP to follow her potassium level.  # Palpitations: 30 day event monitor is ongoing.    Current medicines are reviewed at length with the patient today.  The patient does not have concerns regarding medicines.  The following changes have been made:  None  Labs/ tests ordered today include:   No orders of the defined types were placed in this encounter.    Disposition:   FU with Kanae Ignatowski C. Oval Linsey, MD, [facility] in 1 month    This note was written with the assistance of speech recognition software.  Please excuse any transcriptional errors.  Signed, Dorothie Wah C. Oval Linsey, MD, [facility]  04/11/2015 3:10 PM    [facility]

## 2015-04-17 NOTE — Interval H&P Note (Signed)
History and Physical Interval Note:  04/17/2015 10:11 AM  Kimberly [name]  has presented today for surgery, with the diagnosis of chest pain and abnormal nuclear stress test with coronary calcification on CT scan.  The various methods of treatment have been discussed with the patient and family. After consideration of risks, benefits and other options for treatment, the patient has consented to  Procedure(s): Left Heart Cath and Coronary Angiography (N/A) as a surgical intervention .  The patient's history has been reviewed, patient examined, no change in status, stable for surgery.  I have reviewed the patient's chart and labs.  Questions were answered to the patient's satisfaction.    Cath Lab Visit (complete for each Cath Lab visit)  Clinical Evaluation Leading to the Procedure:   ACS: No.  Non-ACS:    Anginal Classification: No Symptoms  Anti-ischemic medical therapy: No Therapy  Non-Invasive Test Results: Intermediate-risk stress test findings: cardiac mortality 1-3%/year  Prior CABG: No previous CABG  Ischemic Symptoms? Asymptomatic (No ischemic symptoms) Anti-ischemic Medical Therapy? No Therapy Non-invasive Test Results? Intermediate-risk stress test findings: cardiac mortality 1-3%/year Prior CABG? No Previous CABG   Patient Information:   1-2V CAD, no prox LAD  I (3)  Indication: 16; Score: 3   Patient Information:   CTO of 1 vessel, no other CAD  I (3)  Indication: 26; Score: 3   Patient Information:   1V CAD with prox LAD  U (4)  Indication: 32; Score: 4   Patient Information:   2V-CAD with prox LAD  U (5)  Indication: 38; Score: 5   Patient Information:   3V-CAD without LMCA  A (7)  Indication: 44; Score: 7   Patient Information:   3V-CAD without LMCA With Abnormal LV systolic function  A (8)  Indication: 48; Score: 8   Patient Information:   LMCA-CAD  A (9)  Indication: 49; Score: 9   Patient Information:   2V-CAD with  prox LAD PCI  A (7)  Indication: 62; Score: 7   Patient Information:   2V-CAD with prox LAD CABG  A (8)  Indication: 62; Score: 8   Patient Information:   3V-CAD without LMCA With Low CAD burden(i.e., 3 focal stenoses, low SYNTAX score) PCI  A (7)  Indication: 63; Score: 7   Patient Information:   3V-CAD without LMCA With Low CAD burden(i.e., 3 focal stenoses, low SYNTAX score) CABG  A (9)  Indication: 63; Score: 9   Patient Information:   3V-CAD without LMCA E06c - Intermediate-high CAD burden (i.e., multiple diffuse lesions, presence of CTO, or high SYNTAX score) PCI  U (4)  Indication: 64; Score: 4   Patient Information:   3V-CAD without LMCA E06c - Intermediate-high CAD burden (i.e., multiple diffuse lesions, presence of CTO, or high SYNTAX score) CABG  A (9)  Indication: 64; Score: 9   Patient Information:   LMCA-CAD With Isolated LMCA stenosis  PCI  U (6)  Indication: 65; Score: 6   Patient Information:   LMCA-CAD With Isolated LMCA stenosis  CABG  A (9)  Indication: 65; Score: 9   Patient Information:   LMCA-CAD Additional CAD, low CAD burden (i.e., 1- to 2-vessel additional involvement, low SYNTAX score) PCI  U (5)  Indication: 66; Score: 5   Patient Information:   LMCA-CAD Additional CAD, low CAD burden (i.e., 1- to 2-vessel additional involvement, low SYNTAX score) CABG  A (9)  Indication: 66; Score: 9   Patient Information:   LMCA-CAD Additional CAD, intermediate-high  CAD burden (i.e., 3-vessel involvement, presence of CTO, or high SYNTAX score) PCI  I (3)  Indication: 67; Score: 3   Patient Information:   LMCA-CAD Additional CAD, intermediate-high CAD burden (i.e., 3-vessel involvement, presence of CTO, or high SYNTAX score) CABG  A (9)  Indication: 67; Score: 9    Kimberly [name] W

## 2015-04-18 MED FILL — Nitroglycerin IV Soln 100 MCG/ML in D5W: INTRA_ARTERIAL | Qty: 10 | Status: AC

## 2015-04-25 ENCOUNTER — Ambulatory Visit: Payer: Medicare Other | Admitting: Neurology

## 2015-05-09 ENCOUNTER — Encounter: Payer: Self-pay | Admitting: Internal Medicine

## 2015-05-09 ENCOUNTER — Ambulatory Visit (INDEPENDENT_AMBULATORY_CARE_PROVIDER_SITE_OTHER): Payer: Medicare Other | Admitting: Internal Medicine

## 2015-05-09 ENCOUNTER — Other Ambulatory Visit: Payer: Medicare Other

## 2015-05-09 VITALS — BP 132/78 | HR 70 | Temp 98.6°F | Resp 16 | Wt 259.0 lb

## 2015-05-09 DIAGNOSIS — Z1211 Encounter for screening for malignant neoplasm of colon: Secondary | ICD-10-CM

## 2015-05-09 DIAGNOSIS — N390 Urinary tract infection, site not specified: Secondary | ICD-10-CM | POA: Diagnosis not present

## 2015-05-09 DIAGNOSIS — I2510 Atherosclerotic heart disease of native coronary artery without angina pectoris: Secondary | ICD-10-CM

## 2015-05-09 DIAGNOSIS — Z23 Encounter for immunization: Secondary | ICD-10-CM

## 2015-05-09 DIAGNOSIS — L749 Eccrine sweat disorder, unspecified: Secondary | ICD-10-CM | POA: Diagnosis not present

## 2015-05-09 DIAGNOSIS — L750 Bromhidrosis: Secondary | ICD-10-CM

## 2015-05-09 DIAGNOSIS — G7000 Myasthenia gravis without (acute) exacerbation: Secondary | ICD-10-CM | POA: Diagnosis not present

## 2015-05-09 DIAGNOSIS — I10 Essential (primary) hypertension: Secondary | ICD-10-CM

## 2015-05-09 DIAGNOSIS — R8290 Unspecified abnormal findings in urine: Secondary | ICD-10-CM

## 2015-05-09 LAB — POCT URINALYSIS DIPSTICK
Bilirubin, UA: NEGATIVE
GLUCOSE UA: NEGATIVE
Ketones, UA: NEGATIVE
Leukocytes, UA: NEGATIVE
NITRITE UA: NEGATIVE
PH UA: 6
PROTEIN UA: NEGATIVE
RBC UA: NEGATIVE
Spec Grav, UA: 1.02
UROBILINOGEN UA: 0.2

## 2015-05-09 NOTE — Progress Notes (Signed)
Subjective:    Patient ID: Kimberly [name], female    DOB: Jul 07, 1945, 70 y.o.   MRN: [id number]  HPI She is here to establish with a new pcp.   Urine odor:  She has noticed an odor to her urine and her urine is more concentrated.  She is also having lower abdominal fullness.  She denies frequent urination.  She denies dysuria and hematuria.    Hypertension: She is taking her medication daily. She is compliant with a low sodium diet.  She has occasional palpitations and chronic edema in her legs.  She denies chest pain, shortness of breath and regular headaches. She is not exercising regularly.    Myasthenia gravis:  She is taking her medications daily.  She follows with neurology.  She has proximal muscle pain and weakness, which she is not sure if it is related to her Myasthenia.    Medications and allergies reviewed with patient and updated if appropriate.  Patient Active Problem List   Diagnosis Date Noted  . Abnormal nuclear stress test - INTERMEDIATE RISK 04/14/2015  . Coronary artery calcification seen on CAT scan 02/28/2015  . Fever, unspecified 02/17/2015  . Left lower quadrant pain 02/17/2015  . Myasthenia gravis without (acute) exacerbation ([place]) 11/25/2014  . Hyperglycemia 08/09/2014  . Community acquired pneumonia 03/29/2013  . Dyspnea 03/20/2013  . Hypokalemia 12/31/2012  . Umbilical hernia [id number]  . Pleural effusion 05/10/2012  . Myasthenia gravis without exacerbation ([place]) 02/12/2010  . DIPLOPIA 11/21/2009  . DISTURBANCE OF SKIN SENSATION 11/21/2009  . Vitamin D deficiency 06/25/2008  . Hyperlipidemia 06/25/2008  . Essential hypertension 06/25/2008  . Leukocytoclastic vasculitis ([place]) 06/19/2007  . MUSCLE WEAKNESS (GENERALIZED) 02/10/2007  . ELEVATED SEDIMENTATION RATE 02/10/2007  . Unspecified Myalgia and Myositis 01/24/2007  . EDEMA- LOCALIZED 01/24/2007  . ANA POSITIVE, HX OF 01/24/2007    Current Outpatient Prescriptions on File Prior to Visit    Medication Sig Dispense Refill  . aspirin 81 MG tablet Take 81 mg by mouth daily.    . folic acid (FOLVITE) 1 MG tablet TAKE 2 TABLETS BY MOUTH ONCE DAILY 180 tablet 3  . furosemide (LASIX) 40 MG tablet Take 1 tablet (40 mg total) by mouth daily. 30 tablet 1  . methotrexate (RHEUMATREX) 2.5 MG tablet Take 20 mg by mouth every Wednesday.    . predniSONE (DELTASONE) 1 MG tablet Take 2 mg by mouth daily with breakfast. Take with prednisone 5 mg    . predniSONE (DELTASONE) 5 MG tablet Take 5 mg by mouth daily with breakfast. Take with 2 tablets of 1 mg prednisone    . pyridostigmine (MESTINON) 60 MG tablet Take 1 tablet (60 mg total) by mouth 4 (four) times daily. 370 tablet 3  . spironolactone (ALDACTONE) 25 MG tablet Take 1 tablet (25 mg total) by mouth daily. 30 tablet 11  . valsartan (DIOVAN) 320 MG tablet Take 1 tablet (320 mg total) by mouth daily. 30 tablet 1   No current facility-administered medications on file prior to visit.    Past Medical History  Diagnosis Date  . Myasthenia gravis without exacerbation (HCC)     [facility], Dr Anna Genre  . Diplopia   . Family history of ischemic heart disease   . Unspecified vitamin D deficiency     Dr Estanislado Pandy  . Other and unspecified hyperlipidemia   . Vasculitis ([place])     Dr Estanislado Pandy  . Myalgia and myositis, unspecified   . GERD (gastroesophageal reflux disease)   .  Psoriasis     plantar ;Dr Jarome Matin  . Arthritis   . Heart murmur     Past Surgical History  Procedure Laterality Date  . Appendectomy      & exploratory for inflammation; Dr Hassell Done  . Total abdominal hysterectomy w/ bilateral salpingoophorectomy      For Fibroids & endometriosis  . Cholecystectomy      for stones  . Shoulder surgery      Left & Right  . Thoracentesis      for peripneumonic effusion  . Laminectomy  2010    T1-2; Dr Vertell Limber  . Cervical fusion      C5-6; Dr Vertell Limber  . Cardiac catheterization N/A 04/17/2015    Procedure: Left Heart Cath and Coronary  Angiography;  Surgeon: Leonie Man, MD;  Location: [facility];  Service: Cardiovascular;  Laterality: N/A;    Social History   Social History  . Marital Status: Married    Spouse Name: N/A  . Number of Children: N/A  . Years of Education: N/A   Occupational History  . 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 Smoking status: Never Smoker   . Smokeless tobacco: Never Used  . Alcohol Use: No  . Drug Use: No  . Sexual Activity: Not Asked   Other Topics Concern  . None   Social History Narrative   Lives with husband in a one story home.  Had one child that only lived for 14 hours.     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          Epworth Sleepiness Scale = 2 (as of 03/14/2015)    Family History  Problem Relation Age of Onset  . Hypertension Sister   . Hyperthyroidism Sister     [place]  . Heart attack Mother 42  . Heart failure Mother   . Stroke Paternal Grandmother     in 36s  . Lung cancer Father   . Cancer Father     lung  . Diabetes Neg Hx   . Hyperthyroidism Sister     Gra[place]    Review of Systems  Constitutional: Negative for fever and chills.  Respiratory: Negative for cough, shortness of breath and wheezing.   Cardiovascular: Positive for palpitations (episodes of tachycardia - occurs only at night) and leg swelling (R > L). Negative for chest pain.  Gastrointestinal: Negative for abdominal pain, diarrhea, constipation and blood in stool.  Genitourinary: Negative for dysuria and hematuria.  Musculoskeletal: Positive for myalgias (proximal muscle weakness) and arthralgias (knees, shoulders).  Neurological: Negative for light-headedness and headaches.       Objective:   Filed Vitals:   05/09/15 0822  BP: 132/78  Pulse: 70  Temp: 98.6 F (37 C)  Resp: 16   Filed Weights   05/09/15 0822  Weight: 259 lb (117.482 kg)   Body mass index is 46.59 kg/(m^2).   Physical  Exam Constitutional: Appears well-developed and well-nourished. No distress.  Neck: Neck supple. No tracheal deviation present. No thyromegaly present.  No carotid bruit. No cervical adenopathy.   Cardiovascular: Normal rate, regular rhythm and normal heart sounds.   2/6 systolic murmur  heard.  2+ edema right ankle, 1+ edema in left ankle edema Pulmonary/Chest: Effort normal and breath sounds normal. No respiratory distress. No wheezes.        Assessment & Plan:   See Problem List for  Assessment and Plan of chronic medical problems.

## 2015-05-09 NOTE — Progress Notes (Signed)
Pre visit review using our clinic review tool, if applicable. No additional management support is needed unless otherwise documented below in the visit note. 

## 2015-05-09 NOTE — Assessment & Plan Note (Addendum)
Cardiac cath 04/17/15: The left ventricular systolic function is normal. Angiographically normal coronary arteries, but very tortuous

## 2015-05-09 NOTE — Patient Instructions (Signed)
   Medications reviewed and updated.  No changes recommended at this time.   A referral for GI for a colonoscopy

## 2015-05-10 DIAGNOSIS — R8290 Unspecified abnormal findings in urine: Secondary | ICD-10-CM | POA: Insufficient documentation

## 2015-05-10 DIAGNOSIS — L750 Bromhidrosis: Secondary | ICD-10-CM | POA: Insufficient documentation

## 2015-05-10 LAB — URINE CULTURE

## 2015-05-10 NOTE — Assessment & Plan Note (Signed)
BP well controlled Current regimen effective and well tolerated Continue current medications at current doses  

## 2015-05-10 NOTE — Assessment & Plan Note (Signed)
Following with neurology - management per them

## 2015-05-10 NOTE — Assessment & Plan Note (Signed)
Urine dip looks normal Will send urine for culture

## 2015-05-11 NOTE — Progress Notes (Signed)
Cardiology Office Note   Date:  05/12/2015   ID:  Kimberly [name], DOB Jun 21, 1945, MRN [id number]  PCP:  [name], MD  Cardiologist:   Skeet Latch, MD   Chief Complaint  Patient presents with  . Follow-up    1 Month; SOB;none.CHEST PAIN;none. LIGHTHEADED/DIZZINESS;none.PAIN OR CRAMPING IN LEGS;none. EDEMA; feet and legs      Patient ID: Kimberly [name] is a 70 y.o. female with hypertension, coronary artery calcification, myasthenia gravis, recurrent R pleural effusion, hyperlipidemia who presents for evaluation of asymptomatic coronary calcifications.    Interval History 05/12/15: Kimberly [name] underwent cardiac cath that was negative for ischemia.  Her coronary arteries were noted to be tortuous.  She has been recovering from her UTI and diverticulitis.  SHe denies any chest pain or shortness of breath.  She did wear a heart monitor but the results are not available at this time.  She notes lower extremity edema that is at her baseline.  She has been told that she had a heart murmur in the past.  She reportedly had an echo over 10 years ago that didn't show any significant pathology.  She denies orthopnea or PND.  Interval History 04/11/15:  After her last appointment Kimberly [name] was started on spironolactone for hypertension.  She had a Lexiscan Cardiolite that was concerning for inferior and apical ischemia.  She denies chest pain or shortness of breath.  She remains unable to do much exercise due to Myasthenia and arthritis.  She was seen in the ED for abdominal pain on 3/28 and was noted to have UTI and diverticulitis.  The severe pain has improved but she continues to have persistent abdominal discomfort. She denies melena or hematochezia.  She denies lower extremity edema, orthopnea or PND.  History of Present Illness 03/14/15:Kimberly [name] saw Dr. Chase Caller or routine follow-up on 02/28/15.  She had a chest CT for follow-up on her pleural effusion.  She was noted to have  coronary calcification.  Kimberly [name] denies any chest pain or shortness of breath.  She has chronic lower extremity edema due to vasculitis.  She likes to walk for exercise but is limited by her myasthenia.  She denies orthopnea or PND.  She has noted 3 episodes of tachycardia that awakened her from sleeping.  This started three months ago and the last episode was three weeks ago.  She awakens from sleep and feels like her heart is racing irregularly.  She checked her Fitbit and it was 162 bpm.  The episodes last for hours and the most recent one lasted all night.  The symptoms get better when she gets up and walks around.  There is no associated chest pain, lightheadedness or shortness of breath.  This has never happened in the daytime or with exer 00 thantherightmouthareaofdarktodaytion.  Kimberly [name] checks her blood pressure at work and it typically runs in the 140-160s/70-90s.     Past Medical History  Diagnosis Date  . Myasthenia gravis without exacerbation (HCC)     [facility], Dr Anna Genre  . Diplopia   . Family history of ischemic heart disease   . Unspecified vitamin D deficiency     Dr Estanislado Pandy  . Other and unspecified hyperlipidemia   . Vasculitis ([place])     Dr Estanislado Pandy  . Myalgia and myositis, unspecified   . GERD (gastroesophageal reflux disease)   . Psoriasis     plantar ;Dr Jarome Matin  . Arthritis   . Heart murmur   .  Atrial tachycardia ([place]) 05/12/2015  . NSVT (nonsustained ventricular tachycardia) ([place]) 05/12/2015    Past Surgical History  Procedure Laterality Date  . Appendectomy      & exploratory for inflammation; Dr Hassell Done  . Total abdominal hysterectomy w/ bilateral salpingoophorectomy      For Fibroids & endometriosis  . Cholecystectomy      for stones  . Shoulder surgery      Left & Right  . Thoracentesis      for peripneumonic effusion  . Laminectomy  2010    T1-2; Dr Vertell Limber  . Cervical fusion      C5-6; Dr Vertell Limber  . Cardiac catheterization N/A 04/17/2015     Procedure: Left Heart Cath and Coronary Angiography;  Surgeon: Leonie Man, MD;  Location: [facility];  Service: Cardiovascular;  Laterality: N/A;     Current Outpatient Prescriptions  Medication Sig Dispense Refill  . aspirin 81 MG tablet Take 81 mg by mouth daily.    . folic acid (FOLVITE) 1 MG tablet TAKE 2 TABLETS BY MOUTH ONCE DAILY 180 tablet 3  . furosemide (LASIX) 40 MG tablet Take 1 tablet (40 mg total) by mouth daily. 30 tablet 1  . methotrexate (RHEUMATREX) 2.5 MG tablet Take 20 mg by mouth every Wednesday.    . predniSONE (DELTASONE) 1 MG tablet Take 2 mg by mouth daily with breakfast. Take with prednisone 5 mg    . predniSONE (DELTASONE) 5 MG tablet Take 5 mg by mouth daily with breakfast. Take with 2 tablets of 1 mg prednisone    . pyridostigmine (MESTINON) 60 MG tablet Take 1 tablet (60 mg total) by mouth 4 (four) times daily. 370 tablet 3  . spironolactone (ALDACTONE) 25 MG tablet Take 1 tablet (25 mg total) by mouth daily. 30 tablet 11  . valsartan (DIOVAN) 320 MG tablet Take 1 tablet (320 mg total) by mouth daily. 30 tablet 1   No current facility-administered medications for this visit.    Allergies:   Azithromycin; Levaquin; Tetracycline; Sulfonamide derivatives; Aleve; and Amlodipine besy-benazepril hcl    Social History:  The patient  reports that she has never smoked. She has never used smokeless tobacco. She reports that she does not drink alcohol or use illicit drugs.   Family History:  The patient's family history includes Cancer in her father; Heart attack (age of onset: 70) in her mother; Heart failure in her mother; Hypertension in her sister; Hyperthyroidism in her sister and sister; Lung cancer in her father; Stroke in her paternal grandmother. There is no history of Diabetes.   ROS:  Please see the history of present illness.   Otherwise, review of systems are positive for none.   All other systems are reviewed and negative.   PHYSICAL  EXAM: VS:  BP 148/70 mmHg  Pulse 89  Ht 5\' 2"  (1.575 m)  Wt 117.482 kg (259 lb)  BMI 47.36 kg/m2 , BMI Body mass index is 47.36 kg/(m^2). GENERAL:  Well appearing.  No acute distress. HEENT:  Pupils equal round and reactive, fundi not visualized, oral mucosa unremarkable NECK:  JVP 2 cm above the clavicle sitting upright.  Waveform within normal limits, carotid upstroke brisk and symmetric, no bruits LYMPHATICS:  No cervical adenopathy LUNGS:  Clear to auscultation bilaterally HEART:  RRR.  PMI not displaced or sustained,S1 and S2 within normal limits, no S3, no S4, no clicks, no rubs, II/VI systolic murmur at the LUSB ABD:  Flat, positive bowel sounds normal in frequency  in pitch, no bruits, no rebound, no guarding, no midline pulsatile mass, no hepatomegaly, no splenomegaly EXT:  2 plus pulses throughout, 2+ pitting edema to the mid tibia bilaterally, no cyanosis no clubbing SKIN:  Bilateral lower extremity erythema NEURO:  Cranial nerves II through XII grossly intact, motor grossly intact throughout PSYCH:  Cognitively intact, oriented to person place and time  EKG:  EKG is not ordered today.  28 Day Event Monitor 04/01/15:  Quality: Fair.  Baseline artifact. Predominant rhythm: sinus rhythm Average heart rate: 70 bpm Max heart rate: 126 bpm Min heart rate: 50 bpm  Short runs of atrial tachycardia lasting up to 11 seconds.  One 6 beat run of NSVT.   Lexiscan Cardiolite 03/28/15: 1. Nuclear stress EF: 71%. 2. The left ventricular ejection fraction is hyperdynamic (>65%). 3. There was no ST segment deviation noted during stress. 4. This is an intermediate risk study. 5. Findings consistent with ischemia.  Technically difficult study due to increased subdiaphragmatic activity; intermediate risk with small, severe, reversible defects in the apical and inferior basal walls consistent with ischemia; EF 71 with normal wall motion.  LHC 04/17/15: 6. The left ventricular systolic  function is normal. 7. Angiographically normal coronary arteries, but very tortuous   Angiographically no evidence of any significant lesions to explain the patient's abnormal stress test. There does appear to be mild calcification in the LAD distribution, but certainly not on the luminal aspect.  Recent Labs: 08/09/2014: TSH 2.20 02/17/2015: Magnesium 1.7 04/07/2015: ALT 18 04/11/2015: BUN 19; Creat 1.17*; Hemoglobin 11.0*; Platelets 497*; Potassium 4.1; Sodium 139    Lipid Panel    Component Value Date/Time   CHOL 256* 08/09/2014 0928   TRIG 180* 08/09/2014 0928   HDL 74 08/09/2014 0928   LDLCALC 146* 08/09/2014 0928      Wt Readings from Last 3 Encounters:  05/12/15 117.482 kg (259 lb)  05/09/15 117.482 kg (259 lb)  04/17/15 117.028 kg (258 lb)      ASSESSMENT AND PLAN:  # Coronary calcification: # Abnormal stress: A Lexiscan Cardiolite was obtained due to coronary calcifications noted on CT.  Cardiac cath did not reveal any significant coronary disease.  Continue aspirin. Avoid statins or nodal agents due to her history of myasthenia.  # Hypertension: BP is slightly elevated.  She thinks this is because of the walk back to the exam room.  She will continue to monitor it at home and call if it is >140/90.  Continue valsartan and spironolactone.   # Paroxysmal atrial tachycardia, NSVT: 30 day event monitor Showed runs of atrial tachycardia as well as one run of nonsustained ventricular tachycardia. Given her lack of obstructive coronary disease on cardiac catheterization we will not make any changes at this time. She is not able to take beta blockers or calcium channel blockers due to myasthenia gravis. We will obtain an echo to ensure she does not have any evidence of structural heart   Current medicines are reviewed at length with the patient today.  The patient does not have concerns regarding medicines.  The following changes have been made:  None  Labs/ tests ordered  today include:   Orders Placed This Encounter  Procedures  . ECHOCARDIOGRAM COMPLETE    Disposition:   FU with Tomeeka Plaugher C. Oval Linsey, MD, [facility] in 6 months    This note was written with the assistance of speech recognition software.  Please excuse any transcriptional errors.  Signed, Chasta Deshpande C. Oval Linsey, MD, [facility]  05/12/2015 4:11 PM  [facility]

## 2015-05-12 ENCOUNTER — Ambulatory Visit (INDEPENDENT_AMBULATORY_CARE_PROVIDER_SITE_OTHER): Payer: Medicare Other | Admitting: Cardiovascular Disease

## 2015-05-12 ENCOUNTER — Encounter: Payer: Self-pay | Admitting: Cardiovascular Disease

## 2015-05-12 VITALS — BP 148/70 | HR 89 | Ht 62.0 in | Wt 259.0 lb

## 2015-05-12 DIAGNOSIS — I471 Supraventricular tachycardia: Secondary | ICD-10-CM

## 2015-05-12 DIAGNOSIS — I4719 Other supraventricular tachycardia: Secondary | ICD-10-CM

## 2015-05-12 DIAGNOSIS — I480 Paroxysmal atrial fibrillation: Secondary | ICD-10-CM | POA: Insufficient documentation

## 2015-05-12 DIAGNOSIS — I4729 Other ventricular tachycardia: Secondary | ICD-10-CM | POA: Insufficient documentation

## 2015-05-12 DIAGNOSIS — I2510 Atherosclerotic heart disease of native coronary artery without angina pectoris: Secondary | ICD-10-CM

## 2015-05-12 DIAGNOSIS — R600 Localized edema: Secondary | ICD-10-CM | POA: Diagnosis not present

## 2015-05-12 DIAGNOSIS — I472 Ventricular tachycardia: Secondary | ICD-10-CM | POA: Diagnosis not present

## 2015-05-12 DIAGNOSIS — R011 Cardiac murmur, unspecified: Secondary | ICD-10-CM | POA: Diagnosis not present

## 2015-05-12 HISTORY — DX: Ventricular tachycardia: I47.2

## 2015-05-12 HISTORY — DX: Other supraventricular tachycardia: I47.19

## 2015-05-12 HISTORY — DX: Supraventricular tachycardia: I47.1

## 2015-05-12 HISTORY — DX: Other ventricular tachycardia: I47.29

## 2015-05-12 NOTE — Patient Instructions (Addendum)
Medication Instructions:  INCREASE YOUR LASIX (FUROSEMIDE) TO TWICE A DAY FOR 3 DAYS  Labwork: NONE  Testing/Procedures: Your physician has requested that you have an echocardiogram. Echocardiography is a painless test that uses sound waves to create images of your heart. It provides your doctor with information about the size and shape of your heart and how well your heart's chambers and valves are working. This procedure takes approximately one hour. There are no restrictions for this procedure. [address]  Follow-Up: Your physician wants you to follow-up in: Kimberly [name] should receive a reminder letter in the mail two months in advance. If you don't receive a letter, please call our office to schedule the follow-up appointment.   If you need a refill on your cardiac medications before your next appointment, please call your pharmacy.

## 2015-05-26 ENCOUNTER — Other Ambulatory Visit: Payer: Self-pay

## 2015-05-26 ENCOUNTER — Ambulatory Visit (HOSPITAL_COMMUNITY): Payer: Medicare Other | Attending: Cardiovascular Disease

## 2015-05-26 DIAGNOSIS — I472 Ventricular tachycardia: Secondary | ICD-10-CM | POA: Insufficient documentation

## 2015-05-26 DIAGNOSIS — R011 Cardiac murmur, unspecified: Secondary | ICD-10-CM

## 2015-05-26 DIAGNOSIS — J90 Pleural effusion, not elsewhere classified: Secondary | ICD-10-CM | POA: Insufficient documentation

## 2015-05-26 DIAGNOSIS — E785 Hyperlipidemia, unspecified: Secondary | ICD-10-CM | POA: Diagnosis not present

## 2015-05-26 DIAGNOSIS — R600 Localized edema: Secondary | ICD-10-CM | POA: Diagnosis not present

## 2015-05-26 DIAGNOSIS — I119 Hypertensive heart disease without heart failure: Secondary | ICD-10-CM | POA: Diagnosis not present

## 2015-05-26 DIAGNOSIS — I471 Supraventricular tachycardia: Secondary | ICD-10-CM | POA: Insufficient documentation

## 2015-06-12 ENCOUNTER — Telehealth: Payer: Self-pay | Admitting: *Deleted

## 2015-06-12 NOTE — Telephone Encounter (Signed)
-----   Message from Skeet Latch, MD sent at 05/28/2015  8:50 PM EDT ----- Normal echo.

## 2015-06-12 NOTE — Telephone Encounter (Signed)
Left message to call back  

## 2015-06-13 ENCOUNTER — Encounter: Payer: Self-pay | Admitting: Internal Medicine

## 2015-06-18 ENCOUNTER — Telehealth: Payer: Self-pay

## 2015-06-18 ENCOUNTER — Other Ambulatory Visit: Payer: Self-pay | Admitting: Internal Medicine

## 2015-06-18 ENCOUNTER — Other Ambulatory Visit (INDEPENDENT_AMBULATORY_CARE_PROVIDER_SITE_OTHER): Payer: Medicare Other

## 2015-06-18 DIAGNOSIS — R300 Dysuria: Secondary | ICD-10-CM

## 2015-06-18 LAB — URINALYSIS, ROUTINE W REFLEX MICROSCOPIC
Nitrite: POSITIVE — AB
SPECIFIC GRAVITY, URINE: 1.025 (ref 1.000–1.030)
Total Protein, Urine: 100 — AB
URINE GLUCOSE: 100 — AB
UROBILINOGEN UA: 2 — AB (ref 0.0–1.0)
pH: 7 (ref 5.0–8.0)

## 2015-06-18 MED ORDER — NITROFURANTOIN MONOHYD MACRO 100 MG PO CAPS: 100.0000 mg | ORAL_CAPSULE | Freq: Two times a day (BID) | ORAL | Status: DC

## 2015-06-18 NOTE — Telephone Encounter (Signed)
Started as painful urination, cloudy, minor odor. Orders placed. Pt will be in this afternoon for specimen. She states because of the Myasthenia gravis she has to be careful with taking curtain antibiotics.

## 2015-06-18 NOTE — Telephone Encounter (Signed)
Please advise 

## 2015-06-18 NOTE — Telephone Encounter (Signed)
Needs to leave a sample - please order.  What symptoms is she having?  We can start treatment if her symptoms are bad, but it is always best to confirm an infection first.

## 2015-06-18 NOTE — Telephone Encounter (Signed)
Patient believes she has a UTI she wants to know if you can just call something in or if she can just come into late and leave a simple. Call her on this number her phone is messing up at the time. [phone] Please advise or follow up.

## 2015-06-19 ENCOUNTER — Encounter: Payer: Self-pay | Admitting: Cardiovascular Disease

## 2015-06-19 NOTE — Telephone Encounter (Signed)
Follow up    The pt is retuning Melinda's call.

## 2015-06-19 NOTE — Telephone Encounter (Signed)
This encounter was created in error - please disregard.

## 2015-06-19 NOTE — Telephone Encounter (Signed)
Advised patient    Kimberly [name] at 06/19/2015 3:40 PM     Status: Signed       Expand All Collapse All   Follow up    The pt is retuning Kimberly [name]'s call.

## 2015-06-21 LAB — URINE CULTURE

## 2015-06-27 DIAGNOSIS — R109 Unspecified abdominal pain: Secondary | ICD-10-CM | POA: Diagnosis not present

## 2015-06-27 DIAGNOSIS — K420 Umbilical hernia with obstruction, without gangrene: Secondary | ICD-10-CM | POA: Diagnosis not present

## 2015-06-27 DIAGNOSIS — G7000 Myasthenia gravis without (acute) exacerbation: Secondary | ICD-10-CM | POA: Diagnosis not present

## 2015-07-25 DIAGNOSIS — H2513 Age-related nuclear cataract, bilateral: Secondary | ICD-10-CM | POA: Diagnosis not present

## 2015-07-31 DIAGNOSIS — Z09 Encounter for follow-up examination after completed treatment for conditions other than malignant neoplasm: Secondary | ICD-10-CM | POA: Diagnosis not present

## 2015-07-31 DIAGNOSIS — Z79899 Other long term (current) drug therapy: Secondary | ICD-10-CM | POA: Diagnosis not present

## 2015-07-31 DIAGNOSIS — G7000 Myasthenia gravis without (acute) exacerbation: Secondary | ICD-10-CM | POA: Diagnosis not present

## 2015-07-31 DIAGNOSIS — M170 Bilateral primary osteoarthritis of knee: Secondary | ICD-10-CM | POA: Diagnosis not present

## 2015-07-31 DIAGNOSIS — L408 Other psoriasis: Secondary | ICD-10-CM | POA: Diagnosis not present

## 2015-08-08 ENCOUNTER — Other Ambulatory Visit: Payer: Self-pay | Admitting: Internal Medicine

## 2015-08-08 DIAGNOSIS — I10 Essential (primary) hypertension: Secondary | ICD-10-CM

## 2015-08-28 DIAGNOSIS — H2512 Age-related nuclear cataract, left eye: Secondary | ICD-10-CM | POA: Diagnosis not present

## 2015-08-28 DIAGNOSIS — H2511 Age-related nuclear cataract, right eye: Secondary | ICD-10-CM | POA: Diagnosis not present

## 2015-09-03 DIAGNOSIS — H2511 Age-related nuclear cataract, right eye: Secondary | ICD-10-CM | POA: Diagnosis not present

## 2015-09-11 DIAGNOSIS — H2511 Age-related nuclear cataract, right eye: Secondary | ICD-10-CM | POA: Diagnosis not present

## 2015-10-07 ENCOUNTER — Other Ambulatory Visit: Payer: Self-pay | Admitting: Internal Medicine

## 2015-10-07 DIAGNOSIS — I10 Essential (primary) hypertension: Secondary | ICD-10-CM

## 2015-10-13 ENCOUNTER — Other Ambulatory Visit: Payer: Self-pay | Admitting: Internal Medicine

## 2015-10-13 DIAGNOSIS — I10 Essential (primary) hypertension: Secondary | ICD-10-CM

## 2015-10-18 ENCOUNTER — Other Ambulatory Visit: Payer: Self-pay | Admitting: Radiology

## 2015-10-18 DIAGNOSIS — Z79899 Other long term (current) drug therapy: Secondary | ICD-10-CM

## 2015-10-24 DIAGNOSIS — Z23 Encounter for immunization: Secondary | ICD-10-CM | POA: Diagnosis not present

## 2015-11-12 ENCOUNTER — Telehealth: Payer: Self-pay | Admitting: Radiology

## 2015-11-12 ENCOUNTER — Other Ambulatory Visit: Payer: Self-pay | Admitting: Emergency Medicine

## 2015-11-12 DIAGNOSIS — I10 Essential (primary) hypertension: Secondary | ICD-10-CM

## 2015-11-12 MED ORDER — FUROSEMIDE 40 MG PO TABS: 40.0000 mg | ORAL_TABLET | Freq: Every day | ORAL | 1 refills | Status: DC

## 2015-11-12 MED ORDER — PREDNISONE 5 MG PO TABS: 5.0000 mg | ORAL_TABLET | Freq: Every day | ORAL | 0 refills | Status: DC

## 2015-11-12 MED ORDER — PREDNISONE 1 MG PO TABS: 2.0000 mg | ORAL_TABLET | Freq: Every day | ORAL | 0 refills | Status: DC

## 2015-11-12 NOTE — Telephone Encounter (Signed)
Refill request received via fax for prednisone 1mg 

## 2015-11-12 NOTE — Telephone Encounter (Signed)
Last visit 07/31/15 Next visit 12/23/15 prednisone 7 mg a day Ok to refill per Dr Estanislado Pandy

## 2015-11-19 ENCOUNTER — Other Ambulatory Visit: Payer: Self-pay | Admitting: Rheumatology

## 2015-11-19 NOTE — Telephone Encounter (Signed)
She is on long-term prednisone Ok to refill per Dr Estanislado Pandy  Last visit 07/31/15 Next visit 12/23/15

## 2015-12-23 ENCOUNTER — Ambulatory Visit: Payer: Self-pay | Admitting: Rheumatology

## 2016-01-21 ENCOUNTER — Other Ambulatory Visit: Payer: Self-pay | Admitting: Rheumatology

## 2016-01-21 NOTE — Telephone Encounter (Signed)
Last Visit: 07/31/15 Next Visit due December 2017. Message sent to the front to schedule patient.  Labs: 07/31/15 c/w prev. Left message to advise patient she is in need of labs. Requested patient to call the office.

## 2016-02-02 DIAGNOSIS — Z961 Presence of intraocular lens: Secondary | ICD-10-CM | POA: Diagnosis not present

## 2016-02-02 DIAGNOSIS — H43812 Vitreous degeneration, left eye: Secondary | ICD-10-CM | POA: Diagnosis not present

## 2016-02-03 ENCOUNTER — Other Ambulatory Visit: Payer: Self-pay | Admitting: Rheumatology

## 2016-02-03 NOTE — Telephone Encounter (Signed)
Last visit: 07/31/15 Next Visit was due December 2017. Message sent to the front to schedule patient. Labs: 10/22/15 WBC 14.0, Hgb 11.2 Left message to advise patient she is due to update labs.  Okay to refill MTX?

## 2016-02-03 NOTE — Telephone Encounter (Signed)
ok 

## 2016-02-04 ENCOUNTER — Telehealth: Payer: Self-pay | Admitting: Rheumatology

## 2016-02-04 NOTE — Telephone Encounter (Signed)
-----   Message from Carole Binning, LPN sent at 075-GRM 10:40 AM EST ----- Regarding: Please schedule patient for follow visit Please schedule patient for follow up visit. Patient was due for a follow visit in December 2017. Thanks!

## 2016-02-04 NOTE — Telephone Encounter (Signed)
LMOM for patient to call back to schedule appointment.  

## 2016-02-11 DIAGNOSIS — Z79899 Other long term (current) drug therapy: Secondary | ICD-10-CM | POA: Diagnosis not present

## 2016-02-21 ENCOUNTER — Other Ambulatory Visit: Payer: Self-pay | Admitting: Rheumatology

## 2016-02-22 ENCOUNTER — Other Ambulatory Visit: Payer: Self-pay | Admitting: Neurology

## 2016-02-23 NOTE — Telephone Encounter (Signed)
Last visit: 07/31/15 Next Visit was due December 2017. Message sent to the front to schedule patient. Labs: 02/11/16 WNL  Okay to refill Prednisone?

## 2016-02-23 NOTE — Telephone Encounter (Signed)
I have refilled prednisone 7 mg every day; NINETY-day supply Patient has leukocytoclastic vasculitisShe needs to be seen in the office soon but I cannot stop the prednisone so I have refilled it; be sure she is seen in 2-3 weeks pls

## 2016-02-24 ENCOUNTER — Telehealth: Payer: Self-pay | Admitting: Rheumatology

## 2016-02-24 NOTE — Telephone Encounter (Signed)
Left message on machine for patient to call back to schedule follow up for within the next few weeks.

## 2016-03-04 ENCOUNTER — Other Ambulatory Visit: Payer: Self-pay | Admitting: Cardiovascular Disease

## 2016-03-04 NOTE — Telephone Encounter (Signed)
Review for refill. 

## 2016-03-04 NOTE — Telephone Encounter (Signed)
Rx(s) sent to pharmacy electronically.  

## 2016-03-18 ENCOUNTER — Other Ambulatory Visit: Payer: Self-pay | Admitting: Rheumatology

## 2016-03-18 MED ORDER — PREDNISONE 1 MG PO TABS: 2.0000 mg | ORAL_TABLET | Freq: Every day | ORAL | 0 refills | Status: DC

## 2016-03-18 NOTE — Telephone Encounter (Signed)
Last visit: 07/31/15 Next Visit: 04/15/16 Labs: 02/11/16 WNL  Okay to refill Prednisone?

## 2016-03-18 NOTE — Telephone Encounter (Signed)
Patient is requesting prednisone 1mg  refill to be [facility] to [facility] [address]. Patient states she takes the 5mg  and (2) 1 mg to equal the 7mg  of prednisone.

## 2016-04-05 ENCOUNTER — Other Ambulatory Visit: Payer: Self-pay | Admitting: Internal Medicine

## 2016-04-05 DIAGNOSIS — I10 Essential (primary) hypertension: Secondary | ICD-10-CM

## 2016-04-07 DIAGNOSIS — M47812 Spondylosis without myelopathy or radiculopathy, cervical region: Secondary | ICD-10-CM | POA: Insufficient documentation

## 2016-04-07 DIAGNOSIS — M170 Bilateral primary osteoarthritis of knee: Secondary | ICD-10-CM | POA: Insufficient documentation

## 2016-04-07 DIAGNOSIS — M25611 Stiffness of right shoulder, not elsewhere classified: Secondary | ICD-10-CM | POA: Insufficient documentation

## 2016-04-07 NOTE — Progress Notes (Signed)
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 female with history of leukocytoclastic vasculitis and osteoarthritis. She returns today after her last visit in July 2017. She states she has not had any new rash since the last visit. She's been having increased pain and discomfort in all of her joints. She does describes pain in her shoulders, elbows, wrists, hip joints, knee joints and ankles. She has not noticed any joint swelling. Her psoriasis is better with topical therapy. Declines any flares of myasthenia gravis since the last visit.  Activities of Daily Living:  Patient reports morning stiffness for 0 minute.   Patient Reports nocturnal pain.  Difficulty dressing/grooming: Denies Difficulty climbing stairs: Reports Difficulty getting out of chair: Reports Difficulty using hands for taps, buttons, cutlery, and/or writing: Denies   Review of Systems  Constitutional: Negative for fatigue, night sweats, weight gain, weight loss and weakness.  HENT: Negative for mouth sores, trouble swallowing, trouble swallowing, mouth dryness and nose dryness.   Eyes: Positive for dryness. Negative for pain, redness and visual disturbance.       Status post cataract surgery  Respiratory: Negative for cough, shortness of breath and difficulty breathing.   Cardiovascular: Negative for chest pain, palpitations, hypertension, irregular heartbeat and swelling in legs/feet.  Gastrointestinal: Positive for blood in stool and constipation. Negative for diarrhea.  Endocrine: Negative for increased urination.  Genitourinary: Negative for vaginal dryness.  Musculoskeletal: Positive for arthralgias and joint pain. Negative for joint swelling,  myalgias, muscle weakness, morning stiffness, muscle tenderness and myalgias.  Skin: Negative for color change, rash, hair loss, skin tightness, ulcers and sensitivity to sunlight.  Allergic/Immunologic: Negative for susceptible to infections.  Neurological: Negative for dizziness, memory loss and night sweats.  Hematological: Negative for swollen glands.  Psychiatric/Behavioral: Positive for sleep disturbance. Negative for depressed mood. The patient is not nervous/anxious.     PMFS History:  Patient Active Problem List   Diagnosis Date Noted  . Psoriasis 04/13/2016  . History of diverticulitis 04/13/2016  . Osteopenia of multiple sites 04/13/2016  . DJD (degenerative joint disease), cervical 04/07/2016  . Primary osteoarthritis of both knees 04/07/2016  . Decreased abduction of right shoulder joint 04/07/2016  . Atrial tachycardia ([place]) 05/12/2015  . NSVT (nonsustained ventricular tachycardia) ([place]) 05/12/2015  . Abnormal urine odor 05/10/2015  . Abnormal nuclear stress test - INTERMEDIATE RISK 04/14/2015  . Coronary artery calcification seen on CAT scan 02/28/2015  . Fever, unspecified 02/17/2015  . Left lower quadrant pain 02/17/2015  . Hyperglycemia 08/09/2014  . Community acquired pneumonia 03/29/2013  . Dyspnea 03/20/2013  . Hypokalemia 12/31/2012  . Umbilical hernia 81/01/7508  . Pleural effusion 05/10/2012  . Myasthenia gravis without exacerbation ([place]) 02/12/2010  . DIPLOPIA 11/21/2009  . DISTURBANCE OF SKIN SENSATION 11/21/2009  . Vitamin D deficiency 06/25/2008  . Hyperlipidemia 06/25/2008  . Essential hypertension 06/25/2008  . Leukocytoclastic vasculitis ([place]) 06/19/2007  . MUSCLE WEAKNESS (GENERALIZED) 02/10/2007  . ELEVATED SEDIMENTATION RATE 02/10/2007  . Unspecified Myalgia and Myositis 01/24/2007  . EDEMA- LOCALIZED 01/24/2007  . ANA POSITIVE, HX OF  01/24/2007    Past Medical History:  Diagnosis Date  . Arthritis   . Atrial tachycardia ([place]) 05/12/2015    . Diplopia   . Family history of ischemic heart disease   . GERD (gastroesophageal reflux disease)   . Heart murmur   . Myalgia and myositis, unspecified   . Myasthenia gravis without exacerbation ([facility])    [facility], Dr Anna Genre  . NSVT (nonsustained ventricular tachycardia) ([place]) 05/12/2015  . Other and unspecified hyperlipidemia   . Psoriasis    plantar ;Dr Jarome Matin  . Unspecified vitamin D deficiency    Dr Estanislado Pandy  . Vasculitis ([place])    Dr Estanislado Pandy    Family History  Problem Relation Age of Onset  . Heart attack Mother 53  . Heart failure Mother   . Stroke Paternal Grandmother     in 72s  . Lung cancer Father   . Cancer Father     lung  . Hypertension Sister   . Hyperthyroidism Sister     [place]  . Hyperthyroidism Sister     Graves  . Diabetes Neg Hx    Past Surgical History:  Procedure Laterality Date  . APPENDECTOMY     & exploratory for inflammation; Dr Hassell Done  . CARDIAC CATHETERIZATION N/A 04/17/2015   Procedure: Left Heart Cath and Coronary Angiography;  Surgeon: Leonie Man, MD;  Location: [facility];  Service: Cardiovascular;  Laterality: N/A;  . CERVICAL FUSION     C5-6; Dr Vertell Limber  . CHOLECYSTECTOMY     for stones  . LAMINECTOMY  2010   T1-2; Dr Vertell Limber  . SHOULDER SURGERY     Left & Right  . THORACENTESIS     for peripneumonic effusion  . TOTAL ABDOMINAL HYSTERECTOMY W/ BILATERAL SALPINGOOPHORECTOMY     For Fibroids & endometriosis   Social History   Social History Narrative   Lives with husband in a one story home.  Had one child that only lived for 14 hours.     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          Epworth Sleepiness Scale = 2 (as of 03/14/2015)     Objective: Vital Signs: BP 125/71   Pulse 75   Resp 13   Ht 5\' 3"  (1.6 m)   Wt 275 lb (124.7 kg)   BMI 48.71 kg/m    Physical Exam  Constitutional: She is oriented to person, place, and time. She appears well-developed and  well-nourished.  HENT:  Head: Normocephalic and atraumatic.  Eyes: Conjunctivae and EOM are normal.  Neck: Normal range of motion.  Cardiovascular: Normal rate, regular rhythm, normal heart sounds and intact distal pulses.   Pulmonary/Chest: Effort normal and breath sounds normal.  Abdominal: Soft. Bowel sounds are normal.  Lymphadenopathy:    She has no cervical adenopathy.  Neurological: She is alert and oriented to person, place, and time.  Skin: Skin is warm and dry. Capillary refill takes less than 2 seconds.  Psychiatric: She has a normal mood and affect. Her behavior is normal.  Nursing note and vitals reviewed.    Musculoskeletal Exam: C-spine good range of motion thoracic spine good range of motion she is some discomfort with range of motion of her lumbar spine. Shoulder joint abduction was limited to about 70 on the right and 90 on the left. Elbow joints wrist joint MCPs PIPs DIPs with good range of motion with no synovitis. Hip joints are good range  of motion knee joints are good range of motion with no warmth swelling or effusion. See significant pitting edema on bilateral lower extremities.  CDAI Exam: No CDAI exam completed.    Investigation: Findings:  December 2013 T score -1.1 02/06/2013 TB gold negative July 2017 CBC WBC 14.0 hemoglobin 11.2, CMP normal   Imaging: No results found.  Speciality Comments: No specialty comments available.    Procedures:  No procedures performed Allergies: Azithromycin; Levaquin [levofloxacin in d5w]; Tetracycline; Sulfonamide derivatives; Aleve [naproxen]; and Amlodipine besy-benazepril hcl   Assessment / Plan:     Visit Diagnoses: Leukocytoclastic vasculitis ([place]) - biopsy-proven.  She had lower extremity involvement. She had no recurrence of rash since the last visit.   - Plan: Sedimentation rate  High risk medication use - Prednisone7 mg po qd, methotrexate 8 tablets by mouth every week, folic acid 2 mg by mouth daily.  She had difficulty tapering her prednisone in the past. We had detailed discussion about tapering prednisone again by 1 mg and see how she tolerates that. Long-term use of prednisone and side effects were discussed at length. - Plan: CBC with Differential/Platelet, COMPLETE METABOLIC PANEL WITH GFR. I'll check her labs today. Her labs will be due every 3 months.  DJD (degenerative joint disease), cervical: Chronic pain  Primary osteoarthritis of both knees - Bilateral severe: Chronic pain  Bilateral rotator cuff tear repair - Bilateral limited abduction: She continues to have discomfort in her shoulders.  Psoriasis - Followed up by Dr. Jarome Matin: According to patient the psoriasis much better now.  Myasthenia gravis without exacerbation ([place]): She denies any recent exacerbation.  Vitamin D deficiency  Essential hypertension  Mixed hyperlipidemia  History of diverticulitis  Osteopenia of multiple sites - T score -1.1 2013. Patient has not had a bone density and she's been on prednisone. She states she will get bone density through her PCP.  Frequency of urination - Plan: Urinalysis, Routine w reflex microscopic    Orders: Orders Placed This Encounter  Procedures  . CBC with Differential/Platelet  . COMPLETE METABOLIC PANEL WITH GFR  . Sedimentation rate  . Urinalysis, Routine w reflex microscopic   No orders of the defined types were placed in this encounter.   Face-to-face time spent with patient was 35 minutes. 50% of time was spent in counseling and coordination of care.  Follow-Up Instructions: Return in about 5 months (around 09/15/2016) for Osteoarthritis, vasculitis.   Bo Merino, MD  Note - This record has been created using Editor, commissioning.  Chart creation errors have been sought, but may not always  have been located. Such creation errors do not reflect on  the standard of medical care.

## 2016-04-11 DIAGNOSIS — J189 Pneumonia, unspecified organism: Secondary | ICD-10-CM

## 2016-04-11 HISTORY — DX: Pneumonia, unspecified organism: J18.9

## 2016-04-13 DIAGNOSIS — M8589 Other specified disorders of bone density and structure, multiple sites: Secondary | ICD-10-CM | POA: Insufficient documentation

## 2016-04-13 DIAGNOSIS — Z8719 Personal history of other diseases of the digestive system: Secondary | ICD-10-CM | POA: Insufficient documentation

## 2016-04-13 DIAGNOSIS — L409 Psoriasis, unspecified: Secondary | ICD-10-CM | POA: Insufficient documentation

## 2016-04-15 ENCOUNTER — Encounter: Payer: Self-pay | Admitting: Rheumatology

## 2016-04-15 ENCOUNTER — Ambulatory Visit (INDEPENDENT_AMBULATORY_CARE_PROVIDER_SITE_OTHER): Payer: Medicare Other | Admitting: Rheumatology

## 2016-04-15 VITALS — BP 125/71 | HR 75 | Resp 13 | Ht 63.0 in | Wt 275.0 lb

## 2016-04-15 DIAGNOSIS — E559 Vitamin D deficiency, unspecified: Secondary | ICD-10-CM | POA: Diagnosis not present

## 2016-04-15 DIAGNOSIS — M47812 Spondylosis without myelopathy or radiculopathy, cervical region: Secondary | ICD-10-CM

## 2016-04-15 DIAGNOSIS — M5030 Other cervical disc degeneration, unspecified cervical region: Secondary | ICD-10-CM

## 2016-04-15 DIAGNOSIS — Z79899 Other long term (current) drug therapy: Secondary | ICD-10-CM

## 2016-04-15 DIAGNOSIS — M8589 Other specified disorders of bone density and structure, multiple sites: Secondary | ICD-10-CM | POA: Diagnosis not present

## 2016-04-15 DIAGNOSIS — M25611 Stiffness of right shoulder, not elsewhere classified: Secondary | ICD-10-CM

## 2016-04-15 DIAGNOSIS — G7000 Myasthenia gravis without (acute) exacerbation: Secondary | ICD-10-CM

## 2016-04-15 DIAGNOSIS — I10 Essential (primary) hypertension: Secondary | ICD-10-CM | POA: Diagnosis not present

## 2016-04-15 DIAGNOSIS — R350 Frequency of micturition: Secondary | ICD-10-CM

## 2016-04-15 DIAGNOSIS — E782 Mixed hyperlipidemia: Secondary | ICD-10-CM

## 2016-04-15 DIAGNOSIS — Z8719 Personal history of other diseases of the digestive system: Secondary | ICD-10-CM

## 2016-04-15 DIAGNOSIS — M170 Bilateral primary osteoarthritis of knee: Secondary | ICD-10-CM | POA: Diagnosis not present

## 2016-04-15 DIAGNOSIS — M310 Hypersensitivity angiitis: Secondary | ICD-10-CM | POA: Diagnosis not present

## 2016-04-15 DIAGNOSIS — L409 Psoriasis, unspecified: Secondary | ICD-10-CM | POA: Diagnosis not present

## 2016-04-15 LAB — CBC WITH DIFFERENTIAL/PLATELET
Basophils Absolute: 0 cells/uL (ref 0–200)
Basophils Relative: 0 %
EOS ABS: 0 {cells}/uL — AB (ref 15–500)
Eosinophils Relative: 0 %
HEMATOCRIT: 37.7 % (ref 35.0–45.0)
Hemoglobin: 12.4 g/dL (ref 11.7–15.5)
LYMPHS PCT: 7 %
Lymphs Abs: 889 cells/uL (ref 850–3900)
MCH: 29.2 pg (ref 27.0–33.0)
MCHC: 32.9 g/dL (ref 32.0–36.0)
MCV: 88.9 fL (ref 80.0–100.0)
MONO ABS: 889 {cells}/uL (ref 200–950)
MONOS PCT: 7 %
MPV: 8.9 fL (ref 7.5–12.5)
NEUTROS PCT: 86 %
Neutro Abs: 10922 cells/uL — ABNORMAL HIGH (ref 1500–7800)
Platelets: 311 10*3/uL (ref 140–400)
RBC: 4.24 MIL/uL (ref 3.80–5.10)
RDW: 17.2 % — AB (ref 11.0–15.0)
WBC: 12.7 10*3/uL — ABNORMAL HIGH (ref 3.8–10.8)

## 2016-04-15 LAB — COMPLETE METABOLIC PANEL WITH GFR
ALK PHOS: 58 U/L (ref 33–130)
ALT: 15 U/L (ref 6–29)
AST: 20 U/L (ref 10–35)
Albumin: 4.2 g/dL (ref 3.6–5.1)
BILIRUBIN TOTAL: 0.6 mg/dL (ref 0.2–1.2)
BUN: 23 mg/dL (ref 7–25)
CALCIUM: 9.3 mg/dL (ref 8.6–10.4)
CO2: 26 mmol/L (ref 20–31)
CREATININE: 1.16 mg/dL — AB (ref 0.50–0.99)
Chloride: 102 mmol/L (ref 98–110)
GFR, EST AFRICAN AMERICAN: 56 mL/min — AB (ref >=60)
GFR, EST NON AFRICAN AMERICAN: 48 mL/min — AB (ref >=60)
GLUCOSE: 100 mg/dL — AB (ref 65–99)
Potassium: 4.7 mmol/L (ref 3.5–5.3)
Sodium: 140 mmol/L (ref 135–146)
Total Protein: 7.1 g/dL (ref 6.1–8.1)

## 2016-04-15 NOTE — Patient Instructions (Signed)
Standing Labs We placed an order today for your standing lab work.    Please come back and get your standing labs in July and every 3 months.  We have open lab Monday through Friday from 8:30-11:30 AM and 1:30-4 PM at the office of Dr. Quantasia Stegner/Naitik Panwala, PA.   The office is located at [address], [address], [place], [place] [address] No appointment is necessary.   Labs are drawn by [facility].  You may receive a bill from [facility] for your lab work.    

## 2016-04-16 LAB — URINALYSIS, ROUTINE W REFLEX MICROSCOPIC
Bilirubin Urine: NEGATIVE
GLUCOSE, UA: NEGATIVE
KETONES UR: NEGATIVE
Nitrite: NEGATIVE
PH: 5 (ref 5.0–8.0)
Protein, ur: NEGATIVE
Specific Gravity, Urine: 1.01 (ref 1.001–1.035)

## 2016-04-16 LAB — URINALYSIS, MICROSCOPIC ONLY
Crystals: NONE SEEN [HPF]
WBC, UA: >60 WBC/HPF — AB (ref <=5)
YEAST: NONE SEEN [HPF]

## 2016-04-16 LAB — SEDIMENTATION RATE: Sed Rate: 9 mm/hr (ref 0–30)

## 2016-04-16 NOTE — Progress Notes (Signed)
Needs urine cx and s. She may do at PCP or here

## 2016-04-19 ENCOUNTER — Telehealth: Payer: Self-pay | Admitting: Internal Medicine

## 2016-04-19 DIAGNOSIS — R300 Dysuria: Secondary | ICD-10-CM

## 2016-04-19 NOTE — Telephone Encounter (Signed)
Pt saw her rheumatologist on Friday and had blood work and a urinalysis done while she was there. The uranalysis came back abnormal abnormal but the rheumatologist did not want to treat it. She told the pt to contact her pcp to see about an antibiotic or to see if she needs to come in to have a culture done first. Please advise.

## 2016-04-19 NOTE — Telephone Encounter (Signed)
See if she is having symptoms - can bring in a sample - UA, UCx

## 2016-04-19 NOTE — Telephone Encounter (Signed)
Pt will come in in the morning to give urine sample.

## 2016-04-19 NOTE — Telephone Encounter (Signed)
Please advise 

## 2016-04-20 ENCOUNTER — Other Ambulatory Visit (INDEPENDENT_AMBULATORY_CARE_PROVIDER_SITE_OTHER): Payer: Medicare Other

## 2016-04-20 ENCOUNTER — Other Ambulatory Visit: Payer: Self-pay

## 2016-04-20 ENCOUNTER — Other Ambulatory Visit: Payer: Self-pay | Admitting: *Deleted

## 2016-04-20 DIAGNOSIS — R300 Dysuria: Secondary | ICD-10-CM

## 2016-04-20 DIAGNOSIS — N390 Urinary tract infection, site not specified: Secondary | ICD-10-CM

## 2016-04-20 LAB — URINALYSIS, ROUTINE W REFLEX MICROSCOPIC
Bilirubin Urine: NEGATIVE
Ketones, ur: NEGATIVE
Nitrite: POSITIVE — AB
Specific Gravity, Urine: >=1.03 — AB (ref 1.000–1.030)
Total Protein, Urine: 30 — AB
Urine Glucose: NEGATIVE
Urobilinogen, UA: 0.2 (ref 0.0–1.0)
pH: 5.5 (ref 5.0–8.0)

## 2016-04-20 MED ORDER — SPIRONOLACTONE 25 MG PO TABS: 25.0000 mg | ORAL_TABLET | Freq: Every day | ORAL | 0 refills | Status: DC

## 2016-04-20 NOTE — Telephone Encounter (Signed)
Rx(s) sent to pharmacy electronically.  

## 2016-04-22 ENCOUNTER — Telehealth: Payer: Self-pay | Admitting: Internal Medicine

## 2016-04-22 LAB — URINE CULTURE

## 2016-04-22 NOTE — Telephone Encounter (Signed)
Patient requesting call back as soon as possible with lab results on UTI.  Patient states she needs some sort of relief.

## 2016-04-23 ENCOUNTER — Other Ambulatory Visit: Payer: Self-pay | Admitting: Internal Medicine

## 2016-04-23 MED ORDER — NITROFURANTOIN MONOHYD MACRO 100 MG PO CAPS: 100.0000 mg | ORAL_CAPSULE | Freq: Two times a day (BID) | ORAL | 0 refills | Status: DC

## 2016-04-23 NOTE — Telephone Encounter (Signed)
Spoke with pt to give lab results. Pt was upset bc she was told this morning by a scheduler that her lab results were negative when the results showed an infection.   Appt was scheduled for pt, she had Medicare and BCBS sup. Should a Wellness be scheduled as well?

## 2016-04-23 NOTE — Telephone Encounter (Signed)
Yes, please schedule wellness w/ Sharee Pimple for same day

## 2016-04-23 NOTE — Telephone Encounter (Signed)
Pt called wanting to speak to Lovena Le, states she has requested to speak to taylor for 2 days about test results, she said if she does not get a call back today she will go to administration.

## 2016-04-27 NOTE — Telephone Encounter (Signed)
Tammy will you please contact this pt to schedule Wellness with Sharee Pimple the same day as her appt with Dr Quay Burow. THX!

## 2016-04-27 NOTE — Telephone Encounter (Signed)
Left vm for patient to give [place] a call back to schedule AWV.  Sharee Pimple, will not be in the same day of patients appointment with Dr. Quay Burow.  Can either complete wellness on separate day or move both to a separate day.

## 2016-05-04 ENCOUNTER — Telehealth: Payer: Self-pay | Admitting: Internal Medicine

## 2016-05-04 MED ORDER — NITROFURANTOIN MONOHYD MACRO 100 MG PO CAPS: 100.0000 mg | ORAL_CAPSULE | Freq: Two times a day (BID) | ORAL | 0 refills | Status: DC

## 2016-05-04 NOTE — Telephone Encounter (Signed)
Pt just finished her anti-bio on Friday, and Pt woke up today with blood in her urine. She would like a refill of the anti-bio or something different.  [facility] [address]

## 2016-05-04 NOTE — Telephone Encounter (Signed)
Will continue antibiotics for 5 more days - if she has any remaining symptoms she should be seen so we can recheck her urine.

## 2016-05-06 NOTE — Telephone Encounter (Signed)
LVM informing pt of MDs response.  

## 2016-05-08 ENCOUNTER — Inpatient Hospital Stay (HOSPITAL_COMMUNITY)
Admission: EM | Admit: 2016-05-08 | Discharge: 2016-05-10 | DRG: 194 | Disposition: A | Discharge status: Home / Self Care | Payer: Medicare Other | Attending: Internal Medicine | Admitting: Internal Medicine

## 2016-05-08 ENCOUNTER — Emergency Department (HOSPITAL_COMMUNITY): Payer: Medicare Other

## 2016-05-08 ENCOUNTER — Encounter (HOSPITAL_COMMUNITY): Payer: Self-pay | Admitting: Emergency Medicine

## 2016-05-08 DIAGNOSIS — Z888 Allergy status to other drugs, medicaments and biological substances status: Secondary | ICD-10-CM

## 2016-05-08 DIAGNOSIS — E559 Vitamin D deficiency, unspecified: Secondary | ICD-10-CM | POA: Diagnosis present

## 2016-05-08 DIAGNOSIS — Z7982 Long term (current) use of aspirin: Secondary | ICD-10-CM

## 2016-05-08 DIAGNOSIS — G7000 Myasthenia gravis without (acute) exacerbation: Secondary | ICD-10-CM | POA: Diagnosis present

## 2016-05-08 DIAGNOSIS — K219 Gastro-esophageal reflux disease without esophagitis: Secondary | ICD-10-CM | POA: Diagnosis present

## 2016-05-08 DIAGNOSIS — J189 Pneumonia, unspecified organism: Principal | ICD-10-CM | POA: Diagnosis present

## 2016-05-08 DIAGNOSIS — Z90710 Acquired absence of both cervix and uterus: Secondary | ICD-10-CM

## 2016-05-08 DIAGNOSIS — Z823 Family history of stroke: Secondary | ICD-10-CM

## 2016-05-08 DIAGNOSIS — Z9049 Acquired absence of other specified parts of digestive tract: Secondary | ICD-10-CM

## 2016-05-08 DIAGNOSIS — I10 Essential (primary) hypertension: Secondary | ICD-10-CM | POA: Diagnosis present

## 2016-05-08 DIAGNOSIS — Z882 Allergy status to sulfonamides status: Secondary | ICD-10-CM

## 2016-05-08 DIAGNOSIS — J918 Pleural effusion in other conditions classified elsewhere: Secondary | ICD-10-CM | POA: Diagnosis present

## 2016-05-08 DIAGNOSIS — Z881 Allergy status to other antibiotic agents status: Secondary | ICD-10-CM

## 2016-05-08 DIAGNOSIS — J181 Lobar pneumonia, unspecified organism: Secondary | ICD-10-CM

## 2016-05-08 DIAGNOSIS — L409 Psoriasis, unspecified: Secondary | ICD-10-CM | POA: Diagnosis present

## 2016-05-08 DIAGNOSIS — M609 Myositis, unspecified: Secondary | ICD-10-CM | POA: Diagnosis present

## 2016-05-08 DIAGNOSIS — Z801 Family history of malignant neoplasm of trachea, bronchus and lung: Secondary | ICD-10-CM

## 2016-05-08 DIAGNOSIS — Z8249 Family history of ischemic heart disease and other diseases of the circulatory system: Secondary | ICD-10-CM

## 2016-05-08 DIAGNOSIS — M8580 Other specified disorders of bone density and structure, unspecified site: Secondary | ICD-10-CM | POA: Diagnosis present

## 2016-05-08 DIAGNOSIS — Z7952 Long term (current) use of systemic steroids: Secondary | ICD-10-CM

## 2016-05-08 DIAGNOSIS — J90 Pleural effusion, not elsewhere classified: Secondary | ICD-10-CM

## 2016-05-08 DIAGNOSIS — Z886 Allergy status to analgesic agent status: Secondary | ICD-10-CM

## 2016-05-08 DIAGNOSIS — R079 Chest pain, unspecified: Secondary | ICD-10-CM | POA: Diagnosis not present

## 2016-05-08 DIAGNOSIS — Z981 Arthrodesis status: Secondary | ICD-10-CM

## 2016-05-08 DIAGNOSIS — R0602 Shortness of breath: Secondary | ICD-10-CM | POA: Diagnosis not present

## 2016-05-08 LAB — CBC
HCT: 38.2 % (ref 36.0–46.0)
HEMOGLOBIN: 12 g/dL (ref 12.0–15.0)
MCH: 28.5 pg (ref 26.0–34.0)
MCHC: 31.4 g/dL (ref 30.0–36.0)
MCV: 90.7 fL (ref 78.0–100.0)
PLATELETS: 311 10*3/uL (ref 150–400)
RBC: 4.21 MIL/uL (ref 3.87–5.11)
RDW: 15.8 % — ABNORMAL HIGH (ref 11.5–15.5)
WBC: 15.1 10*3/uL — ABNORMAL HIGH (ref 4.0–10.5)

## 2016-05-08 LAB — BASIC METABOLIC PANEL
ANION GAP: 10 (ref 5–15)
BUN: 15 mg/dL (ref 6–20)
CALCIUM: 9.3 mg/dL (ref 8.9–10.3)
CO2: 25 mmol/L (ref 22–32)
CREATININE: 0.82 mg/dL (ref 0.44–1.00)
Chloride: 103 mmol/L (ref 101–111)
GLUCOSE: 108 mg/dL — AB (ref 65–99)
Potassium: 3.7 mmol/L (ref 3.5–5.1)
Sodium: 138 mmol/L (ref 135–145)

## 2016-05-08 LAB — I-STAT TROPONIN, ED: TROPONIN I, POC: 0 ng/mL (ref 0.00–0.08)

## 2016-05-08 MED ORDER — ENOXAPARIN SODIUM 40 MG/0.4ML ~~LOC~~ SOLN
40.0000 mg | SUBCUTANEOUS | Status: DC
  Administered 2016-05-08 – 2016-05-09 (×2): 40 mg via SUBCUTANEOUS
  Filled 2016-05-08 (×2): qty 0.4

## 2016-05-08 MED ORDER — ASPIRIN 81 MG PO CHEW
81.0000 mg | CHEWABLE_TABLET | Freq: Every day | ORAL | Status: DC
  Administered 2016-05-08: 81 mg via ORAL
  Filled 2016-05-08: qty 1

## 2016-05-08 MED ORDER — DOXYCYCLINE HYCLATE 100 MG PO TABS
100.0000 mg | ORAL_TABLET | Freq: Once | ORAL | Status: AC
  Administered 2016-05-08: 100 mg via ORAL
  Filled 2016-05-08: qty 1

## 2016-05-08 MED ORDER — SODIUM CHLORIDE 0.9 % IV SOLN
INTRAVENOUS | Status: DC
  Administered 2016-05-08: 21:00:00 via INTRAVENOUS

## 2016-05-08 MED ORDER — SPIRONOLACTONE 25 MG PO TABS
25.0000 mg | ORAL_TABLET | Freq: Every day | ORAL | Status: DC
  Administered 2016-05-09 – 2016-05-10 (×2): 25 mg via ORAL
  Filled 2016-05-08 (×2): qty 1

## 2016-05-08 MED ORDER — DEXTROSE 5 % IV SOLN
1.0000 g | Freq: Once | INTRAVENOUS | Status: AC
  Administered 2016-05-08: 1 g via INTRAVENOUS
  Filled 2016-05-08: qty 10

## 2016-05-08 MED ORDER — DEXTROSE 5 % IV SOLN
1.0000 g | INTRAVENOUS | Status: DC
  Administered 2016-05-09: 1 g via INTRAVENOUS
  Filled 2016-05-08: qty 10

## 2016-05-08 MED ORDER — FUROSEMIDE 40 MG PO TABS
40.0000 mg | ORAL_TABLET | Freq: Every day | ORAL | Status: DC
  Administered 2016-05-09 – 2016-05-10 (×2): 40 mg via ORAL
  Filled 2016-05-08 (×2): qty 1

## 2016-05-08 MED ORDER — PYRIDOSTIGMINE BROMIDE 60 MG PO TABS
60.0000 mg | ORAL_TABLET | Freq: Four times a day (QID) | ORAL | Status: DC
  Administered 2016-05-08 – 2016-05-10 (×6): 60 mg via ORAL
  Filled 2016-05-08 (×8): qty 1

## 2016-05-08 MED ORDER — PREDNISONE 10 MG PO TABS
10.0000 mg | ORAL_TABLET | Freq: Every day | ORAL | Status: DC
  Administered 2016-05-09 – 2016-05-10 (×2): 10 mg via ORAL
  Filled 2016-05-08 (×2): qty 1

## 2016-05-08 MED ORDER — ACETAMINOPHEN 500 MG PO TABS
1000.0000 mg | ORAL_TABLET | Freq: Once | ORAL | Status: AC
  Administered 2016-05-08: 1000 mg via ORAL
  Filled 2016-05-08: qty 2

## 2016-05-08 MED ORDER — PREDNISONE 20 MG PO TABS
10.0000 mg | ORAL_TABLET | Freq: Once | ORAL | Status: AC
  Administered 2016-05-08: 10 mg via ORAL
  Filled 2016-05-08: qty 1

## 2016-05-08 MED ORDER — IRBESARTAN 300 MG PO TABS
300.0000 mg | ORAL_TABLET | Freq: Every day | ORAL | Status: DC
  Administered 2016-05-09 – 2016-05-10 (×2): 300 mg via ORAL
  Filled 2016-05-08 (×2): qty 1

## 2016-05-08 MED ORDER — FOLIC ACID 1 MG PO TABS
2.0000 mg | ORAL_TABLET | Freq: Every evening | ORAL | Status: DC
  Administered 2016-05-08 – 2016-05-09 (×2): 2 mg via ORAL
  Filled 2016-05-08 (×2): qty 2

## 2016-05-08 NOTE — ED Provider Notes (Signed)
[facility] DEPT Provider Note   CSN: [id number] Arrival date & time: 05/08/16  1529     History   Chief Complaint Chief Complaint  Patient presents with  . Chest Pain  . Shortness of Breath    HPI Kimberly [name] is a 71 y.o. female.  The history is provided by the patient.  Chest Pain   This is a new problem. The current episode started 12 to 24 hours ago. The problem occurs constantly. The problem has been gradually worsening. The pain is associated with breathing. The pain is present in the lateral region. The pain is at a severity of 4/10. The pain is moderate. The quality of the pain is described as pressure-like and pleuritic. The pain does not radiate. The symptoms are aggravated by certain positions and deep breathing. Associated symptoms include cough, malaise/fatigue and shortness of breath. Pertinent negatives include no abdominal pain, no back pain, no fever, no hemoptysis, no palpitations, no sputum production and no vomiting. She has tried rest for the symptoms. The treatment provided no relief. Risk factors include obesity.  Pertinent negatives for past medical history include no CAD, no COPD, no seizures and no strokes. Past medical history comments: myasthenia gravis   Pertinent negatives for family medical history include: no PE.  Procedure history is negative for cardiac catheterization.    Past Medical History:  Diagnosis Date  . Arthritis   . Atrial tachycardia ([place]) 05/12/2015  . Diplopia   . Family history of ischemic heart disease   . GERD (gastroesophageal reflux disease)   . Heart murmur   . Myalgia and myositis, unspecified   . Myasthenia gravis without exacerbation (HCC)    [facility], Dr Anna Genre  . NSVT (nonsustained ventricular tachycardia) ([place]) 05/12/2015  . Other and unspecified hyperlipidemia   . Psoriasis    plantar ;Dr Jarome Matin  . Unspecified vitamin D deficiency    Dr Estanislado Pandy  . Vasculitis [facility])    Dr Estanislado Pandy    Patient Active  Problem List   Diagnosis Date Noted  . Pneumonia 05/08/2016  . Psoriasis 04/13/2016  . History of diverticulitis 04/13/2016  . Osteopenia of multiple sites 04/13/2016  . DJD (degenerative joint disease), cervical 04/07/2016  . Primary osteoarthritis of both knees 04/07/2016  . Decreased abduction of right shoulder joint 04/07/2016  . Atrial tachycardia ([place]) 05/12/2015  . NSVT (nonsustained ventricular tachycardia) ([place]) 05/12/2015  . Abnormal urine odor 05/10/2015  . Abnormal nuclear stress test - INTERMEDIATE RISK 04/14/2015  . Coronary artery calcification seen on CAT scan 02/28/2015  . Fever, unspecified 02/17/2015  . Left lower quadrant pain 02/17/2015  . Hyperglycemia 08/09/2014  . Community acquired pneumonia 03/29/2013  . Dyspnea 03/20/2013  . Hypokalemia 12/31/2012  . Umbilical hernia 29/51/8841  . Pleural effusion 05/10/2012  . Myasthenia gravis without exacerbation ([place]) 02/12/2010  . DIPLOPIA 11/21/2009  . DISTURBANCE OF SKIN SENSATION 11/21/2009  . Vitamin D deficiency 06/25/2008  . Hyperlipidemia 06/25/2008  . Essential hypertension 06/25/2008  . Leukocytoclastic vasculitis ([place]) 06/19/2007  . MUSCLE WEAKNESS (GENERALIZED) 02/10/2007  . ELEVATED SEDIMENTATION RATE 02/10/2007  . Unspecified Myalgia and Myositis 01/24/2007  . EDEMA- LOCALIZED 01/24/2007  . ANA POSITIVE, HX OF 01/24/2007    Past Surgical History:  Procedure Laterality Date  . APPENDECTOMY     & exploratory for inflammation; Dr Hassell Done  . CARDIAC CATHETERIZATION N/A 04/17/2015   Procedure: Left Heart Cath and Coronary Angiography;  Surgeon: Leonie Man, MD;  Location: [facility];  Service: Cardiovascular;  Laterality: N/A;  . CERVICAL FUSION     C5-6; Dr Vertell Limber  . CHOLECYSTECTOMY     for stones  . LAMINECTOMY  2010   T1-2; Dr Vertell Limber  . SHOULDER SURGERY     Left & Right  . THORACENTESIS     for peripneumonic effusion  . TOTAL ABDOMINAL HYSTERECTOMY W/ BILATERAL SALPINGOOPHORECTOMY      For Fibroids & endometriosis    OB History    No data available       Home Medications    Prior to Admission medications   Medication Sig Start Date End Date Taking? Authorizing Provider  aspirin EC 81 MG tablet Take 81 mg by mouth at bedtime.   Yes Historical Provider, MD  folic acid (FOLVITE) 1 MG tablet TAKE 2 TABLETS BY MOUTH ONCE DAILY Patient taking differently: TAKE 2 TABLETS BY MOUTH ONCE DAILY AT BEDTIME 10/09/14  Yes Hendricks Limes, MD  furosemide (LASIX) 40 MG tablet Take 1 tablet (40 mg total) by mouth daily. 11/12/15  Yes [name], MD  methotrexate (RHEUMATREX) 2.5 MG tablet TAKE 8 TABLETS EVERY WEEK Patient taking differently: TAKE 8 TABLETS (20 MG) BY MOUTH EVERY THURSDAY 02/03/16  Yes Bo Merino, MD  nitrofurantoin, macrocrystal-monohydrate, (MACROBID) 100 MG capsule Take 100 mg by mouth 2 (two) times daily. 5 day course started 05/05/16 pm   Yes Historical Provider, MD  predniSONE (DELTASONE) 1 MG tablet Take 2 tablets (2 mg total) by mouth daily with breakfast. Take with prednisone 5 mg Patient taking differently: Take 2 mg by mouth daily with breakfast. Take with a 5 mg tablet for a 7 mg dose 03/18/16  Yes Naitik Panwala, PA-C  predniSONE (DELTASONE) 5 MG tablet TAKE 1 TABLET BY MOUTH EVERY DAY WITH BREAKFAST AND WITH 2,1MG  PREDNISONE Patient taking differently: TAKE 1 TABLET BY MOUTH EVERY DAY WITH BREAKFAST WITH 2 1 MG TABLETS FOR A 7 MG DOSE 02/23/16  Yes Naitik Panwala, PA-C  pyridostigmine (MESTINON) 60 MG tablet Take 1 tablet (60 mg total) by mouth 4 (four) times daily. Patient taking differently: Take 60 mg by mouth 3 (three) times daily.  11/25/14  Yes Donika Keith Rake, DO  spironolactone (ALDACTONE) 25 MG tablet Take 1 tablet (25 mg total) by mouth daily. 04/20/16  Yes Skeet Latch, MD  valsartan (DIOVAN) 320 MG tablet Take 1 tablet (320 mg total) by mouth daily. --- Office visit needed for further refills 04/05/16  Yes [name], MD  predniSONE  (DELTASONE) 5 MG tablet TAKE 1 TABLET EVERY MORNING Patient not taking: Reported on 04/15/2016 11/19/15   Bo Merino, MD  valsartan (DIOVAN) 320 MG tablet Take 1 tablet (320 mg total) by mouth daily. Patient not taking: Reported on 05/08/2016 03/10/15   [name], MD    Family History Family History  Problem Relation Age of Onset  . Heart attack Mother 72  . Heart failure Mother   . Stroke Paternal Grandmother     in 69s  . Lung cancer Father   . Cancer Father     lung  . Hypertension Sister   . Hyperthyroidism Sister     Graves  . Hyperthyroidism Sister     Graves  . Diabetes Neg Hx     Social History Social History  Substance Use Topics  . Smoking status: Never Smoker  . Smokeless tobacco: Never Used  . Alcohol use No     Allergies   Azithromycin; Levaquin [levofloxacin in d5w]; Tetracycline; Sulfonamide derivatives; Aleve [naproxen];  and Amlodipine besy-benazepril hcl   Review of Systems Review of Systems  Constitutional: Positive for fatigue and malaise/fatigue. Negative for chills and fever.  HENT: Negative for ear pain and sore throat.   Eyes: Negative for pain and visual disturbance.  Respiratory: Positive for cough, chest tightness and shortness of breath. Negative for hemoptysis and sputum production.   Cardiovascular: Positive for chest pain. Negative for palpitations.  Gastrointestinal: Negative for abdominal pain and vomiting.  Genitourinary: Negative for dysuria and hematuria.  Musculoskeletal: Negative for arthralgias and back pain.  Skin: Negative for color change and rash.  Neurological: Negative for seizures and syncope.  All other systems reviewed and are negative.    Physical Exam Updated Vital Signs BP 125/68   Pulse 74   Temp 98.5 F (36.9 C) (Oral)   Resp (!) 23   SpO2 97%   Physical Exam  Constitutional: She appears well-developed and well-nourished. No distress.  HENT:  Head: Normocephalic and atraumatic.  Eyes:  Conjunctivae and EOM are normal. Pupils are equal, round, and reactive to light.  Neck: Neck supple.  Cardiovascular: Normal rate and regular rhythm.   No murmur heard. Pulmonary/Chest: Effort normal. No respiratory distress. She has rhonchi in the left lower field.  Abdominal: Soft. There is no tenderness.  Musculoskeletal: She exhibits no edema.  Neurological: She is alert. No cranial nerve deficit or sensory deficit. GCS eye subscore is 4. GCS verbal subscore is 5. GCS motor subscore is 6.  Skin: Skin is warm and dry. No rash noted.  Psychiatric: She has a normal mood and affect. Her speech is normal.  Nursing note and vitals reviewed.    ED Treatments / Results  Labs (all labs ordered are listed, but only abnormal results are displayed) Labs Reviewed  BASIC METABOLIC PANEL - Abnormal; Notable for the following:       Result Value   Glucose, Bld 108 (*)    All other components within normal limits  CBC - Abnormal; Notable for the following:    WBC 15.1 (*)    RDW 15.8 (*)    All other components within normal limits  CULTURE, BLOOD (ROUTINE X 2)  CULTURE, BLOOD (ROUTINE X 2)  I-STAT TROPOININ, ED    EKG  EKG Interpretation  Date/Time:  Saturday May 08 2016 15:38:17 EDT Ventricular Rate:  83 PR Interval:  142 QRS Duration: 84 QT Interval:  392 QTC Calculation: 460 R Axis:   7 Text Interpretation:  Normal sinus rhythm Normal ECG Confirmed by Jeneen Rinks  MD, [place] ([id number]) on 05/08/2016 3:56:02 PM       Radiology Dg Chest 2 View  Result Date: 05/08/2016 CLINICAL DATA:  Shortness of breath and chest pain EXAM: CHEST  2 VIEW COMPARISON:  Chest radiograph February 11, 2014 and chest CT February 20, 2015 FINDINGS: There is patchy infiltrate in the posterior left base with small left pleural effusion. Lungs elsewhere clear. Heart size and pulmonary vascularity are normal. No adenopathy. There is postoperative change in the lower cervical spine. There is extensive arthropathy in  both shoulders. There appears to be avascular necrosis in both humeral heads, more advanced on the right than on the left. IMPRESSION: Small left pleural effusion with mild patchy infiltrate left base. Lungs elsewhere clear. Stable cardiac silhouette. Arthropathy with avascular necrosis in the shoulder regions. Electronically Signed   By: Lowella Grip [name] M.D.   On: 05/08/2016 16:11    Procedures Procedures (including critical care time)  Medications Ordered in ED Medications  predniSONE (DELTASONE) tablet 10 mg (10 mg Oral Given 05/08/16 1813)  cefTRIAXone (ROCEPHIN) 1 g in dextrose 5 % 50 mL IVPB (0 g Intravenous Stopped 05/08/16 1842)  doxycycline (VIBRA-TABS) tablet 100 mg (100 mg Oral Given 05/08/16 1813)  acetaminophen (TYLENOL) tablet 1,000 mg (1,000 mg Oral Given 05/08/16 1813)     Initial Impression / Assessment and Plan / ED Course  I have reviewed the triage vital signs and the nursing notes.  Pertinent labs & imaging results that were available during my care of the patient were reviewed by me and considered in my medical decision making (see chart for details).     71 year old female with history of myasthenia gravis and leukocytoclastic vasculitis on chronic steroids presents in the setting of left-sided chest discomfort, shortness of breath. Patient reports this started yesterday and has been worsening since that time. Patient with significant pain and dyspnea when lying flat. She reports this is similar to previous pneumonia and pleural effusions. Patient reports in the past these effusions have had to be drained by thoracentesis and patient presented early for further evaluation.  On arrival patient hemodynamically stable and afebrile. Patient appears in discomfort. Patient with rhonchorous breath sounds in left lower lobe. Differential diagnosis includes myself and crisis, pleural effusion, pneumonia, ACS. Laboratory analysis reveals elevation in white blood cell count  above patient's baseline, no electrolyte abnormalities, no elevation in troponin. EKG reveals no signs of ST segment elevation or depression or other signs of ischemia. Chest x-ray consistent with likely effusion left lower lobe with possible infection. As patient reports this is similar to previous pneumonia bouts and patient has significant comorbidities including myasthenia gravis will treat with antibiotics at this time. Patient has several allergies and this time we'll treat with Rocephin and doxycycline for community-acquired pneumonia. Additionally patient given pain medication early in course with improvement on reassessment. I have low suspicion for ACS at this time as we have etiology of patient's discomfort. Low suspicion for PE.  Due to patient's myasthenia gravis and significant discomfort with breathing at this time believe patient will require admission to the hospital for further monitoring and management of condition acquired pneumonia. Patient stable at time of transfer of care.  Final Clinical Impressions(s) / ED Diagnoses   Final diagnoses:  Community acquired pneumonia of left lower lobe of lung [facility])    New Prescriptions New Prescriptions   No medications on file     Esaw Grandchild, MD 05/08/16 [place], MD 05/09/16 2233

## 2016-05-08 NOTE — H&P (Signed)
TRH H&P   Patient Demographics:    Kimberly [name], is a 71 y.o. female  MRN: [id number]   DOB - [birth year]  Admit Date - 05/08/2016  Outpatient Primary MD for the patient is [name], MD  Referring MD/NP/PA: Blanchie Dessert  [facility]  Patient coming from: Home  Chief Complaint  Patient presents with  . Chest Pain  . Shortness of Breath      HPI:    Kimberly [name]  is a 71 y.o. female, w Myasthenia Gravis, Psoriasis, Myositis, Vasculitis who apparently presents w c/o left sided chest pain, pleuritic "I knew it felt like pneumonia" starting 1 day ago and therefore presented to ED for evaluation.  Pt denies fever, chills, cough, palp, sob, n/v, diarrhea.  Pt had CXR that showed small left pleural effusion with mild patchy infiltrate left base.  Pt has abx allergies that made it difficult for the ED to send the patient home.  Wbc 15.1,  Trop negative.  EKG nsr at 80, nl axis, early R progression, no st-t changes c/w ischemia.     Review of systems:    In addition to the HPI above,  No Fever-chills, No Headache, No changes with Vision or hearing, No problems swallowing food or Liquids,  No Abdominal pain, No Nausea or Vommitting, Bowel movements are regular, No Blood in stool or Urine, No dysuria, No new skin rashes or bruises, No new joints pains-aches,  No new weakness, tingling, numbness in any extremity, No recent weight gain or loss, No polyuria, polydypsia or polyphagia, No significant Mental Stressors.  A full 10 point Review of Systems was done, except as stated above, all other Review of Systems were negative.   With Past History of the following :    Past Medical History:  Diagnosis Date  . Arthritis   . Atrial tachycardia ([place]) 05/12/2015  . Diplopia   . Family history of ischemic heart disease   . GERD (gastroesophageal reflux  disease)   . Heart murmur   . Myalgia and myositis, unspecified   . Myasthenia gravis without exacerbation (HCC)    [facility], Dr Anna Genre  . NSVT (nonsustained ventricular tachycardia) ([place]) 05/12/2015  . Other and unspecified hyperlipidemia   . Psoriasis    plantar ;Dr Jarome Matin  . Unspecified vitamin D deficiency    Dr Estanislado Pandy  . Vasculitis ([place])    Dr Estanislado Pandy      Past Surgical History:  Procedure Laterality Date  . APPENDECTOMY     & exploratory for inflammation; Dr Hassell Done  . CARDIAC CATHETERIZATION N/A 04/17/2015   Procedure: Left Heart Cath and Coronary Angiography;  Surgeon: Leonie Man, MD;  Location: [facility] [place] [facility];  Service: Cardiovascular;  Laterality: N/A;  . CERVICAL FUSION     C5-6; Dr Vertell Limber  . CHOLECYSTECTOMY     for stones  . LAMINECTOMY  2010  T1-2; Dr Vertell Limber  . SHOULDER SURGERY     Left & Right  . THORACENTESIS     for peripneumonic effusion  . TOTAL ABDOMINAL HYSTERECTOMY W/ BILATERAL SALPINGOOPHORECTOMY     For Fibroids & endometriosis      Social History:     Social History  Substance Use Topics  . Smoking status: Never Smoker  . Smokeless tobacco: Never Used  . 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 Heart attack Mother 16  . Heart failure Mother   . Stroke Paternal Grandmother     in 68s  . Lung cancer Father   . Cancer Father     lung  . Hypertension Sister   . Hyperthyroidism Sister     Graves  . Hyperthyroidism Sister     Graves  . Diabetes Neg Hx       Home Medications:   Prior to Admission medications   Medication Sig Start Date End Date Taking? Authorizing Provider  aspirin 81 MG tablet Take 81 mg by mouth daily.    Historical Provider, MD  folic acid (FOLVITE) 1 MG tablet TAKE 2 TABLETS BY MOUTH ONCE DAILY 10/09/14   Hendricks Limes, MD  furosemide (LASIX) 40 MG tablet Take 1 tablet (40  mg total) by mouth daily. 11/12/15   [name], MD  methotrexate (RHEUMATREX) 2.5 MG tablet Take 20 mg by mouth every Wednesday.    Historical Provider, MD  methotrexate (RHEUMATREX) 2.5 MG tablet TAKE 8 TABLETS EVERY WEEK 02/03/16   Bo Merino, MD  predniSONE (DELTASONE) 1 MG tablet Take 2 tablets (2 mg total) by mouth daily with breakfast. Take with prednisone 5 mg 03/18/16   Naitik Panwala, PA-C  predniSONE (DELTASONE) 5 MG tablet TAKE 1 TABLET EVERY MORNING Patient not taking: Reported on 04/15/2016 11/19/15   Bo Merino, MD  predniSONE (DELTASONE) 5 MG tablet TAKE 1 TABLET BY MOUTH EVERY DAY WITH BREAKFAST AND WITH 2,1MG  PREDNISONE 02/23/16   Naitik Panwala, PA-C  pyridostigmine (MESTINON) 60 MG tablet Take 1 tablet (60 mg total) by mouth 4 (four) times daily. 11/25/14   Donika Keith Rake, DO  spironolactone (ALDACTONE) 25 MG tablet Take 1 tablet (25 mg total) by mouth daily. 04/20/16   Skeet Latch, MD  valsartan (DIOVAN) 320 MG tablet Take 1 tablet (320 mg total) by mouth daily. 03/10/15   [name], MD  valsartan (DIOVAN) 320 MG tablet Take 1 tablet (320 mg total) by mouth daily. --- Office visit needed for further refills 04/05/16   [name], MD     Allergies:     Allergies  Allergen Reactions  . Azithromycin     Angioedema  . Levaquin [Levofloxacin In D5w] Other (See Comments)    D/T MYASTHENIA GRAVIS  . Tetracycline     Rash  . Sulfonamide Derivatives     Seizure age 20 (Note: probably fever induced)  . Aleve [Naproxen]     Caused excessive salivation  . Amlodipine Besy-Benazepril Hcl     REACTION: cough     Physical Exam:   Vitals  Blood pressure (!) 126/43, pulse 76, temperature 98.5 F (36.9 C), temperature source Oral, resp. rate 15, SpO2 97 %.   1. General  lying in bed in NAD,    2. Normal affect and  insight, Not Suicidal or Homicidal, Awake Alert, Oriented X 3.  3. No F.N deficits, ALL C.Nerves Intact, Strength 5/5 all 4 extremities,  Sensation intact all 4 extremities, Plantars down going.  4. Ears and Eyes appear Normal, Conjunctivae clear, PERRLA. Moist Oral Mucosa.  5. Supple Neck, No JVD, No cervical lymphadenopathy appriciated, No Carotid Bruits.  6. Symmetrical Chest wall movement, Good air movement bilaterally, slight crackle left lung base, slightly dimished bs left lung base  7. RRR, No Gallops, Rubs or Murmurs, No Parasternal Heave.  8. Positive Bowel Sounds, Abdomen Soft, No tenderness, No organomegaly appriciated,No rebound -guarding or rigidity.  9.  No Cyanosis, Normal Skin Turgor, .1+ edema  10. Good muscle tone,  joints appear normal , no effusions, Normal ROM.  11. No Palpable Lymph Nodes in Neck or Axillae  Slight red area of the right distal lower ext    Data Review:    CBC  Recent Labs Lab 05/08/16 1549  WBC 15.1*  HGB 12.0  HCT 38.2  PLT 311  MCV 90.7  MCH 28.5  MCHC 31.4  RDW 15.8*   ------------------------------------------------------------------------------------------------------------------  Chemistries   Recent Labs Lab 05/08/16 1549  NA 138  K 3.7  CL 103  CO2 25  GLUCOSE 108*  BUN 15  CREATININE 0.82  CALCIUM 9.3   ------------------------------------------------------------------------------------------------------------------ CrCl cannot be calculated (Unknown ideal weight.). ------------------------------------------------------------------------------------------------------------------ No results for input(s): TSH, T4TOTAL, T3FREE, THYROIDAB in the last 72 hours.  Invalid input(s): FREET3  Coagulation profile No results for input(s): INR, PROTIME in the last 168 hours. ------------------------------------------------------------------------------------------------------------------- No results for input(s): DDIMER in the last 72  hours. -------------------------------------------------------------------------------------------------------------------  Cardiac Enzymes No results for input(s): CKMB, TROPONINI, MYOGLOBIN in the last 168 hours.  Invalid input(s): CK ------------------------------------------------------------------------------------------------------------------ No results found for: BNP   ---------------------------------------------------------------------------------------------------------------  Urinalysis    Component Value Date/Time   COLORURINE YELLOW 04/20/2016 [place] (A) 04/20/2016 0814   LABSPEC >=1.030 (A) 04/20/2016 0814   PHURINE 5.5 04/20/2016 [place] 04/20/2016 0814   HGBUR LARGE (A) 04/20/2016 0814   BILIRUBINUR NEGATIVE 04/20/2016 0814   BILIRUBINUR neg 05/09/2015 [place] 04/20/2016 0814   PROTEINUR NEGATIVE 04/15/2016 1528   UROBILINOGEN 0.2 04/20/2016 0814   NITRITE POSITIVE (A) 04/20/2016 0814   LEUKOCYTESUR SMALL (A) 04/20/2016 0814    ----------------------------------------------------------------------------------------------------------------   Imaging Results:    Dg Chest 2 View  Result Date: 05/08/2016 CLINICAL DATA:  Shortness of breath and chest pain EXAM: CHEST  2 VIEW COMPARISON:  Chest radiograph February 11, 2014 and chest CT February 20, 2015 FINDINGS: There is patchy infiltrate in the posterior left base with small left pleural effusion. Lungs elsewhere clear. Heart size and pulmonary vascularity are normal. No adenopathy. There is postoperative change in the lower cervical spine. There is extensive arthropathy in both shoulders. There appears to be avascular necrosis in both humeral heads, more advanced on the right than on the left. IMPRESSION: Small left pleural effusion with mild patchy infiltrate left base. Lungs elsewhere clear. Stable cardiac silhouette. Arthropathy with avascular necrosis in the  shoulder regions. Electronically Signed   By: Lowella Grip [name] M.D.   On: 05/08/2016 16:11      Assessment & Plan:    Active Problems:   Pneumonia    1. CAP tx with rocephin 1gm iv qday, doxycycline 100mg  po bid if tolerates po doxycline overnite.   2. Cp, atypical ekg negative for ischemic changes, trop negative Repeat trop I in  am.   3. Myasthenia gravis Prednisone at 10mg  po qday Methotrexate typically on Thursdays (Hold for now) due to immunosuppression  4. Pleural effusion, small left Hx of prior pleural thickeing on CTA 03/22/2013, and prior hx of pleural effusion Will obtain a lateral decubitus film in the am.   5. Edema Cont lasix, spironolactone  6. Hypertension Cont valsartan  DVT Prophylaxis Lovenox - SCDs  AM Labs Ordered, also please review Full Orders  Family Communication: Admission, patients condition and plan of care including tests being ordered have been discussed with the patient who indicate understanding and agree with the plan and Code Status.  Code Status FULL CODE  Likely DC to  home  Condition GUARDED    Consults called: none  Admission status: observation   Time spent in minutes : 45 minutes   Jani Gravel M.D on 05/08/2016 at 6:33 PM  Between 7am to 7pm - Pager - [phone]   After 7pm go to [email or web address] - password [email or web address]  [facility] - Office  [phone]

## 2016-05-08 NOTE — ED Triage Notes (Signed)
Pt states yesterday she started having CP and SOB, states pain is in her L rib cage, states it feels like pneumonia, pain is worse with inspiration. Pt appears uncomfortable.

## 2016-05-09 ENCOUNTER — Other Ambulatory Visit: Payer: Self-pay | Admitting: Rheumatology

## 2016-05-09 ENCOUNTER — Observation Stay (HOSPITAL_COMMUNITY): Payer: Medicare Other

## 2016-05-09 DIAGNOSIS — M8580 Other specified disorders of bone density and structure, unspecified site: Secondary | ICD-10-CM | POA: Diagnosis present

## 2016-05-09 DIAGNOSIS — G7000 Myasthenia gravis without (acute) exacerbation: Secondary | ICD-10-CM | POA: Diagnosis present

## 2016-05-09 DIAGNOSIS — J189 Pneumonia, unspecified organism: Secondary | ICD-10-CM | POA: Diagnosis present

## 2016-05-09 DIAGNOSIS — E559 Vitamin D deficiency, unspecified: Secondary | ICD-10-CM | POA: Diagnosis present

## 2016-05-09 DIAGNOSIS — Z888 Allergy status to other drugs, medicaments and biological substances status: Secondary | ICD-10-CM | POA: Diagnosis not present

## 2016-05-09 DIAGNOSIS — Z823 Family history of stroke: Secondary | ICD-10-CM | POA: Diagnosis not present

## 2016-05-09 DIAGNOSIS — Z7982 Long term (current) use of aspirin: Secondary | ICD-10-CM | POA: Diagnosis not present

## 2016-05-09 DIAGNOSIS — L409 Psoriasis, unspecified: Secondary | ICD-10-CM | POA: Diagnosis present

## 2016-05-09 DIAGNOSIS — Z7952 Long term (current) use of systemic steroids: Secondary | ICD-10-CM | POA: Diagnosis not present

## 2016-05-09 DIAGNOSIS — J918 Pleural effusion in other conditions classified elsewhere: Secondary | ICD-10-CM | POA: Diagnosis present

## 2016-05-09 DIAGNOSIS — Z881 Allergy status to other antibiotic agents status: Secondary | ICD-10-CM | POA: Diagnosis not present

## 2016-05-09 DIAGNOSIS — K219 Gastro-esophageal reflux disease without esophagitis: Secondary | ICD-10-CM | POA: Diagnosis present

## 2016-05-09 DIAGNOSIS — M609 Myositis, unspecified: Secondary | ICD-10-CM | POA: Diagnosis present

## 2016-05-09 DIAGNOSIS — Z9049 Acquired absence of other specified parts of digestive tract: Secondary | ICD-10-CM | POA: Diagnosis not present

## 2016-05-09 DIAGNOSIS — I10 Essential (primary) hypertension: Secondary | ICD-10-CM | POA: Diagnosis present

## 2016-05-09 DIAGNOSIS — Z882 Allergy status to sulfonamides status: Secondary | ICD-10-CM | POA: Diagnosis not present

## 2016-05-09 DIAGNOSIS — Z801 Family history of malignant neoplasm of trachea, bronchus and lung: Secondary | ICD-10-CM | POA: Diagnosis not present

## 2016-05-09 DIAGNOSIS — Z981 Arthrodesis status: Secondary | ICD-10-CM | POA: Diagnosis not present

## 2016-05-09 DIAGNOSIS — Z886 Allergy status to analgesic agent status: Secondary | ICD-10-CM | POA: Diagnosis not present

## 2016-05-09 DIAGNOSIS — J90 Pleural effusion, not elsewhere classified: Secondary | ICD-10-CM

## 2016-05-09 DIAGNOSIS — J181 Lobar pneumonia, unspecified organism: Secondary | ICD-10-CM | POA: Diagnosis not present

## 2016-05-09 DIAGNOSIS — Z8249 Family history of ischemic heart disease and other diseases of the circulatory system: Secondary | ICD-10-CM | POA: Diagnosis not present

## 2016-05-09 DIAGNOSIS — Z90710 Acquired absence of both cervix and uterus: Secondary | ICD-10-CM | POA: Diagnosis not present

## 2016-05-09 LAB — COMPREHENSIVE METABOLIC PANEL
ALT: 17 U/L (ref 14–54)
AST: 20 U/L (ref 15–41)
Albumin: 3.1 g/dL — ABNORMAL LOW (ref 3.5–5.0)
Alkaline Phosphatase: 51 U/L (ref 38–126)
Anion gap: 8 (ref 5–15)
BUN: 16 mg/dL (ref 6–20)
CHLORIDE: 102 mmol/L (ref 101–111)
CO2: 29 mmol/L (ref 22–32)
Calcium: 8.9 mg/dL (ref 8.9–10.3)
Creatinine, Ser: 0.76 mg/dL (ref 0.44–1.00)
Glucose, Bld: 99 mg/dL (ref 65–99)
POTASSIUM: 4.1 mmol/L (ref 3.5–5.1)
SODIUM: 139 mmol/L (ref 135–145)
Total Bilirubin: 1 mg/dL (ref 0.3–1.2)
Total Protein: 6.1 g/dL — ABNORMAL LOW (ref 6.5–8.1)

## 2016-05-09 LAB — CBC
HCT: 33.1 % — ABNORMAL LOW (ref 36.0–46.0)
Hemoglobin: 10.6 g/dL — ABNORMAL LOW (ref 12.0–15.0)
MCH: 29.3 pg (ref 26.0–34.0)
MCHC: 32 g/dL (ref 30.0–36.0)
MCV: 91.4 fL (ref 78.0–100.0)
PLATELETS: 252 10*3/uL (ref 150–400)
RBC: 3.62 MIL/uL — ABNORMAL LOW (ref 3.87–5.11)
RDW: 16 % — AB (ref 11.5–15.5)
WBC: 11.5 10*3/uL — AB (ref 4.0–10.5)

## 2016-05-09 LAB — TROPONIN I: Troponin I: <0.03 ng/mL (ref <0.03)

## 2016-05-09 LAB — STREP PNEUMONIAE URINARY ANTIGEN: STREP PNEUMO URINARY ANTIGEN: NEGATIVE

## 2016-05-09 MED ORDER — SACCHAROMYCES BOULARDII 250 MG PO CAPS
250.0000 mg | ORAL_CAPSULE | Freq: Two times a day (BID) | ORAL | Status: DC
  Administered 2016-05-09 – 2016-05-10 (×3): 250 mg via ORAL
  Filled 2016-05-09 (×3): qty 1

## 2016-05-09 MED ORDER — HYDROCODONE-ACETAMINOPHEN 5-325 MG PO TABS
1.0000 | ORAL_TABLET | Freq: Four times a day (QID) | ORAL | Status: DC | PRN
  Administered 2016-05-09 (×2): 1 via ORAL
  Filled 2016-05-09 (×2): qty 1

## 2016-05-09 MED ORDER — VITAMIN B-12 1000 MCG PO TABS
1000.0000 ug | ORAL_TABLET | Freq: Every day | ORAL | Status: DC
  Administered 2016-05-09 (×2): 1000 ug via ORAL
  Filled 2016-05-09 (×2): qty 1

## 2016-05-09 MED ORDER — ACETAMINOPHEN 325 MG PO TABS: 650.0000 mg | ORAL_TABLET | Freq: Four times a day (QID) | ORAL | Status: DC | PRN

## 2016-05-09 MED ORDER — PANTOPRAZOLE SODIUM 40 MG PO TBEC
40.0000 mg | DELAYED_RELEASE_TABLET | Freq: Every day | ORAL | Status: DC
  Administered 2016-05-09 – 2016-05-10 (×2): 40 mg via ORAL
  Filled 2016-05-09 (×2): qty 1

## 2016-05-09 MED ORDER — TRIAMCINOLONE ACETONIDE 0.5 % EX CREA
TOPICAL_CREAM | Freq: Every day | CUTANEOUS | Status: DC
  Administered 2016-05-09 – 2016-05-10 (×2): via TOPICAL
  Filled 2016-05-09: qty 15

## 2016-05-09 MED ORDER — ASPIRIN EC 81 MG PO TBEC
81.0000 mg | DELAYED_RELEASE_TABLET | Freq: Every day | ORAL | Status: DC
  Administered 2016-05-09: 81 mg via ORAL
  Filled 2016-05-09: qty 1

## 2016-05-09 MED ORDER — VITAMIN B-6 100 MG PO TABS
100.0000 mg | ORAL_TABLET | Freq: Every day | ORAL | Status: DC
  Administered 2016-05-09 (×2): 100 mg via ORAL
  Filled 2016-05-09 (×2): qty 1

## 2016-05-09 MED ORDER — VITAMIN D 1000 UNITS PO TABS
1000.0000 [IU] | ORAL_TABLET | Freq: Every day | ORAL | Status: DC
  Administered 2016-05-09 (×2): 1000 [IU] via ORAL
  Filled 2016-05-09 (×2): qty 1

## 2016-05-09 MED ORDER — CALCIUM CARBONATE-VITAMIN D 500-200 MG-UNIT PO TABS
1.0000 | ORAL_TABLET | Freq: Every day | ORAL | Status: DC
  Administered 2016-05-09 (×2): 1 via ORAL
  Filled 2016-05-09 (×2): qty 1

## 2016-05-09 MED ORDER — ONDANSETRON HCL 4 MG/2ML IJ SOLN
4.0000 mg | Freq: Four times a day (QID) | INTRAMUSCULAR | Status: DC | PRN
  Filled 2016-05-09: qty 2

## 2016-05-09 MED ORDER — ADULT MULTIVITAMIN W/MINERALS CH
1.0000 | ORAL_TABLET | Freq: Every day | ORAL | Status: DC
  Administered 2016-05-09 (×2): 1 via ORAL
  Filled 2016-05-09 (×2): qty 1

## 2016-05-09 MED ORDER — NITROFURANTOIN MONOHYD MACRO 100 MG PO CAPS
100.0000 mg | ORAL_CAPSULE | Freq: Two times a day (BID) | ORAL | Status: AC
  Administered 2016-05-09 (×4): 100 mg via ORAL
  Filled 2016-05-09 (×4): qty 1

## 2016-05-09 NOTE — Care Management Obs Status (Signed)
[place] NOTIFICATION   Patient Details  Name: Kimberly [name] MRN: [id number] Date of Birth: 1945/12/02   Medicare Observation Status Notification Given:       Maryclare Labrador, RN 05/09/2016, 2:23 PM

## 2016-05-09 NOTE — Progress Notes (Addendum)
Patient ID: Kimberly [name], female   DOB: 14-Jun-1945, 71 y.o.   MRN: [id number]                                                                PROGRESS NOTE                                                                                                                                                                                                             Patient Demographics:    Kimberly [name], is a 71 y.o. female, DOB - 1945-03-09, JOI:[id number]  Admit date - 05/08/2016   Admitting Physician Jani Gravel, MD  Outpatient Primary MD for the patient is [name], MD  LOS - 0  Outpatient Specialists:     Chief Complaint  Patient presents with  . Chest Pain  . Shortness of Breath       Brief Narrative  71 y.o. female, w Myasthenia Gravis, Psoriasis, Myositis, Vasculitis who apparently presents w c/o left sided chest pain, pleuritic "I knew it felt like pneumonia" starting 1 day ago and therefore presented to ED for evaluation.  Pt denies fever, chills, cough, palp, sob, n/v, diarrhea.  Pt had CXR that showed small left pleural effusion with mild patchy infiltrate left base.  Pt has abx allergies that made it difficult for the ED to send the patient home.  Wbc 15.1,  Trop negative.  EKG nsr at 80, nl axis, early R progression, no st-t changes c/w ischemia.  Admitted due to MG, as well as inability to ? Tolerate different abx.     Subjective:    Kimberly [name] today still has slight left sided chest discomfort, but much improved.  Slight dyspnea.  Afebrile overnite.  Denies palp, n/v, diarrhea.  No headache,  No abdominal pain, No new weakness tingling or numbness, No Cough.    Assessment  & Plan :    Active Problems:   Essential hypertension   Pleural effusion   Pneumonia   1. CAP tx with rocephin 1gm iv qday, tolerated po doxycycline even though has tetracycline allergy Doxycycline 100mg  po bid Start florastor  2. Cp, atypical ekg negative for ischemic changes, trop  negative Repeat trop I this am negative.   3. Myasthenia gravis Prednisone at 10mg  po qday Methotrexate typically on  Thursdays (Hold for now) due to immunosuppression  4. Pleural effusion, small left Hx of prior pleural thickeing on CTA 03/22/2013, and prior hx of pleural effusion Will obtain a lateral decubitus film in the am. And repeat CXR this am.   5. Edema Cont lasix, spironolactone  6. Hypertension Cont valsartan  DVT Prophylaxis Lovenox - SCDs   Code Status : FULL CODE  Family Communication  : w patient and husband  Disposition Plan  :   home  Barriers For Discharge : due to myasthenia at high risk for decompensation, monitor closely  Consults  :  none  Procedures  :   Lab Results  Component Value Date   PLT 252 05/09/2016    Antibiotics  :   Rocephin 4/28=> Doxycycline 4/28=>  Anti-infectives    Start     Dose/Rate Route Frequency Ordered Stop   05/09/16 1800  cefTRIAXone (ROCEPHIN) 1 g in dextrose 5 % 50 mL IVPB     1 g 100 mL/hr over 30 Minutes Intravenous Every 24 hours 05/08/16 2009     05/08/16 1800  cefTRIAXone (ROCEPHIN) 1 g in dextrose 5 % 50 mL IVPB     1 g 100 mL/hr over 30 Minutes Intravenous  Once 05/08/16 1748 05/08/16 1842   05/08/16 1800  doxycycline (VIBRA-TABS) tablet 100 mg     100 mg Oral  Once 05/08/16 1748 05/08/16 1813        Objective:   Vitals:   05/08/16 1930 05/08/16 1945 05/08/16 2014 05/09/16 0608  BP: (!) 114/57 113/65 (!) 146/48 (!) 132/40  Pulse: 67 68 74 64  Resp: 18 16 18 18   Temp:   98.1 F (36.7 C) 97.9 F (36.6 C)  TempSrc:   Oral Oral  SpO2: 93% 95% 97% 95%  Weight:  123.3 kg (271 lb 14.4 oz)    Height:  5\' 2"  (1.575 m)      Wt Readings from Last 3 Encounters:  05/08/16 123.3 kg (271 lb 14.4 oz)  04/15/16 124.7 kg (275 lb)  05/12/15 117.5 kg (259 lb)     Intake/Output Summary (Last 24 hours) at 05/09/16 1025 Last data filed at 05/09/16 0951  Gross per 24 hour  Intake              270 ml    Output                0 ml  Net              270 ml     Physical Exam  Awake Alert, Oriented X 3, No new F.N deficits, Normal affect [place].AT,PERRAL Supple Neck,No JVD, No cervical lymphadenopathy appriciated.  Symmetrical Chest wall movement, Good air movement bilaterally, slight crackle left base, no wheezing RRR,No Gallops,Rubs or new Murmurs, No Parasternal Heave +ve B.Sounds, Abd Soft, No tenderness, No organomegaly appriciated, No rebound - guarding or rigidity. No Cyanosis, Clubbing or edema,  Slight redness over the right distal lower ext, old per pt, hx of vasculitis, unchanged    Data Review:    CBC  Recent Labs Lab 05/08/16 1549 05/09/16 0556  WBC 15.1* 11.5*  HGB 12.0 10.6*  HCT 38.2 33.1*  PLT 311 252  MCV 90.7 91.4  MCH 28.5 29.3  MCHC 31.4 32.0  RDW 15.8* 16.0*    Chemistries   Recent Labs Lab 05/08/16 1549 05/09/16 0556  NA 138 139  K 3.7 4.1  CL 103 102  CO2 25 29  GLUCOSE 108* 99  BUN 15 16  CREATININE 0.82 0.76  CALCIUM 9.3 8.9  AST  --  20  ALT  --  17  ALKPHOS  --  51  BILITOT  --  1.0   ------------------------------------------------------------------------------------------------------------------ No results for input(s): CHOL, HDL, LDLCALC, TRIG, CHOLHDL, LDLDIRECT in the last 72 hours.  Lab Results  Component Value Date   HGBA1C 5.3 08/09/2014   ------------------------------------------------------------------------------------------------------------------ No results for input(s): TSH, T4TOTAL, T3FREE, THYROIDAB in the last 72 hours.  Invalid input(s): FREET3 ------------------------------------------------------------------------------------------------------------------ No results for input(s): VITAMINB12, FOLATE, FERRITIN, TIBC, IRON, RETICCTPCT in the last 72 hours.  Coagulation profile No results for input(s): INR, PROTIME in the last 168 hours.  No results for input(s): DDIMER in the last 72 hours.  Cardiac  Enzymes  Recent Labs Lab 05/09/16 0556  TROPONINI <0.03   ------------------------------------------------------------------------------------------------------------------ No results found for: BNP  Inpatient Medications  Scheduled Meds: . aspirin EC  81 mg Oral QHS  . calcium-vitamin D  1 tablet Oral QHS  . cholecalciferol  1,000 Units Oral QHS  . enoxaparin (LOVENOX) injection  40 mg Subcutaneous Q24H  . folic acid  2 mg Oral QPM  . furosemide  40 mg Oral Daily  . irbesartan  300 mg Oral Daily  . multivitamin with minerals  1 tablet Oral QHS  . nitrofurantoin (macrocrystal-monohydrate)  100 mg Oral BID  . predniSONE  10 mg Oral Q breakfast  . pyridostigmine  60 mg Oral QID  . pyridOXINE  100 mg Oral QHS  . spironolactone  25 mg Oral Daily  . triamcinolone cream   Topical Daily  . vitamin B-12  1,000 mcg Oral QHS   Continuous Infusions: . cefTRIAXone (ROCEPHIN)  IV     PRN Meds:.acetaminophen, HYDROcodone-acetaminophen  Micro Results No results found for this or any previous visit (from the past 240 hour(s)).  Radiology Reports Dg Chest 2 View  Result Date: 05/08/2016 CLINICAL DATA:  Shortness of breath and chest pain EXAM: CHEST  2 VIEW COMPARISON:  Chest radiograph February 11, 2014 and chest CT February 20, 2015 FINDINGS: There is patchy infiltrate in the posterior left base with small left pleural effusion. Lungs elsewhere clear. Heart size and pulmonary vascularity are normal. No adenopathy. There is postoperative change in the lower cervical spine. There is extensive arthropathy in both shoulders. There appears to be avascular necrosis in both humeral heads, more advanced on the right than on the left. IMPRESSION: Small left pleural effusion with mild patchy infiltrate left base. Lungs elsewhere clear. Stable cardiac silhouette. Arthropathy with avascular necrosis in the shoulder regions. Electronically Signed   By: Lowella Grip [name] M.D.   On: 05/08/2016 16:11     Time Spent in minutes  30   Jani Gravel M.D on 05/09/2016 at 10:25 AM  Between 7am to 7pm - Pager - [phone]  After 7pm go to [email or web address] - password [facility]  [facility] -  Office  [phone]

## 2016-05-09 NOTE — Progress Notes (Signed)
Patient arrived via stretcher from the emergency room, assisted to bed.  Accompanied by spouse, admission documentation complete, room air, pain in left side of chest that patient states, "she has this pain when she has pneumonia or pleural edema".  Patient call bell within reach, assessment complete, generalized edema but right lower leg has 2+ edema and redness that is related to known history of vasculitis.  Patient past medical history:   Arthritis    . Atrial tachycardia ([place]) 05/12/2015  . Diplopia   . Family history of ischemic heart disease   . GERD (gastroesophageal reflux disease)   . Heart murmur   . Myalgia and myositis, unspecified   . Myasthenia gravis without exacerbation (HCC)    [facility], Dr Anna Genre  . NSVT (nonsustained ventricular tachycardia) ([place]) 05/12/2015  . Other and unspecified hyperlipidemia   . Psoriasis    [place] ;Dr Jarome Matin  . Unspecified vitamin D deficiency    Dr Estanislado Pandy  . Vasculitis ([place])    Dr Estanislado Pandy

## 2016-05-09 NOTE — Care Management Obs Status (Signed)
[place] NOTIFICATION   Patient Details  Name: IRENA GAYDOS MRN: [id number] Date of Birth: Jun 04, 1945   Medicare Observation Status Notification Given:  Yes    Maryclare Labrador, RN 05/09/2016, 2:24 PM

## 2016-05-10 ENCOUNTER — Telehealth: Payer: Self-pay | Admitting: *Deleted

## 2016-05-10 DIAGNOSIS — J181 Lobar pneumonia, unspecified organism: Secondary | ICD-10-CM

## 2016-05-10 LAB — CBC
HCT: 33 % — ABNORMAL LOW (ref 36.0–46.0)
HEMOGLOBIN: 10.2 g/dL — AB (ref 12.0–15.0)
MCH: 28.4 pg (ref 26.0–34.0)
MCHC: 30.9 g/dL (ref 30.0–36.0)
MCV: 91.9 fL (ref 78.0–100.0)
PLATELETS: 263 10*3/uL (ref 150–400)
RBC: 3.59 MIL/uL — ABNORMAL LOW (ref 3.87–5.11)
RDW: 15.6 % — ABNORMAL HIGH (ref 11.5–15.5)
WBC: 11.4 10*3/uL — ABNORMAL HIGH (ref 4.0–10.5)

## 2016-05-10 LAB — COMPREHENSIVE METABOLIC PANEL WITH GFR
ALT: 16 U/L (ref 14–54)
AST: 22 U/L (ref 15–41)
Albumin: 3.1 g/dL — ABNORMAL LOW (ref 3.5–5.0)
Alkaline Phosphatase: 51 U/L (ref 38–126)
Anion gap: 7 (ref 5–15)
BUN: 15 mg/dL (ref 6–20)
CO2: 29 mmol/L (ref 22–32)
Calcium: 8.3 mg/dL — ABNORMAL LOW (ref 8.9–10.3)
Chloride: 103 mmol/L (ref 101–111)
Creatinine, Ser: 0.88 mg/dL (ref 0.44–1.00)
GFR calc Af Amer: >60 mL/min
GFR calc non Af Amer: >60 mL/min
Glucose, Bld: 92 mg/dL (ref 65–99)
Potassium: 3.6 mmol/L (ref 3.5–5.1)
Sodium: 139 mmol/L (ref 135–145)
Total Bilirubin: 0.8 mg/dL (ref 0.3–1.2)
Total Protein: 5.9 g/dL — ABNORMAL LOW (ref 6.5–8.1)

## 2016-05-10 LAB — LEGIONELLA PNEUMOPHILA SEROGP 1 UR AG: L. pneumophila Serogp 1 Ur Ag: NEGATIVE

## 2016-05-10 MED ORDER — AMOXICILLIN-POT CLAVULANATE 875-125 MG PO TABS
1.0000 | ORAL_TABLET | Freq: Two times a day (BID) | ORAL | Status: DC
  Administered 2016-05-10: 1 via ORAL
  Filled 2016-05-10: qty 1

## 2016-05-10 MED ORDER — AMOXICILLIN-POT CLAVULANATE 875-125 MG PO TABS: 1.0000 | ORAL_TABLET | Freq: Two times a day (BID) | ORAL | 0 refills | Status: DC

## 2016-05-10 NOTE — Consult Note (Signed)
           [facility] CM Primary Care Navigator  05/10/2016  Kimberly [name] 1945-10-11 [id number]   Went to see patient in the room earlier to identify possible discharge needs but she was alreadydischarged.  Patient was discharged home today per staff.  Attempted to call primary care provider's office to notify of patient's discharge and need for post hospital follow-up and transition of care but noted that transition of care call (TCM) had been done by primary care provider's office (L. Brand).   For questions, please contact:  Dannielle Huh, BSN, RN- [facility] Primary Care Navigator  Telephone: [phone] [place]

## 2016-05-10 NOTE — Telephone Encounter (Signed)
Transition Care Management Follow-up Telephone Call   Date discharged? 05/10/16   How have you been since you were released from the hospital? Pt states she is doing ok   Do you understand why you were in the hospital? YES   Do you understand the discharge instructions? YES   Where were you discharged to? Home   Items Reviewed:  Medications reviewed: YES  Allergies reviewed: YES  Dietary changes reviewed: YES, Heart healthy  Referrals reviewed: YES, pulmonary appt still pending   Functional Questionnaire:   Activities of Daily Living (ADLs):   She states she are independent in the following: ambulation, bathing and hygiene, feeding, continence, grooming, toileting and dressing States she doesn't require assistance   Any transportation issues/concerns?: NO   Any patient concerns? NO   Confirmed importance and date/time of follow-up visits scheduled YES, appt 05/21/16  Provider Appointment booked with Dr. Quay Burow  Confirmed with patient if condition begins to worsen call PCP or go to the ER.  Patient was given the office number and encouraged to call back with question or concerns.  : YES

## 2016-05-10 NOTE — Progress Notes (Signed)
Kimberly [name] to be D/C'd Home per MD order.  Discussed with the patient and all questions fully answered.  VSS, Skin clean, dry and intact without evidence of skin break down, no evidence of skin tears noted. IV catheter discontinued intact. Site without signs and symptoms of complications. Dressing and pressure applied.  An After Visit Summary was printed and given to the patient. Patient received prescription.  D/c education completed with patient/family including follow up instructions, medication list, d/c activities limitations if indicated, with other d/c instructions as indicated by MD - patient able to verbalize understanding, all questions fully answered.   Patient instructed to return to ED, call 911, or call MD for any changes in condition.   Patient escorted via [place], and D/C home via private auto.  Christoper Fabian [name] 05/10/2016 1:18 PM

## 2016-05-10 NOTE — Telephone Encounter (Signed)
Last visit 04/15/16 She is currently in the hospital so she can not use the MTX  Labs yesterday, CMP normal WBC elevated (she has Pneumonia) Next visit 09/16/16 Ok to refill per Dr Estanislado Pandy (patient aware not to use while she is sick, and while she is on ABX

## 2016-05-10 NOTE — Discharge Instructions (Addendum)
Community-Acquired Pneumonia, Adult °Pneumonia is an infection of the lungs. One type of pneumonia can happen while a person is in a hospital. A different type can happen when a person is not in a hospital (community-acquired pneumonia). It is easy for this kind to spread from person to person. It can spread to you if you breathe near an infected person who coughs or sneezes. Some symptoms include: °· A dry cough. °· A wet (productive) cough. °· Fever. °· Sweating. °· Chest pain. °Follow these instructions at home: °· Take over-the-counter and prescription medicines only as told by your doctor. °¨ Only take cough medicine if you are losing sleep. °¨ If you were prescribed an antibiotic medicine, take it as told by your doctor. Do not stop taking the antibiotic even if you start to feel better. °· Sleep with your head and neck raised (elevated). You can do this by putting a few pillows under your head, or you can sleep in a recliner. °· Do not use tobacco products. These include cigarettes, chewing tobacco, and e-cigarettes. If you need help quitting, ask your doctor. °· Drink enough water to keep your pee (urine) clear or pale yellow. °A shot (vaccine) can help prevent pneumonia. Shots are often suggested for: °· People older than 70 years of age. °· People older than 70 years of age: °¨ Who are having cancer treatment. °¨ Who have long-term (chronic) lung disease. °¨ Who have problems with their body's defense system (immune system). °You may also prevent pneumonia if you take these actions: °· Get the flu (influenza) shot every year. °· Go to the dentist as often as told. °· Wash your hands often. If soap and water are not available, use hand sanitizer. °Contact a doctor if: °· You have a fever. °· You lose sleep because your cough medicine does not help. °Get help right away if: °· You are short of breath and it gets worse. °· You have more chest pain. °· Your sickness gets worse. This is very serious if: °¨ You  are an older adult. °¨ Your body's defense system is weak. °· You cough up blood. °This information is not intended to replace advice given to you by your health care provider. Make sure you discuss any questions you have with your health care provider. °Document Released: 06/16/2007 Document Revised: 06/05/2015 Document Reviewed: 04/24/2014 °Elsevier Interactive Patient Education © 2017 [employer] ° °

## 2016-05-10 NOTE — Discharge Summary (Signed)
Physician Discharge Summary  Kimberly [name] MHD:[id number] DOB: 02-26-1945 DOA: 05/08/2016  PCP: [name], MD  Admit date: 05/08/2016 Discharge date: 05/10/2016  Admitted From:  Home Disposition:  Home   Recommendations for Outpatient Follow-up:  1. Follow up with PCP in 1-2 weeks 2. Follow up with Pulmonary as an outpatient.  Home Health: No Equipment/Devices: None  Discharge Condition: Stable CODE STATUS: Full Diet recommendation: Heart Healthy   Brief/Interim Summary:  71 y.o.female,w Myasthenia Gravis, Psoriasis, Myositis, Vasculitis presented with left sided chest pain. She was found to have pneumonia and small left pleural effusion. She has much improved on iv Rocephin. Cultures negative. Leukocytosis improving and she is on room air. She will be discharged on 5 more days of oral Augmentin.   Discharge Diagnoses:  Active Problems:   Essential hypertension   Pleural effusion   Pneumonia  1. CAP - on iv rocephin currently; cultures negative; no temp spike; improving WBCs ; Discharge on oral Augmentin for 5 more days. Follow up with Pulmonary as an outpatient.  2. Cp, atypical: improved ekg negative for ischemic changes, trop negative  3. Myasthenia gravis Continue home dose of Prednisone and Methotrexate.  4. Pleural effusion, small left: repeat cxr stable; no need of thoracentesis; outpatient followup.   5. Edema Cont lasix, spironolactone  6. Hypertension Cont valsartan  7. Leukocytosis: improving  8. History of recurrent UTIs: stop Macrobid. Outpatient follow up.     Discharge Instructions  Discharge Instructions    Call MD for:  difficulty breathing, headache or visual disturbances    Complete by:  As directed    Call MD for:  hives    Complete by:  As directed    Call MD for:  persistant dizziness or light-headedness    Complete by:  As directed    Call MD for:  persistant nausea and vomiting    Complete by:  As directed    Call MD  for:  redness, tenderness, or signs of infection (pain, swelling, redness, odor or green/yellow discharge around incision site)    Complete by:  As directed    Call MD for:  severe uncontrolled pain    Complete by:  As directed    Call MD for:  temperature >100.4    Complete by:  As directed    Diet - low sodium heart healthy    Complete by:  As directed    Increase activity slowly    Complete by:  As directed      Allergies as of 05/10/2016      Reactions   Azithromycin Other (See Comments)   Angioedema   Levaquin [levofloxacin In D5w] Other (See Comments)   D/T MYASTHENIA GRAVIS   Tetracycline Rash   Sulfonamide Derivatives    Seizure age 37 (Note: probably fever induced)   Aleve [naproxen] Other (See Comments)   Caused excessive salivation   Amlodipine Besy-benazepril Hcl Cough      Medication List    STOP taking these medications   nitrofurantoin (macrocrystal-monohydrate) 100 MG capsule Commonly known as:  MACROBID     TAKE these medications   amoxicillin-clavulanate 875-125 MG tablet Commonly known as:  AUGMENTIN Take 1 tablet by mouth every 12 (twelve) hours.   aspirin EC 81 MG tablet Take 81 mg by mouth at bedtime.   augmented betamethasone dipropionate 0.05 % ointment Commonly known as:  DIPROLENE-AF Apply 1 application topically daily. For vasculitis and psoriasis   CALCIUM 600-D PO Take 600 mg by mouth at  bedtime.   cholecalciferol 1000 units tablet Commonly known as:  VITAMIN D Take 1,000 Units by mouth at bedtime.   folic acid 1 MG tablet Commonly known as:  FOLVITE TAKE 2 TABLETS BY MOUTH ONCE DAILY What changed:  See the new instructions.   furosemide 40 MG tablet Commonly known as:  LASIX Take 1 tablet (40 mg total) by mouth daily.   methotrexate 2.5 MG tablet Commonly known as:  RHEUMATREX TAKE 8 TABLETS EVERY WEEK What changed:  See the new instructions.   multivitamin with minerals Tabs tablet Take 1 tablet by mouth at bedtime.    predniSONE 5 MG tablet Commonly known as:  DELTASONE TAKE 1 TABLET BY MOUTH EVERY DAY WITH BREAKFAST AND WITH 2,1MG  PREDNISONE What changed:  See the new instructions.   predniSONE 1 MG tablet Commonly known as:  DELTASONE Take 2 tablets (2 mg total) by mouth daily with breakfast. Take with prednisone 5 mg What changed:  additional instructions  Another medication with the same name was removed. Continue taking this medication, and follow the directions you see here.   pyridostigmine 60 MG tablet Commonly known as:  MESTINON Take 1 tablet (60 mg total) by mouth 4 (four) times daily. What changed:  when to take this   pyridOXINE 100 MG tablet Commonly known as:  VITAMIN B-6 Take 100 mg by mouth at bedtime.   spironolactone 25 MG tablet Commonly known as:  ALDACTONE Take 1 tablet (25 mg total) by mouth daily.   valsartan 320 MG tablet Commonly known as:  DIOVAN Take 1 tablet (320 mg total) by mouth daily. --- Office visit needed for further refills What changed:  Another medication with the same name was removed. Continue taking this medication, and follow the directions you see here.   VITAMIN B-12 PO Take 1 tablet by mouth at bedtime.      Follow-up Information    [name], MD Follow up in 2 week(s).   Specialty:  Internal Medicine Contact information: [place] [address] [phone]          Allergies  Allergen Reactions  . Azithromycin Other (See Comments)    Angioedema  . Levaquin [Levofloxacin In D5w] Other (See Comments)    D/T MYASTHENIA GRAVIS  . Tetracycline Rash  . Sulfonamide Derivatives     Seizure age 63 (Note: probably fever induced)  . Aleve [Naproxen] Other (See Comments)    Caused excessive salivation  . Amlodipine Besy-Benazepril Hcl Cough    Consultations:  None   Procedures/Studies: Dg Chest 2 View  Result Date: 05/09/2016 CLINICAL DATA:  Patient reports she was diagnosed with pneumonia on Friday, reports  she has had pneumonia a few times in the last couple of years. Reports she thinks the doctors may be looking for fluid in her left lung. EXAM: CHEST  2 VIEW COMPARISON:  05/08/2016 FINDINGS: Opacity at the left lung base representing a small pleural effusion with either atelectasis or infiltrate, is stable when compared to the prior exam. Remainder of the lungs is clear. No right pleural effusion. No pneumothorax. Cardiac silhouette is normal in size and configuration. No mediastinal or hilar masses. No convincing adenopathy. Skeletal structures are demineralized. There are advanced arthropathic changes of both shoulders. IMPRESSION: 1. No change from the previous day's study. 2. Small left pleural effusion with associated left lower lobe atelectasis or pneumonia. Atelectasis is favored. Electronically Signed   By: Lajean Manes M.D.   On: 05/09/2016 11:47  Dg Chest 2 View  Result Date: 05/08/2016 CLINICAL DATA:  Shortness of breath and chest pain EXAM: CHEST  2 VIEW COMPARISON:  Chest radiograph February 11, 2014 and chest CT February 20, 2015 FINDINGS: There is patchy infiltrate in the posterior left base with small left pleural effusion. Lungs elsewhere clear. Heart size and pulmonary vascularity are normal. No adenopathy. There is postoperative change in the lower cervical spine. There is extensive arthropathy in both shoulders. There appears to be avascular necrosis in both humeral heads, more advanced on the right than on the left. IMPRESSION: Small left pleural effusion with mild patchy infiltrate left base. Lungs elsewhere clear. Stable cardiac silhouette. Arthropathy with avascular necrosis in the shoulder regions. Electronically Signed   By: Lowella Grip [name] M.D.   On: 05/08/2016 16:11   Dg Chest Left Decubitus  Result Date: 05/09/2016 CLINICAL DATA:  Pneumonia.  Left pleural effusion. EXAM: CHEST - LEFT DECUBITUS COMPARISON:  05/08/2016 FINDINGS: Left lateral decubitus view shows a small free  layering left pleural effusion. IMPRESSION: Small freely-layering left pleural effusion. Electronically Signed   By: Earle Gell M.D.   On: 05/09/2016 11:48       Subjective: Patient seen and examined at bedside. She feels weak but feels much better. Mild nausea overnight. No fever, vomiting, chest pain.  Discharge Exam: Vitals:   05/09/16 2132 05/10/16 0457  BP: (!) 126/46 (!) 132/49  Pulse: 81 80  Resp: 18 18  Temp: 99.3 F (37.4 C) 99.9 F (37.7 C)   Vitals:   05/09/16 0608 05/09/16 1508 05/09/16 2132 05/10/16 0457  BP: (!) 132/40 (!) 105/41 (!) 126/46 (!) 132/49  Pulse: 64 66 81 80  Resp: 18 18 18 18   Temp: 97.9 F (36.6 C) 98 F (36.7 C) 99.3 F (37.4 C) 99.9 F (37.7 C)  TempSrc: Oral Oral Oral Oral  SpO2: 95% 96% 91% 94%  Weight:      Height:        General: Pt is alert, awake, not in acute distress Cardiovascular: RRR, S1/S2 +, no rubs, no gallops Respiratory: CTA bilaterally, no wheezing, no rhonchi Abdominal: Soft, NT, ND, bowel sounds + Extremities: no edema, no cyanosis    The results of significant diagnostics from this hospitalization (including imaging, microbiology, ancillary and laboratory) are listed below for reference.     Microbiology: Recent Results (from the past 240 hour(s))  Blood culture (routine x 2)     Status: None (Preliminary result)   Collection Time: 05/08/16  6:07 PM  Result Value Ref Range Status   Specimen Description BLOOD RIGHT WRIST  Final   Special Requests   Final    BOTTLES DRAWN AEROBIC AND ANAEROBIC Blood Culture adequate volume   Culture NO GROWTH < 24 HOURS  Final   Report Status PENDING  Incomplete  Blood culture (routine x 2)     Status: None (Preliminary result)   Collection Time: 05/08/16  6:09 PM  Result Value Ref Range Status   Specimen Description BLOOD LEFT ANTECUBITAL  Final   Special Requests IN PEDIATRIC BOTTLE Blood Culture adequate volume  Final   Culture NO GROWTH < 24 HOURS  Final   Report  Status PENDING  Incomplete     Labs: BNP (last 3 results) No results for input(s): BNP in the last 8760 hours. Basic Metabolic Panel:  Recent Labs Lab 05/08/16 1549 05/09/16 0556 05/10/16 0432  NA 138 139 139  K 3.7 4.1 3.6  CL 103 102 103  CO2 25  29 29  GLUCOSE 108* 99 92  BUN 15 16 15   CREATININE 0.82 0.76 0.88  CALCIUM 9.3 8.9 8.3*   Liver Function Tests:  Recent Labs Lab 05/09/16 0556 05/10/16 0432  AST 20 22  ALT 17 16  ALKPHOS 51 51  BILITOT 1.0 0.8  PROT 6.1* 5.9*  ALBUMIN 3.1* 3.1*   No results for input(s): LIPASE, AMYLASE in the last 168 hours. No results for input(s): AMMONIA in the last 168 hours. CBC:  Recent Labs Lab 05/08/16 1549 05/09/16 0556 05/10/16 0432  WBC 15.1* 11.5* 11.4*  HGB 12.0 10.6* 10.2*  HCT 38.2 33.1* 33.0*  MCV 90.7 91.4 91.9  PLT 311 252 263   Cardiac Enzymes:  Recent Labs Lab 05/09/16 0556  TROPONINI <0.03   BNP: Invalid input(s): POCBNP CBG: No results for input(s): GLUCAP in the last 168 hours. D-Dimer No results for input(s): DDIMER in the last 72 hours. Hgb A1c No results for input(s): HGBA1C in the last 72 hours. Lipid Profile No results for input(s): CHOL, HDL, LDLCALC, TRIG, CHOLHDL, LDLDIRECT in the last 72 hours. Thyroid function studies No results for input(s): TSH, T4TOTAL, T3FREE, THYROIDAB in the last 72 hours.  Invalid input(s): FREET3 Anemia work up No results for input(s): VITAMINB12, FOLATE, FERRITIN, TIBC, IRON, RETICCTPCT in the last 72 hours. Urinalysis    Component Value Date/Time   COLORURINE YELLOW 04/20/2016 0814   APPEARANCEUR Cloudy (A) 04/20/2016 0814   LABSPEC >=1.030 (A) 04/20/2016 0814   PHURINE 5.5 04/20/2016 0814   GLUCOSEU NEGATIVE 04/20/2016 0814   HGBUR LARGE (A) 04/20/2016 0814   BILIRUBINUR NEGATIVE 04/20/2016 0814   BILIRUBINUR neg 05/09/2015 0851   KETONESUR NEGATIVE 04/20/2016 0814   PROTEINUR NEGATIVE 04/15/2016 1528   UROBILINOGEN 0.2 04/20/2016 0814    NITRITE POSITIVE (A) 04/20/2016 0814   LEUKOCYTESUR SMALL (A) 04/20/2016 0814   Sepsis Labs Invalid input(s): PROCALCITONIN,  WBC,  LACTICIDVEN Microbiology Recent Results (from the past 240 hour(s))  Blood culture (routine x 2)     Status: None (Preliminary result)   Collection Time: 05/08/16  6:07 PM  Result Value Ref Range Status   Specimen Description BLOOD RIGHT WRIST  Final   Special Requests   Final    BOTTLES DRAWN AEROBIC AND ANAEROBIC Blood Culture adequate volume   Culture NO GROWTH < 24 HOURS  Final   Report Status PENDING  Incomplete  Blood culture (routine x 2)     Status: None (Preliminary result)   Collection Time: 05/08/16  6:09 PM  Result Value Ref Range Status   Specimen Description BLOOD LEFT ANTECUBITAL  Final   Special Requests IN PEDIATRIC BOTTLE Blood Culture adequate volume  Final   Culture NO GROWTH < 24 HOURS  Final   Report Status PENDING  Incomplete     Time coordinating discharge: Over 35 minutes  SIGNED:   Aline August, MD  [facility] 05/10/2016, 9:58 AM Pager  [phone]  If 7PM-7AM, please contact night-coverage [email or web address] Password [email or web address]

## 2016-05-13 LAB — CULTURE, BLOOD (ROUTINE X 2)
CULTURE: NO GROWTH
CULTURE: NO GROWTH
SPECIAL REQUESTS: ADEQUATE
Special Requests: ADEQUATE

## 2016-05-20 NOTE — Progress Notes (Signed)
Subjective:    Patient ID: Kimberly [name], female    DOB: 1945-12-01, 71 y.o.   MRN: [id number]  HPI The patient is here for follow up from the hospital.  Admitted 05/08/16 - 05/10/16 for Pneumonia.   She presented to the ED for left sided pleuritic chest pain that she stated felt like pneumonia.  The pain started 1 day prior to going to the ED .  She denied fever/chills, cough, sob, palpitations.  A CXR showed small left pleural effusion with mild patchy infiltrate in the left base.  WBC was elevated at 15.1, troponin negative.  EKG was Sinus rhythm with no changes consistent with ischemia.  She was started on IV Rocephin.  Cultures were negative.  WBC improved and she did not require oxygen.  She was discharged on Augmentin for 5 additional days, which she completed.   Her myasthenia gravis, hypertension and edema was stable in the hospital.     Her pleuritic chest pain decreased from an 8 to a 2, but she still has some pain. She does cough at times, and it has decreased. It remains a dry cough.  If she is more active or talks she does cough.  She is SOB with exertion and it is a little better.  She denies wheezing.  She is taking her pulse ox and her oxygen level goes down with exertion - 89-90%.  Denies fever.  She is always cold - this is not new, but is worse.  She is eating comfort foods.   Hypertension: She is taking her medication daily. She is compliant with a low sodium diet.     ? Urine infection:  She thinks she is getting a bladder infection.  She has cloudy urine.  She states dysuria.  She denies hematuria. She did have one when she went into the hospital.  Leg edema:  She has increased leg edema.  She thinks it has improved since being home.  She is compliant with a low sodium diet.  She is trying to walk at home.  She sometimes elevates her legs.  She is taking both water pills daily.    Medications and allergies reviewed with patient and updated if  appropriate.  Patient Active Problem List   Diagnosis Date Noted  . Psoriasis 04/13/2016  . History of diverticulitis 04/13/2016  . Osteopenia of multiple sites 04/13/2016  . DJD (degenerative joint disease), cervical 04/07/2016  . Primary osteoarthritis of both knees 04/07/2016  . Decreased abduction of right shoulder joint 04/07/2016  . Atrial tachycardia ([place]) 05/12/2015  . NSVT (nonsustained ventricular tachycardia) ([place]) 05/12/2015  . Abnormal urine odor 05/10/2015  . Abnormal nuclear stress test - INTERMEDIATE RISK 04/14/2015  . Coronary artery calcification seen on CAT scan 02/28/2015  . Fever, unspecified 02/17/2015  . Left lower quadrant pain 02/17/2015  . Hyperglycemia 08/09/2014  . Community acquired pneumonia 03/29/2013  . Dyspnea 03/20/2013  . Hypokalemia 12/31/2012  . Umbilical hernia 34/19/3790  . Pleural effusion 05/10/2012  . Myasthenia gravis without exacerbation ([place]) 02/12/2010  . DIPLOPIA 11/21/2009  . DISTURBANCE OF SKIN SENSATION 11/21/2009  . Vitamin D deficiency 06/25/2008  . Hyperlipidemia 06/25/2008  . Essential hypertension 06/25/2008  . Leukocytoclastic vasculitis ([place]) 06/19/2007  . MUSCLE WEAKNESS (GENERALIZED) 02/10/2007  . ELEVATED SEDIMENTATION RATE 02/10/2007  . Unspecified Myalgia and Myositis 01/24/2007  . EDEMA- LOCALIZED 01/24/2007  . ANA POSITIVE, HX OF 01/24/2007    Current Outpatient Prescriptions on File Prior to Visit  Medication Sig Dispense  Refill  . aspirin EC 81 MG tablet Take 81 mg by mouth at bedtime.    Marland Kitchen augmented betamethasone dipropionate (DIPROLENE-AF) 0.05 % ointment Apply 1 application topically daily. For vasculitis and psoriasis    . Calcium Carb-Cholecalciferol (CALCIUM 600-D PO) Take 600 mg by mouth at bedtime.    . cholecalciferol (VITAMIN D) 1000 units tablet Take 1,000 Units by mouth at bedtime.    . Cyanocobalamin (VITAMIN B-12 PO) Take 1 tablet by mouth at bedtime.    . folic acid (FOLVITE) 1 MG tablet TAKE  2 TABLETS BY MOUTH ONCE DAILY (Patient taking differently: TAKE 2 TABLETS BY MOUTH ONCE DAILY AT BEDTIME) 180 tablet 3  . furosemide (LASIX) 40 MG tablet Take 1 tablet (40 mg total) by mouth daily. 90 tablet 1  . methotrexate (RHEUMATREX) 2.5 MG tablet TAKE 8 TABLETS EVERY WEEK 96 tablet 0  . Multiple Vitamin (MULTIVITAMIN WITH MINERALS) TABS tablet Take 1 tablet by mouth at bedtime.    . predniSONE (DELTASONE) 1 MG tablet Take 2 tablets (2 mg total) by mouth daily with breakfast. Take with prednisone 5 mg (Patient taking differently: Take 2 mg by mouth daily with breakfast. Take with a 5 mg tablet for a 7 mg dose) 180 tablet 0  . predniSONE (DELTASONE) 5 MG tablet TAKE 1 TABLET BY MOUTH EVERY DAY WITH BREAKFAST AND WITH 2,1MG  PREDNISONE (Patient taking differently: TAKE 1 TABLET BY MOUTH EVERY DAY WITH BREAKFAST WITH 2 1 MG TABLETS FOR A 7 MG DOSE) 90 tablet 0  . pyridostigmine (MESTINON) 60 MG tablet Take 1 tablet (60 mg total) by mouth 4 (four) times daily. (Patient taking differently: Take 60 mg by mouth 3 (three) times daily. ) 370 tablet 3  . pyridOXINE (VITAMIN B-6) 100 MG tablet Take 100 mg by mouth at bedtime.    Marland Kitchen spironolactone (ALDACTONE) 25 MG tablet Take 1 tablet (25 mg total) by mouth daily. 90 tablet 0  . valsartan (DIOVAN) 320 MG tablet Take 1 tablet (320 mg total) by mouth daily. --- Office visit needed for further refills 90 tablet 0   No current facility-administered medications on file prior to visit.     Past Medical History:  Diagnosis Date  . Arthritis   . Atrial tachycardia ([place]) 05/12/2015  . Diplopia   . Family history of ischemic heart disease   . GERD (gastroesophageal reflux disease)   . Heart murmur   . Myalgia and myositis, unspecified   . Myasthenia gravis without exacerbation ([facility])    [facility], Dr Anna Genre  . NSVT (nonsustained ventricular tachycardia) ([place]) 05/12/2015  . Other and unspecified hyperlipidemia   . Psoriasis    plantar ;Dr Jarome Matin  . Unspecified  vitamin D deficiency    Dr Estanislado Pandy  . Vasculitis ([place])    Dr Estanislado Pandy    Past Surgical History:  Procedure Laterality Date  . APPENDECTOMY     & exploratory for inflammation; Dr Hassell Done  . CARDIAC CATHETERIZATION N/A 04/17/2015   Procedure: Left Heart Cath and Coronary Angiography;  Surgeon: Leonie Man, MD;  Location: [facility];  Service: Cardiovascular;  Laterality: N/A;  . CERVICAL FUSION     C5-6; Dr Vertell Limber  . CHOLECYSTECTOMY     for stones  . LAMINECTOMY  2010   T1-2; Dr Vertell Limber  . SHOULDER SURGERY     Left & Right  . THORACENTESIS     for peripneumonic effusion  . TOTAL ABDOMINAL HYSTERECTOMY W/ BILATERAL SALPINGOOPHORECTOMY     For  Fibroids & endometriosis    Social History   Social History  . Marital status: Married    Spouse name: N/A  . Number of children: N/A  . Years of education: N/A   Occupational History  . 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 Smoking status: Never Smoker  . Smokeless tobacco: Never Used  . Alcohol use No  . Drug use: No  . Sexual activity: Not on file   Other Topics Concern  . Not on file   Social History Narrative   Lives with husband in a one story home.  Had one child that only lived for 14 hours.     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          Epworth Sleepiness Scale = 2 (as of 03/14/2015)    Family History  Problem Relation Age of Onset  . Heart attack Mother 24  . Heart failure Mother   . Stroke Paternal Grandmother        in 60s  . Lung cancer Father   . Cancer Father        lung  . Hypertension Sister   . Hyperthyroidism Sister        [place]  . Hyperthyroidism Sister        [place]  . Diabetes Neg Hx     Review of Systems  Constitutional: Positive for appetite change (decreased) and fatigue. Negative for chills and fever.  Respiratory: Positive for cough (better, dry) and shortness of breath (with exertion). Negative for  wheezing.   Cardiovascular: Positive for chest pain (pleurtic) and leg swelling. Negative for palpitations.  Gastrointestinal: Positive for abdominal pain (suprapubic pain) and diarrhea (improving).  Endocrine: Positive for cold intolerance.  Genitourinary: Positive for dysuria. Negative for hematuria.       Urine cloudy  Neurological: Negative for light-headedness and headaches.       Objective:   Vitals:   05/21/16 1304  BP: 132/86  Pulse: 72  Resp: 18  Temp: 98.3 F (36.8 C)   Wt Readings from Last 3 Encounters:  05/21/16 275 lb (124.7 kg)  05/08/16 271 lb 14.4 oz (123.3 kg)  04/15/16 275 lb (124.7 kg)   Body mass index is 50.3 kg/m.   Physical Exam    Constitutional: Appears well-developed and well-nourished. No distress.  HENT:  Head: Normocephalic and atraumatic.  Neck: Neck supple. No tracheal deviation present. No thyromegaly present.  No cervical lymphadenopathy Cardiovascular: Normal rate, regular rhythm and normal heart sounds.   No murmur heard. No carotid bruit .  1+ b/l LE pitting edema with mild erythema - not warm or tender  Pulmonary/Chest: Effort normal and breath sounds normal. No respiratory distress. No has no wheezes. No rales.  Skin: Skin is warm and dry. Not diaphoretic.  Psychiatric: Normal mood and affect. Behavior is normal.     CXR 05/08/16:  IMPRESSION: Small left pleural effusion with mild patchy infiltrate left base. Lungs elsewhere clear. Stable cardiac silhouette. Arthropathy with avascular necrosis in the shoulder regions.  CXR 05/09/16:  IMPRESSION: 1. No change from the previous day's study. 2. Small left pleural effusion with associated left lower lobe atelectasis or pneumonia. Atelectasis is favored.  CXR left Decubitus:  IMPRESSION: Small freely-layering left pleural effusion.   Assessment & Plan:    See Problem List for Assessment and Plan of chronic medical problems.

## 2016-05-21 ENCOUNTER — Encounter: Payer: Self-pay | Admitting: Internal Medicine

## 2016-05-21 ENCOUNTER — Other Ambulatory Visit (INDEPENDENT_AMBULATORY_CARE_PROVIDER_SITE_OTHER): Payer: Medicare Other

## 2016-05-21 ENCOUNTER — Ambulatory Visit (INDEPENDENT_AMBULATORY_CARE_PROVIDER_SITE_OTHER)
Admission: RE | Admit: 2016-05-21 | Discharge: 2016-05-21 | Disposition: A | Discharge status: Home / Self Care | Payer: Medicare Other | Source: Ambulatory Visit | Attending: Internal Medicine | Admitting: Internal Medicine

## 2016-05-21 ENCOUNTER — Ambulatory Visit: Payer: Medicare Other | Admitting: Internal Medicine

## 2016-05-21 ENCOUNTER — Ambulatory Visit (INDEPENDENT_AMBULATORY_CARE_PROVIDER_SITE_OTHER): Payer: Medicare Other | Admitting: Internal Medicine

## 2016-05-21 VITALS — BP 132/86 | HR 72 | Temp 98.3°F | Resp 18 | Wt 275.0 lb

## 2016-05-21 DIAGNOSIS — J189 Pneumonia, unspecified organism: Secondary | ICD-10-CM

## 2016-05-21 DIAGNOSIS — R300 Dysuria: Secondary | ICD-10-CM

## 2016-05-21 DIAGNOSIS — I10 Essential (primary) hypertension: Secondary | ICD-10-CM

## 2016-05-21 DIAGNOSIS — R600 Localized edema: Secondary | ICD-10-CM

## 2016-05-21 DIAGNOSIS — J181 Lobar pneumonia, unspecified organism: Secondary | ICD-10-CM

## 2016-05-21 DIAGNOSIS — J90 Pleural effusion, not elsewhere classified: Secondary | ICD-10-CM

## 2016-05-21 DIAGNOSIS — R05 Cough: Secondary | ICD-10-CM | POA: Diagnosis not present

## 2016-05-21 LAB — URINALYSIS, ROUTINE W REFLEX MICROSCOPIC
BILIRUBIN URINE: NEGATIVE
Ketones, ur: NEGATIVE
NITRITE: NEGATIVE
PH: 6 (ref 5.0–8.0)
Specific Gravity, Urine: 1.02 (ref 1.000–1.030)
TOTAL PROTEIN, URINE-UPE24: 30 — AB
Urine Glucose: NEGATIVE
Urobilinogen, UA: 0.2 (ref 0.0–1.0)

## 2016-05-21 MED ORDER — VALSARTAN 320 MG PO TABS: 320.0000 mg | ORAL_TABLET | Freq: Every day | ORAL | 0 refills | Status: DC

## 2016-05-21 MED ORDER — FUROSEMIDE 40 MG PO TABS: 40.0000 mg | ORAL_TABLET | Freq: Every day | ORAL | 1 refills | Status: DC

## 2016-05-21 MED ORDER — NITROFURANTOIN MONOHYD MACRO 100 MG PO CAPS: 100.0000 mg | ORAL_CAPSULE | Freq: Two times a day (BID) | ORAL | 0 refills | Status: DC

## 2016-05-21 NOTE — Assessment & Plan Note (Signed)
BP well controlled Current regimen effective and well tolerated Continue current medications at current doses  

## 2016-05-21 NOTE — Assessment & Plan Note (Signed)
Follow up cxr today

## 2016-05-21 NOTE — Assessment & Plan Note (Signed)
UA, UCx Start empiric nitrofurantoin

## 2016-05-21 NOTE — Assessment & Plan Note (Signed)
b/l legs - worse since hospital Continue current medications Low sodium diet Elevate legs If no improvement may need extra lasix - she will let me know

## 2016-05-21 NOTE — Assessment & Plan Note (Addendum)
Completed antibiotics - feeling better but still weak, no appetite Check cxr today Some of her symptoms may be from possible UTI She will let me know if her symptoms do not improve with treatment of UTI Continue to monitor oxygen Continue to be active

## 2016-05-21 NOTE — Patient Instructions (Addendum)
Have a chest xray today.  We will call you with the results  Give a urine sample.  Start nitrofurantoin - we will call you with the result.    Continue your other medications.

## 2016-05-22 ENCOUNTER — Other Ambulatory Visit: Payer: Self-pay | Admitting: Rheumatology

## 2016-05-24 LAB — URINE CULTURE

## 2016-05-24 NOTE — Telephone Encounter (Signed)
Last Visit: 04/15/16 Next Visit: 09/16/16  Okay to refill Prednisone?

## 2016-05-24 NOTE — Telephone Encounter (Signed)
ok 

## 2016-05-25 ENCOUNTER — Other Ambulatory Visit: Payer: Self-pay | Admitting: Rheumatology

## 2016-05-25 NOTE — Telephone Encounter (Signed)
Last Visit: 04/15/16 Next Visit: 09/16/16  Okay to refill Folic Acid?

## 2016-05-25 NOTE — Telephone Encounter (Signed)
ok 

## 2016-06-15 ENCOUNTER — Other Ambulatory Visit: Payer: Self-pay | Admitting: Internal Medicine

## 2016-06-15 DIAGNOSIS — I10 Essential (primary) hypertension: Secondary | ICD-10-CM

## 2016-06-18 ENCOUNTER — Other Ambulatory Visit: Payer: Self-pay | Admitting: Internal Medicine

## 2016-06-18 ENCOUNTER — Other Ambulatory Visit (INDEPENDENT_AMBULATORY_CARE_PROVIDER_SITE_OTHER): Payer: Medicare Other

## 2016-06-18 ENCOUNTER — Telehealth: Payer: Self-pay | Admitting: Internal Medicine

## 2016-06-18 DIAGNOSIS — R319 Hematuria, unspecified: Secondary | ICD-10-CM

## 2016-06-18 DIAGNOSIS — R0600 Dyspnea, unspecified: Secondary | ICD-10-CM

## 2016-06-18 DIAGNOSIS — R300 Dysuria: Secondary | ICD-10-CM | POA: Diagnosis not present

## 2016-06-18 LAB — URINALYSIS, ROUTINE W REFLEX MICROSCOPIC
Bilirubin Urine: NEGATIVE
Ketones, ur: NEGATIVE
NITRITE: NEGATIVE
Specific Gravity, Urine: 1.015 (ref 1.000–1.030)
TOTAL PROTEIN, URINE-UPE24: 100 — AB
Urine Glucose: NEGATIVE
Urobilinogen, UA: 0.2 (ref 0.0–1.0)
pH: 8 (ref 5.0–8.0)

## 2016-06-18 MED ORDER — NITROFURANTOIN MONOHYD MACRO 100 MG PO CAPS: 100.0000 mg | ORAL_CAPSULE | Freq: Two times a day (BID) | ORAL | 0 refills | Status: DC

## 2016-06-18 NOTE — Telephone Encounter (Signed)
Kimberly [name] states her UTI from May 11 is back.  States she has a lot of blood in her urine.  I told Kimberly [name] we would need to schedule her an appt.  Kimberly [name] is refusing to make appointment.  Kimberly [name] is requesting Dr. Quay Burow to send another script to her pharmacy.

## 2016-06-18 NOTE — Telephone Encounter (Signed)
Spoke with pt, informed her that per MD she will need to come to lab and give specimen. Orders have been entered.

## 2016-06-19 ENCOUNTER — Other Ambulatory Visit: Payer: Self-pay | Admitting: Rheumatology

## 2016-06-21 LAB — URINE CULTURE

## 2016-06-21 NOTE — Telephone Encounter (Signed)
At last visit in [place], she was on prednisone 7mg  qAM  and was suppose to decrease by 1mg  / month.  I called her to ask her for her current dose of prednisone; she may be on about 5mg  qAM per our long term prednisone taper schedule.    If so, I don't want to refill this request; instead , we will refill amount adequate to her current dose.

## 2016-06-21 NOTE — Telephone Encounter (Signed)
Last Visit: 04/15/16 Next Visit: 09/16/16  Okay to refill Prednisone?

## 2016-06-22 NOTE — Telephone Encounter (Signed)
I called pt again today to ask her about what dose of prednisone she is on before I refill pharmacy request.

## 2016-06-23 NOTE — Telephone Encounter (Signed)
I called pharmacy and spoke to the pharmacist.Pharmacist will send a text to the patient saying that her prednisone refill is denied and she needs to contact me at this office so we can coordinate care.I would like to know what her current dose of prednisone is and make sure she's tapering as scheduled and then I would like to refill her medication based on that information and update. Thank you

## 2016-06-25 ENCOUNTER — Other Ambulatory Visit: Payer: Self-pay | Admitting: Rheumatology

## 2016-06-25 MED ORDER — PREDNISONE 1 MG PO TABS: 2.0000 mg | ORAL_TABLET | Freq: Every day | ORAL | 1 refills | Status: DC

## 2016-06-25 NOTE — Telephone Encounter (Signed)
FYI ==> I sent in refill today

## 2016-06-25 NOTE — Progress Notes (Signed)
   REFILL PREDNISONE 1 mg; take 2 tablets by mouth daily with breakfast. Take with a 5 mg tablet for total of 7 mg dose; dispense 180 pills with 1 refill   Note: Pt flares with 5mg  prednisone (history of this in past).  Pt can do Prednisone 6mg  qAM most days and on occasion, do 7mg  qAM when she starts to flare.

## 2016-06-25 NOTE — Telephone Encounter (Signed)
Ok to refill prednisone as written. She has trouble going down on her prednisone and tries periods of 6mg  but when she flares, she has to use 7mg  for a few days. NOTEA;  Pt also reports she cannot go below 5mg .

## 2016-06-28 ENCOUNTER — Other Ambulatory Visit: Payer: Self-pay | Admitting: Neurology

## 2016-07-02 ENCOUNTER — Telehealth: Payer: Self-pay | Admitting: Neurology

## 2016-07-02 NOTE — Telephone Encounter (Signed)
PT called and said the pharmacy denied her prescription refill for pyridostigmine

## 2016-07-04 ENCOUNTER — Other Ambulatory Visit: Payer: Self-pay | Admitting: Internal Medicine

## 2016-07-04 DIAGNOSIS — I10 Essential (primary) hypertension: Secondary | ICD-10-CM

## 2016-07-05 NOTE — Telephone Encounter (Signed)
Patient has been notified that she will have to have a follow up appointment before refills are given.

## 2016-07-06 ENCOUNTER — Telehealth: Payer: Self-pay | Admitting: Neurology

## 2016-07-06 NOTE — Telephone Encounter (Signed)
Caller: Sunday Spillers   Urgent? No   Reason for the call: She is scheduled for a follow up visit on 11/05/16 and on the wait list as well. She said she will need a refill on her mestinon medication until her October appointment. Thanks

## 2016-07-07 ENCOUNTER — Other Ambulatory Visit: Payer: Self-pay | Admitting: *Deleted

## 2016-07-07 MED ORDER — PYRIDOSTIGMINE BROMIDE 60 MG PO TABS: 60.0000 mg | ORAL_TABLET | Freq: Four times a day (QID) | ORAL | 0 refills | Status: DC

## 2016-07-07 NOTE — Telephone Encounter (Signed)
Rx sent for 3 month supply.

## 2016-07-08 ENCOUNTER — Other Ambulatory Visit: Payer: Self-pay | Admitting: Internal Medicine

## 2016-07-08 DIAGNOSIS — I10 Essential (primary) hypertension: Secondary | ICD-10-CM

## 2016-07-16 ENCOUNTER — Telehealth: Payer: Self-pay | Admitting: Internal Medicine

## 2016-07-16 MED ORDER — NITROFURANTOIN MONOHYD MACRO 100 MG PO CAPS: 100.0000 mg | ORAL_CAPSULE | Freq: Two times a day (BID) | ORAL | 0 refills | Status: DC

## 2016-07-16 NOTE — Telephone Encounter (Signed)
Please see below.

## 2016-07-16 NOTE — Telephone Encounter (Signed)
Contacted pt and informed of PCP note

## 2016-07-16 NOTE — Telephone Encounter (Signed)
She should be evaluated - this could quickly turn into sepsis.  I will call in an antibiotic.  If no improvement in 24 hrs she needs to go to the ED.    She has had numerous infection in the past few months - I would like to refer her to a urologist for further evaluation if she agrees.

## 2016-07-16 NOTE — Telephone Encounter (Signed)
Patient Name: SIERAH LACEWELL DOB: July 06, 1945 Initial Comment Caller stated has another urinary tract infection with fever of 100.7 and chills and bleeding when urinating. Wanted to know if could have something called in. Nurse Assessment Nurse: Cherie Dark RN, Jarrett Soho Date/Time (Eastern Time): 07/16/2016 12:58:06 PM Confirm and document reason for call. If symptomatic, describe symptoms. ---Caller states she thinks she has another UTI. She has a fever of 100.7 and chills. Pain when urinating and there is blood in her urine. Does the patient have any new or worsening symptoms? ---Yes Will a triage be completed? ---Yes Related visit to physician within the last 2 weeks? ---No Does the PT have any chronic conditions? (i.e. diabetes, asthma, etc.) ---Yes List chronic conditions. ---frequent UTI's, myasthenia gravis, Is this a behavioral health or substance abuse call? ---No Guidelines Guideline Title Affirmed Question Affirmed Notes Urine - Blood In Fever > 100.5 F (38.1 C) Final Disposition User Go to ED Now (or PCP triage) Cherie Dark, RN, Jarrett Soho Comments Patient did not want to come into the office. Just wants to leave a specimen and get antibiotics. Spoke with someone at the backline and informed them of situation. She stated she would speak with Dr. Quay Burow about the situation and call the patient back. Referrals GO TO FACILITY REFUSED Disagree/Comply: Disagree Disagree/Comply Reason: Disagree with instructions

## 2016-08-09 ENCOUNTER — Encounter: Payer: Self-pay | Admitting: Neurology

## 2016-08-09 ENCOUNTER — Ambulatory Visit (INDEPENDENT_AMBULATORY_CARE_PROVIDER_SITE_OTHER): Payer: Medicare Other | Admitting: Neurology

## 2016-08-09 VITALS — BP 120/70 | HR 89 | Ht 63.0 in | Wt 277.2 lb

## 2016-08-09 DIAGNOSIS — G7001 Myasthenia gravis with (acute) exacerbation: Secondary | ICD-10-CM

## 2016-08-09 MED ORDER — PYRIDOSTIGMINE BROMIDE 60 MG PO TABS: 60.0000 mg | ORAL_TABLET | Freq: Three times a day (TID) | ORAL | 3 refills | Status: DC

## 2016-08-09 NOTE — Progress Notes (Signed)
Follow-up Visit   Date: 08/09/16    Kimberly [name] MRN: [id number] DOB: 02-02-1945   Interim History: Kimberly [name] is a 71 y.o. right-handed Caucasian female with with osteoarthritis, leg edema, leukoclastic vasculitis (2010) being followed by Dr. Estanislado Pandy, thoracic T1-2 laminectomy (2011), and cervical fusion at C5-6 (2008) by Dr. Vertell Limber returning to the clinic for follow-up of seronegative myasthenia gravis.  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  History of present illness: In 2011, patient started developing diplopia, blurred vision, ptosis, and gait abnormality for which she initially saw Dr. 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 She underwent MRI/A/V of the brain which showed T2 abnormality in the right cerebellar lesion so was referred her to Dr. Anna Genre at [facility]  who took over her care.  Per review of Dr. Alberteen Sam notes, the lesion was thought to be nonspecific and likely incidental as it would not explain all her symptoms.  She had two unsuccessful attempts with lumbar puncture and because of low suspicion for MS, it was not pursued again.  In the meantime, she also underwent repetitive nerve stimulation with single fiber EMG which showed evidence of neuromuscular junction disorder.   AChR antibodies were negative. At the time of her myasthenia gravis diagnosis, patient was already taking prednisone (possibly 30mg ?) for vasculitis, so was started on mestinon 60mg  three times daily which helped her muscle strength and resolved her diplopia.  She remained on prednisone but during the winters of 2013 and 2014, she was was struggling with pneumonia with pleural effusion requiring thoracentesis.  Because of this, she was started on methotrexate in January 2015 and reports not having pneumonia this past winter.  She has never been hospitalized for myasthenia, required intubation, or had any crisis.   She has been doing well from  an autoimmune stand-point and Dr. Estanislado Pandy has been slowly tapering her prednisone by 1mg  every 2 month.  She is tolerating methotrexate 20mg  weekly.  She continues to have right ptosis intermittently especially when tired, but no other weakness, dysphagia, dysarthria, or diplopia.    UPDATE 08/21/2014:   She is taking prednisone 7mg  daily and reports having a rough summer because she feels weaker in her arms and legs and energy level is low.  She does endorse generalized pain and is not sure if this is the reason why she feels unwell.  She is taking mestinon 60mg  three times daily and has never been on a high dose of this.     UPDATE 11/25/2014:  She had noticed a huge difference in her stamina since increased her mestinon to 60mg  four times daily.  In fact, she has lost 25lb since her last visit!  She tried self-tapering her prednisone to 6mg  in September, but within two weeks developed worsening pain so has been back on 7mg  daily.  She continues to have imbalance, no recent falls.   No new complaints.   She denies any double vision, dysphagia, or dysarthria.  UPDATE 08/09/2016:  She is doing well from a myasthenia standpoint and has not had any relapses.  She attempted to reduce prednisone 5mg  about 6 months ago and developed worsening polyarthralgias, so is back on 7mg  daily. She also remains on MTX 20mg  weekly.   She did not have any myasthenia symptoms despite reducing her prednisone.  She is on mestinon 60mg  three times daily, she tried reducing to twice daily, but noticed worsening ptosis.  She had one interval hospitalization  for pneumonia and is having recurrent UTIs since March.  She is contemplating right TKA due to severe pain and arthritis.  Medications:  Current Outpatient Prescriptions on File Prior to Visit  Medication Sig Dispense Refill  . aspirin EC 81 MG tablet Take 81 mg by mouth at bedtime.    Marland Kitchen augmented betamethasone dipropionate (DIPROLENE-AF) 0.05 % ointment Apply 1 application  topically daily. For vasculitis and psoriasis    . Calcium Carb-Cholecalciferol (CALCIUM 600-D PO) Take 600 mg by mouth at bedtime.    . cholecalciferol (VITAMIN D) 1000 units tablet Take 1,000 Units by mouth at bedtime.    . Cyanocobalamin (VITAMIN B-12 PO) Take 1 tablet by mouth at bedtime.    . folic acid (FOLVITE) 1 MG tablet TAKE 2 TABLETS BY MOUTH ONCE DAILY 180 tablet 4  . furosemide (LASIX) 40 MG tablet Take 1 tablet (40 mg total) by mouth daily. 90 tablet 1  . methotrexate (RHEUMATREX) 2.5 MG tablet TAKE 8 TABLETS EVERY WEEK 96 tablet 0  . Multiple Vitamin (MULTIVITAMIN WITH MINERALS) TABS tablet Take 1 tablet by mouth at bedtime.    . nitrofurantoin, macrocrystal-monohydrate, (MACROBID) 100 MG capsule Take 1 capsule (100 mg total) by mouth 2 (two) times daily. 14 capsule 0  . predniSONE (DELTASONE) 1 MG tablet Take 2 tablets (2 mg total) by mouth daily with breakfast. Take with a 5 mg tablet for a 7 mg dose 180 tablet 1  . predniSONE (DELTASONE) 5 MG tablet TAKE 1 TABLET BY MOUTH EVERY DAY WITH BREAKFAST AND WITH 2,1MG  PREDNISONE 90 tablet 0  . pyridOXINE (VITAMIN B-6) 100 MG tablet Take 100 mg by mouth at bedtime.    Marland Kitchen spironolactone (ALDACTONE) 25 MG tablet Take 1 tablet (25 mg total) by mouth daily. 90 tablet 0  . valsartan (DIOVAN) 320 MG tablet Take 1 tablet (320 mg total) by mouth daily. --- Office visit needed for further refills 90 tablet 0   No current facility-administered medications on file prior to visit.     Allergies:  Allergies  Allergen Reactions  . Azithromycin Other (See Comments)    Angioedema  . Levaquin [Levofloxacin In D5w] Other (See Comments)    D/T MYASTHENIA GRAVIS  . Tetracycline Rash  . Sulfonamide Derivatives     Seizure age 36 (Note: probably fever induced)  . Aleve [Naproxen] Other (See Comments)    Caused excessive salivation  . Amlodipine Besy-Benazepril Hcl Cough    Review of Systems:  CONSTITUTIONAL: No fevers, chills, night sweats, or  weight loss.  EYES: No visual changes or eye pain ENT: No hearing changes.  No history of nose bleeds.   RESPIRATORY: No cough, wheezing and shortness of breath.   CARDIOVASCULAR: Negative for chest pain, and palpitations.   GI: Negative for abdominal discomfort, blood in stools or black stools.  No recent change in bowel habits.   GU:  No history of incontinence.   MUSCLOSKELETAL: +history of joint pain or swelling.  No myalgias.   SKIN: Negative for lesions, rash, and itching.   ENDOCRINE: Negative for cold or heat intolerance, polydipsia or goiter.   PSYCH:  + depression or anxiety symptoms.   NEURO: As Above.   Vital Signs:  BP 120/70   Pulse 89   Ht 5\' 3"  (1.6 m)   Wt 277 lb 4 oz (125.8 kg)   SpO2 97%   BMI 49.11 kg/m   Neurological Exam: MENTAL STATUS including orientation to time, place, person, recent and remote memory, attention span and  concentration, language, and fund of knowledge is normal.  Speech is not dysarthric.  CRANIAL NERVES:   Pupils equal round and reactive to light.  Normal conjugate, extra-ocular eye movements in all directions of gaze.  Subtle right ptosis without worsening with sustained upward gaze. Normal facial muscles.  Face is symmetric. Palate elevates symmetrically.  Tongue is midline.  MOTOR:  Motor strength is 5/5 in all extremities. No pronator drift.  Tone is normal.    REFLEXES:  2+/4 in the upper extremities 1+/4 in the lower extremities  SENSORY:  Vibration intact throughout  COORDINATION/GAIT:   Gait slightly wide-based due to body habitus and mildly unsteady.  Data: Labs 08/08/2014:  HbA1c 5.3 Labs 04/09/2014:  TSH 3.217, B12 1422 Labs 01/30/2010:  AChR binding, blocking, and modulating antibody - negative, ANA negative, MPO antibody negative Labs 11/29/2009:  AChR binding, blocking, and modulating antibody - negative, ANA negative, dsDNA neg, RPR neg  MRI brain wo contrast 11/28/2009: 1.  Large area of T1 signal abnormality of the  right cerebellar hemisphere, involving the white matter.  This unusual appearance is most suggestive of a demyelinating process such as multiple sclerosis, ADEM, or PML.  Given the patient's immunosuppression, PML should be excluded.  Vasculitis is of high likelihood given the patient known history.    2.  Chronic parenchymal brain volume loss  MRA/V wo contrast 11/28/2009: 1.  Normal MRV of the brain 2.  No definite abnormality is noted on MRA of the brain.  There is unusual right posterior circulation branching pattern with an apparent fetal origin right PCA and an accessory posterior cerebral artery with an attached patent posterior communicating artery.  No significant stenosis.   MRI brain wwo contrast 11/29/2009: 1.  Stable appearance of asymmetric increased T2 signal within the right cerebellum with an underlying mass lesion or abnormal enhancement identified.  This finding is indeterminate, but may present underlying demyelinating disease.   RNS with SFEMG performed at [facility] 01/09/2010:  Abnormal study.  These electrodiagnostic findings are consistent with a disorder of neuromuscular transmission as can be seen in myasthenia gravis.   IMPRESSION: Mrs. Soberanes is a 71 year-old female with leukocytoclastic vasculitis on immunomodulatory therapy (MTX 20mg  weekly and prednisone 7mg ) returning for follow-up of seronegative myasthenia gravis (diagnosed in 2011 by RNS and SFEMG at [facility]).  Her myasthenia gravis is well controlled and she has not had any relapses.  Unfortunately, she is unable to tolerate less than 7mg  prednisone as her rheumatological symptoms exacerbate.  She will be continued on mestinon 60mg  TID and can take extra dose as needed.  From a MG-standpoint, it is safe to taper her prednisone as tolerable.  Right cerebellar lesion, less likely MS.  Two prior unsuccessful LPs and work-up at [facility].   Return to clinic in 1 year   The duration of this appointment visit was 25 minutes of  face-to-face time with the patient.  Greater than 50% of this time was spent in counseling, explanation of diagnosis, planning of further management, and coordination of care.   Thank you for allowing me to participate in patient's care.  If I can answer any additional questions, I would be pleased to do so.    Sincerely,    Shaylon Gillean K. Posey Pronto, DO

## 2016-08-09 NOTE — Patient Instructions (Signed)
Continue mestinon 60mg three times daily  Return to clinic in 1 year 

## 2016-08-10 ENCOUNTER — Other Ambulatory Visit: Payer: Self-pay | Admitting: Rheumatology

## 2016-08-10 NOTE — Telephone Encounter (Signed)
Last Visit: 04/15/16 Next Visit: 09/16/16 Labs: 05/10/16 stable, low calcium (drawn by pcp)  Left message to remind patient she is due for labs.  Okay to refill 30 day supply MTX?

## 2016-08-10 NOTE — Telephone Encounter (Signed)
Ok for 30 day 

## 2016-08-13 ENCOUNTER — Telehealth: Payer: Self-pay | Admitting: Internal Medicine

## 2016-08-13 ENCOUNTER — Other Ambulatory Visit: Payer: Self-pay | Admitting: Internal Medicine

## 2016-08-13 ENCOUNTER — Other Ambulatory Visit (INDEPENDENT_AMBULATORY_CARE_PROVIDER_SITE_OTHER): Payer: Medicare Other

## 2016-08-13 DIAGNOSIS — R300 Dysuria: Secondary | ICD-10-CM

## 2016-08-13 DIAGNOSIS — N390 Urinary tract infection, site not specified: Secondary | ICD-10-CM

## 2016-08-13 LAB — URINALYSIS, ROUTINE W REFLEX MICROSCOPIC
Bilirubin Urine: NEGATIVE
KETONES UR: NEGATIVE
Nitrite: POSITIVE — AB
PH: 6 (ref 5.0–8.0)
Specific Gravity, Urine: 1.01 (ref 1.000–1.030)
Total Protein, Urine: 100 — AB
URINE GLUCOSE: NEGATIVE

## 2016-08-13 MED ORDER — NITROFURANTOIN MONOHYD MACRO 100 MG PO CAPS: 100.0000 mg | ORAL_CAPSULE | Freq: Two times a day (BID) | ORAL | 0 refills | Status: DC

## 2016-08-13 NOTE — Telephone Encounter (Signed)
Please advise. This is a reoccuring thing.

## 2016-08-13 NOTE — Telephone Encounter (Signed)
Pt called stating she believes she has a UTI, she is coming into the lab for a Urinalysis,  FYI

## 2016-08-13 NOTE — Telephone Encounter (Signed)
Ok.  Let her know I have ordered a referral for her to see a urologist since this is recurring.

## 2016-08-13 NOTE — Telephone Encounter (Signed)
Pts husband is aware.  °

## 2016-08-15 ENCOUNTER — Other Ambulatory Visit: Payer: Self-pay | Admitting: Cardiovascular Disease

## 2016-08-15 DIAGNOSIS — N390 Urinary tract infection, site not specified: Secondary | ICD-10-CM | POA: Insufficient documentation

## 2016-08-15 NOTE — Progress Notes (Signed)
Subjective:    Patient ID: Kimberly [name], female    DOB: July 20, 1945, 71 y.o.   MRN: [id number]  HPI    Medications and allergies reviewed with patient and updated if appropriate.  Patient Active Problem List   Diagnosis Date Noted  . Dysuria 05/21/2016  . Psoriasis 04/13/2016  . History of diverticulitis 04/13/2016  . Osteopenia of multiple sites 04/13/2016  . DJD (degenerative joint disease), cervical 04/07/2016  . Primary osteoarthritis of both knees 04/07/2016  . Decreased abduction of right shoulder joint 04/07/2016  . Atrial tachycardia ([place]) 05/12/2015  . NSVT (nonsustained ventricular tachycardia) ([place]) 05/12/2015  . Abnormal urine odor 05/10/2015  . Abnormal nuclear stress test - INTERMEDIATE RISK 04/14/2015  . Coronary artery calcification seen on CAT scan 02/28/2015  . Fever, unspecified 02/17/2015  . Left lower quadrant pain 02/17/2015  . Hyperglycemia 08/09/2014  . Community acquired pneumonia 03/29/2013  . Dyspnea 03/20/2013  . Hypokalemia 12/31/2012  . Umbilical hernia 40/10/2723  . Pleural effusion 05/10/2012  . Myasthenia gravis without exacerbation ([place]) 02/12/2010  . DIPLOPIA 11/21/2009  . DISTURBANCE OF SKIN SENSATION 11/21/2009  . Vitamin D deficiency 06/25/2008  . Hyperlipidemia 06/25/2008  . Essential hypertension 06/25/2008  . Leukocytoclastic vasculitis ([place]) 06/19/2007  . MUSCLE WEAKNESS (GENERALIZED) 02/10/2007  . ELEVATED SEDIMENTATION RATE 02/10/2007  . Unspecified Myalgia and Myositis 01/24/2007  . EDEMA- LOCALIZED 01/24/2007  . ANA POSITIVE, HX OF 01/24/2007    Current Outpatient Prescriptions on File Prior to Visit  Medication Sig Dispense Refill  . aspirin EC 81 MG tablet Take 81 mg by mouth at bedtime.    Marland Kitchen augmented betamethasone dipropionate (DIPROLENE-AF) 0.05 % ointment Apply 1 application topically daily. For vasculitis and psoriasis    . Calcium Carb-Cholecalciferol (CALCIUM 600-D PO) Take 600 mg by mouth at bedtime.      . cholecalciferol (VITAMIN D) 1000 units tablet Take 1,000 Units by mouth at bedtime.    . Cyanocobalamin (VITAMIN B-12 PO) Take 1 tablet by mouth at bedtime.    . folic acid (FOLVITE) 1 MG tablet TAKE 2 TABLETS BY MOUTH ONCE DAILY 180 tablet 4  . furosemide (LASIX) 40 MG tablet Take 1 tablet (40 mg total) by mouth daily. 90 tablet 1  . methotrexate (RHEUMATREX) 2.5 MG tablet TAKE 8 TABLETS EVERY WEEK- DO NOT USE UNTIL DONE WITH ANTIBIOTICS AND WELL FROM ILLNESS 32 tablet 0  . Multiple Vitamin (MULTIVITAMIN WITH MINERALS) TABS tablet Take 1 tablet by mouth at bedtime.    . nitrofurantoin, macrocrystal-monohydrate, (MACROBID) 100 MG capsule Take 1 capsule (100 mg total) by mouth 2 (two) times daily. 14 capsule 0  . predniSONE (DELTASONE) 1 MG tablet Take 2 tablets (2 mg total) by mouth daily with breakfast. Take with a 5 mg tablet for a 7 mg dose 180 tablet 1  . predniSONE (DELTASONE) 5 MG tablet TAKE 1 TABLET BY MOUTH EVERY DAY WITH BREAKFAST AND WITH 2,1MG  PREDNISONE 90 tablet 0  . pyridostigmine (MESTINON) 60 MG tablet Take 1 tablet (60 mg total) by mouth 3 (three) times daily. 270 tablet 3  . pyridOXINE (VITAMIN B-6) 100 MG tablet Take 100 mg by mouth at bedtime.    Marland Kitchen spironolactone (ALDACTONE) 25 MG tablet Take 1 tablet (25 mg total) by mouth daily. 90 tablet 0  . valsartan (DIOVAN) 320 MG tablet Take 1 tablet (320 mg total) by mouth daily. --- Office visit needed for further refills 90 tablet 0   No current facility-administered medications on file prior to  visit.     Past Medical History:  Diagnosis Date  . Arthritis   . Atrial tachycardia ([place]) 05/12/2015  . Diplopia   . Family history of ischemic heart disease   . GERD (gastroesophageal reflux disease)   . Heart murmur   . Myalgia and myositis, unspecified   . Myasthenia gravis without exacerbation ([facility])    [facility], Dr Anna Genre  . NSVT (nonsustained ventricular tachycardia) ([place]) 05/12/2015  . Other and unspecified hyperlipidemia   .  Psoriasis    plantar ;Dr Jarome Matin  . Unspecified vitamin D deficiency    Dr Estanislado Pandy  . Vasculitis ([place])    Dr Estanislado Pandy    Past Surgical History:  Procedure Laterality Date  . APPENDECTOMY     & exploratory for inflammation; Dr Hassell Done  . CARDIAC CATHETERIZATION N/A 04/17/2015   Procedure: Left Heart Cath and Coronary Angiography;  Surgeon: Leonie Man, MD;  Location: [place] [facility];  Service: Cardiovascular;  Laterality: N/A;  . CERVICAL FUSION     C5-6; Dr Vertell Limber  . CHOLECYSTECTOMY     for stones  . LAMINECTOMY  2010   T1-2; Dr Vertell Limber  . SHOULDER SURGERY     Left & Right  . THORACENTESIS     for peripneumonic effusion  . TOTAL ABDOMINAL HYSTERECTOMY W/ BILATERAL SALPINGOOPHORECTOMY     For Fibroids & endometriosis    Social History   Social History  . Marital status: Married    Spouse name: N/A  . Number of children: N/A  . Years of education: N/A   Occupational History  . 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 Smoking status: Never Smoker  . Smokeless tobacco: Never Used  . Alcohol use No  . Drug use: No  . Sexual activity: Not on file   Other Topics Concern  . Not on file   Social History Narrative   Lives with husband in a one story home.  Had one child that only lived for 14 hours.     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          Epworth Sleepiness Scale = 2 (as of 03/14/2015)    Family History  Problem Relation Age of Onset  . Heart attack Mother 46  . Heart failure Mother   . Stroke Paternal Grandmother        in 23s  . Lung cancer Father   . Cancer Father        lung  . Hypertension Sister   . Hyperthyroidism Sister        [place]  . Hyperthyroidism Sister        [place]  . Diabetes Neg Hx     Review of Systems     Objective:  There were no vitals filed for this visit. There were no vitals filed for this visit. There is no height or weight on file to  calculate BMI.  Wt Readings from Last 3 Encounters:  08/09/16 277 lb 4 oz (125.8 kg)  05/21/16 275 lb (124.7 kg)  05/08/16 271 lb 14.4 oz (123.3 kg)     Physical Exam        Assessment & Plan:   See Problem List for Assessment and Plan of chronic medical problems.   This encounter was created in error - please disregard.

## 2016-08-15 NOTE — Assessment & Plan Note (Signed)
She has 5 documented UTI's in just over one year - all Ecoli All successfully treated with antibiotics Will refer to urology for further evaluation / treatment

## 2016-08-16 ENCOUNTER — Encounter: Payer: Medicare Other | Admitting: Internal Medicine

## 2016-08-16 LAB — URINE CULTURE

## 2016-08-16 NOTE — Telephone Encounter (Signed)
REFILL 

## 2016-08-16 NOTE — Telephone Encounter (Signed)
Please review for refill. Thanks!  

## 2016-08-19 NOTE — Patient Instructions (Addendum)
A referral has been ordered for urology.  They will call you to schedule.  Increase the lasix to 60 mg daily for three days and then return to your usual dose.    We will recheck your urine today and your kidney function/potassium.  Please go to the lab.  We will call you with the results.

## 2016-08-19 NOTE — Progress Notes (Signed)
Subjective:    Patient ID: Kimberly [name], female    DOB: 1945-09-13, 71 y.o.   MRN: [id number]  HPI She is here for follow up.  Recurrent UTI's:  She has had several UTIs in the past year.  Her most recent one started last week and I called in an antibiotic for her after her UA looked positive.  Her cultures have shown Ecoli.  She has taken nitrofurantoin for the past few infections.  She is unsure if it completely resolves the infection or not.  Today is her last day of antibiotics. Her symptoms have significantly improved.  She has some occasional dysuria and urgency still and wonders if the infection is still there.  She no longer has fever or chills.    Urine culture:  08/13/16: Kimberly [name]     06/18/16:  Ecoli   05/21/16;  Ecoli   04/20/16:  Ecoli    06/18/15:  Ecoli   Right leg swelling:  Her right leg has been swollen for 2-3 weeks.  The swelling in uppper region of lower leg is worse, but the skin in the lower part of the leg is very tight.  It is looking better today.  She has some chronic redness in the leg and that is also better today.  She denies warmth or tenderness - just discomfort.  The left leg has not been as affected -  There is some mild swelling but not like the right.  She denies injury to the leg and has remained active.  She does have bad arthritis in the right knee.  Overall, it is better today.     Medications and allergies reviewed with patient and updated if appropriate.  Patient Active Problem List   Diagnosis Date Noted  . UTI (urinary tract infection) 08/15/2016  . Recurrent UTI 08/15/2016  . Dysuria 05/21/2016  . Psoriasis 04/13/2016  . History of diverticulitis 04/13/2016  . Osteopenia of multiple sites 04/13/2016  . DJD (degenerative joint disease), cervical 04/07/2016  . Primary osteoarthritis of both knees 04/07/2016  . Decreased abduction of right shoulder joint 04/07/2016  . Atrial tachycardia ([place]) 05/12/2015  . NSVT (nonsustained ventricular  tachycardia) ([place]) 05/12/2015  . Abnormal urine odor 05/10/2015  . Abnormal nuclear stress test - INTERMEDIATE RISK 04/14/2015  . Coronary artery calcification seen on CAT scan 02/28/2015  . Fever, unspecified 02/17/2015  . Left lower quadrant pain 02/17/2015  . Hyperglycemia 08/09/2014  . Community acquired pneumonia 03/29/2013  . Dyspnea 03/20/2013  . Hypokalemia 12/31/2012  . Umbilical hernia 85/02/7739  . Pleural effusion 05/10/2012  . Myasthenia gravis without exacerbation ([place]) 02/12/2010  . DIPLOPIA 11/21/2009  . DISTURBANCE OF SKIN SENSATION 11/21/2009  . Vitamin D deficiency 06/25/2008  . Hyperlipidemia 06/25/2008  . Essential hypertension 06/25/2008  . Leukocytoclastic vasculitis ([place]) 06/19/2007  . MUSCLE WEAKNESS (GENERALIZED) 02/10/2007  . ELEVATED SEDIMENTATION RATE 02/10/2007  . Unspecified Myalgia and Myositis 01/24/2007  . EDEMA- LOCALIZED 01/24/2007  . ANA POSITIVE, HX OF 01/24/2007    Current Outpatient Prescriptions on File Prior to Visit  Medication Sig Dispense Refill  . aspirin EC 81 MG tablet Take 81 mg by mouth at bedtime.    Marland Kitchen augmented betamethasone dipropionate (DIPROLENE-AF) 0.05 % ointment Apply 1 application topically daily. For vasculitis and psoriasis    . Calcium Carb-Cholecalciferol (CALCIUM 600-D PO) Take 600 mg by mouth at bedtime.    . cholecalciferol (VITAMIN D) 1000 units tablet Take 1,000 Units by mouth at bedtime.    Marland Kitchen  Cyanocobalamin (VITAMIN B-12 PO) Take 1 tablet by mouth at bedtime.    . folic acid (FOLVITE) 1 MG tablet TAKE 2 TABLETS BY MOUTH ONCE DAILY 180 tablet 4  . furosemide (LASIX) 40 MG tablet Take 1 tablet (40 mg total) by mouth daily. 90 tablet 1  . methotrexate (RHEUMATREX) 2.5 MG tablet TAKE 8 TABLETS EVERY WEEK- DO NOT USE UNTIL DONE WITH ANTIBIOTICS AND WELL FROM ILLNESS 32 tablet 0  . Multiple Vitamin (MULTIVITAMIN WITH MINERALS) TABS tablet Take 1 tablet by mouth at bedtime.    . nitrofurantoin,  macrocrystal-monohydrate, (MACROBID) 100 MG capsule Take 1 capsule (100 mg total) by mouth 2 (two) times daily. 14 capsule 0  . predniSONE (DELTASONE) 1 MG tablet Take 2 tablets (2 mg total) by mouth daily with breakfast. Take with a 5 mg tablet for a 7 mg dose 180 tablet 1  . predniSONE (DELTASONE) 5 MG tablet TAKE 1 TABLET BY MOUTH EVERY DAY WITH BREAKFAST AND WITH 2,1MG  PREDNISONE 90 tablet 0  . pyridostigmine (MESTINON) 60 MG tablet Take 1 tablet (60 mg total) by mouth 3 (three) times daily. 270 tablet 3  . pyridOXINE (VITAMIN B-6) 100 MG tablet Take 100 mg by mouth at bedtime.    Marland Kitchen spironolactone (ALDACTONE) 25 MG tablet TAKE 1 TABLET (25 MG TOTAL) BY MOUTH DAILY. 90 tablet 0  . valsartan (DIOVAN) 320 MG tablet Take 1 tablet (320 mg total) by mouth daily. --- Office visit needed for further refills 90 tablet 0   No current facility-administered medications on file prior to visit.     Past Medical History:  Diagnosis Date  . Arthritis   . Atrial tachycardia ([place]) 05/12/2015  . Diplopia   . Family history of ischemic heart disease   . GERD (gastroesophageal reflux disease)   . Heart murmur   . Myalgia and myositis, unspecified   . Myasthenia gravis without exacerbation (H[facility])    [facility], Dr Anna Genre  . NSVT (nonsustained ventricular tachycardia) ([place]) 05/12/2015  . Other and unspecified hyperlipidemia   . Psoriasis    plantar ;Dr Jarome Matin  . Unspecified vitamin D deficiency    Dr Estanislado Pandy  . Vasculitis ([place])    Dr Estanislado Pandy    Past Surgical History:  Procedure Laterality Date  . APPENDECTOMY     & exploratory for inflammation; Dr Hassell Done  . CARDIAC CATHETERIZATION N/A 04/17/2015   Procedure: Left Heart Cath and Coronary Angiography;  Surgeon: Leonie Man, MD;  Location: [facility];  Service: Cardiovascular;  Laterality: N/A;  . CERVICAL FUSION     C5-6; Dr Vertell Limber  . CHOLECYSTECTOMY     for stones  . LAMINECTOMY  2010   T1-2; Dr Vertell Limber  . SHOULDER SURGERY     Left &  Right  . THORACENTESIS     for peripneumonic effusion  . TOTAL ABDOMINAL HYSTERECTOMY W/ BILATERAL SALPINGOOPHORECTOMY     For Fibroids & endometriosis    Social History   Social History  . Marital status: Married    Spouse name: N/A  . Number of children: N/A  . Years of education: N/A   Occupational History  . 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 Smoking status: Never Smoker  . Smokeless tobacco: Never Used  . Alcohol use No  . Drug use: No  . Sexual activity: Not on file   Other Topics Concern  . Not on file   Social History Narrative  Lives with husband in a one story home.  Had one child that only lived for 14 hours.     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          Epworth Sleepiness Scale = 2 (as of 03/14/2015)    Family History  Problem Relation Age of Onset  . Heart attack Mother 79  . Heart failure Mother   . Stroke Paternal Grandmother        in 74s  . Lung cancer Father   . Cancer Father        lung  . Hypertension Sister   . Hyperthyroidism Sister        Graves  . Hyperthyroidism Sister        Graves  . Diabetes Neg Hx     Review of Systems  Constitutional: Positive for appetite change (decreased), chills (with UTI) and fever (with UTI).  Gastrointestinal: Positive for abdominal pain (suprapubic pain). Negative for nausea.  Genitourinary: Positive for dysuria (occasionally now) and urgency (better but still there). Negative for difficulty urinating, frequency and hematuria.       Objective:   Vitals:   08/20/16 1032  BP: 138/82  Pulse: 77  Resp: 16  Temp: 98.5 F (36.9 C)  SpO2: 97%   Filed Weights   08/20/16 1032  Weight: 277 lb (125.6 kg)   Body mass index is 49.07 kg/m.  Wt Readings from Last 3 Encounters:  08/20/16 277 lb (125.6 kg)  08/09/16 277 lb 4 oz (125.8 kg)  05/21/16 275 lb (124.7 kg)     Physical Exam  Constitutional: She appears  well-developed and well-nourished. No distress.  HENT:  Head: Normocephalic and atraumatic.  Cardiovascular: Normal rate, regular rhythm and normal heart sounds.   No murmur heard. Pulmonary/Chest: Effort normal and breath sounds normal. No respiratory distress. She has no wheezes. She has no rales.  Musculoskeletal: She exhibits edema (2 +pitting edema right lower extremity, 1 + pitting edema in LLE).  Skin: Skin is warm and dry. She is not diaphoretic. There is erythema (b/l LE  R>L, no warmth, mild tenderness in one are of the RLE).          Assessment & Plan:   See Problem List for Assessment and Plan of chronic medical problems.

## 2016-08-20 ENCOUNTER — Encounter: Payer: Self-pay | Admitting: Internal Medicine

## 2016-08-20 ENCOUNTER — Ambulatory Visit (INDEPENDENT_AMBULATORY_CARE_PROVIDER_SITE_OTHER): Payer: Medicare Other | Admitting: Internal Medicine

## 2016-08-20 ENCOUNTER — Other Ambulatory Visit (INDEPENDENT_AMBULATORY_CARE_PROVIDER_SITE_OTHER): Payer: Medicare Other

## 2016-08-20 VITALS — BP 138/82 | HR 77 | Temp 98.5°F | Resp 16 | Wt 277.0 lb

## 2016-08-20 DIAGNOSIS — N390 Urinary tract infection, site not specified: Secondary | ICD-10-CM

## 2016-08-20 DIAGNOSIS — N3000 Acute cystitis without hematuria: Secondary | ICD-10-CM

## 2016-08-20 DIAGNOSIS — R600 Localized edema: Secondary | ICD-10-CM | POA: Diagnosis not present

## 2016-08-20 LAB — COMPREHENSIVE METABOLIC PANEL
ALBUMIN: 3.9 g/dL (ref 3.5–5.2)
ALK PHOS: 64 U/L (ref 39–117)
ALT: 15 U/L (ref 0–35)
AST: 16 U/L (ref 0–37)
BUN: 22 mg/dL (ref 6–23)
CO2: 31 mEq/L (ref 19–32)
Calcium: 9.8 mg/dL (ref 8.4–10.5)
Chloride: 101 mEq/L (ref 96–112)
Creatinine, Ser: 1 mg/dL (ref 0.40–1.20)
GFR: 58.27 mL/min — AB (ref >60.00)
GLUCOSE: 98 mg/dL (ref 70–99)
POTASSIUM: 3.8 meq/L (ref 3.5–5.1)
Sodium: 139 mEq/L (ref 135–145)
TOTAL PROTEIN: 7.6 g/dL (ref 6.0–8.3)
Total Bilirubin: 0.4 mg/dL (ref 0.2–1.2)

## 2016-08-20 LAB — URINALYSIS, ROUTINE W REFLEX MICROSCOPIC
Bilirubin Urine: NEGATIVE
KETONES UR: NEGATIVE
Nitrite: NEGATIVE
Specific Gravity, Urine: 1.02 (ref 1.000–1.030)
TOTAL PROTEIN, URINE-UPE24: 100 — AB
URINE GLUCOSE: NEGATIVE
UROBILINOGEN UA: 0.2 (ref 0.0–1.0)
pH: 6 (ref 5.0–8.0)

## 2016-08-20 NOTE — Assessment & Plan Note (Signed)
Recurrent - several UTIs Will refer to urology

## 2016-08-20 NOTE — Assessment & Plan Note (Signed)
Increased swelling x 2-3 weeks RLE > LLE No evidence of cellulitis Increase lasix to 60 mg daily x 3 days then resume 40 mg daily Continue all other medications cmp today Low sodium diet  Elevate legs when sitting Exercise Weight loss

## 2016-08-20 NOTE — Assessment & Plan Note (Signed)
Today is her last day of macrobid - still some residual symptoms Check UA, UCx

## 2016-08-23 ENCOUNTER — Telehealth: Payer: Self-pay | Admitting: Internal Medicine

## 2016-08-23 DIAGNOSIS — N390 Urinary tract infection, site not specified: Secondary | ICD-10-CM

## 2016-08-23 LAB — URINE CULTURE

## 2016-08-23 MED ORDER — CEPHALEXIN 500 MG PO CAPS: 500.0000 mg | ORAL_CAPSULE | Freq: Three times a day (TID) | ORAL | 0 refills | Status: DC

## 2016-08-23 NOTE — Telephone Encounter (Signed)
LVM informing pt on home phone.

## 2016-08-23 NOTE — Telephone Encounter (Signed)
Her urine still shows an infection.  I sent a new antibiotic - keflex.  Lets recheck the urine after she completes this antibiotic (ordered)

## 2016-08-24 ENCOUNTER — Other Ambulatory Visit: Payer: Self-pay | Admitting: Rheumatology

## 2016-08-26 NOTE — Telephone Encounter (Signed)
04/15/16 last visit  09/16/16 next visit  Prednisone7 mg po qd,  Ok to refill ?

## 2016-08-26 NOTE — Telephone Encounter (Signed)
ok 

## 2016-08-27 ENCOUNTER — Other Ambulatory Visit: Payer: Self-pay | Admitting: Rheumatology

## 2016-08-30 ENCOUNTER — Telehealth: Payer: Self-pay | Admitting: Internal Medicine

## 2016-08-30 ENCOUNTER — Other Ambulatory Visit: Payer: Self-pay | Admitting: Rheumatology

## 2016-08-30 MED ORDER — LOSARTAN POTASSIUM 100 MG PO TABS: 100.0000 mg | ORAL_TABLET | Freq: Every day | ORAL | 3 refills | Status: DC

## 2016-08-30 NOTE — Telephone Encounter (Signed)
Kidney and liver tests normal.    Losartan sent to [facility].

## 2016-08-30 NOTE — Telephone Encounter (Signed)
Patient called in stating she needs a medication in place of valsartan.  States she received letter over the weekend from pharmacy that her medication manufacturer had now been recalled.   Also, would like call back on recent labs once reviewed.   [facility] does use [facility].

## 2016-08-31 NOTE — Telephone Encounter (Signed)
Referral has been sent to [facility].  Will review urine results and treat if infection is still present.

## 2016-08-31 NOTE — Telephone Encounter (Signed)
Spoke with pt to inform. Pt understood to keep a watch on BP.   Pt also states that she had Clots of blood in urine this morning. Took last pill last night of antibiotic. She is going to bring in a urine sample as told once she finished the antibiotic. Advised we would keep in touch as far as results.

## 2016-09-01 ENCOUNTER — Other Ambulatory Visit (INDEPENDENT_AMBULATORY_CARE_PROVIDER_SITE_OTHER): Payer: Medicare Other

## 2016-09-01 DIAGNOSIS — N390 Urinary tract infection, site not specified: Secondary | ICD-10-CM | POA: Diagnosis not present

## 2016-09-01 LAB — URINALYSIS, ROUTINE W REFLEX MICROSCOPIC
BILIRUBIN URINE: NEGATIVE
Nitrite: NEGATIVE
Specific Gravity, Urine: 1.02 (ref 1.000–1.030)
Total Protein, Urine: 30 — AB
Urine Glucose: NEGATIVE
Urobilinogen, UA: 0.2 (ref 0.0–1.0)
pH: 5.5 (ref 5.0–8.0)

## 2016-09-02 ENCOUNTER — Other Ambulatory Visit: Payer: Self-pay | Admitting: Internal Medicine

## 2016-09-02 DIAGNOSIS — N390 Urinary tract infection, site not specified: Secondary | ICD-10-CM

## 2016-09-02 LAB — URINE CULTURE

## 2016-09-02 MED ORDER — FOSFOMYCIN TROMETHAMINE 3 G PO PACK: 3.0000 g | PACK | Freq: Once | ORAL | 0 refills | Status: AC

## 2016-09-06 ENCOUNTER — Other Ambulatory Visit (INDEPENDENT_AMBULATORY_CARE_PROVIDER_SITE_OTHER): Payer: Medicare Other

## 2016-09-06 DIAGNOSIS — N390 Urinary tract infection, site not specified: Secondary | ICD-10-CM

## 2016-09-06 LAB — URINALYSIS, ROUTINE W REFLEX MICROSCOPIC
Bilirubin Urine: NEGATIVE
KETONES UR: NEGATIVE
Nitrite: NEGATIVE
PH: 5.5 (ref 5.0–8.0)
SPECIFIC GRAVITY, URINE: 1.02 (ref 1.000–1.030)
Urine Glucose: NEGATIVE
Urobilinogen, UA: 0.2 (ref 0.0–1.0)

## 2016-09-07 ENCOUNTER — Other Ambulatory Visit: Payer: Self-pay | Admitting: Rheumatology

## 2016-09-07 LAB — URINE CULTURE

## 2016-09-07 NOTE — Telephone Encounter (Signed)
04/15/16 last visit  09/16/16 next visit  Labs: 05/10/16 Hgb 10.2 WBC 11.4 Previous Hgb 10.6 WBC 11.5  Left message to advise patient she is due for labs.  Okay to refill 30 day supply MTX?

## 2016-09-07 NOTE — Telephone Encounter (Signed)
ok 

## 2016-09-08 ENCOUNTER — Telehealth: Payer: Self-pay | Admitting: Internal Medicine

## 2016-09-08 DIAGNOSIS — N390 Urinary tract infection, site not specified: Secondary | ICD-10-CM

## 2016-09-08 NOTE — Telephone Encounter (Signed)
Pt called checking on the results from her Urinalysis and Culture. She said that the symptoms are back and wanted to know what she needed to do.

## 2016-09-09 MED ORDER — NITROFURANTOIN MONOHYD MACRO 100 MG PO CAPS: 100.0000 mg | ORAL_CAPSULE | Freq: Two times a day (BID) | ORAL | 0 refills | Status: DC

## 2016-09-09 NOTE — Telephone Encounter (Signed)
Informed pt .

## 2016-09-09 NOTE — Telephone Encounter (Signed)
It may be - kidney ultrasound ordered

## 2016-09-09 NOTE — Telephone Encounter (Signed)
Her urinalysis looks like she has an infection, but her culture does not confirm an infection - she has more than one bacteria.  I am not sure if it was not a good sample of what the cause was.     I will send in an antibiotic for a slightly longer course.  When is her appt with urology?

## 2016-09-09 NOTE — Telephone Encounter (Signed)
Spoke with pt to inform. Pt states she has a substance in her urine that is like sand. She states she has had this before and after she saw blood in her urine. Is this a sign of stones?

## 2016-09-11 NOTE — Progress Notes (Deleted)
Office Visit Note  Patient: Kimberly [name]             Date of Birth: [birth year]           MRN: [id number]             PCP: [name], MD Referring: [name], MD Visit Date: 09/16/2016 Occupation: @GUAROCC @    Subjective:  No chief complaint on file.   History of Present Illness: Kimberly [name] is a 71 y.o. female ***   Activities of Daily Living:  Patient reports morning stiffness for *** {minute/hour:19697}.   Patient {ACTIONS;DENIES/REPORTS:21021675::"Denies"} nocturnal pain.  Difficulty dressing/grooming: {ACTIONS;DENIES/REPORTS:21021675::"Denies"} Difficulty climbing stairs: {ACTIONS;DENIES/REPORTS:21021675::"Denies"} Difficulty getting out of chair: {ACTIONS;DENIES/REPORTS:21021675::"Denies"} Difficulty using hands for taps, buttons, cutlery, and/or writing: {ACTIONS;DENIES/REPORTS:21021675::"Denies"}   No Rheumatology ROS completed.   PMFS History:  Patient Active Problem List   Diagnosis Date Noted  . Leg edema, right 08/20/2016  . UTI (urinary tract infection) 08/15/2016  . Recurrent UTI 08/15/2016  . Dysuria 05/21/2016  . Psoriasis 04/13/2016  . History of diverticulitis 04/13/2016  . Osteopenia of multiple sites 04/13/2016  . DJD (degenerative joint disease), cervical 04/07/2016  . Primary osteoarthritis of both knees 04/07/2016  . Decreased abduction of right shoulder joint 04/07/2016  . Atrial tachycardia ([place]) 05/12/2015  . NSVT (nonsustained ventricular tachycardia) ([place]) 05/12/2015  . Abnormal urine odor 05/10/2015  . Abnormal nuclear stress test - INTERMEDIATE RISK 04/14/2015  . Coronary artery calcification seen on CAT scan 02/28/2015  . Fever, unspecified 02/17/2015  . Left lower quadrant pain 02/17/2015  . Hyperglycemia 08/09/2014  . Community acquired pneumonia 03/29/2013  . Dyspnea 03/20/2013  . Hypokalemia 12/31/2012  . Umbilical hernia 40/98/1191  . Pleural effusion 05/10/2012  . Myasthenia gravis without exacerbation  ([place]) 02/12/2010  . DIPLOPIA 11/21/2009  . DISTURBANCE OF SKIN SENSATION 11/21/2009  . Vitamin D deficiency 06/25/2008  . Hyperlipidemia 06/25/2008  . Essential hypertension 06/25/2008  . Leukocytoclastic vasculitis ([place]) 06/19/2007  . MUSCLE WEAKNESS (GENERALIZED) 02/10/2007  . ELEVATED SEDIMENTATION RATE 02/10/2007  . Unspecified Myalgia and Myositis 01/24/2007  . EDEMA- LOCALIZED 01/24/2007  . ANA POSITIVE, HX OF 01/24/2007    Past Medical History:  Diagnosis Date  . Arthritis   . Atrial tachycardia ([place]) 05/12/2015  . Diplopia   . Family history of ischemic heart disease   . GERD (gastroesophageal reflux disease)   . Heart murmur   . Myalgia and myositis, unspecified   . Myasthenia gravis without exacerbation (HCC)    [facility], Dr Anna Genre  . NSVT (nonsustained ventricular tachycardia) ([place]) 05/12/2015  . Other and unspecified hyperlipidemia   . Psoriasis    plantar ;Dr Jarome Matin  . Unspecified vitamin D deficiency    Dr Estanislado Pandy  . Vasculitis ([place])    Dr Estanislado Pandy    Family History  Problem Relation Age of Onset  . Heart attack Mother 33  . Heart failure Mother   . Stroke Paternal Grandmother        in 44s  . Lung cancer Father   . Cancer Father        lung  . Hypertension Sister   . Hyperthyroidism Sister        [name]  . Hyperthyroidism Sister        [name]  . Diabetes Neg Hx    Past Surgical History:  Procedure Laterality Date  . APPENDECTOMY     & exploratory for inflammation; Dr Hassell Done  . CARDIAC CATHETERIZATION N/A 04/17/2015   Procedure: Left  Heart Cath and Coronary Angiography;  Surgeon: Leonie Man, MD;  Location: [facility];  Service: Cardiovascular;  Laterality: N/A;  . CERVICAL FUSION     C5-6; Dr Vertell Limber  . CHOLECYSTECTOMY     for stones  . LAMINECTOMY  2010   T1-2; Dr Vertell Limber  . SHOULDER SURGERY     Left & Right  . THORACENTESIS     for peripneumonic effusion  . TOTAL ABDOMINAL HYSTERECTOMY W/ BILATERAL SALPINGOOPHORECTOMY     For  Fibroids & endometriosis   Social History   Social History Narrative   Lives with husband in a one story home.  Had one child that only lived for 14 hours.     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          Epworth Sleepiness Scale = 2 (as of 03/14/2015)     Objective: Vital Signs: There were no vitals taken for this visit.   Physical Exam   Musculoskeletal Exam: ***  CDAI Exam: No CDAI exam completed.    Investigation: No additional findings. CBC Latest Ref Rng & Units 05/10/2016 05/09/2016 05/08/2016  WBC 4.0 - 10.5 K/uL 11.4(H) 11.5(H) 15.1(H)  Hemoglobin 12.0 - 15.0 g/dL 10.2(L) 10.6(L) 12.0  Hematocrit 36.0 - 46.0 % 33.0(L) 33.1(L) 38.2  Platelets 150 - 400 K/uL 263 252 311   CMP Latest Ref Rng & Units 08/20/2016 05/10/2016 05/09/2016  Glucose 70 - 99 mg/dL 98 92 99  BUN 6 - 23 mg/dL 22 15 16   Creatinine 0.40 - 1.20 mg/dL 1.00 0.88 0.76  Sodium 135 - 145 mEq/L 139 139 139  Potassium 3.5 - 5.1 mEq/L 3.8 3.6 4.1  Chloride 96 - 112 mEq/L 101 103 102  CO2 19 - 32 mEq/L 31 29 29   Calcium 8.4 - 10.5 mg/dL 9.8 8.3(L) 8.9  Total Protein 6.0 - 8.3 g/dL 7.6 5.9(L) 6.1(L)  Total Bilirubin 0.2 - 1.2 mg/dL 0.4 0.8 1.0  Alkaline Phos 39 - 117 U/L 64 51 51  AST 0 - 37 U/L 16 22 20   ALT 0 - 35 U/L 15 16 17     Imaging: No results found.  Speciality Comments: No specialty comments available.    Procedures:  No procedures performed Allergies: Azithromycin; Levaquin [levofloxacin in d5w]; Tetracycline; Sulfonamide derivatives; Aleve [naproxen]; and Amlodipine besy-benazepril hcl   Assessment / Plan:     Visit Diagnoses: Leukocytoclastic vasculitis ([place]) - biopsy-proven.  She had lower extremity involvement. Sed Rate: 9 in 04/2016  High risk medication use - Prednisone7 mg po qd, methotrexate 8 tablets by mouth every week, folic acid 2 mg by mouth daily. She had difficulty tapering her prednisone in the past  DJD (degenerative joint  disease), cervical - chronic pain  Primary osteoarthritis of both knees - Bilateral severe: Chronic pain  Psoriasis - Followed up by Dr. Jarome Matin  Osteopenia of multiple sites - T score -1.1 2013. Patient has not had a bone density and she's been on prednisone  Hx of repair of rotator cuff - bilateral, limited abduction  Myasthenia gravis without exacerbation ([place])  Vitamin D deficiency  History of hypertension  Mixed hyperlipidemia  History of diverticulitis  History of gastroesophageal reflux (GERD)  Frequency of urination    Orders: No orders of the defined types were placed in this encounter.  No orders of the defined types were placed in this encounter.   Face-to-face time spent with patient was *** minutes. 50% of time was  spent in counseling and coordination of care.  Follow-Up Instructions: No Follow-up on file.   Earnestine Mealing, NT  Note - This record has been created using Editor, commissioning.  Chart creation errors have been sought, but may not always  have been located. Such creation errors do not reflect on  the standard of medical care.

## 2016-09-16 ENCOUNTER — Encounter: Payer: Self-pay | Admitting: Cardiovascular Disease

## 2016-09-16 ENCOUNTER — Ambulatory Visit (INDEPENDENT_AMBULATORY_CARE_PROVIDER_SITE_OTHER): Payer: Medicare Other | Admitting: Cardiovascular Disease

## 2016-09-16 ENCOUNTER — Ambulatory Visit: Payer: Medicare Other | Admitting: Rheumatology

## 2016-09-16 VITALS — BP 124/68 | HR 75 | Ht 62.5 in | Wt 272.6 lb

## 2016-09-16 DIAGNOSIS — I4729 Other ventricular tachycardia: Secondary | ICD-10-CM

## 2016-09-16 DIAGNOSIS — I10 Essential (primary) hypertension: Secondary | ICD-10-CM | POA: Diagnosis not present

## 2016-09-16 DIAGNOSIS — I472 Ventricular tachycardia: Secondary | ICD-10-CM

## 2016-09-16 DIAGNOSIS — R002 Palpitations: Secondary | ICD-10-CM

## 2016-09-16 DIAGNOSIS — R600 Localized edema: Secondary | ICD-10-CM

## 2016-09-16 NOTE — Progress Notes (Signed)
Cardiology Office Note   Date:  09/16/2016   ID:  Kimberly [name], DOB 1945-09-20, MRN [id number]  PCP:  [name], MD  Cardiologist:   Skeet Latch, MD   Chief Complaint  Patient presents with  . Follow-up  . Shortness of Breath    exertion  . Edema    right leg.  . Leg Pain    cramping in legs at night.       Patient ID: Kimberly [name] is a 71 y.o. female with hypertension, coronary artery calcification, myasthenia gravis, recurrent R pleural effusion, hyperlipidemia who presents for follow up. She was initially seen 03/2015 for evaluation of asymptomatic coronary calcifications.  She had a chest CT to follow up on pleural effusions and was noted to have coronary calcification. She was asymptomatic but did not exercise much due to myasthenia.  She had a [employer] 03/2015 that was concerning for inferior and apical ischemia.  She subsequently underwent left heart catheterization 04/2015 that showed normal, but tortuous coronary arteries.  She reported palpitations and had a 30 day event monitor that showed paroxysmal atrial tachycardia and NSVT. She was not started on any nodal agents due to her history of myasthenia.  She had an echo 05/2015 that revealed LVEF 60-65% with normal diastolic function.  Since her last appointment had one episode of fast heart rates.  It always occurs at night.  This episode lasted for less than one hour.  There was no lightheadedness, dizziness or shortness of breath.  She was also hospitalized for pneumonia in April and she is finally recovering her strength.  She continues to struggle with recurrent UTIs.  She has bee on 9 different antibiotics.  She is scheduled to follow up with urology next week.  Kimberly [name] also has R LE lymphedema.  She is planning to have knee surgery.  She denies orthopnea or PND.   Past Medical History:  Diagnosis Date  . Arthritis   . Atrial tachycardia ([place]) 05/12/2015  . Diplopia   . Family history of  ischemic heart disease   . GERD (gastroesophageal reflux disease)   . Heart murmur   . Myalgia and myositis, unspecified   . Myasthenia gravis without exacerbation (HCC)    [facility], Dr Anna Genre  . NSVT (nonsustained ventricular tachycardia) ([place]) 05/12/2015  . Other and unspecified hyperlipidemia   . Psoriasis    plantar ;Dr Jarome Matin  . Unspecified vitamin D deficiency    Dr Estanislado Pandy  . Vasculitis ([place])    Dr Estanislado Pandy    Past Surgical History:  Procedure Laterality Date  . APPENDECTOMY     & exploratory for inflammation; Dr Hassell Done  . CARDIAC CATHETERIZATION N/A 04/17/2015   Procedure: Left Heart Cath and Coronary Angiography;  Surgeon: Leonie Man, MD;  Location: [facility];  Service: Cardiovascular;  Laterality: N/A;  . CERVICAL FUSION     C5-6; Dr Vertell Limber  . CHOLECYSTECTOMY     for stones  . LAMINECTOMY  2010   T1-2; Dr Vertell Limber  . SHOULDER SURGERY     Left & Right  . THORACENTESIS     for peripneumonic effusion  . TOTAL ABDOMINAL HYSTERECTOMY W/ BILATERAL SALPINGOOPHORECTOMY     For Fibroids & endometriosis     Current Outpatient Prescriptions  Medication Sig Dispense Refill  . aspirin EC 81 MG tablet Take 81 mg by mouth at bedtime.    Marland Kitchen augmented betamethasone dipropionate (DIPROLENE-AF) 0.05 % ointment Apply 1 application topically  daily. For vasculitis and psoriasis    . Calcium Carb-Cholecalciferol (CALCIUM 600-D PO) Take 600 mg by mouth at bedtime.    . cephALEXin (KEFLEX) 500 MG capsule Take 1 capsule (500 mg total) by mouth 3 (three) times daily. 21 capsule 0  . cholecalciferol (VITAMIN D) 1000 units tablet Take 1,000 Units by mouth at bedtime.    . Cyanocobalamin (VITAMIN B-12 PO) Take 1 tablet by mouth at bedtime.    . folic acid (FOLVITE) 1 MG tablet TAKE 2 TABLETS BY MOUTH ONCE DAILY 180 tablet 4  . furosemide (LASIX) 40 MG tablet Take 1 tablet (40 mg total) by mouth daily. 90 tablet 1  . losartan (COZAAR) 100 MG tablet Take 1 tablet (100 mg total) by  mouth daily. 90 tablet 3  . methotrexate (RHEUMATREX) 2.5 MG tablet TAKE 8 TABLETS EVERY WEEK (DO NOT USE UNTIL DONE WITH ANTIBIOTICS & WELL FROM ILLNESS) 32 tablet 0  . Multiple Vitamin (MULTIVITAMIN WITH MINERALS) TABS tablet Take 1 tablet by mouth at bedtime.    . nitrofurantoin, macrocrystal-monohydrate, (MACROBID) 100 MG capsule Take 1 capsule (100 mg total) by mouth 2 (two) times daily. 20 capsule 0  . predniSONE (DELTASONE) 1 MG tablet Take 2 tablets (2 mg total) by mouth daily with breakfast. Take with a 5 mg tablet for a 7 mg dose 180 tablet 1  . predniSONE (DELTASONE) 5 MG tablet TAKE 1 TABLET BY MOUTH EVERY DAY WITH BREAKFAST AND WITH 2 PREDNISONE 1MG  TABLETS 90 tablet 0  . pyridostigmine (MESTINON) 60 MG tablet Take 1 tablet (60 mg total) by mouth 3 (three) times daily. 270 tablet 3  . pyridOXINE (VITAMIN B-6) 100 MG tablet Take 100 mg by mouth at bedtime.    Marland Kitchen spironolactone (ALDACTONE) 25 MG tablet TAKE 1 TABLET (25 MG TOTAL) BY MOUTH DAILY. 90 tablet 0   No current facility-administered medications for this visit.     Allergies:   Azithromycin; Levaquin [levofloxacin in d5w]; Tetracycline; Sulfonamide derivatives; Aleve [naproxen]; and Amlodipine besy-benazepril hcl    Social History:  The patient  reports that she has never smoked. She has never used smokeless tobacco. She reports that she does not drink alcohol or use drugs.   Family History:  The patient's family history includes Cancer in her father; Heart attack (age of onset: 2) in her mother; Heart failure in her mother; Hypertension in her sister; Hyperthyroidism in her sister and sister; Lung cancer in her father; Stroke in her paternal grandmother.   ROS:  Please see the history of present illness.   Otherwise, review of systems are positive for none.   All other systems are reviewed and negative.   PHYSICAL EXAM: VS:  BP 124/68   Pulse 75   Ht 5' 2.5" (1.588 m)   Wt 123.7 kg (272 lb 9.6 oz)   BMI 49.06 kg/m  ,  BMI Body mass index is 49.06 kg/m. GENERAL:  Well appearing HEENT: Pupils equal round and reactive, fundi not visualized, oral mucosa unremarkable NECK:  No jugular venous distention, waveform within normal limits, carotid upstroke brisk and symmetric, no bruits, no thyromegaly LYMPHATICS:  No cervical adenopathy LUNGS:  Clear to auscultation bilaterally HEART:  RRR.  PMI not displaced or sustained,S1 and S2 within normal limits, no S3, no S4, no clicks, no rubs, no murmurs ABD:  Flat, positive bowel sounds normal in frequency in pitch, no bruits, no rebound, no guarding, no midline pulsatile mass, no hepatomegaly, no splenomegaly EXT:  2 plus pulses throughout,  no edema, no cyanosis no clubbing SKIN:  No rashes no nodules NEURO:  Cranial nerves II through XII grossly intact, motor grossly intact throughout PSYCH:  Cognitively intact, oriented to person place and time   EKG:  EKG is not ordered today.  Echo 05/26/15: Study Conclusions  - Left ventricle: The cavity size was mildly dilated. Systolic   function was normal. The estimated ejection fraction was in the   range of 60% to 65%. Wall motion was normal; there were no   regional wall motion abnormalities. Left ventricular diastolic   function parameters were normal. - Aortic valve: Transvalvular velocity was within the normal range.   There was no stenosis. There was no regurgitation. - Mitral valve: There was no regurgitation. - Left atrium: The atrium was mildly dilated. - Right ventricle: The cavity size was normal. Wall thickness was   normal. Systolic function was normal. - Tricuspid valve: There was no regurgitation. - Inferior vena cava: The vessel was normal in size. The   respirophasic diameter changes were in the normal range (= 50%),   consistent with normal central venous pressure.   28 Day Event Monitor 04/01/15:  Quality: Fair.  Baseline artifact. Predominant rhythm: sinus rhythm Average heart rate: 70  bpm Max heart rate: 126 bpm Min heart rate: 50 bpm  Short runs of atrial tachycardia lasting up to 11 seconds.  One 6 beat run of NSVT.   Lexiscan Cardiolite 03/28/15: 1. Nuclear stress EF: 71%. 2. The left ventricular ejection fraction is hyperdynamic (>65%). 3. There was no ST segment deviation noted during stress. 4. This is an intermediate risk study. 5. Findings consistent with ischemia.  Technically difficult study due to increased subdiaphragmatic activity; intermediate risk with small, severe, reversible defects in the apical and inferior basal walls consistent with ischemia; EF 71 with normal wall motion.  LHC 04/17/15: 6. The left ventricular systolic function is normal. 7. Angiographically normal coronary arteries, but very tortuous   Angiographically no evidence of any significant lesions to explain the patient's abnormal stress test. There does appear to be mild calcification in the LAD distribution, but certainly not on the luminal aspect.  Recent Labs: 05/10/2016: Hemoglobin 10.2; Platelets 263 08/20/2016: ALT 15; BUN 22; Creatinine, Ser 1.00; Potassium 3.8; Sodium 139    Lipid Panel    Component Value Date/Time   CHOL 256 (H) 08/09/2014 0928   TRIG 180 (H) 08/09/2014 0928   HDL 74 08/09/2014 0928   LDLCALC 146 (H) 08/09/2014 0928      Wt Readings from Last 3 Encounters:  09/16/16 123.7 kg (272 lb 9.6 oz)  08/20/16 125.6 kg (277 lb)  08/09/16 125.8 kg (277 lb 4 oz)      ASSESSMENT AND PLAN:  # Coronary calcification: # Abnormal stress: No significant CAD on cath. She denies chest pain. Continue aspirin. Avoid statins or nodal agents due to her history of myasthenia.  # Hypertension: BP well-controlled.  was initially elevated but better on repeat.  Continue furosemide, spironolactone and losartan.  # Paroxysmal atrial tachycardia, NSVT: 30 day event monitor showed runs of atrial tachycardia as well as one run of nonsustained ventricular tachycardia.  Since her last appointment she had one episode.  No nodal agents 2/2 Myasthenia.  No ischmia or underlying structural heart disease.   Current medicines are reviewed at length with the patient today.  The patient does not have concerns regarding medicines.  The following changes have been made:  None  Labs/ tests ordered today include:  No orders of the defined types were placed in this encounter.   Disposition:   FU with Abagail Limb C. Oval Linsey, MD, [facility] in 1 year.   This note was written with the assistance of speech recognition software.  Please excuse any transcriptional errors.  Signed, Christohper Dube C. Oval Linsey, MD, [facility]  09/16/2016 2:34 PM    [facility]

## 2016-09-16 NOTE — Patient Instructions (Signed)

## 2016-09-17 ENCOUNTER — Ambulatory Visit
Admission: RE | Admit: 2016-09-17 | Discharge: 2016-09-17 | Disposition: A | Discharge status: Home / Self Care | Payer: Medicare Other | Source: Ambulatory Visit | Attending: Internal Medicine | Admitting: Internal Medicine

## 2016-09-17 DIAGNOSIS — N390 Urinary tract infection, site not specified: Secondary | ICD-10-CM | POA: Diagnosis not present

## 2016-09-22 ENCOUNTER — Telehealth: Payer: Self-pay | Admitting: Internal Medicine

## 2016-09-22 NOTE — Telephone Encounter (Signed)
Spoke with pt to inform, pt has an appt with Urology tomorrow and will contact us on Friday to let us know if she would like the CT scan or not.

## 2016-09-22 NOTE — Telephone Encounter (Signed)
Patient returning call on results.  Requesting call back at work.

## 2016-09-23 DIAGNOSIS — N3020 Other chronic cystitis without hematuria: Secondary | ICD-10-CM | POA: Diagnosis not present

## 2016-09-23 DIAGNOSIS — R310 Gross hematuria: Secondary | ICD-10-CM | POA: Diagnosis not present

## 2016-09-23 DIAGNOSIS — N3021 Other chronic cystitis with hematuria: Secondary | ICD-10-CM | POA: Diagnosis not present

## 2016-09-27 DIAGNOSIS — N3021 Other chronic cystitis with hematuria: Secondary | ICD-10-CM | POA: Diagnosis not present

## 2016-09-27 DIAGNOSIS — R3989 Other symptoms and signs involving the genitourinary system: Secondary | ICD-10-CM | POA: Diagnosis not present

## 2016-09-27 DIAGNOSIS — N3020 Other chronic cystitis without hematuria: Secondary | ICD-10-CM | POA: Diagnosis not present

## 2016-10-01 DIAGNOSIS — N321 Vesicointestinal fistula: Secondary | ICD-10-CM | POA: Diagnosis not present

## 2016-10-01 DIAGNOSIS — N3020 Other chronic cystitis without hematuria: Secondary | ICD-10-CM | POA: Diagnosis not present

## 2016-10-04 ENCOUNTER — Other Ambulatory Visit: Payer: Self-pay | Admitting: Neurology

## 2016-10-11 ENCOUNTER — Other Ambulatory Visit: Payer: Self-pay | Admitting: General Surgery

## 2016-10-11 ENCOUNTER — Encounter (HOSPITAL_COMMUNITY): Payer: Self-pay | Admitting: *Deleted

## 2016-10-11 ENCOUNTER — Ambulatory Visit: Payer: Self-pay | Admitting: General Surgery

## 2016-10-11 ENCOUNTER — Inpatient Hospital Stay (HOSPITAL_COMMUNITY)
Admission: AD | Admit: 2016-10-11 | Discharge: 2016-10-14 | DRG: 392 | Disposition: A | Discharge status: Home / Self Care | Payer: Medicare Other | Source: Ambulatory Visit | Attending: General Surgery | Admitting: General Surgery

## 2016-10-11 DIAGNOSIS — Z79899 Other long term (current) drug therapy: Secondary | ICD-10-CM

## 2016-10-11 DIAGNOSIS — Z7952 Long term (current) use of systemic steroids: Secondary | ICD-10-CM

## 2016-10-11 DIAGNOSIS — K219 Gastro-esophageal reflux disease without esophagitis: Secondary | ICD-10-CM | POA: Diagnosis present

## 2016-10-11 DIAGNOSIS — Z8249 Family history of ischemic heart disease and other diseases of the circulatory system: Secondary | ICD-10-CM

## 2016-10-11 DIAGNOSIS — E785 Hyperlipidemia, unspecified: Secondary | ICD-10-CM | POA: Diagnosis present

## 2016-10-11 DIAGNOSIS — N739 Female pelvic inflammatory disease, unspecified: Secondary | ICD-10-CM | POA: Diagnosis present

## 2016-10-11 DIAGNOSIS — K5720 Diverticulitis of large intestine with perforation and abscess without bleeding: Principal | ICD-10-CM | POA: Diagnosis present

## 2016-10-11 DIAGNOSIS — Z7982 Long term (current) use of aspirin: Secondary | ICD-10-CM

## 2016-10-11 DIAGNOSIS — Z823 Family history of stroke: Secondary | ICD-10-CM

## 2016-10-11 DIAGNOSIS — N321 Vesicointestinal fistula: Secondary | ICD-10-CM | POA: Diagnosis present

## 2016-10-11 DIAGNOSIS — Z6841 Body Mass Index (BMI) 40.0 and over, adult: Secondary | ICD-10-CM

## 2016-10-11 DIAGNOSIS — I2510 Atherosclerotic heart disease of native coronary artery without angina pectoris: Secondary | ICD-10-CM | POA: Diagnosis present

## 2016-10-11 DIAGNOSIS — G7000 Myasthenia gravis without (acute) exacerbation: Secondary | ICD-10-CM | POA: Diagnosis present

## 2016-10-11 DIAGNOSIS — I480 Paroxysmal atrial fibrillation: Secondary | ICD-10-CM | POA: Diagnosis present

## 2016-10-11 DIAGNOSIS — L270 Generalized skin eruption due to drugs and medicaments taken internally: Secondary | ICD-10-CM | POA: Diagnosis not present

## 2016-10-11 DIAGNOSIS — Z90710 Acquired absence of both cervix and uterus: Secondary | ICD-10-CM

## 2016-10-11 DIAGNOSIS — Z87440 Personal history of urinary (tract) infections: Secondary | ICD-10-CM

## 2016-10-11 DIAGNOSIS — T360X5A Adverse effect of penicillins, initial encounter: Secondary | ICD-10-CM | POA: Diagnosis not present

## 2016-10-11 MED ORDER — PIPERACILLIN-TAZOBACTAM 3.375 G IVPB
3.3750 g | Freq: Three times a day (TID) | INTRAVENOUS | Status: DC
  Administered 2016-10-11 – 2016-10-12 (×3): 3.375 g via INTRAVENOUS
  Filled 2016-10-11 (×5): qty 50

## 2016-10-11 MED ORDER — TRAMADOL HCL 50 MG PO TABS
50.0000 mg | ORAL_TABLET | Freq: Four times a day (QID) | ORAL | Status: DC | PRN
  Administered 2016-10-12 – 2016-10-14 (×3): 50 mg via ORAL
  Filled 2016-10-11 (×3): qty 1

## 2016-10-11 MED ORDER — PREDNISONE 1 MG PO TABS
2.0000 mg | ORAL_TABLET | Freq: Every day | ORAL | Status: DC
  Filled 2016-10-11: qty 2

## 2016-10-11 MED ORDER — HYDROMORPHONE HCL 1 MG/ML IJ SOLN: 0.5000 mg | INTRAMUSCULAR | Status: DC | PRN

## 2016-10-11 MED ORDER — PYRIDOSTIGMINE BROMIDE 60 MG PO TABS
60.0000 mg | ORAL_TABLET | Freq: Three times a day (TID) | ORAL | Status: DC
  Administered 2016-10-11 – 2016-10-14 (×8): 60 mg via ORAL
  Filled 2016-10-11 (×9): qty 1

## 2016-10-11 MED ORDER — DIPHENHYDRAMINE HCL 50 MG/ML IJ SOLN: 12.5000 mg | Freq: Four times a day (QID) | INTRAMUSCULAR | Status: DC | PRN

## 2016-10-11 MED ORDER — ENOXAPARIN SODIUM 150 MG/ML ~~LOC~~ SOLN: 40.0000 mg | SUBCUTANEOUS | Status: DC

## 2016-10-11 MED ORDER — DIPHENHYDRAMINE HCL 12.5 MG/5ML PO ELIX: 12.5000 mg | ORAL_SOLUTION | Freq: Four times a day (QID) | ORAL | Status: DC | PRN

## 2016-10-11 MED ORDER — ONDANSETRON HCL 4 MG/2ML IJ SOLN
4.0000 mg | Freq: Four times a day (QID) | INTRAMUSCULAR | Status: DC | PRN
  Administered 2016-10-13: 4 mg via INTRAVENOUS
  Filled 2016-10-11: qty 2

## 2016-10-11 MED ORDER — ACETAMINOPHEN 325 MG PO TABS: 650.0000 mg | ORAL_TABLET | Freq: Four times a day (QID) | ORAL | Status: DC | PRN

## 2016-10-11 MED ORDER — PREDNISONE 5 MG PO TABS: 5.0000 mg | ORAL_TABLET | Freq: Every day | ORAL | Status: DC

## 2016-10-11 MED ORDER — SODIUM CHLORIDE 0.9 % IV SOLN: 4.0000 mg | Freq: Four times a day (QID) | INTRAVENOUS | Status: DC | PRN

## 2016-10-11 MED ORDER — SODIUM CHLORIDE 0.9 % IV SOLN
INTRAVENOUS | Status: DC
  Administered 2016-10-11: 22:00:00 via INTRAVENOUS
  Administered 2016-10-12: 1000 mL via INTRAVENOUS
  Administered 2016-10-13 – 2016-10-14 (×3): via INTRAVENOUS

## 2016-10-11 MED ORDER — PYRIDOSTIGMINE BROMIDE 60 MG PO TABS
60.0000 mg | ORAL_TABLET | Freq: Four times a day (QID) | ORAL | Status: DC
  Filled 2016-10-11 (×2): qty 1

## 2016-10-11 MED ORDER — ONDANSETRON 4 MG PO TBDP: 4.0000 mg | ORAL_TABLET | Freq: Four times a day (QID) | ORAL | Status: DC | PRN

## 2016-10-11 MED ORDER — METRONIDAZOLE IVPB CUSTOM: 500.0000 mg | Freq: Three times a day (TID) | INTRAVENOUS | Status: DC

## 2016-10-11 MED ORDER — CEFTRIAXONE SODIUM 2 G IJ SOLR: 2.0000 g | INTRAMUSCULAR | Status: DC

## 2016-10-11 MED ORDER — SODIUM CHLORIDE 0.9 % IV SOLN: INTRAVENOUS | Status: DC

## 2016-10-11 MED ORDER — ENOXAPARIN SODIUM 40 MG/0.4ML ~~LOC~~ SOLN: 40.0000 mg | SUBCUTANEOUS | Status: DC

## 2016-10-11 MED ORDER — HYDROMORPHONE HCL 2 MG/ML IJ SOLN: 0.5000 mg | INTRAMUSCULAR | Status: DC | PRN

## 2016-10-11 MED ORDER — DIPHENHYDRAMINE HCL 50 MG/ML IJ SOLN
12.5000 mg | Freq: Four times a day (QID) | INTRAMUSCULAR | Status: DC | PRN
  Administered 2016-10-12: 12.5 mg via INTRAVENOUS
  Filled 2016-10-11: qty 1

## 2016-10-11 MED ORDER — ACETAMINOPHEN 650 MG RE SUPP: 650.0000 mg | Freq: Four times a day (QID) | RECTAL | Status: DC | PRN

## 2016-10-11 MED ORDER — FOLIC ACID 1 MG PO TABS
2.0000 mg | ORAL_TABLET | Freq: Every day | ORAL | Status: DC
  Administered 2016-10-11 – 2016-10-13 (×3): 2 mg via ORAL
  Filled 2016-10-11 (×3): qty 2

## 2016-10-11 MED ORDER — DIPHENHYDRAMINE HCL 12.5 MG/5ML PO ELIX
12.5000 mg | ORAL_SOLUTION | Freq: Four times a day (QID) | ORAL | Status: DC | PRN
  Administered 2016-10-12: 12.5 mg via ORAL
  Filled 2016-10-11: qty 10

## 2016-10-11 NOTE — H&P (Signed)
History of Present Illness Leighton Ruff MD; 76/01/6071 10:46 AM) The patient is a 71 year old female who presents with diverticulitis. 71 year old female with a history of diverticulitis in 2017 who presents to the office for evaluation of recurrent urinary tract infections. She was seen by urologist and CT scan was ordered. This showed concern for colovesicular fistula and a 3.7 cm abscess. Since that time she has been having fevers and chills at home approximately every 2-3 days. She was tried on Augmentin but developed a reaction to this and had to come off. She reports she has never had a colonoscopy. She has been diagnosed with myasthenia gravis gravis since 2011. She has coronary artery disease but has had a negative catheter recently. She has paroxysmal atrial fibrillation but is not anticoagulated currently. She has a history of an open appendectomy, open hysterectomy and open cholecystectomy. She also has a known umbilical hernia containing fat only.   Problem List/Past Medical Leighton Ruff, MD; 71/0/6269 10:44 AM) Nira Conn UMBILICAL HERNIA (S85.4) MYASTHENIA GRAVIS, ADULT FORM (G70.00) ABDOMINAL PAIN IN FEMALE (R10.9)  Allergies Malachy Moan, RMA; 10/11/2016 10:01 AM) Amoxicillin *PENICILLINS* Swelling, Rash.  Medication History Leighton Ruff, MD; 62/07/348 10:44 AM) Calcium 600 + D (600-200MG -UNIT Tablet, Oral) Active. Vitamin D (1000UNIT Tablet, Oral) Active. Losartan Potassium (100MG  Tablet, Oral) Active. Methotrexate Sodium (2.5MG  Tablet, Oral) Active. Multivitamins/Minerals (Oral) Active. Pyridoxine HCl (100MG  Tablet, Oral) Active. Vitamin B-12 (Oral) Specific strength unknown - Active. Monurol (3GM Packet, Oral) Active. Aspirin (81MG  Tablet, Oral) Active. PredniSONE (5MG  Tablet, Oral) Active. Spironolactone (25MG  Tablet, Oral) Active. Furosemide (40MG  Tablet, Oral) Active. Valsartan (320MG  Tablet, Oral) Active. Pyridostigmine Bromide  (60MG  Tablet, Oral) Active. Folic Acid (1MG  Tablet, Oral) Active. Medications Reconciled Keflex (500MG  Capsule, 1 (one) Capsule Oral tid, Taken starting 10/11/2016) Active.  Other Problems Leighton Ruff, MD; 09/13/8180 10:44 AM) PELVIC ABSCESS IN FEMALE (N73.9) Past Surgical History:  Procedure Laterality Date  . APPENDECTOMY     & exploratory for inflammation; Dr Hassell Done  . CARDIAC CATHETERIZATION N/A 04/17/2015   Procedure: Left Heart Cath and Coronary Angiography;  Surgeon: Leonie Man, MD;  Location: [place] [facility];  Service: Cardiovascular;  Laterality: N/A;  . CERVICAL FUSION     C5-6; Dr Vertell Limber  . CHOLECYSTECTOMY     for stones  . LAMINECTOMY  2010   T1-2; Dr Vertell Limber  . SHOULDER SURGERY     Left & Right  . THORACENTESIS     for peripneumonic effusion  . TOTAL ABDOMINAL HYSTERECTOMY W/ BILATERAL SALPINGOOPHORECTOMY     For Fibroids & endometriosis    Past Medical History:  Diagnosis Date  . Arthritis   . Atrial tachycardia ([place]) 05/12/2015  . Diplopia   . Family history of ischemic heart disease   . GERD (gastroesophageal reflux disease)   . Heart murmur   . Myalgia and myositis, unspecified   . Myasthenia gravis without exacerbation (HCC)    [facility], Dr Anna Genre  . NSVT (nonsustained ventricular tachycardia) ([place]) 05/12/2015  . Other and unspecified hyperlipidemia   . Psoriasis    plantar ;Dr Jarome Matin  . Unspecified vitamin D deficiency    Dr Estanislado Pandy  . Vasculitis ([place])    Dr Estanislado Pandy   Social History   Social History  . Marital status: Married    Spouse name: N/A  . Number of children: N/A  . Years of education: N/A   Occupational History  . 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  Smoking status: Never Smoker  . Smokeless tobacco: Never Used  . Alcohol use No  . Drug use: No  . Sexual activity: Not on file   Other Topics Concern  . Not on file   Social History Narrative   Lives with husband in a one story  home.  Had one child that only lived for 14 hours.     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          Epworth Sleepiness Scale = 2 (as of 03/14/2015)   Family History  Problem Relation Age of Onset  . Heart attack Mother 37  . Heart failure Mother   . Stroke Paternal Grandmother        in 21s  . Lung cancer Father   . Cancer Father        lung  . Hypertension Sister   . Hyperthyroidism Sister        [place]  . Hyperthyroidism Sister        [place]  . Diabetes Neg Hx    Review of Systems - Negative except abd pain and dysuria  Vitals Malachy Moan RMA; 10/11/2016 9:55 AM) 10/11/2016 9:54 AM Weight: 280 lb Height: 61in Body Surface Area: 2.18 m Body Mass Index: 52.9 kg/m  Temp.: 97.82F  Pulse: 85 (Regular)  BP: 126/70 (Sitting, Left Arm, Standard)      Physical Exam Leighton Ruff MD; 03/18/486 10:45 AM)  General Mental Status-Alert. General Appearance-Not in acute distress. Build & Nutrition-Well nourished. Posture-Normal posture. Gait-Normal.  Head and Neck Head-normocephalic, atraumatic with no lesions or palpable masses. Trachea-midline.  Chest and Lung Exam Chest and lung exam reveals -on auscultation, normal breath sounds, no adventitious sounds and normal vocal resonance.  Cardiovascular Cardiovascular examination reveals -normal heart sounds, regular rate and rhythm with no murmurs and no digital clubbing, cyanosis, edema, increased warmth or tenderness.  Abdomen Inspection Inspection of the abdomen reveals - No Hernias. Palpation/Percussion Palpation and Percussion of the abdomen reveal - Soft, Non Tender, No Rigidity (guarding), No hepatosplenomegaly and No Palpable abdominal masses.  Neurologic Neurologic evaluation reveals -alert and oriented x 3 with no impairment of recent or remote memory, normal attention span and ability to concentrate, normal sensation and normal  coordination.  Musculoskeletal Normal Exam - Bilateral-Upper Extremity Strength Normal and Lower Extremity Strength Normal.    Assessment & Plan Leighton Ruff MD; 89/01/6943 10:46 AM)  PELVIC ABSCESS IN FEMALE (N73.9) Impression: 71 year old female with myasthenia gravis on immunosuppression who presents to the office with a CT scan concerning for colovesicular fistula and 3.7 cm abscess. The patient states she is not on antibiotics due to several allergies. She does report fevers and chills every 2-3 days. It looks like she has taken Keflex in the recent past and had a good response to this. It is not ideal for a UTI but hopefully it will help calm her symptoms. I would like for her to get admitted to the hospital and a drain placed by interventional radiology. After that we will work on getting her a colonoscopy and discuss whether or not she needs any further surgery.  Current Plans Started Keflex 500MG , 1 (one) Capsule tid, #45, 30 days starting 10/11/2016, Ref. x1.

## 2016-10-11 NOTE — Progress Notes (Signed)
Patient came from home accompanied bu her husband in room [phone] via wheelchair; alert and oriented x 4;  complaints of abdominal pain;  Orient patient to room and unit; gave patient care guide; instructed how to use the call bell and  fall risk precautions. [facility] called to informed that the patient arrived on the unit and we need new orders. Will continue to monitor the patient.

## 2016-10-11 NOTE — H&P (Deleted)
  The note originally documented on this encounter has been moved the the encounter in which it belongs.  

## 2016-10-12 ENCOUNTER — Observation Stay (HOSPITAL_COMMUNITY): Payer: Medicare Other

## 2016-10-12 DIAGNOSIS — K5732 Diverticulitis of large intestine without perforation or abscess without bleeding: Secondary | ICD-10-CM | POA: Diagnosis not present

## 2016-10-12 DIAGNOSIS — Z90710 Acquired absence of both cervix and uterus: Secondary | ICD-10-CM | POA: Diagnosis not present

## 2016-10-12 DIAGNOSIS — Z823 Family history of stroke: Secondary | ICD-10-CM | POA: Diagnosis not present

## 2016-10-12 DIAGNOSIS — K632 Fistula of intestine: Secondary | ICD-10-CM | POA: Diagnosis not present

## 2016-10-12 DIAGNOSIS — Z87440 Personal history of urinary (tract) infections: Secondary | ICD-10-CM | POA: Diagnosis not present

## 2016-10-12 DIAGNOSIS — G7000 Myasthenia gravis without (acute) exacerbation: Secondary | ICD-10-CM | POA: Diagnosis present

## 2016-10-12 DIAGNOSIS — E785 Hyperlipidemia, unspecified: Secondary | ICD-10-CM | POA: Diagnosis present

## 2016-10-12 DIAGNOSIS — R109 Unspecified abdominal pain: Secondary | ICD-10-CM | POA: Diagnosis not present

## 2016-10-12 DIAGNOSIS — I2510 Atherosclerotic heart disease of native coronary artery without angina pectoris: Secondary | ICD-10-CM | POA: Diagnosis present

## 2016-10-12 DIAGNOSIS — N321 Vesicointestinal fistula: Secondary | ICD-10-CM | POA: Diagnosis present

## 2016-10-12 DIAGNOSIS — Z8249 Family history of ischemic heart disease and other diseases of the circulatory system: Secondary | ICD-10-CM | POA: Diagnosis not present

## 2016-10-12 DIAGNOSIS — Z7952 Long term (current) use of systemic steroids: Secondary | ICD-10-CM | POA: Diagnosis not present

## 2016-10-12 DIAGNOSIS — T360X5A Adverse effect of penicillins, initial encounter: Secondary | ICD-10-CM | POA: Diagnosis not present

## 2016-10-12 DIAGNOSIS — Z6841 Body Mass Index (BMI) 40.0 and over, adult: Secondary | ICD-10-CM | POA: Diagnosis not present

## 2016-10-12 DIAGNOSIS — K5120 Ulcerative (chronic) proctitis without complications: Secondary | ICD-10-CM | POA: Diagnosis not present

## 2016-10-12 DIAGNOSIS — K219 Gastro-esophageal reflux disease without esophagitis: Secondary | ICD-10-CM | POA: Diagnosis present

## 2016-10-12 DIAGNOSIS — L270 Generalized skin eruption due to drugs and medicaments taken internally: Secondary | ICD-10-CM | POA: Diagnosis not present

## 2016-10-12 DIAGNOSIS — Z79899 Other long term (current) drug therapy: Secondary | ICD-10-CM | POA: Diagnosis not present

## 2016-10-12 DIAGNOSIS — Z7982 Long term (current) use of aspirin: Secondary | ICD-10-CM | POA: Diagnosis not present

## 2016-10-12 DIAGNOSIS — I480 Paroxysmal atrial fibrillation: Secondary | ICD-10-CM | POA: Diagnosis present

## 2016-10-12 DIAGNOSIS — K5720 Diverticulitis of large intestine with perforation and abscess without bleeding: Secondary | ICD-10-CM | POA: Diagnosis present

## 2016-10-12 DIAGNOSIS — N739 Female pelvic inflammatory disease, unspecified: Secondary | ICD-10-CM | POA: Diagnosis not present

## 2016-10-12 LAB — COMPREHENSIVE METABOLIC PANEL
ALBUMIN: 3.1 g/dL — AB (ref 3.5–5.0)
ALT: 26 U/L (ref 14–54)
AST: 30 U/L (ref 15–41)
Alkaline Phosphatase: 54 U/L (ref 38–126)
Anion gap: 9 (ref 5–15)
BILIRUBIN TOTAL: 0.7 mg/dL (ref 0.3–1.2)
BUN: 17 mg/dL (ref 6–20)
CO2: 27 mmol/L (ref 22–32)
Calcium: 8.8 mg/dL — ABNORMAL LOW (ref 8.9–10.3)
Chloride: 102 mmol/L (ref 101–111)
Creatinine, Ser: 0.94 mg/dL (ref 0.44–1.00)
GLUCOSE: 94 mg/dL (ref 65–99)
POTASSIUM: 3.8 mmol/L (ref 3.5–5.1)
Sodium: 138 mmol/L (ref 135–145)
TOTAL PROTEIN: 6.5 g/dL (ref 6.5–8.1)

## 2016-10-12 LAB — CBC
HCT: 31.3 % — ABNORMAL LOW (ref 36.0–46.0)
Hemoglobin: 9.8 g/dL — ABNORMAL LOW (ref 12.0–15.0)
MCH: 28.7 pg (ref 26.0–34.0)
MCHC: 31.3 g/dL (ref 30.0–36.0)
MCV: 91.5 fL (ref 78.0–100.0)
PLATELETS: 351 10*3/uL (ref 150–400)
RBC: 3.42 MIL/uL — ABNORMAL LOW (ref 3.87–5.11)
RDW: 15.3 % (ref 11.5–15.5)
WBC: 10.4 10*3/uL (ref 4.0–10.5)

## 2016-10-12 LAB — PROTIME-INR
INR: 0.98
Prothrombin Time: 12.9 seconds (ref 11.4–15.2)

## 2016-10-12 LAB — APTT: APTT: 27 s (ref 24–36)

## 2016-10-12 MED ORDER — PREDNISONE 5 MG PO TABS
7.0000 mg | ORAL_TABLET | Freq: Every day | ORAL | Status: DC
  Administered 2016-10-12 – 2016-10-14 (×3): 7 mg via ORAL
  Filled 2016-10-12 (×3): qty 2

## 2016-10-12 MED ORDER — ENOXAPARIN SODIUM 40 MG/0.4ML ~~LOC~~ SOLN
40.0000 mg | SUBCUTANEOUS | Status: DC
  Administered 2016-10-13: 40 mg via SUBCUTANEOUS
  Filled 2016-10-12: qty 0.4

## 2016-10-12 MED ORDER — IOPAMIDOL (ISOVUE-300) INJECTION 61%
INTRAVENOUS | Status: AC
  Administered 2016-10-12: 30 mL
  Filled 2016-10-12: qty 100

## 2016-10-12 MED ORDER — SPIRONOLACTONE 25 MG PO TABS
25.0000 mg | ORAL_TABLET | Freq: Every day | ORAL | Status: DC
  Administered 2016-10-12 – 2016-10-14 (×3): 25 mg via ORAL
  Filled 2016-10-12 (×3): qty 1

## 2016-10-12 MED ORDER — SODIUM CHLORIDE 0.9 % IV SOLN
1.0000 g | INTRAVENOUS | Status: DC
  Administered 2016-10-12: 1 g via INTRAVENOUS
  Filled 2016-10-12 (×2): qty 1

## 2016-10-12 MED ORDER — FUROSEMIDE 40 MG PO TABS
40.0000 mg | ORAL_TABLET | Freq: Every day | ORAL | Status: DC
  Administered 2016-10-12 – 2016-10-14 (×3): 40 mg via ORAL
  Filled 2016-10-12 (×3): qty 1

## 2016-10-12 MED ORDER — ERTAPENEM SODIUM 1 G IJ SOLR
1.0000 g | INTRAMUSCULAR | Status: DC
  Filled 2016-10-12: qty 1

## 2016-10-12 MED ORDER — PREDNISOLONE 5 MG PO TABS: 7.0000 mg | ORAL_TABLET | Freq: Every day | ORAL | Status: DC

## 2016-10-12 MED ORDER — IOPAMIDOL (ISOVUE-300) INJECTION 61%
INTRAVENOUS | Status: AC
  Administered 2016-10-12: 100 mL
  Filled 2016-10-12: qty 30

## 2016-10-12 MED ORDER — LOSARTAN POTASSIUM 50 MG PO TABS
100.0000 mg | ORAL_TABLET | Freq: Every day | ORAL | Status: DC
  Administered 2016-10-12 – 2016-10-14 (×3): 100 mg via ORAL
  Filled 2016-10-12 (×3): qty 2

## 2016-10-12 MED ORDER — DIPHENHYDRAMINE HCL 50 MG/ML IJ SOLN
12.5000 mg | INTRAMUSCULAR | Status: DC | PRN
  Administered 2016-10-12: 12.5 mg via INTRAVENOUS
  Filled 2016-10-12: qty 1

## 2016-10-12 NOTE — Progress Notes (Signed)
IR aware of request for aspiration and drain placement.   Patient underwent CT scan this afternoon.  Will discuss results with MD.   Patient NPO after midnight tonight and hold blood thinners.   Brynda Greathouse, MS RD PA-C

## 2016-10-12 NOTE — Progress Notes (Signed)
Nutrition Brief Note  Patient identified on the Malnutrition Screening Tool (MST) Report  Wt Readings from Last 15 Encounters:  10/11/16 277 lb (125.6 kg)  09/16/16 272 lb 9.6 oz (123.7 kg)  08/20/16 277 lb (125.6 kg)  08/09/16 277 lb 4 oz (125.8 kg)  05/21/16 275 lb (124.7 kg)  05/08/16 271 lb 14.4 oz (123.3 kg)  04/15/16 275 lb (124.7 kg)  05/12/15 259 lb (117.5 kg)  05/09/15 259 lb (117.5 kg)  04/17/15 258 lb (117 kg)  04/11/15 259 lb (117.5 kg)  03/27/15 263 lb (119.3 kg)  03/14/15 263 lb 6 oz (119.5 kg)  02/28/15 263 lb 9.6 oz (119.6 kg)  02/17/15 265 lb 6.4 oz (43.47 kg)   71 year old female with myasthenia gravis on immunosuppression who presents to the office with a CT scan concerning for colovesicular fistula and 3.7 cm abscess  Pt being transported for procedure at time of visit. Plan for CT prior to drain placement, per general surgery notes.   Wt has been stable.   Pt with no observed fat and muscle depletion.   Body mass index is 50.66 kg/m. Patient meets criteria for extreme obesity, class [name] based on current BMI.   Current diet order is NPO, patient is consuming approximately n/a% of meals at this time. Labs and medications reviewed.   No nutrition interventions warranted at this time. If nutrition issues arise, please consult RD.   Sinthia Karabin A. Jimmye Norman, RD, LDN, CDE Pager: [phone] After hours Pager: [phone]

## 2016-10-12 NOTE — Progress Notes (Signed)
RN called [facility] and informed MD on-call for Dr. Donne Hazel that the patient voided twice this evening and urine had a green color. No new orders at this time, on call doctor said that she will call staff back with new orders. Patient is not complaining of any pain or others discomfort. Will continue to monitor.

## 2016-10-12 NOTE — Progress Notes (Signed)
Patient ID: Kimberly [name], female   DOB: [birth year], 71 y.o.   MRN: [id number]   IR aware of request for abscess drain placement Imaging seen was dated 09/27/16  Will order CT Abd/Pel with Cx  (done per Dr Donne Hazel)  Await new imaging and will make recommendation then

## 2016-10-12 NOTE — Progress Notes (Signed)
  Subjective/Chief Complaint: No complaints today, wants to know plan   Objective: Vital signs in last 24 hours: Temp:  [97.5 F (36.4 C)-99 F (37.2 C)] 97.5 F (36.4 C) (10/02 0518) Pulse Rate:  [68-82] 71 (10/02 0518) Resp:  [18] 18 (10/02 0518) BP: (102-125)/(45-82) 122/55 (10/02 0518) SpO2:  [98 %] 98 % (10/02 0518) Weight:  [125.6 kg (277 lb)] 125.6 kg (277 lb) (10/01 2103) Last BM Date: 10/11/16  Intake/Output from previous day: 10/01 0701 - 10/02 0700 In: 715 [P.O.:120; I.V.:545; IV Piggyback:50] Out: 200 [Urine:200] Intake/Output this shift: Total I/O In: 60 [P.O.:60] Out: -   General appearance: no distress Resp: clear to auscultation bilaterally Cardio: regular rate and rhythm GI: soft nt/nd bs present  Lab Results:   Recent Labs  10/12/16 0449  WBC 10.4  HGB 9.8*  HCT 31.3*  PLT 351   BMET  Recent Labs  10/12/16 0449  NA 138  K 3.8  CL 102  CO2 27  GLUCOSE 94  BUN 17  CREATININE 0.94  CALCIUM 8.8*   PT/INR  Recent Labs  10/12/16 0449  LABPROT 12.9  INR 0.98    Anti-infectives: Anti-infectives    Start     Dose/Rate Route Frequency Ordered Stop   10/11/16 1900  piperacillin-tazobactam (ZOSYN) IVPB 3.375 g     3.375 g 12.5 mL/hr over 240 Minutes Intravenous Every 8 hours 10/11/16 1847        Assessment/Plan: Diverticular disease with abscess on ct 17 September Likely colovesicle fistula  Needs repeat ct per radiology today before consideration of drainage Remain npo on abx for now I have restarted all home bp meds and mg meds as well Hopefully will have drain and then be discharged with follow up  [facility] 10/12/2016

## 2016-10-12 NOTE — Progress Notes (Signed)
Notified MD that rash spread to flank and back. No new itching, no shortness of breath noted. Invanz discontinued, Benadryl changed to Q4 hrs PRN.

## 2016-10-12 NOTE — Care Management Obs Status (Signed)
[facility] NOTIFICATION   Patient Details  Name: Kimberly [name] MRN: [id number] Date of Birth: Feb 08, 1945   Medicare Observation Status Notification Given:  Yes    Carles Collet, RN 10/12/2016, 12:12 PM

## 2016-10-12 NOTE — Progress Notes (Signed)
Notified On-Call MD that Pt has rash to face and neck. No new itching, No shortness of breath noted.  IV Zosyn discontinued and IV Invanz ordered.

## 2016-10-13 ENCOUNTER — Inpatient Hospital Stay (HOSPITAL_COMMUNITY): Payer: Medicare Other

## 2016-10-13 ENCOUNTER — Encounter (HOSPITAL_COMMUNITY): Payer: Self-pay | Admitting: General Surgery

## 2016-10-13 MED ORDER — LIDOCAINE HCL 1 % IJ SOLN
INTRAMUSCULAR | Status: AC
  Filled 2016-10-13: qty 20

## 2016-10-13 MED ORDER — MIDAZOLAM HCL 2 MG/2ML IJ SOLN
INTRAMUSCULAR | Status: AC | PRN
  Administered 2016-10-13: 1 mg via INTRAVENOUS
  Administered 2016-10-13 (×2): 0.5 mg via INTRAVENOUS

## 2016-10-13 MED ORDER — MIDAZOLAM HCL 2 MG/2ML IJ SOLN
INTRAMUSCULAR | Status: AC
  Filled 2016-10-13: qty 2

## 2016-10-13 MED ORDER — FENTANYL CITRATE (PF) 100 MCG/2ML IJ SOLN
INTRAMUSCULAR | Status: AC | PRN
  Administered 2016-10-13 (×2): 50 ug via INTRAVENOUS
  Administered 2016-10-13 (×2): 25 ug via INTRAVENOUS

## 2016-10-13 MED ORDER — FENTANYL CITRATE (PF) 100 MCG/2ML IJ SOLN
INTRAMUSCULAR | Status: AC
  Filled 2016-10-13: qty 2

## 2016-10-13 MED ORDER — SODIUM CHLORIDE 0.9% FLUSH
10.0000 mL | Freq: Three times a day (TID) | INTRAVENOUS | Status: DC
  Administered 2016-10-14: 10 mL via INTRAVENOUS

## 2016-10-13 NOTE — Sedation Documentation (Signed)
Patient is resting comfortably. 

## 2016-10-13 NOTE — Discharge Instructions (Signed)
[facility] Instructions  1. DIET: Follow a light bland diet the first 24 hours after arrival home, such as soup, liquids, crackers, etc.  Be sure to include lots of fluids daily.  Avoid fast food or heavy meals as your are more likely to get nauseated.   2.  Resume any blood thinners 2 days after your procedure unless directed otherwise by your physician. 3. Take your usually prescribed home medications unless otherwise directed. a. If you have any pain, it is helpful to get up and walk around, as it is usually from excess gas. b. If this is not helpful, you can take an over-the-counter pain medication.  Choose one of the following that works best for you: i. Naproxen (Aleve, etc)  Two 220mg  tabs twice a day ii. Ibuprofen (Advil, etc) Three 200mg  tabs four times a day (every meal & bedtime) iii. If you still have pain after using one of these, please call the office  4. ACTIVITIES as tolerated:   5. You may resume regular (light) daily activities beginning the next day--such as daily self-care, walking, climbing stairs--gradually increasing activities as tolerated.  6.        Follow drain care instructions per radiology dept  WHEN TO CALL us [phone]: 1. Fever over 101.5 F (38.5 C)  2. Severe abdominal or chest pain  3. Large amount of rectal bleeding, passing multiple blood clots  4. Dizziness or shortness of breath 5. Increasing nausea or vomiting   The clinic staff is available to answer your questions during regular business hours (8:30am-5pm).  Please dont hesitate to call and ask to speak to one of our nurses for clinical concerns.   If you have a medical emergency, go to the nearest emergency room or call 911.  A surgeon from [facility] is always on call at the [facility], [place], [place], [place], [place]  [address] ? MAIN: [phone] ? TOLL FREE: [phone] ?  FAX [phone]  [phone] [email or web address]

## 2016-10-13 NOTE — Progress Notes (Signed)
Subjective Rash overnight attributed to zosyn; switched to ertapenem. No further issues, rash has resolved. Bilateral LQ pain stable/slightly improved. Denies any other complaints.  Objective: Vital signs in last 24 hours: Temp:  [97.9 F (36.6 C)-98.8 F (37.1 C)] 98.8 F (37.1 C) (10/03 0510) Pulse Rate:  [73-96] 96 (10/03 0510) Resp:  [18-24] 19 (10/03 0510) BP: (98-122)/(41-58) 98/41 (10/03 0510) SpO2:  [93 %-98 %] 93 % (10/03 0510) Last BM Date: 10/12/16  Intake/Output from previous day: 10/02 0701 - 10/03 0700 In: 1748.8 [P.O.:150; I.V.:1498.8; IV Piggyback:100] Out: -  Intake/Output this shift: No intake/output data recorded.  Gen: NAD, comfortable CV: RRR Pulm: Normal work of breathing Abd: Obese, soft, NT Ext: SCDs in place  Lab Results: CBC   Recent Labs  10/12/16 0449  WBC 10.4  HGB 9.8*  HCT 31.3*  PLT 351   BMET  Recent Labs  10/12/16 0449  NA 138  K 3.8  CL 102  CO2 27  GLUCOSE 94  BUN 17  CREATININE 0.94  CALCIUM 8.8*   PT/INR  Recent Labs  10/12/16 0449  LABPROT 12.9  INR 0.98   ABG No results for input(s): PHART, HCO3 in the last 72 hours.  Invalid input(s): PCO2, PO2  Studies/Results:  Anti-infectives: Anti-infectives    Start     Dose/Rate Route Frequency Ordered Stop   10/12/16 2200  ertapenem (INVANZ) 1 g in sodium chloride 0.9 % 50 mL IVPB  Status:  Discontinued     1 g 100 mL/hr over 30 Minutes Intravenous Every 24 hours 10/12/16 2139 10/12/16 2141   10/12/16 2200  ertapenem (INVANZ) 1 g in sodium chloride 0.9 % 50 mL IVPB     1 g 100 mL/hr over 30 Minutes Intravenous Every 24 hours 10/12/16 2141     10/11/16 1900  piperacillin-tazobactam (ZOSYN) IVPB 3.375 g  Status:  Discontinued     3.375 g 12.5 mL/hr over 240 Minutes Intravenous Every 8 hours 10/11/16 1847 10/12/16 2139     Assessment/Plan:  Colovesical fistula with associated abscess between bladder and colon; HD#2  -IR today for attempted  drainage -Continue NPO/IV abx until procedure - following, will plan to D/C abx and advance diet as tolerated   LOS: 2 days   Ileana Roup, MD [facility]

## 2016-10-13 NOTE — Consult Note (Signed)
Chief Complaint: pelvic abscess secondary to diverticulitis with colovesical fistula  Referring Physician:Dr. Rolm Bookbinder  Supervising Physician: Aletta Edouard  Patient Status: [facility] - In-pt  HPI: Kimberly [name] is a 71 y.o. female with a history of recurrent UTIs who is followed by urology.  She saw her urologist about a week ago complaining of pneumaturia and some sedimentation in her urine.  He ordered a CT scan, which I don't have the results too, but must've showed some concern for colovesical fistula associated with thickening of her colon as she was referred to Dr. Leighton Ruff.  She was seen this past Monday and after evaluation was directly admitted to [facility].  She had a repeat CT scan yesterday that confirmed a colovesical fistula between the sigmoid colon and left anterior superior bladder wall with not much change since the prior scan.  She has a collection that measures 5x2cm.  She has been placed on IV abx therapy and IR has been asked to place a drain to temporize her until she can have an operation where her inflammation has had a chance to settle down.  Past Medical History:  Past Medical History:  Diagnosis Date  . Arthritis   . Atrial tachycardia ([place]) 05/12/2015  . Diplopia   . Family history of ischemic heart disease   . GERD (gastroesophageal reflux disease)   . Heart murmur   . Myalgia and myositis, unspecified   . Myasthenia gravis without exacerbation ([facility])    [facility], Dr Anna Genre  . NSVT (nonsustained ventricular tachycardia) ([place]) 05/12/2015  . Other and unspecified hyperlipidemia   . Psoriasis    plantar ;Dr Jarome Matin  . Unspecified vitamin D deficiency    Dr Estanislado Pandy  . Vasculitis ([place])    Dr Estanislado Pandy    Past Surgical History:  Past Surgical History:  Procedure Laterality Date  . APPENDECTOMY     & exploratory for inflammation; Dr Hassell Done  . CARDIAC CATHETERIZATION N/A 04/17/2015   Procedure: Left Heart Cath and Coronary Angiography;   Surgeon: Leonie Man, MD;  Location: [facility];  Service: Cardiovascular;  Laterality: N/A;  . CERVICAL FUSION     C5-6; Dr Vertell Limber  . CHOLECYSTECTOMY     for stones  . LAMINECTOMY  2010   T1-2; Dr Vertell Limber  . SHOULDER SURGERY     Left & Right  . THORACENTESIS     for peripneumonic effusion  . TOTAL ABDOMINAL HYSTERECTOMY W/ BILATERAL SALPINGOOPHORECTOMY     For Fibroids & endometriosis    Family History:  Family History  Problem Relation Age of Onset  . Heart attack Mother 86  . Heart failure Mother   . Stroke Paternal Grandmother        in 11s  . Lung cancer Father   . Cancer Father        lung  . Hypertension Sister   . Hyperthyroidism Sister        Gra[place]  . Hyperthyroidism Sister        Graves  . Diabetes Neg Hx     Social History:  reports that she has never smoked. She has never used smokeless tobacco. She reports that she does not drink alcohol or use drugs.  Allergies:  Allergies  Allergen Reactions  . Azithromycin Other (See Comments)    Angioedema  . Levaquin [Levofloxacin In D5w] Other (See Comments)    D/T MYASTHENIA GRAVIS  . Tetracycline Rash  . Sulfonamide Derivatives     Seizure age  4 (Note: probably fever induced)  . Aleve [Naproxen] Other (See Comments)    Caused excessive salivation  . Amlodipine Besy-Benazepril Hcl Cough  . Amoxicillin Swelling and Rash    Has patient had a PCN reaction causing immediate rash, facial/tongue/throat swelling, SOB or lightheadedness with hypotension: No Has patient had a PCN reaction causing severe rash involving mucus membranes or skin necrosis: No Has patient had a PCN reaction that required hospitalization: No Has patient had a PCN reaction occurring within the last 10 years: Yes If all of the above answers are "NO", then may proceed with Cephalosporin use.     Medications: Medications reviewed in epic  Please HPI for pertinent positives, otherwise complete 10 system ROS negative.  Mallampati  Score: MD Evaluation Airway: WNL Heart: WNL Abdomen: Other (comments) Abdomen comments: umbilical hernia, tender in LLQ Chest/ Lungs: WNL ASA  Classification: 3 Mallampati/Airway Score: Two  Physical Exam: BP (!) 120/58 (BP Location: Right Arm)   Pulse 96   Temp 98.8 F (37.1 C) (Oral)   Resp 19   Ht '5\' 2"'  (1.575 m)   Wt 277 lb (125.6 kg)   SpO2 93%   BMI 50.66 kg/m  Body mass index is 50.66 kg/m. General: pleasant, WD, WN, morbidly obese, white female who is laying in bed in NAD HEENT: head is normocephalic, atraumatic.  Sclera are noninjected.  PERRL.  Ears and nose without any masses or lesions.  Mouth is pink and moist Heart: regular, rate, and rhythm.  Normal s1,s2. No obvious murmurs, gallops, or rubs noted.  Palpable radial pulses bilaterally Lungs: CTAB, no wheezes, rhonchi, or rales noted.  Respiratory effort nonlabored Abd: soft, mildly tender in the LLQ, ND, +BS, no masses or organomegaly.  Reducible umbilical hernia Psych: A&Ox3 with an appropriate affect.   Labs: Results for orders placed or performed during the hospital encounter of 10/11/16 (from the past 48 hour(s))  Comprehensive metabolic panel     Status: Abnormal   Collection Time: 10/12/16  4:49 AM  Result Value Ref Range   Sodium 138 135 - 145 mmol/L   Potassium 3.8 3.5 - 5.1 mmol/L   Chloride 102 101 - 111 mmol/L   CO2 27 22 - 32 mmol/L   Glucose, Bld 94 65 - 99 mg/dL   BUN 17 6 - 20 mg/dL   Creatinine, Ser 0.94 0.44 - 1.00 mg/dL   Calcium 8.8 (L) 8.9 - 10.3 mg/dL   Total Protein 6.5 6.5 - 8.1 g/dL   Albumin 3.1 (L) 3.5 - 5.0 g/dL   AST 30 15 - 41 U/L   ALT 26 14 - 54 U/L   Alkaline Phosphatase 54 38 - 126 U/L   Total Bilirubin 0.7 0.3 - 1.2 mg/dL   GFR calc non Af Amer >60 >60 mL/min   GFR calc Af Amer >60 >60 mL/min    Comment: (NOTE) The eGFR has been calculated using the CKD EPI equation. This calculation has not been validated in all clinical situations. eGFR's persistently <60 mL/min  signify possible Chronic Kidney Disease.    Anion gap 9 5 - 15  CBC     Status: Abnormal   Collection Time: 10/12/16  4:49 AM  Result Value Ref Range   WBC 10.4 4.0 - 10.5 K/uL   RBC 3.42 (L) 3.87 - 5.11 MIL/uL   Hemoglobin 9.8 (L) 12.0 - 15.0 g/dL   HCT 31.3 (L) 36.0 - 46.0 %   MCV 91.5 78.0 - 100.0 fL   MCH 28.7 26.0 -  34.0 pg   MCHC 31.3 30.0 - 36.0 g/dL   RDW 15.3 11.5 - 15.5 %   Platelets 351 150 - 400 K/uL  APTT     Status: None   Collection Time: 10/12/16  4:49 AM  Result Value Ref Range   aPTT 27 24 - 36 seconds  Protime-INR     Status: None   Collection Time: 10/12/16  4:49 AM  Result Value Ref Range   Prothrombin Time 12.9 11.4 - 15.2 seconds   INR 0.98     Imaging: Ct Abdomen Pelvis W Contrast  Result Date: 10/12/2016 CLINICAL DATA:  Inpatient. Abdominal pain. Recent CT demonstrated diverticular disease with colovesical fistula. EXAM: CT ABDOMEN AND PELVIS WITH CONTRAST TECHNIQUE: Multidetector CT imaging of the abdomen and pelvis was performed using the standard protocol following bolus administration of intravenous contrast. CONTRAST:  60m ISOVUE-300 IOPAMIDOL (ISOVUE-300) INJECTION 61%, 1073mISOVUE-300 IOPAMIDOL (ISOVUE-300) INJECTION 61% COMPARISON:  09/27/2016 CT abdomen/ pelvis. FINDINGS: Lower chest: No significant pulmonary nodules or acute consolidative airspace disease. Hepatobiliary: Normal liver size. No liver masses. Cholecystectomy. Bile ducts are stable and within normal post cholecystectomy limits. Pancreas: Normal, with no mass or duct dilation. Spleen: Normal size. No mass. Adrenals/Urinary Tract: Normal adrenals. Normal kidneys with no hydronephrosis and no renal mass. Gas is present in the nondependent bladder. Stable thickening of the anterior superior left bladder wall with associated perivesical fat haziness. Stomach/Bowel: Small hiatal hernia. Otherwise grossly normal nondistended stomach. Normal caliber small bowel with no small bowel wall  thickening. Appendectomy. Moderate colonic diverticulosis, most prominent in the sigmoid colon. Mild-to-moderate wall thickening throughout the sigmoid colon. There is a gas and fluid containing colovesical fistula between the proximal sigmoid colon and left anterior superior bladder wall measuring approximately 5.0 cm in length and 2.0 cm in diameter (series 6/image 69), not appreciably changed since 09/27/2016. Oral contrast is not visualized within the fistula tract. Vascular/Lymphatic: Atherosclerotic nonaneurysmal abdominal aorta. Patent portal, splenic, hepatic and renal veins. No pathologically enlarged lymph nodes in the abdomen or pelvis. Reproductive: Status post hysterectomy, with no abnormal findings at the vaginal cuff. No adnexal mass. Other: No ascites. Stable moderate fat containing periumbilical hernia. Musculoskeletal: No aggressive appearing focal osseous lesions. Marked thoracolumbar spondylosis. IMPRESSION: 1. Colovesical fistula between the proximal sigmoid colon and left anterior superior bladder wall, not appreciably changed since 09/27/2016 CT abdomen/pelvis study. Gas is present in the bladder, suggesting patency of the colovesical fistula tract. 2. Moderate colonic diverticulosis. Chronic diffuse sigmoid colonic wall thickening, probably due to chronic diverticular disease. 3. Additional findings include small hiatal hernia, moderate fat containing periumbilical hernia and Aortic Atherosclerosis (ICD10-I70.0). Electronically Signed   By: JaIlona Sorrel.D.   On: 10/12/2016 13:46    Assessment/Plan 1. Colovesical fistula with pelvic fluid collection  We will plan to place a drain in this collection today.  The patient is aware that this drain will then likely stay in place until she has an operation in a couple of months.  She is NPO and all of her labs and vitals have been reviewed.  Risks and benefits discussed with the patient including bleeding, infection, damage to adjacent  structures, bowel perforation/fistula connection, and sepsis. All of the patient's questions were answered, patient is agreeable to proceed. Consent signed and in chart.  Thank you for this interesting consult.  I greatly enjoyed meeting SyAINA ROSSBACHnd look forward to participating in their care.  A copy of this report was sent to the requesting provider on  this date.  Electronically Signed: Henreitta Cea 10/13/2016, 9:16 AM   I spent a total of 40 Minutes    in face to face in clinical consultation, greater than 50% of which was counseling/coordinating care for colovesical fistula with pelvic abscess

## 2016-10-13 NOTE — Procedures (Signed)
Interventional Radiology Procedure Note  Procedure: CT guided percutaneous abscess drain placement  Complications: None  Estimated Blood Loss: < 10 mL  10 Fr drain placed in focal abscess between sigmoid colon and bladder.  Guidewire and drain initially advanced into bladder but able to be pulled back into abscess cavity.  Return of fluid that appears to be consistent with urine.  Venetia Night. Kathlene Cote, M.D Pager:  [phone]

## 2016-10-14 MED ORDER — SODIUM CHLORIDE 0.9% FLUSH: 10.0000 mL | Freq: Every day | INTRAVENOUS | 0 refills | Status: DC

## 2016-10-14 MED ORDER — ACETAMINOPHEN 325 MG PO TABS: 650.0000 mg | ORAL_TABLET | Freq: Four times a day (QID) | ORAL | Status: DC | PRN

## 2016-10-14 MED ORDER — DIPHENHYDRAMINE HCL 50 MG/ML IJ SOLN: 12.5000 mg | INTRAMUSCULAR | Status: DC | PRN

## 2016-10-14 MED ORDER — TRAMADOL HCL 50 MG PO TABS: 50.0000 mg | ORAL_TABLET | Freq: Two times a day (BID) | ORAL | 0 refills | Status: DC | PRN

## 2016-10-14 NOTE — Progress Notes (Signed)
Referring Physician(s): Dr. Rolm Bookbinder  Supervising Physician: Daryll Brod  Patient Status:  [facility] - In-pt  Chief Complaint:  Sigmoid diverticulitis complicated by colovesical fistula between the sigmoid colon and bladder lumen and focal abscess. S/P CT guided drain placement by Dr. Kathlene Cote  Subjective:  Kimberly [name] is doing well this morning. She c/o a little pain at the drain site which is relieved with Tramadol.  Allergies: Azithromycin; Levaquin [levofloxacin in d5w]; Tetracycline; Sulfonamide derivatives; Aleve [naproxen]; Amlodipine besy-benazepril hcl; and Amoxicillin  Medications: Prior to Admission medications   Medication Sig Start Date End Date Taking? Authorizing Provider  aspirin EC 81 MG tablet Take 81 mg by mouth at bedtime.   Yes [provider]  augmented betamethasone dipropionate (DIPROLENE-AF) 0.05 % ointment Apply 1 application topically daily. For vasculitis and psoriasis   Yes [provider]  Calcium Carb-Cholecalciferol (CALCIUM 600-D PO) Take 600 mg by mouth at bedtime.   Yes [provider]  cholecalciferol (VITAMIN D) 1000 units tablet Take 1,000 Units by mouth at bedtime.   Yes [provider]  Cyanocobalamin (VITAMIN B-12 PO) Take 1 tablet by mouth at bedtime.   Yes [provider]  folic acid (FOLVITE) 1 MG tablet TAKE 2 TABLETS BY MOUTH ONCE DAILY 05/25/16  Yes Deveshwar, Abel Presto, MD  furosemide (LASIX) 40 MG tablet Take 1 tablet (40 mg total) by mouth daily. 05/21/16  Yes Burns, Claudina Lick, MD  losartan (COZAAR) 100 MG tablet Take 1 tablet (100 mg total) by mouth daily. 08/30/16  Yes Burns, Claudina Lick, MD  methotrexate (RHEUMATREX) 2.5 MG tablet TAKE 8 TABLETS EVERY WEEK (DO NOT USE UNTIL DONE WITH ANTIBIOTICS & WELL FROM ILLNESS) 09/07/16  Yes Deveshwar, Abel Presto, MD  Multiple Vitamin (MULTIVITAMIN WITH MINERALS) TABS tablet Take 1 tablet by mouth at bedtime.   Yes [provider]  predniSONE  (DELTASONE) 1 MG tablet Take 2 tablets (2 mg total) by mouth daily with breakfast. Take with a 5 mg tablet for a 7 mg dose 06/25/16  Yes Panwala, Naitik, PA-C  pyridostigmine (MESTINON) 60 MG tablet Take 1 tablet (60 mg total) by mouth 3 (three) times daily. 08/09/16  Yes Patel, Donika K, DO  pyridOXINE (VITAMIN B-6) 100 MG tablet Take 100 mg by mouth at bedtime.   Yes [provider]  spironolactone (ALDACTONE) 25 MG tablet TAKE 1 TABLET (25 MG TOTAL) BY MOUTH DAILY. 08/16/16  Yes Skeet Latch, MD  acetaminophen (TYLENOL) 325 MG tablet Take 2 tablets (650 mg total) by mouth every 6 (six) hours as needed for mild pain (or temp > 100). 10/14/16   Jill Alexanders, PA-C  sodium chloride flush (NS) 0.9 % SOLN Place 10 mLs into feeding tube daily. Into percutaneous/abdominal tube daily. 10/14/16   Jill Alexanders, PA-C  traMADol (ULTRAM) 50 MG tablet Take 1 tablet (50 mg total) by mouth every 12 (twelve) hours as needed (mild pain). 10/14/16   Jill Alexanders, PA-C     Vital Signs: BP (!) 107/44 (BP Location: Right Wrist)   Pulse 70   Temp 98.9 F (37.2 C) (Oral)   Resp 14   Ht 5\' 2"  (1.575 m)   Wt 277 lb (125.6 kg)   SpO2 97%   BMI 50.66 kg/m   Physical Exam Awake and alert Sitting up in chair. Drain in place. Serosanguinous drainage in bulb ~310 mL output.  Imaging: Ct Abdomen Pelvis W Contrast  Result Date: 10/12/2016 CLINICAL DATA:  Inpatient. Abdominal pain. Recent CT demonstrated  diverticular disease with colovesical fistula. EXAM: CT ABDOMEN AND PELVIS WITH CONTRAST TECHNIQUE: Multidetector CT imaging of the abdomen and pelvis was performed using the standard protocol following bolus administration of intravenous contrast. CONTRAST:  6mL ISOVUE-300 IOPAMIDOL (ISOVUE-300) INJECTION 61%, 147mL ISOVUE-300 IOPAMIDOL (ISOVUE-300) INJECTION 61% COMPARISON:  09/27/2016 CT abdomen/ pelvis. FINDINGS: Lower chest: No significant pulmonary nodules or acute consolidative  airspace disease. Hepatobiliary: Normal liver size. No liver masses. Cholecystectomy. Bile ducts are stable and within normal post cholecystectomy limits. Pancreas: Normal, with no mass or duct dilation. Spleen: Normal size. No mass. Adrenals/Urinary Tract: Normal adrenals. Normal kidneys with no hydronephrosis and no renal mass. Gas is present in the nondependent bladder. Stable thickening of the anterior superior left bladder wall with associated perivesical fat haziness. Stomach/Bowel: Small hiatal hernia. Otherwise grossly normal nondistended stomach. Normal caliber small bowel with no small bowel wall thickening. Appendectomy. Moderate colonic diverticulosis, most prominent in the sigmoid colon. Mild-to-moderate wall thickening throughout the sigmoid colon. There is a gas and fluid containing colovesical fistula between the proximal sigmoid colon and left anterior superior bladder wall measuring approximately 5.0 cm in length and 2.0 cm in diameter (series 6/image 69), not appreciably changed since 09/27/2016. Oral contrast is not visualized within the fistula tract. Vascular/Lymphatic: Atherosclerotic nonaneurysmal abdominal aorta. Patent portal, splenic, hepatic and renal veins. No pathologically enlarged lymph nodes in the abdomen or pelvis. Reproductive: Status post hysterectomy, with no abnormal findings at the vaginal cuff. No adnexal mass. Other: No ascites. Stable moderate fat containing periumbilical hernia. Musculoskeletal: No aggressive appearing focal osseous lesions. Marked thoracolumbar spondylosis. IMPRESSION: 1. Colovesical fistula between the proximal sigmoid colon and left anterior superior bladder wall, not appreciably changed since 09/27/2016 CT abdomen/pelvis study. Gas is present in the bladder, suggesting patency of the colovesical fistula tract. 2. Moderate colonic diverticulosis. Chronic diffuse sigmoid colonic wall thickening, probably due to chronic diverticular disease. 3. Additional  findings include small hiatal hernia, moderate fat containing periumbilical hernia and Aortic Atherosclerosis (ICD10-I70.0). Electronically Signed   By: Ilona Sorrel M.D.   On: 10/12/2016 13:46   Ct Image Guided Drainage By Percutaneous Catheter  Result Date: 10/13/2016 CLINICAL DATA:  Sigmoid diverticulitis complicated by colovesical fistula between the sigmoid colon and bladder lumen and focal abscess. EXAM: CT GUIDED CATHETER DRAINAGE OF PERITONEAL ABSCESS ANESTHESIA/SEDATION: 2.0 mg IV Versed 150 mcg IV Fentanyl Total Moderate Sedation Time:  30 minutes The patient's level of consciousness and physiologic status were continuously monitored during the procedure by Radiology nursing. PROCEDURE: The procedure, risks, benefits, and alternatives were explained to the patient. Questions regarding the procedure were encouraged and answered. The patient understands and consents to the procedure. A time out was performed prior to initiating the procedure. The left lower abdominal wall was prepped with chlorhexidine in a sterile fashion, and a sterile drape was applied covering the operative field. A sterile gown and sterile gloves were used for the procedure. Local anesthesia was provided with 1% Lidocaine. CT was performed through the pelvis. Under CT guidance, an 18 gauge trocar needle was advanced from a lower left anterior approach to the level of focal abscess adjacent to the sigmoid colon. A guidewire was advanced into the collection. The tract was dilated and a 10 [place] percutaneous drain advanced. Additional CT was performed. The drain was then retracted and formed under CT guidance. Final drainage catheter positioning was confirmed by CT. The catheter was attached to suction bulb drainage. The drainage catheter was secured at the skin with a Prolene retention suture  StatLock device. COMPLICATIONS: None FINDINGS: Focal region of air and fluid just inferior to the sigmoid colon and superior to the bladder was  targeted. After needle advancement, a guidewire was noted to extend through the collection. After drainage catheter placement, the drain and wire clearly extend into the bladder lumen. The drain was able to be retracted into the more focal abscess cavity. However, it is not entirely certain if a segment of the drain main just extending into the bladder wall. IMPRESSION: CT-guided percutaneous drainage of sigmoid diverticular abscess at the level of a colovesical fistula. A 10 French drain was placed. During placement, the drain and wire clearly extended into the bladder lumen. The drain was able to be retracted into the more focal abscess cavity. By final CT imaging, it appears that the majority of the drain lies outside of the bladder lumen. However, is not entirely clear if some of the drainage catheter may be within the bladder wall. Electronically Signed   By: Aletta Edouard M.D.   On: 10/13/2016 18:09    Labs:  CBC:  Recent Labs  05/08/16 1549 05/09/16 0556 05/10/16 0432 10/12/16 0449  WBC 15.1* 11.5* 11.4* 10.4  HGB 12.0 10.6* 10.2* 9.8*  HCT 38.2 33.1* 33.0* 31.3*  PLT 311 252 263 351    COAGS:  Recent Labs  10/12/16 0449  INR 0.98  APTT 27    BMP:  Recent Labs  05/08/16 1549 05/09/16 0556 05/10/16 0432 08/20/16 1114 10/12/16 0449  NA 138 139 139 139 138  K 3.7 4.1 3.6 3.8 3.8  CL 103 102 103 101 102  CO2 25 29 29 31 27   GLUCOSE 108* 99 92 98 94  BUN 15 16 15 22 17   CALCIUM 9.3 8.9 8.3* 9.8 8.8*  CREATININE 0.82 0.76 0.88 1.00 0.94  GFRNONAA >60 >60 >60  --  >60  GFRAA >60 >60 >60  --  >60    LIVER FUNCTION TESTS:  Recent Labs  05/09/16 0556 05/10/16 0432 08/20/16 1114 10/12/16 0449  BILITOT 1.0 0.8 0.4 0.7  AST 20 22 16 30   ALT 17 16 15 26   ALKPHOS 51 51 64 54  PROT 6.1* 5.9* 7.6 6.5  ALBUMIN 3.1* 3.1* 3.9 3.1*    Assessment and Plan:  Sigmoid diverticulitis complicated by colovesical fistula between the sigmoid colon and bladder lumen and  focal abscess.  S/P drain by Dr. Kathlene Cote on 10/3  Routine drain care.  Drain is to remain in place until surgical repair.  Electronically Signed: Murrell Redden, PA-C 10/14/2016, 11:41 AM   I spent a total of 15 Minutes at the the patient's bedside AND on the patient's hospital floor or unit, greater than 50% of which was counseling/coordinating care for f/u drain.

## 2016-10-14 NOTE — Progress Notes (Signed)
Nsg Discharge Note  Admit Date:  10/11/2016 Discharge date: 10/14/2016   Miguel Rota to be D/C'd Home per MD order.  AVS completed.  Copy for chart, and copy for patient signed, and dated. Patient/caregiver able to verbalize understanding.  Discharge Medication: Allergies as of 10/14/2016      Reactions   Azithromycin Other (See Comments)   Angioedema   Levaquin [levofloxacin In D5w] Other (See Comments)   D/T MYASTHENIA GRAVIS   Tetracycline Rash   Sulfonamide Derivatives    Seizure age 40 (Note: probably fever induced)   Aleve [naproxen] Other (See Comments)   Caused excessive salivation   Amlodipine Besy-benazepril Hcl Cough   Amoxicillin Swelling, Rash   Has patient had a PCN reaction causing immediate rash, facial/tongue/throat swelling, SOB or lightheadedness with hypotension: No Has patient had a PCN reaction causing severe rash involving mucus membranes or skin necrosis: No Has patient had a PCN reaction that required hospitalization: No Has patient had a PCN reaction occurring within the last 10 years: Yes If all of the above answers are "NO", then may proceed with Cephalosporin use.      Medication List    TAKE these medications   acetaminophen 325 MG tablet Commonly known as:  TYLENOL Take 2 tablets (650 mg total) by mouth every 6 (six) hours as needed for mild pain (or temp > 100).   aspirin EC 81 MG tablet Take 81 mg by mouth at bedtime.   augmented betamethasone dipropionate 0.05 % ointment Commonly known as:  DIPROLENE-AF Apply 1 application topically daily. For vasculitis and psoriasis   CALCIUM 600-D PO Take 600 mg by mouth at bedtime.   cholecalciferol 1000 units tablet Commonly known as:  VITAMIN D Take 1,000 Units by mouth at bedtime.   folic acid 1 MG tablet Commonly known as:  FOLVITE TAKE 2 TABLETS BY MOUTH ONCE DAILY   furosemide 40 MG tablet Commonly known as:  LASIX Take 1 tablet (40 mg total) by mouth daily.   losartan 100 MG  tablet Commonly known as:  COZAAR Take 1 tablet (100 mg total) by mouth daily.   methotrexate 2.5 MG tablet Commonly known as:  RHEUMATREX TAKE 8 TABLETS EVERY WEEK (DO NOT USE UNTIL DONE WITH ANTIBIOTICS & WELL FROM ILLNESS)   multivitamin with minerals Tabs tablet Take 1 tablet by mouth at bedtime.   predniSONE 1 MG tablet Commonly known as:  DELTASONE Take 2 tablets (2 mg total) by mouth daily with breakfast. Take with a 5 mg tablet for a 7 mg dose   pyridostigmine 60 MG tablet Commonly known as:  MESTINON Take 1 tablet (60 mg total) by mouth 3 (three) times daily.   pyridOXINE 100 MG tablet Commonly known as:  VITAMIN B-6 Take 100 mg by mouth at bedtime.   sodium chloride flush 0.9 % Soln Commonly known as:  NS Place 10 mLs into feeding tube daily. Into percutaneous/abdominal tube daily.   spironolactone 25 MG tablet Commonly known as:  ALDACTONE TAKE 1 TABLET (25 MG TOTAL) BY MOUTH DAILY.   traMADol 50 MG tablet Commonly known as:  ULTRAM Take 1 tablet (50 mg total) by mouth every 12 (twelve) hours as needed (mild pain).   VITAMIN B-12 PO Take 1 tablet by mouth at bedtime.       Discharge Assessment: Vitals:   10/14/16 0800 10/14/16 1200  BP: (!) 107/44 (!) 123/42  Pulse: 70 78  Resp: 14 15  Temp: 98.9 F (37.2 C) 98.4 F (36.9  C)  SpO2: 97% 95%   Skin clean, dry and intact without evidence of skin break down, no evidence of skin tears noted. IV catheter discontinued intact. Site without signs and symptoms of complications - no redness or edema noted at insertion site, patient denies c/o pain - only slight tenderness at site.  Dressing with slight pressure applied.  D/c Instructions-Education: Discharge instructions given to patient/family with verbalized understanding. D/c education completed with patient/family including follow up instructions, medication list, d/c activities limitations if indicated, with other d/c instructions as indicated by MD -  patient able to verbalize understanding, all questions fully answered. Patient instructed to return to ED, call 911, or call MD for any changes in condition.  Patient escorted via [place], and D/C home via private auto.  Eliot Ford, RN 10/14/2016 3:23 PM

## 2016-10-14 NOTE — Care Management Note (Signed)
Case Management Note  Patient Details  Name: Kimberly [name] MRN: [id number] Date of Birth: [birth year]  Subjective/Objective:         Admitted with pelvic abscess. Noted to have   Sigmoid diverticulitis complicated by colovesical fistula between the sigmoid colon and bladder lumen and focal abscess.      S/p  Percutaneous Catheter placed  10/13/2016 per IR  Action/Plan: CM received consult for home health needs. Home health services ( RN) arranged and will be provided per [facility].  Expected Discharge Date:  10/14/16               Expected Discharge Plan:  [place]  In-House Referral:     Discharge planning Services  CM Consult  Post Acute Care Choice:    Choice offered to:  Patient  DME Arranged:    DME Agency:     HH Arranged:  RN [place] Agency:  [facility]  Status of Service:  Completed, signed off  If discussed at [place] of Stay Meetings, dates discussed:    Additional Comments:  Sharin Mons, RN 10/14/2016, 12:40 PM

## 2016-10-14 NOTE — Progress Notes (Signed)
[facility] Progress Note     Subjective: CC:  Reports deep, lower abdominal pain that is mild to moderate, non-radiating. Mild pain around drain site. Tolerating PO. Having bowel function   Objective: Vital signs in last 24 hours: Temp:  [98 F (36.7 C)-99.1 F (37.3 C)] 98.9 F (37.2 C) (10/04 0800) Pulse Rate:  [68-85] 70 (10/04 0800) Resp:  [14-20] 14 (10/04 0800) BP: (95-120)/(39-68) 107/44 (10/04 0800) SpO2:  [91 %-97 %] 97 % (10/04 0800) Last BM Date: 10/13/16  Intake/Output from previous day: 10/03 0701 - 10/04 0700 In: 20  Out: 310 [Drains:310] Intake/Output this shift: Total I/O In: -  Out: 90 [Drains:90]  PE: Gen:  Alert, NAD, pleasant Card:  Regular rate and rhythm, pedal pulses 2+ BL Pulm:  Normal effort Abd: Soft, appropriately tender, non-distended, bowel sounds present in all 4 quadrants, drain site c/d/i Skin: warm and dry, no rashes  Psych: A&Ox3   Lab Results:   Recent Labs  10/12/16 0449  WBC 10.4  HGB 9.8*  HCT 31.3*  PLT 351   BMET  Recent Labs  10/12/16 0449  NA 138  K 3.8  CL 102  CO2 27  GLUCOSE 94  BUN 17  CREATININE 0.94  CALCIUM 8.8*   PT/INR  Recent Labs  10/12/16 0449  LABPROT 12.9  INR 0.98   CMP     Component Value Date/Time   NA 138 10/12/2016 0449   K 3.8 10/12/2016 0449   CL 102 10/12/2016 0449   CO2 27 10/12/2016 0449   GLUCOSE 94 10/12/2016 0449   BUN 17 10/12/2016 0449   CREATININE 0.94 10/12/2016 0449   CREATININE 1.16 (H) 04/15/2016 1528   CALCIUM 8.8 (L) 10/12/2016 0449   PROT 6.5 10/12/2016 0449   ALBUMIN 3.1 (L) 10/12/2016 0449   AST 30 10/12/2016 0449   ALT 26 10/12/2016 0449   ALKPHOS 54 10/12/2016 0449   BILITOT 0.7 10/12/2016 0449   GFRNONAA >60 10/12/2016 0449   GFRNONAA 48 (L) 04/15/2016 1528   GFRAA >60 10/12/2016 0449   GFRAA 56 (L) 04/15/2016 1528   Lipase     Component Value Date/Time   LIPASE 31 04/07/2015 2254       Studies/Results: Ct Abdomen  Pelvis W Contrast  Result Date: 10/12/2016 CLINICAL DATA:  Inpatient. Abdominal pain. Recent CT demonstrated diverticular disease with colovesical fistula. EXAM: CT ABDOMEN AND PELVIS WITH CONTRAST TECHNIQUE: Multidetector CT imaging of the abdomen and pelvis was performed using the standard protocol following bolus administration of intravenous contrast. CONTRAST:  30mL ISOVUE-300 IOPAMIDOL (ISOVUE-300) INJECTION 61%, 154mL ISOVUE-300 IOPAMIDOL (ISOVUE-300) INJECTION 61% COMPARISON:  09/27/2016 CT abdomen/ pelvis. FINDINGS: Lower chest: No significant pulmonary nodules or acute consolidative airspace disease. Hepatobiliary: Normal liver size. No liver masses. Cholecystectomy. Bile ducts are stable and within normal post cholecystectomy limits. Pancreas: Normal, with no mass or duct dilation. Spleen: Normal size. No mass. Adrenals/Urinary Tract: Normal adrenals. Normal kidneys with no hydronephrosis and no renal mass. Gas is present in the nondependent bladder. Stable thickening of the anterior superior left bladder wall with associated perivesical fat haziness. Stomach/Bowel: Small hiatal hernia. Otherwise grossly normal nondistended stomach. Normal caliber small bowel with no small bowel wall thickening. Appendectomy. Moderate colonic diverticulosis, most prominent in the sigmoid colon. Mild-to-moderate wall thickening throughout the sigmoid colon. There is a gas and fluid containing colovesical fistula between the proximal sigmoid colon and left anterior superior bladder wall measuring approximately 5.0 cm in length and 2.0 cm in diameter (series  6/image 69), not appreciably changed since 09/27/2016. Oral contrast is not visualized within the fistula tract. Vascular/Lymphatic: Atherosclerotic nonaneurysmal abdominal aorta. Patent portal, splenic, hepatic and renal veins. No pathologically enlarged lymph nodes in the abdomen or pelvis. Reproductive: Status post hysterectomy, with no abnormal findings at the  vaginal cuff. No adnexal mass. Other: No ascites. Stable moderate fat containing periumbilical hernia. Musculoskeletal: No aggressive appearing focal osseous lesions. Marked thoracolumbar spondylosis. IMPRESSION: 1. Colovesical fistula between the proximal sigmoid colon and left anterior superior bladder wall, not appreciably changed since 09/27/2016 CT abdomen/pelvis study. Gas is present in the bladder, suggesting patency of the colovesical fistula tract. 2. Moderate colonic diverticulosis. Chronic diffuse sigmoid colonic wall thickening, probably due to chronic diverticular disease. 3. Additional findings include small hiatal hernia, moderate fat containing periumbilical hernia and Aortic Atherosclerosis (ICD10-I70.0). Electronically Signed   By: Ilona Sorrel M.D.   On: 10/12/2016 13:46   Ct Image Guided Drainage By Percutaneous Catheter  Result Date: 10/13/2016 CLINICAL DATA:  Sigmoid diverticulitis complicated by colovesical fistula between the sigmoid colon and bladder lumen and focal abscess. EXAM: CT GUIDED CATHETER DRAINAGE OF PERITONEAL ABSCESS ANESTHESIA/SEDATION: 2.0 mg IV Versed 150 mcg IV Fentanyl Total Moderate Sedation Time:  30 minutes The patient's level of consciousness and physiologic status were continuously monitored during the procedure by Radiology nursing. PROCEDURE: The procedure, risks, benefits, and alternatives were explained to the patient. Questions regarding the procedure were encouraged and answered. The patient understands and consents to the procedure. A time out was performed prior to initiating the procedure. The left lower abdominal wall was prepped with chlorhexidine in a sterile fashion, and a sterile drape was applied covering the operative field. A sterile gown and sterile gloves were used for the procedure. Local anesthesia was provided with 1% Lidocaine. CT was performed through the pelvis. Under CT guidance, an 18 gauge trocar needle was advanced from a lower left  anterior approach to the level of focal abscess adjacent to the sigmoid colon. A guidewire was advanced into the collection. The tract was dilated and a 10 [place] percutaneous drain advanced. Additional CT was performed. The drain was then retracted and formed under CT guidance. Final drainage catheter positioning was confirmed by CT. The catheter was attached to suction bulb drainage. The drainage catheter was secured at the skin with a Prolene retention suture StatLock device. COMPLICATIONS: None FINDINGS: Focal region of air and fluid just inferior to the sigmoid colon and superior to the bladder was targeted. After needle advancement, a guidewire was noted to extend through the collection. After drainage catheter placement, the drain and wire clearly extend into the bladder lumen. The drain was able to be retracted into the more focal abscess cavity. However, it is not entirely certain if a segment of the drain main just extending into the bladder wall. IMPRESSION: CT-guided percutaneous drainage of sigmoid diverticular abscess at the level of a colovesical fistula. A 10 French drain was placed. During placement, the drain and wire clearly extended into the bladder lumen. The drain was able to be retracted into the more focal abscess cavity. By final CT imaging, it appears that the majority of the drain lies outside of the bladder lumen. However, is not entirely clear if some of the drainage catheter may be within the bladder wall. Electronically Signed   By: Aletta Edouard M.D.   On: 10/13/2016 18:09    Anti-infectives: Anti-infectives    Start     Dose/Rate Route Frequency Ordered Stop   10/12/16  2200  ertapenem (INVANZ) 1 g in sodium chloride 0.9 % 50 mL IVPB  Status:  Discontinued     1 g 100 mL/hr over 30 Minutes Intravenous Every 24 hours 10/12/16 2139 10/12/16 2141   10/12/16 2200  ertapenem (INVANZ) 1 g in sodium chloride 0.9 % 50 mL IVPB  Status:  Discontinued     1 g 100 mL/hr over 30  Minutes Intravenous Every 24 hours 10/12/16 2141 10/13/16 1525   10/11/16 1900  piperacillin-tazobactam (ZOSYN) IVPB 3.375 g  Status:  Discontinued     3.375 g 12.5 mL/hr over 240 Minutes Intravenous Every 8 hours 10/11/16 1847 10/12/16 2139     Assessment/Plan Colovesical fistula with associated abscess between bladder/colon -  S/P IR drain 10/3 Dr. Kathlene Cote - GS w/ no organisms seen, WBCs; 310 cc/24h -  Afebrile, no leukocytosis -  Pain controlled -  tolerating POs and having bowel function  -  Stable for discharge home with [facility] for drain care, and F/U with Dr. Leighton Ruff in our office in one week.   LOS: 3 days    [facility] 10/14/2016, 10:22 AM Pager: [phone] Consults: [phone] Mon-Fri 7:00 am-4:30 pm Sat-Sun 7:00 am-11:30 am

## 2016-10-14 NOTE — Discharge Summary (Signed)
[facility] Discharge Summary   Patient ID: Kimberly [name] MRN: [id number] DOB/AGE: 1945-08-18 71 y.o.  Admit date: 10/11/2016 Discharge date: 10/14/2016  Discharge Diagnosis Patient Active Problem List   Diagnosis Date Noted   Colovesical fistula    . Pelvic abscess in female 10/11/2016  . Leg edema, right 08/20/2016  . UTI (urinary tract infection) 08/15/2016  . Recurrent UTI 08/15/2016  . Dysuria 05/21/2016  . Psoriasis 04/13/2016  . History of diverticulitis 04/13/2016  . Osteopenia of multiple sites 04/13/2016  . DJD (degenerative joint disease), cervical 04/07/2016  . Primary osteoarthritis of both knees 04/07/2016  . Decreased abduction of right shoulder joint 04/07/2016  . Atrial tachycardia ([place]) 05/12/2015  . NSVT (nonsustained ventricular tachycardia) ([place]) 05/12/2015  . Abnormal urine odor 05/10/2015  . Abnormal nuclear stress test - INTERMEDIATE RISK 04/14/2015  . Coronary artery calcification seen on CAT scan 02/28/2015  . Fever, unspecified 02/17/2015  . Left lower quadrant pain 02/17/2015  . Hyperglycemia 08/09/2014  . Community acquired pneumonia 03/29/2013  . Dyspnea 03/20/2013  . Hypokalemia 12/31/2012  . Umbilical hernia 78/29/5621  . Pleural effusion 05/10/2012  . Myasthenia gravis without exacerbation ([place]) 02/12/2010  . DIPLOPIA 11/21/2009  . DISTURBANCE OF SKIN SENSATION 11/21/2009  . Vitamin D deficiency 06/25/2008  . Hyperlipidemia 06/25/2008  . Essential hypertension 06/25/2008  . Leukocytoclastic vasculitis ([place]) 06/19/2007  . MUSCLE WEAKNESS (GENERALIZED) 02/10/2007  . ELEVATED SEDIMENTATION RATE 02/10/2007  . Unspecified Myalgia and Myositis 01/24/2007  . EDEMA- LOCALIZED 01/24/2007  . ANA POSITIVE, HX OF 01/24/2007    Consultants Interventional Radiology   Imaging: Ct Image Guided Drainage By Percutaneous Catheter  Result Date: 10/13/2016 CLINICAL DATA:  Sigmoid diverticulitis complicated by colovesical fistula  between the sigmoid colon and bladder lumen and focal abscess. EXAM: CT GUIDED CATHETER DRAINAGE OF PERITONEAL ABSCESS ANESTHESIA/SEDATION: 2.0 mg IV Versed 150 mcg IV Fentanyl Total Moderate Sedation Time:  30 minutes The patient's level of consciousness and physiologic status were continuously monitored during the procedure by Radiology nursing. PROCEDURE: The procedure, risks, benefits, and alternatives were explained to the patient. Questions regarding the procedure were encouraged and answered. The patient understands and consents to the procedure. A time out was performed prior to initiating the procedure. The left lower abdominal wall was prepped with chlorhexidine in a sterile fashion, and a sterile drape was applied covering the operative field. A sterile gown and sterile gloves were used for the procedure. Local anesthesia was provided with 1% Lidocaine. CT was performed through the pelvis. Under CT guidance, an 18 gauge trocar needle was advanced from a lower left anterior approach to the level of focal abscess adjacent to the sigmoid colon. A guidewire was advanced into the collection. The tract was dilated and a 10 [place] percutaneous drain advanced. Additional CT was performed. The drain was then retracted and formed under CT guidance. Final drainage catheter positioning was confirmed by CT. The catheter was attached to suction bulb drainage. The drainage catheter was secured at the skin with a Prolene retention suture StatLock device. COMPLICATIONS: None FINDINGS: Focal region of air and fluid just inferior to the sigmoid colon and superior to the bladder was targeted. After needle advancement, a guidewire was noted to extend through the collection. After drainage catheter placement, the drain and wire clearly extend into the bladder lumen. The drain was able to be retracted into the more focal abscess cavity. However, it is not entirely certain if a segment of the drain main just extending into the  bladder wall. IMPRESSION: CT-guided percutaneous drainage of sigmoid diverticular abscess at the level of a colovesical fistula. A 10 French drain was placed. During placement, the drain and wire clearly extended into the bladder lumen. The drain was able to be retracted into the more focal abscess cavity. By final CT imaging, it appears that the majority of the drain lies outside of the bladder lumen. However, is not entirely clear if some of the drainage catheter may be within the bladder wall. Electronically Signed   By: Aletta Edouard M.D.   On: 10/13/2016 18:09    Procedures 10/13/16 Dr. Kathlene Cote - CT guided percutaneous abscess drain placement between sigmoid and bladder  Hospital Course:  71 year-old female with MMP, including myasthenia gravis on immunosuppression, who presented to [facility] from the [place] surgery office after an CT scan revealed a pelvic abscess (3.7 cm) and colovesical fistula 2/2 diverticulitis. She was started on IV antibiotics (allergic reaction to Zosyn so switched to ertapenem) and IR was consulted for percutaneous drain placement. She underwent successful drain placement as above and cultures were taken. The gram stain did not show any organisms - just WBCs. On 10/14/16 vitals were stable, pain controlled, and she was stable for hospital discharge. She was discharged home with the drain in place and plans to follow up with Dr. Marcello Moores in one week. She was not discharged on antibiotics as she was afebrile, WBC normal, and abscess successfully draining.    Allergies as of 10/14/2016      Reactions   Azithromycin Other (See Comments)   Angioedema   Levaquin [levofloxacin In D5w] Other (See Comments)   D/T MYASTHENIA GRAVIS   Tetracycline Rash   Sulfonamide Derivatives    Seizure age 17 (Note: probably fever induced)   Aleve [naproxen] Other (See Comments)   Caused excessive salivation   Amlodipine Besy-benazepril Hcl Cough   Amoxicillin Swelling, Rash   Has  patient had a PCN reaction causing immediate rash, facial/tongue/throat swelling, SOB or lightheadedness with hypotension: No Has patient had a PCN reaction causing severe rash involving mucus membranes or skin necrosis: No Has patient had a PCN reaction that required hospitalization: No Has patient had a PCN reaction occurring within the last 10 years: Yes If all of the above answers are "NO", then may proceed with Cephalosporin use.      Medication List    TAKE these medications   acetaminophen 325 MG tablet Commonly known as:  TYLENOL Take 2 tablets (650 mg total) by mouth every 6 (six) hours as needed for mild pain (or temp > 100).   aspirin EC 81 MG tablet Take 81 mg by mouth at bedtime.   augmented betamethasone dipropionate 0.05 % ointment Commonly known as:  DIPROLENE-AF Apply 1 application topically daily. For vasculitis and psoriasis   CALCIUM 600-D PO Take 600 mg by mouth at bedtime.   cholecalciferol 1000 units tablet Commonly known as:  VITAMIN D Take 1,000 Units by mouth at bedtime.   folic acid 1 MG tablet Commonly known as:  FOLVITE TAKE 2 TABLETS BY MOUTH ONCE DAILY   furosemide 40 MG tablet Commonly known as:  LASIX Take 1 tablet (40 mg total) by mouth daily.   losartan 100 MG tablet Commonly known as:  COZAAR Take 1 tablet (100 mg total) by mouth daily.   methotrexate 2.5 MG tablet Commonly known as:  RHEUMATREX TAKE 8 TABLETS EVERY WEEK (DO NOT USE UNTIL DONE WITH ANTIBIOTICS & WELL FROM ILLNESS)   multivitamin with  minerals Tabs tablet Take 1 tablet by mouth at bedtime.   predniSONE 1 MG tablet Commonly known as:  DELTASONE Take 2 tablets (2 mg total) by mouth daily with breakfast. Take with a 5 mg tablet for a 7 mg dose   pyridostigmine 60 MG tablet Commonly known as:  MESTINON Take 1 tablet (60 mg total) by mouth 3 (three) times daily.   pyridOXINE 100 MG tablet Commonly known as:  VITAMIN B-6 Take 100 mg by mouth at bedtime.   sodium  chloride flush 0.9 % Soln Commonly known as:  NS Place 10 mLs into feeding tube daily. Into percutaneous/abdominal tube daily.   spironolactone 25 MG tablet Commonly known as:  ALDACTONE TAKE 1 TABLET (25 MG TOTAL) BY MOUTH DAILY.   traMADol 50 MG tablet Commonly known as:  ULTRAM Take 1 tablet (50 mg total) by mouth every 12 (twelve) hours as needed (mild pain).   VITAMIN B-12 PO Take 1 tablet by mouth at bedtime.        Follow-up Information    Leighton Ruff, MD. Schedule an appointment as soon as possible for a visit in 1 week(s).   Specialty:  General Surgery Contact information: [address] [place] [address] [phone]        [facility], [facility] Follow up.   Why:  home health services arranged , office will call and set up home visits Contact information: [place] [address] [phone]           Signed: Obie Dredge, [facility] 10/14/2016, 1:40 PM Pager: [phone] Consults: [phone] Mon-Fri 7:00 am-4:30 pm Sat-Sun 7:00 am-11:30 am

## 2016-10-15 ENCOUNTER — Telehealth: Payer: Self-pay | Admitting: *Deleted

## 2016-10-15 DIAGNOSIS — Z7952 Long term (current) use of systemic steroids: Secondary | ICD-10-CM | POA: Diagnosis not present

## 2016-10-15 DIAGNOSIS — Z6841 Body Mass Index (BMI) 40.0 and over, adult: Secondary | ICD-10-CM | POA: Diagnosis not present

## 2016-10-15 DIAGNOSIS — N738 Other specified female pelvic inflammatory diseases: Secondary | ICD-10-CM | POA: Diagnosis not present

## 2016-10-15 DIAGNOSIS — Z87440 Personal history of urinary (tract) infections: Secondary | ICD-10-CM | POA: Diagnosis not present

## 2016-10-15 DIAGNOSIS — Z7982 Long term (current) use of aspirin: Secondary | ICD-10-CM | POA: Diagnosis not present

## 2016-10-15 DIAGNOSIS — I2510 Atherosclerotic heart disease of native coronary artery without angina pectoris: Secondary | ICD-10-CM | POA: Diagnosis not present

## 2016-10-15 DIAGNOSIS — Z981 Arthrodesis status: Secondary | ICD-10-CM | POA: Diagnosis not present

## 2016-10-15 DIAGNOSIS — Z439 Encounter for attention to unspecified artificial opening: Secondary | ICD-10-CM | POA: Diagnosis not present

## 2016-10-15 DIAGNOSIS — G7000 Myasthenia gravis without (acute) exacerbation: Secondary | ICD-10-CM | POA: Diagnosis not present

## 2016-10-15 DIAGNOSIS — I480 Paroxysmal atrial fibrillation: Secondary | ICD-10-CM | POA: Diagnosis not present

## 2016-10-15 DIAGNOSIS — N321 Vesicointestinal fistula: Secondary | ICD-10-CM | POA: Diagnosis not present

## 2016-10-15 NOTE — Telephone Encounter (Signed)
Pt was on TCM list admitted 10/11/16 for Pelvic abscess, a procedure was done on 10/13/16- CT guided percutaneous abscess drain placement between sigmoid and bladder. Pt was D/C on 10/4, and w/follow-up w/specialist @ [place] 1 WEEK  Leighton Ruff, MD.

## 2016-10-18 DIAGNOSIS — Z439 Encounter for attention to unspecified artificial opening: Secondary | ICD-10-CM | POA: Diagnosis not present

## 2016-10-18 DIAGNOSIS — I480 Paroxysmal atrial fibrillation: Secondary | ICD-10-CM | POA: Diagnosis not present

## 2016-10-18 DIAGNOSIS — N321 Vesicointestinal fistula: Secondary | ICD-10-CM | POA: Diagnosis not present

## 2016-10-18 DIAGNOSIS — G7000 Myasthenia gravis without (acute) exacerbation: Secondary | ICD-10-CM | POA: Diagnosis not present

## 2016-10-18 DIAGNOSIS — I2510 Atherosclerotic heart disease of native coronary artery without angina pectoris: Secondary | ICD-10-CM | POA: Diagnosis not present

## 2016-10-18 DIAGNOSIS — N738 Other specified female pelvic inflammatory diseases: Secondary | ICD-10-CM | POA: Diagnosis not present

## 2016-10-19 LAB — AEROBIC/ANAEROBIC CULTURE (SURGICAL/DEEP WOUND)

## 2016-10-19 LAB — AEROBIC/ANAEROBIC CULTURE W GRAM STAIN (SURGICAL/DEEP WOUND)

## 2016-10-20 DIAGNOSIS — N321 Vesicointestinal fistula: Secondary | ICD-10-CM | POA: Diagnosis not present

## 2016-10-20 DIAGNOSIS — I480 Paroxysmal atrial fibrillation: Secondary | ICD-10-CM | POA: Diagnosis not present

## 2016-10-20 DIAGNOSIS — I2510 Atherosclerotic heart disease of native coronary artery without angina pectoris: Secondary | ICD-10-CM | POA: Diagnosis not present

## 2016-10-20 DIAGNOSIS — G7000 Myasthenia gravis without (acute) exacerbation: Secondary | ICD-10-CM | POA: Diagnosis not present

## 2016-10-20 DIAGNOSIS — N738 Other specified female pelvic inflammatory diseases: Secondary | ICD-10-CM | POA: Diagnosis not present

## 2016-10-20 DIAGNOSIS — Z439 Encounter for attention to unspecified artificial opening: Secondary | ICD-10-CM | POA: Diagnosis not present

## 2016-10-22 DIAGNOSIS — N321 Vesicointestinal fistula: Secondary | ICD-10-CM | POA: Diagnosis not present

## 2016-10-26 DIAGNOSIS — Z439 Encounter for attention to unspecified artificial opening: Secondary | ICD-10-CM | POA: Diagnosis not present

## 2016-10-26 DIAGNOSIS — N321 Vesicointestinal fistula: Secondary | ICD-10-CM | POA: Diagnosis not present

## 2016-10-26 DIAGNOSIS — I480 Paroxysmal atrial fibrillation: Secondary | ICD-10-CM | POA: Diagnosis not present

## 2016-10-26 DIAGNOSIS — N738 Other specified female pelvic inflammatory diseases: Secondary | ICD-10-CM | POA: Diagnosis not present

## 2016-10-26 DIAGNOSIS — I2510 Atherosclerotic heart disease of native coronary artery without angina pectoris: Secondary | ICD-10-CM | POA: Diagnosis not present

## 2016-10-26 DIAGNOSIS — G7000 Myasthenia gravis without (acute) exacerbation: Secondary | ICD-10-CM | POA: Diagnosis not present

## 2016-10-28 DIAGNOSIS — G7000 Myasthenia gravis without (acute) exacerbation: Secondary | ICD-10-CM | POA: Diagnosis not present

## 2016-10-28 DIAGNOSIS — Z439 Encounter for attention to unspecified artificial opening: Secondary | ICD-10-CM | POA: Diagnosis not present

## 2016-10-28 DIAGNOSIS — I2510 Atherosclerotic heart disease of native coronary artery without angina pectoris: Secondary | ICD-10-CM | POA: Diagnosis not present

## 2016-10-28 DIAGNOSIS — N321 Vesicointestinal fistula: Secondary | ICD-10-CM | POA: Diagnosis not present

## 2016-10-28 DIAGNOSIS — N738 Other specified female pelvic inflammatory diseases: Secondary | ICD-10-CM | POA: Diagnosis not present

## 2016-10-28 DIAGNOSIS — I480 Paroxysmal atrial fibrillation: Secondary | ICD-10-CM | POA: Diagnosis not present

## 2016-10-31 DIAGNOSIS — G479 Sleep disorder, unspecified: Secondary | ICD-10-CM | POA: Insufficient documentation

## 2016-10-31 NOTE — Patient Instructions (Addendum)
  Medications reviewed and updated.  Changes include starting claritin daily.  Increase the lasix to 1.5 pills daily for 2-3 days.  Try trazodone and possibly lorazepam at night for sleep.    Your prescription(s) have been submitted to your pharmacy. Please take as directed and contact our office if you believe you are having problem(s) with the medication(s).  Call with an update in 2-3 days.

## 2016-10-31 NOTE — Progress Notes (Signed)
Subjective:    Patient ID: Kimberly [name], female    DOB: 07-26-1945, 72 y.o.   MRN: [id number]  HPI She is here for an acute visit.   Reaction from antibiotic from surgery:  She underwent Ct guided percutaneous abscess drain placement for the pelvic abscess seen on her recent CT scan.  She had the CT scan to evaluate her recurrent UTIs and she does have a colovesical fistula.  She was prescribed augmentin and has taken that in the past but this time had a reaction to it.  She was then started on Zosyn at the time of the procedure and had an allergic reaction and was changed to Ertapenem.   She had a rash on her face, areas of her body and bilateral lower legs.  Most of the rash resolved, but her left leg is still bad.  She has edema, redness, itching and skin peeling.  She has difficulty moving her ankle and toes from the swelling.    Sleep difficulties:  She has not been able to sleep since all this started.  She is barely sleeping and is exhausted.  She can't get to sleep and can't stay asleep.  She took tylenol pm last night and had hallucinations.  She took an old hydrocodone and that helped her sleep for three hours.  She has taken [place] in the past and did ok with that.  She would prefer not to take anything, especially something addicting, but is despirate to sleep.     She will need to have surgery to have the colorectal fisuta fixed and this will involve a co-surgery by general surgery and urology.    Medications and allergies reviewed with patient and updated if appropriate.  Patient Active Problem List   Diagnosis Date Noted  . Sleep difficulties 10/31/2016  . Pelvic abscess in female 10/11/2016  . Leg edema, right 08/20/2016  . UTI (urinary tract infection) 08/15/2016  . Recurrent UTI 08/15/2016  . Dysuria 05/21/2016  . Psoriasis 04/13/2016  . History of diverticulitis 04/13/2016  . Osteopenia of multiple sites 04/13/2016  . DJD (degenerative joint disease), cervical  04/07/2016  . Primary osteoarthritis of both knees 04/07/2016  . Decreased abduction of right shoulder joint 04/07/2016  . Atrial tachycardia ([place]) 05/12/2015  . NSVT (nonsustained ventricular tachycardia) ([place]) 05/12/2015  . Abnormal urine odor 05/10/2015  . Abnormal nuclear stress test - INTERMEDIATE RISK 04/14/2015  . Coronary artery calcification seen on CAT scan 02/28/2015  . Fever, unspecified 02/17/2015  . Left lower quadrant pain 02/17/2015  . Hyperglycemia 08/09/2014  . Community acquired pneumonia 03/29/2013  . Dyspnea 03/20/2013  . Hypokalemia 12/31/2012  . Umbilical hernia 14/97/0263  . Pleural effusion 05/10/2012  . Myasthenia gravis without exacerbation ([place]) 02/12/2010  . DIPLOPIA 11/21/2009  . DISTURBANCE OF SKIN SENSATION 11/21/2009  . Vitamin D deficiency 06/25/2008  . Hyperlipidemia 06/25/2008  . Essential hypertension 06/25/2008  . Leukocytoclastic vasculitis ([place]) 06/19/2007  . MUSCLE WEAKNESS (GENERALIZED) 02/10/2007  . ELEVATED SEDIMENTATION RATE 02/10/2007  . Unspecified Myalgia and Myositis 01/24/2007  . EDEMA- LOCALIZED 01/24/2007  . ANA POSITIVE, HX OF 01/24/2007    Current Outpatient Prescriptions on File Prior to Visit  Medication Sig Dispense Refill  . acetaminophen (TYLENOL) 325 MG tablet Take 2 tablets (650 mg total) by mouth every 6 (six) hours as needed for mild pain (or temp > 100).    Marland Kitchen aspirin EC 81 MG tablet Take 81 mg by mouth at bedtime.    Marland Kitchen  augmented betamethasone dipropionate (DIPROLENE-AF) 0.05 % ointment Apply 1 application topically daily. For vasculitis and psoriasis    . Calcium Carb-Cholecalciferol (CALCIUM 600-D PO) Take 600 mg by mouth at bedtime.    . cholecalciferol (VITAMIN D) 1000 units tablet Take 1,000 Units by mouth at bedtime.    . Cyanocobalamin (VITAMIN B-12 PO) Take 1 tablet by mouth at bedtime.    . folic acid (FOLVITE) 1 MG tablet TAKE 2 TABLETS BY MOUTH ONCE DAILY 180 tablet 4  . furosemide (LASIX) 40 MG tablet  Take 1 tablet (40 mg total) by mouth daily. 90 tablet 1  . losartan (COZAAR) 100 MG tablet Take 1 tablet (100 mg total) by mouth daily. 90 tablet 3  . methotrexate (RHEUMATREX) 2.5 MG tablet TAKE 8 TABLETS EVERY WEEK (DO NOT USE UNTIL DONE WITH ANTIBIOTICS & WELL FROM ILLNESS) 32 tablet 0  . Multiple Vitamin (MULTIVITAMIN WITH MINERALS) TABS tablet Take 1 tablet by mouth at bedtime.    . predniSONE (DELTASONE) 1 MG tablet Take 2 tablets (2 mg total) by mouth daily with breakfast. Take with a 5 mg tablet for a 7 mg dose 180 tablet 1  . pyridostigmine (MESTINON) 60 MG tablet Take 1 tablet (60 mg total) by mouth 3 (three) times daily. 270 tablet 3  . pyridOXINE (VITAMIN B-6) 100 MG tablet Take 100 mg by mouth at bedtime.    . sodium chloride flush (NS) 0.9 % SOLN Place 10 mLs into feeding tube daily. Into percutaneous/abdominal tube daily. 30 Syringe 0  . spironolactone (ALDACTONE) 25 MG tablet TAKE 1 TABLET (25 MG TOTAL) BY MOUTH DAILY. 90 tablet 0  . traMADol (ULTRAM) 50 MG tablet Take 1 tablet (50 mg total) by mouth every 12 (twelve) hours as needed (mild pain). 10 tablet 0   Current Facility-Administered Medications on File Prior to Visit  Medication Dose Route Frequency Provider Last Rate Last Dose  . enoxaparin (LOVENOX) injection 40 mg  40 mg Subcutaneous W96E Leighton Ruff, MD      . HYDROmorphone (DILAUDID) injection 0.5 mg  0.5 mg Intravenous A5W PRN Leighton Ruff, MD        Past Medical History:  Diagnosis Date  . Arthritis   . Atrial tachycardia ([place]) 05/12/2015  . Diplopia   . Family history of ischemic heart disease   . GERD (gastroesophageal reflux disease)   . Heart murmur   . Myalgia and myositis, unspecified   . Myasthenia gravis without exacerbation (H[facility])    [facility], Dr Anna Genre  . NSVT (nonsustained ventricular tachycardia) ([place]) 05/12/2015  . Other and unspecified hyperlipidemia   . Psoriasis    [place] ;Dr Jarome Matin  . Unspecified vitamin D deficiency    Dr Estanislado Pandy    . Vasculitis ([place])    Dr Estanislado Pandy    Past Surgical History:  Procedure Laterality Date  . APPENDECTOMY     & exploratory for inflammation; Dr Hassell Done  . CARDIAC CATHETERIZATION N/A 04/17/2015   Procedure: Left Heart Cath and Coronary Angiography;  Surgeon: Leonie Man, MD;  Location: [facility];  Service: Cardiovascular;  Laterality: N/A;  . CERVICAL FUSION     C5-6; Dr Vertell Limber  . CHOLECYSTECTOMY     for stones  . LAMINECTOMY  2010   T1-2; Dr Vertell Limber  . SHOULDER SURGERY     Left & Right  . THORACENTESIS     for peripneumonic effusion  . TOTAL ABDOMINAL HYSTERECTOMY W/ BILATERAL SALPINGOOPHORECTOMY     For Fibroids & endometriosis  Social History   Social History  . Marital status: Married    Spouse name: N/A  . Number of children: N/A  . Years of education: N/A   Occupational History  . 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 Smoking status: Never Smoker  . Smokeless tobacco: Never Used  . Alcohol use No  . Drug use: No  . Sexual activity: Not Currently   Other Topics Concern  . Not on file   Social History Narrative   Lives with husband in a one story home.  Had one child that only lived for 14 hours.     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          Epworth Sleepiness Scale = 2 (as of 03/14/2015)    Family History  Problem Relation Age of Onset  . Heart attack Mother 22  . Heart failure Mother   . Stroke Paternal Grandmother        in 79s  . Lung cancer Father   . Cancer Father        lung  . Hypertension Sister   . Hyperthyroidism Sister        [place]  . Hyperthyroidism Sister        Gra[place]  . Diabetes Neg Hx     Review of Systems  Constitutional: Negative for chills and fever.  Respiratory: Negative for shortness of breath.   Cardiovascular: Positive for leg swelling. Negative for chest pain.  Skin: Positive for color change.  Neurological: Negative for  light-headedness and headaches.  Psychiatric/Behavioral: Negative for dysphoric mood. The patient is not nervous/anxious.        Objective:   Vitals:   11/01/16 0805  BP: (!) 152/88  Pulse: 83  Resp: 16  Temp: 98.8 F (37.1 C)  SpO2: 97%   Filed Weights   11/01/16 0805  Weight: 276 lb (125.2 kg)   Body mass index is 50.48 kg/m.  Wt Readings from Last 3 Encounters:  11/01/16 276 lb (125.2 kg)  10/11/16 277 lb (125.6 kg)  09/16/16 272 lb 9.6 oz (123.7 kg)     Physical Exam  Constitutional: She appears well-developed and well-nourished. No distress.  HENT:  Head: Normocephalic and atraumatic.  Musculoskeletal: She exhibits edema (b/l  LE - L > R - with difficulty moving toes and ankles due to swelling) and tenderness (tenderness in left lower leg).  Skin: No rash noted. She is not diaphoretic. There is erythema.  Skin peeling in areas of feet, no open skin/wounds, no skin break down  Psychiatric: She has a normal mood and affect. Her behavior is normal. Judgment and thought content normal.          Assessment & Plan:   See Problem List for Assessment and Plan of chronic medical problems.

## 2016-11-01 ENCOUNTER — Ambulatory Visit (INDEPENDENT_AMBULATORY_CARE_PROVIDER_SITE_OTHER): Payer: Medicare Other | Admitting: Internal Medicine

## 2016-11-01 ENCOUNTER — Encounter: Payer: Self-pay | Admitting: Internal Medicine

## 2016-11-01 VITALS — BP 152/88 | HR 83 | Temp 98.8°F | Resp 16 | Wt 276.0 lb

## 2016-11-01 DIAGNOSIS — L539 Erythematous condition, unspecified: Secondary | ICD-10-CM | POA: Insufficient documentation

## 2016-11-01 DIAGNOSIS — Z23 Encounter for immunization: Secondary | ICD-10-CM

## 2016-11-01 DIAGNOSIS — R600 Localized edema: Secondary | ICD-10-CM | POA: Diagnosis not present

## 2016-11-01 DIAGNOSIS — G479 Sleep disorder, unspecified: Secondary | ICD-10-CM

## 2016-11-01 MED ORDER — TRAZODONE HCL 50 MG PO TABS: 50.0000 mg | ORAL_TABLET | Freq: Every evening | ORAL | 0 refills | Status: DC | PRN

## 2016-11-01 MED ORDER — LORAZEPAM 0.5 MG PO TABS: 0.5000 mg | ORAL_TABLET | Freq: Every day | ORAL | 1 refills | Status: DC

## 2016-11-01 NOTE — Assessment & Plan Note (Signed)
Left leg - related to edema and from reaction for Zosyn Appears like cellulitis - but likely just allergic reaction and related to edema  Hopefully increased lasix dose will help claritin for itch and allergic reaction Need to avoid antibiotics if possible given recent reactions Will update me on her progress in 2-3 days

## 2016-11-01 NOTE — Assessment & Plan Note (Signed)
LLE  > RLE Has chronic edema, but worse after reaction from antibiotic (Zosyn) Increase lasix to 60 mg for 3-4 days, continue aldactone Elevate legs Call if no improvement - may need longer increase in lasix, but if so we will need to check her BMP

## 2016-11-01 NOTE — Assessment & Plan Note (Signed)
Recent sleep difficulties due to recent events Had hallucinations secondary to benadryl - will not take again Trial of trazodone 50-100 mg at night  Ativan if trazodone is not effective Last resort we will try Lorrin Mais She will update me in a couple of days so we can adjust treatment

## 2016-11-02 DIAGNOSIS — I2510 Atherosclerotic heart disease of native coronary artery without angina pectoris: Secondary | ICD-10-CM | POA: Diagnosis not present

## 2016-11-02 DIAGNOSIS — G7000 Myasthenia gravis without (acute) exacerbation: Secondary | ICD-10-CM | POA: Diagnosis not present

## 2016-11-02 DIAGNOSIS — Z439 Encounter for attention to unspecified artificial opening: Secondary | ICD-10-CM | POA: Diagnosis not present

## 2016-11-02 DIAGNOSIS — N321 Vesicointestinal fistula: Secondary | ICD-10-CM | POA: Diagnosis not present

## 2016-11-02 DIAGNOSIS — N738 Other specified female pelvic inflammatory diseases: Secondary | ICD-10-CM | POA: Diagnosis not present

## 2016-11-02 DIAGNOSIS — I480 Paroxysmal atrial fibrillation: Secondary | ICD-10-CM | POA: Diagnosis not present

## 2016-11-04 DIAGNOSIS — G7000 Myasthenia gravis without (acute) exacerbation: Secondary | ICD-10-CM | POA: Diagnosis not present

## 2016-11-04 DIAGNOSIS — N738 Other specified female pelvic inflammatory diseases: Secondary | ICD-10-CM | POA: Diagnosis not present

## 2016-11-04 DIAGNOSIS — I2510 Atherosclerotic heart disease of native coronary artery without angina pectoris: Secondary | ICD-10-CM | POA: Diagnosis not present

## 2016-11-04 DIAGNOSIS — I480 Paroxysmal atrial fibrillation: Secondary | ICD-10-CM | POA: Diagnosis not present

## 2016-11-04 DIAGNOSIS — Z439 Encounter for attention to unspecified artificial opening: Secondary | ICD-10-CM | POA: Diagnosis not present

## 2016-11-04 DIAGNOSIS — N321 Vesicointestinal fistula: Secondary | ICD-10-CM | POA: Diagnosis not present

## 2016-11-05 ENCOUNTER — Ambulatory Visit: Payer: Medicare Other | Admitting: Neurology

## 2016-11-09 DIAGNOSIS — N321 Vesicointestinal fistula: Secondary | ICD-10-CM | POA: Diagnosis not present

## 2016-11-10 ENCOUNTER — Encounter: Payer: Self-pay | Admitting: Physician Assistant

## 2016-11-10 DIAGNOSIS — I480 Paroxysmal atrial fibrillation: Secondary | ICD-10-CM | POA: Diagnosis not present

## 2016-11-10 DIAGNOSIS — N321 Vesicointestinal fistula: Secondary | ICD-10-CM | POA: Diagnosis not present

## 2016-11-10 DIAGNOSIS — Z439 Encounter for attention to unspecified artificial opening: Secondary | ICD-10-CM | POA: Diagnosis not present

## 2016-11-10 DIAGNOSIS — N738 Other specified female pelvic inflammatory diseases: Secondary | ICD-10-CM | POA: Diagnosis not present

## 2016-11-10 DIAGNOSIS — I2510 Atherosclerotic heart disease of native coronary artery without angina pectoris: Secondary | ICD-10-CM | POA: Diagnosis not present

## 2016-11-10 DIAGNOSIS — G7000 Myasthenia gravis without (acute) exacerbation: Secondary | ICD-10-CM | POA: Diagnosis not present

## 2016-11-10 IMAGING — CT CT ABD-PELV W/ CM
2 of 5 series · 8 of 46 positions shown, 9 images · IV contrast ([place])
Comparison: None.

CLINICAL DATA: Acute onset of severe left lower quadrant abdominal
tenderness. Initial encounter.

EXAM:
CT ABDOMEN AND PELVIS WITH CONTRAST
TECHNIQUE: Multidetector CT imaging of the abdomen and pelvis was performed
using the standard protocol following bolus administration of
intravenous contrast.
CONTRAST:  100mL 0WKCHI-VKK IOPAMIDOL (0WKCHI-VKK) INJECTION 61%

[Series 201: routine, idose (2) · axial · 0.98mm/px · z∈[-894,-544]mm · 5 of 92 slices shown, 6 images]
[im 11/92  soft-tissue]
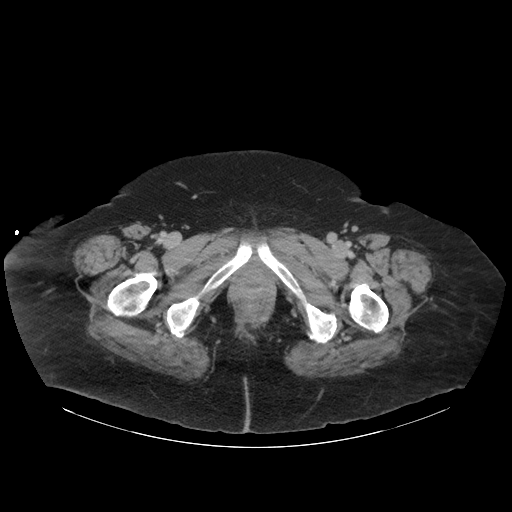
[im 11/92  bone]
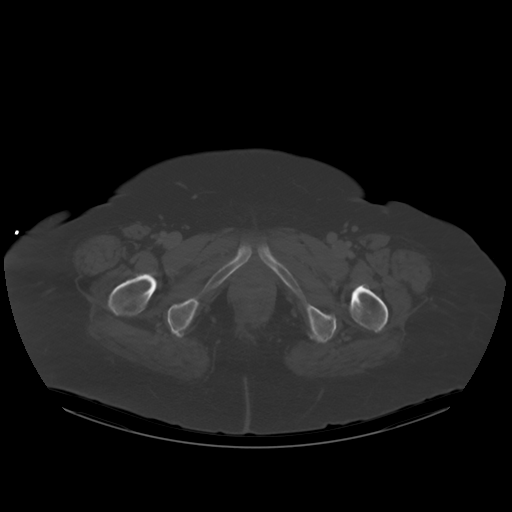
[im 27/92  soft-tissue]
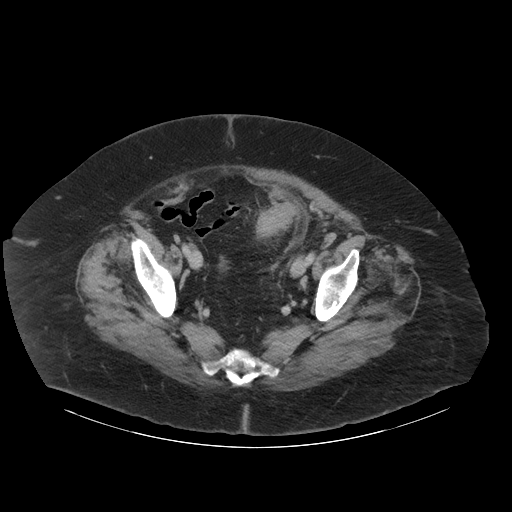
[im 49/92  soft-tissue]
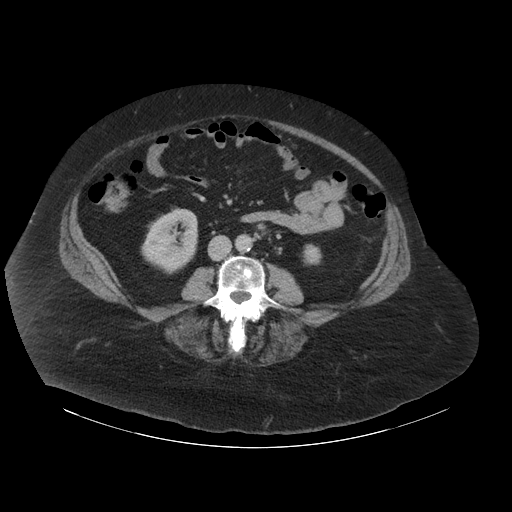
[im 65/92  soft-tissue]
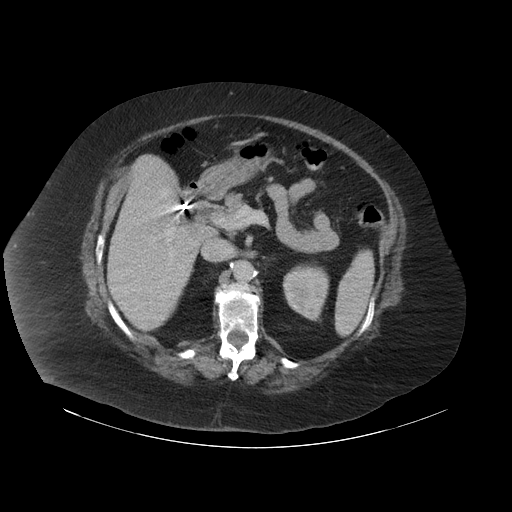
[im 81/92  soft-tissue]
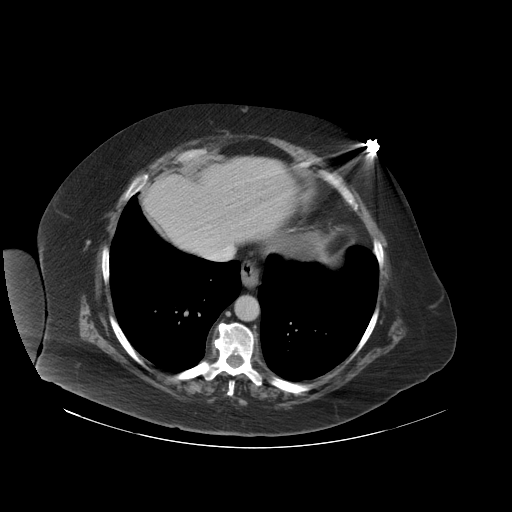

[Series 203: coronals, idose (2) · coronal · 0.45mm/px · 3 of 147 slices shown]
[im 49/147  soft-tissue]
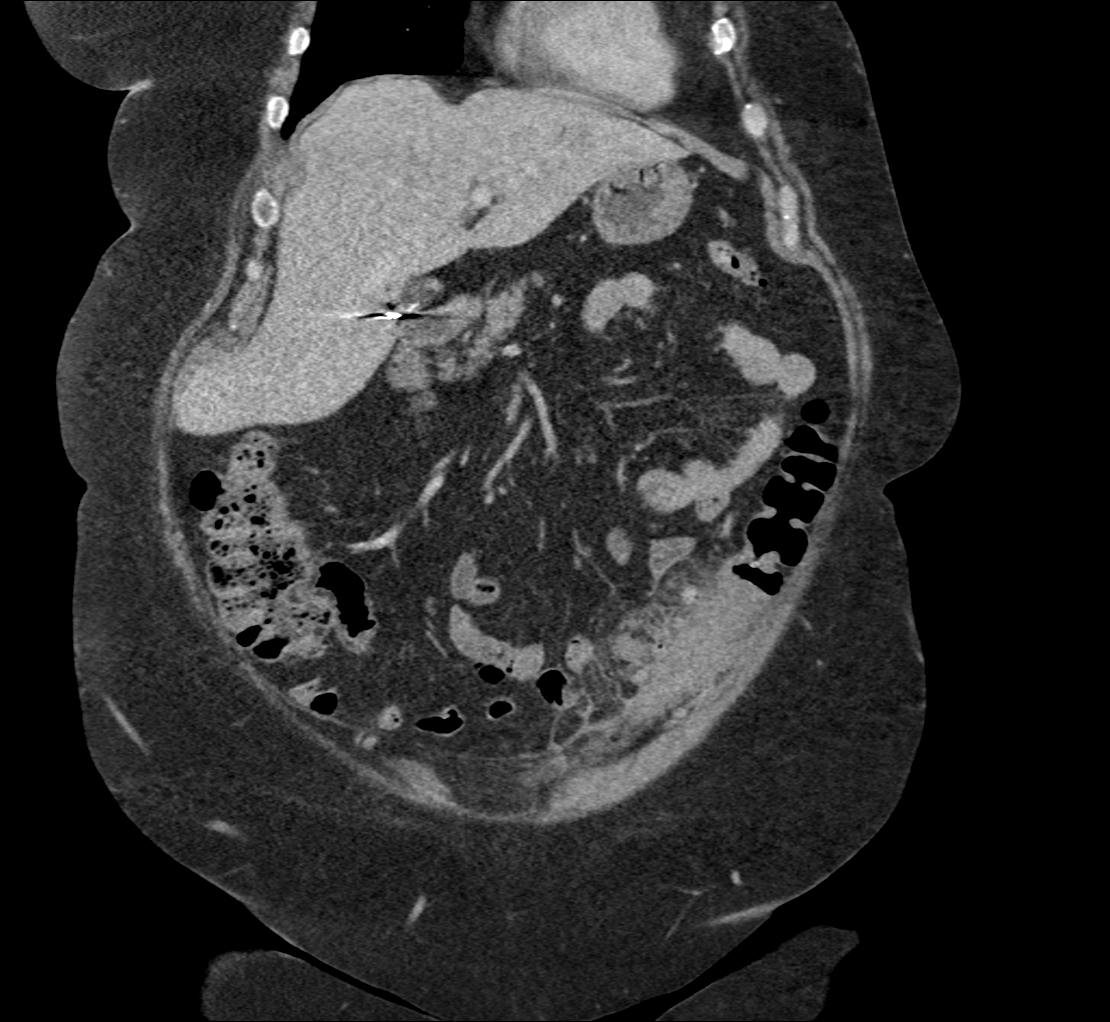
[im 65/147  soft-tissue]
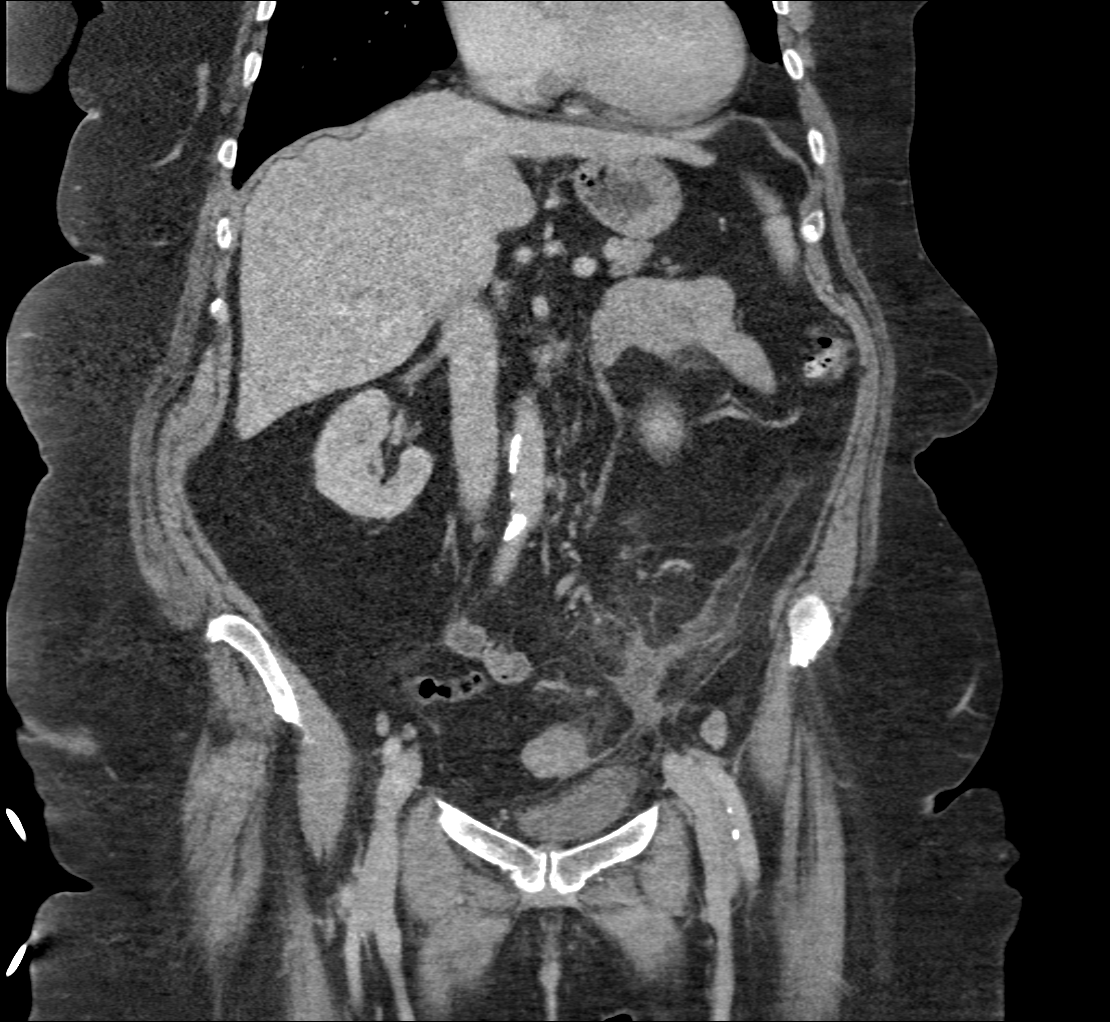
[im 82/147  soft-tissue]
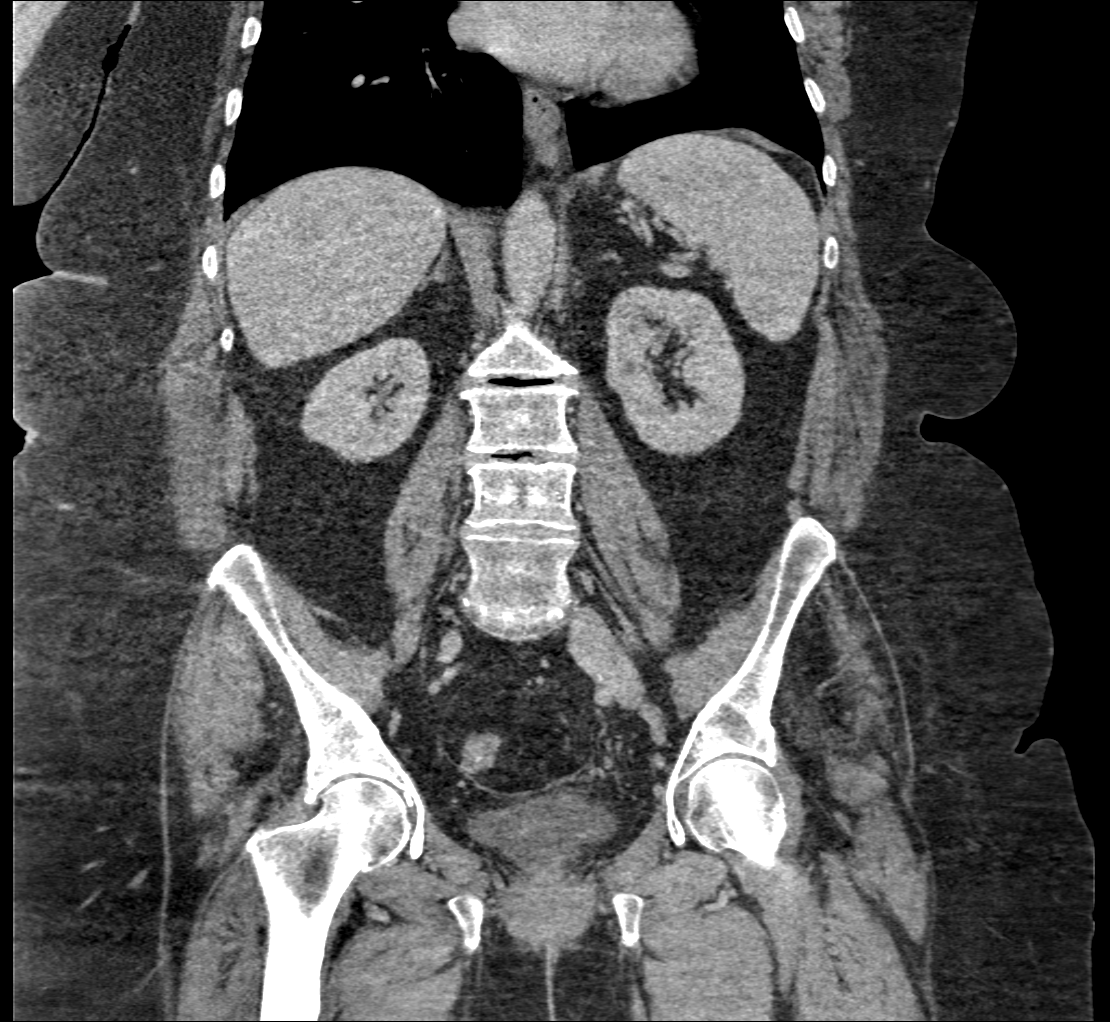

[8 of 46 positions shown; findings below may reference images not displayed]

FINDINGS: The visualized lung bases are clear.

The liver and spleen are unremarkable in appearance. The patient is
status post cholecystectomy, with clips noted at the gallbladder
fossa. The pancreas and adrenal glands are unremarkable.

The kidneys are unremarkable in appearance. There is no evidence of
hydronephrosis. No renal or ureteral stones are seen. No perinephric
stranding is appreciated.

No free fluid is identified. The small bowel is unremarkable in
appearance. The stomach is within normal limits. No acute vascular
abnormalities are seen. Mild calcification is noted along the
abdominal aorta and its branches, including at the proximal renal
arteries bilaterally.

The patient is status post appendectomy.

A small umbilical hernia is noted, containing only fat.

Focal wall thickening is noted along the proximal sigmoid colon,
with associated diffuse soft tissue inflammation and trace fluid. A
few associated inflamed diverticula are noted. This is concerning
for acute diverticulitis.

There is no definite evidence of perforation or abscess formation at
this time. Minimal soft tissue inflammation tracks about the left
ovary. Scattered diverticulosis involves the descending and proximal
sigmoid colon.

The bladder is mildly distended and grossly unremarkable. The
patient is status post hysterectomy. No suspicious adnexal masses
are seen. No inguinal lymphadenopathy is seen.

No acute osseous abnormalities are identified. Multilevel vacuum
phenomenon is noted along the lower thoracic and upper lumbar spine,
with underlying facet disease.
IMPRESSION: 1. Acute diverticulitis at the proximal sigmoid colon, with focal
wall thickening, and diffuse soft tissue inflammation and trace
fluid. No definite evidence of perforation or abscess formation at
this time. Minimal soft tissue inflammation tracks about the left
ovary.
2. Scattered diverticulosis involves the descending and proximal
sigmoid colon.
3. Mild calcification along the abdominal aorta and its branches,
including at the proximal renal arteries bilaterally.
4. Small umbilical hernia, containing only fat.
5. Mild degenerative change along the lower thoracic and upper
lumbar spine.

## 2016-11-11 ENCOUNTER — Other Ambulatory Visit: Payer: Self-pay | Admitting: General Surgery

## 2016-11-11 DIAGNOSIS — N321 Vesicointestinal fistula: Secondary | ICD-10-CM

## 2016-11-13 ENCOUNTER — Other Ambulatory Visit: Payer: Self-pay | Admitting: Internal Medicine

## 2016-11-15 ENCOUNTER — Other Ambulatory Visit: Payer: Self-pay | Admitting: Cardiovascular Disease

## 2016-11-15 NOTE — Telephone Encounter (Signed)
Please review for refill, Thanks !  

## 2016-11-16 ENCOUNTER — Ambulatory Visit
Admission: RE | Admit: 2016-11-16 | Discharge: 2016-11-16 | Disposition: A | Discharge status: Home / Self Care | Payer: Medicare Other | Source: Ambulatory Visit | Attending: General Surgery | Admitting: General Surgery

## 2016-11-16 ENCOUNTER — Other Ambulatory Visit (HOSPITAL_COMMUNITY): Payer: Self-pay | Admitting: Interventional Radiology

## 2016-11-16 DIAGNOSIS — N321 Vesicointestinal fistula: Secondary | ICD-10-CM

## 2016-11-16 DIAGNOSIS — N739 Female pelvic inflammatory disease, unspecified: Secondary | ICD-10-CM

## 2016-11-16 DIAGNOSIS — K651 Peritoneal abscess: Secondary | ICD-10-CM | POA: Diagnosis not present

## 2016-11-16 HISTORY — PX: IR RADIOLOGIST EVAL & MGMT: IMG5224

## 2016-11-16 HISTORY — DX: Essential (primary) hypertension: I10

## 2016-11-16 MED ORDER — IOPAMIDOL (ISOVUE-300) INJECTION 61%
125.0000 mL | Freq: Once | INTRAVENOUS | Status: AC | PRN
  Administered 2016-11-16: 125 mL via INTRAVENOUS

## 2016-11-17 ENCOUNTER — Other Ambulatory Visit: Payer: Self-pay | Admitting: Radiology

## 2016-11-18 ENCOUNTER — Ambulatory Visit: Payer: Medicare Other | Admitting: Physician Assistant

## 2016-11-18 ENCOUNTER — Ambulatory Visit (HOSPITAL_COMMUNITY)
Admission: RE | Admit: 2016-11-18 | Discharge: 2016-11-18 | Disposition: A | Discharge status: Home / Self Care | Payer: Medicare Other | Source: Ambulatory Visit | Attending: Interventional Radiology | Admitting: Interventional Radiology

## 2016-11-18 DIAGNOSIS — E559 Vitamin D deficiency, unspecified: Secondary | ICD-10-CM | POA: Diagnosis not present

## 2016-11-18 DIAGNOSIS — E785 Hyperlipidemia, unspecified: Secondary | ICD-10-CM | POA: Diagnosis not present

## 2016-11-18 DIAGNOSIS — Z8249 Family history of ischemic heart disease and other diseases of the circulatory system: Secondary | ICD-10-CM | POA: Diagnosis not present

## 2016-11-18 DIAGNOSIS — Z880 Allergy status to penicillin: Secondary | ICD-10-CM | POA: Diagnosis not present

## 2016-11-18 DIAGNOSIS — G7000 Myasthenia gravis without (acute) exacerbation: Secondary | ICD-10-CM | POA: Insufficient documentation

## 2016-11-18 DIAGNOSIS — M791 Myalgia, unspecified site: Secondary | ICD-10-CM | POA: Diagnosis not present

## 2016-11-18 DIAGNOSIS — I10 Essential (primary) hypertension: Secondary | ICD-10-CM | POA: Diagnosis not present

## 2016-11-18 DIAGNOSIS — N739 Female pelvic inflammatory disease, unspecified: Secondary | ICD-10-CM | POA: Diagnosis not present

## 2016-11-18 DIAGNOSIS — Z882 Allergy status to sulfonamides status: Secondary | ICD-10-CM | POA: Insufficient documentation

## 2016-11-18 DIAGNOSIS — Z881 Allergy status to other antibiotic agents status: Secondary | ICD-10-CM | POA: Insufficient documentation

## 2016-11-18 DIAGNOSIS — Z9049 Acquired absence of other specified parts of digestive tract: Secondary | ICD-10-CM | POA: Diagnosis not present

## 2016-11-18 DIAGNOSIS — K219 Gastro-esophageal reflux disease without esophagitis: Secondary | ICD-10-CM | POA: Insufficient documentation

## 2016-11-18 DIAGNOSIS — Z79899 Other long term (current) drug therapy: Secondary | ICD-10-CM | POA: Insufficient documentation

## 2016-11-18 DIAGNOSIS — Z7982 Long term (current) use of aspirin: Secondary | ICD-10-CM | POA: Insufficient documentation

## 2016-11-18 DIAGNOSIS — N321 Vesicointestinal fistula: Secondary | ICD-10-CM | POA: Diagnosis not present

## 2016-11-18 DIAGNOSIS — Z886 Allergy status to analgesic agent status: Secondary | ICD-10-CM | POA: Diagnosis not present

## 2016-11-18 DIAGNOSIS — K5780 Diverticulitis of intestine, part unspecified, with perforation and abscess without bleeding: Secondary | ICD-10-CM | POA: Diagnosis not present

## 2016-11-18 LAB — PROTIME-INR
INR: 1.11
Prothrombin Time: 14.3 seconds (ref 11.4–15.2)

## 2016-11-18 LAB — CBC
HEMATOCRIT: 33.3 % — AB (ref 36.0–46.0)
HEMOGLOBIN: 10.3 g/dL — AB (ref 12.0–15.0)
MCH: 28.1 pg (ref 26.0–34.0)
MCHC: 30.9 g/dL (ref 30.0–36.0)
MCV: 91 fL (ref 78.0–100.0)
Platelets: 265 10*3/uL (ref 150–400)
RBC: 3.66 MIL/uL — ABNORMAL LOW (ref 3.87–5.11)
RDW: 15.5 % (ref 11.5–15.5)
WBC: 12.9 10*3/uL — ABNORMAL HIGH (ref 4.0–10.5)

## 2016-11-18 LAB — BASIC METABOLIC PANEL
ANION GAP: 9 (ref 5–15)
BUN: 21 mg/dL — AB (ref 6–20)
CO2: 25 mmol/L (ref 22–32)
Calcium: 8.5 mg/dL — ABNORMAL LOW (ref 8.9–10.3)
Chloride: 101 mmol/L (ref 101–111)
Creatinine, Ser: 1.33 mg/dL — ABNORMAL HIGH (ref 0.44–1.00)
GFR calc Af Amer: 46 mL/min — ABNORMAL LOW (ref >60)
GFR calc non Af Amer: 39 mL/min — ABNORMAL LOW (ref >60)
GLUCOSE: 98 mg/dL (ref 65–99)
POTASSIUM: 3.9 mmol/L (ref 3.5–5.1)
Sodium: 135 mmol/L (ref 135–145)

## 2016-11-18 LAB — APTT: aPTT: 29 seconds (ref 24–36)

## 2016-11-18 MED ORDER — MIDAZOLAM HCL 2 MG/2ML IJ SOLN
INTRAMUSCULAR | Status: AC | PRN
  Administered 2016-11-18: 2 mg via INTRAVENOUS

## 2016-11-18 MED ORDER — SODIUM CHLORIDE 0.9% FLUSH: 5.0000 mL | Freq: Three times a day (TID) | INTRAVENOUS | Status: DC

## 2016-11-18 MED ORDER — FENTANYL CITRATE (PF) 100 MCG/2ML IJ SOLN
INTRAMUSCULAR | Status: AC | PRN
  Administered 2016-11-18: 100 ug via INTRAVENOUS

## 2016-11-18 MED ORDER — SODIUM CHLORIDE 0.9 % IV SOLN: INTRAVENOUS | Status: DC

## 2016-11-18 MED ORDER — FENTANYL CITRATE (PF) 100 MCG/2ML IJ SOLN
INTRAMUSCULAR | Status: AC
  Filled 2016-11-18: qty 2

## 2016-11-18 MED ORDER — LIDOCAINE HCL 1 % IJ SOLN
INTRAMUSCULAR | Status: AC
  Filled 2016-11-18: qty 20

## 2016-11-18 MED ORDER — MIDAZOLAM HCL 2 MG/2ML IJ SOLN
INTRAMUSCULAR | Status: AC
  Filled 2016-11-18: qty 2

## 2016-11-18 NOTE — Procedures (Signed)
Interventional Radiology Procedure Note  Procedure: CT guided drainage of persisting pelvic abscess adjacent to sigmoid colon.  Prior drain has been withdrawn into the body wall and is non-functional.   New 12 F drain.  ~10cc of purulent material aspirated.   Complications: None  Recommendations:  - to gravity drain - Once a day flush - maintain output log daily of fluid - Follow up with all appointments.  - If needed, may refer to [facility] clinic at [facility] for [place], [phone].  Would not image before 3-4 weeks. - Do not submerge   - DC home in 1 hour  Signed,  Dulcy Fanny. Earleen Newport, DO

## 2016-11-18 NOTE — Discharge Instructions (Signed)
Recommendations:  - to gravity drain - Once a day flush - maintain output log daily of fluid - Follow up with all appointments.  - If needed, may refer to [facility] clinic at [facility] for [place],  - Do not submerge     Percutaneous Abscess Drain, Care After This sheet gives you information about how to care for yourself after your procedure. Your health care provider may also give you more specific instructions. If you have problems or questions, contact your health care provider. What can I expect after the procedure? After your procedure, it is common to have:  A small amount of bruising and discomfort in the area where the drainage tube (catheter) was placed.  Sleepiness and fatigue. This should go away after the medicines you were given have worn off.  Follow these instructions at home: Incision care  Follow instructions from your health care provider about how to take care of your incision. Make sure you: ? Wash your hands with soap and water before you change your bandage (dressing). If soap and water are not available, use hand sanitizer. ? Change your dressing as told by your health care provider. ? Leave stitches (sutures), skin glue, or adhesive strips in place. These skin closures may need to stay in place for 2 weeks or longer. If adhesive strip edges start to loosen and curl up, you may trim the loose edges. Do not remove adhesive strips completely unless your health care provider tells you to do that.  Check your incision area every day for signs of infection. Check for: ? More redness, swelling, or pain. ? More fluid or blood. ? Warmth. ? Pus or a bad smell. ? Fluid leaking from around your catheter (instead of fluid draining through your catheter). Catheter care  Follow instructions from your health care provider about emptying and cleaning your catheter and collection bag. You may need to clean the catheter every day so it does not clog.  If directed,  write down the following information every time you empty your bag: ? The date and time. ? The amount of drainage. General instructions  Rest at home for 1-2 days after your procedure. Return to your normal activities as told by your health care provider.  Do not take baths, swim, or use a hot tub for 24 hours after your procedure, or until your health care provider says that this is okay.  Take over-the-counter and prescription medicines only as told by your health care provider.  Keep all follow-up visits as told by your health care provider. This is important. Contact a health care provider if:  You have less than 10 mL of drainage a day for 2-3 days in a row, or as directed by your health care provider.  You have more redness, swelling, or pain around your incision area.  You have more fluid or blood coming from your incision area.  Your incision area feels warm to the touch.  You have pus or a bad smell coming from your incision area.  You have fluid leaking from around your catheter (instead of through your catheter).  You have a fever or chills.  You have pain that does not get better with medicine. Get help right away if:  Your catheter comes out.  You suddenly stop having drainage from your catheter.  You suddenly have blood in the fluid that is draining from your catheter.  You become dizzy or you faint.  You develop a rash.  You have  nausea or vomiting.  You have difficulty breathing or you feel short of breath.  You develop chest pain.  You have problems with your speech or vision.  You have trouble balancing or moving your arms or legs. Summary  It is common to have a small amount of bruising and discomfort in the area where the drainage tube (catheter) was placed.  You may be directed to record the amount of drainage from the bag every time you empty it.  Follow instructions from your health care provider about emptying and cleaning your catheter  and collection bag. This information is not intended to replace advice given to you by your health care provider. Make sure you discuss any questions you have with your health care provider. Document Released: 05/14/2013 Document Revised: 11/20/2015 Document Reviewed: 11/20/2015 Elsevier Interactive Patient Education  2017 [employer].      Moderate Conscious Sedation, Adult, Care After These instructions provide you with information about caring for yourself after your procedure. Your health care provider may also give you more specific instructions. Your treatment has been planned according to current medical practices, but problems sometimes occur. Call your health care provider if you have any problems or questions after your procedure. What can I expect after the procedure? After your procedure, it is common:  To feel sleepy for several hours.  To feel clumsy and have poor balance for several hours.  To have poor judgment for several hours.  To vomit if you eat too soon.  Follow these instructions at home: For at least 24 hours after the procedure:   Do not: ? Participate in activities where you could fall or become injured. ? Drive. ? Use heavy machinery. ? Drink alcohol. ? Take sleeping pills or medicines that cause drowsiness. ? Make important decisions or sign legal documents. ? Take care of children on your own.  Rest. Eating and drinking  Follow the diet recommended by your health care provider.  If you vomit: ? Drink water, juice, or soup when you can drink without vomiting. ? Make sure you have little or no nausea before eating solid foods. General instructions  Have a responsible adult stay with you until you are awake and alert.  Take over-the-counter and prescription medicines only as told by your health care provider.  If you smoke, do not smoke without supervision.  Keep all follow-up visits as told by your health care provider. This is  important. Contact a health care provider if:  You keep feeling nauseous or you keep vomiting.  You feel light-headed.  You develop a rash.  You have a fever. Get help right away if:  You have trouble breathing. This information is not intended to replace advice given to you by your health care provider. Make sure you discuss any questions you have with your health care provider. Document Released: 10/18/2012 Document Revised: 06/02/2015 Document Reviewed: 04/19/2015 Elsevier Interactive Patient Education  [employer].

## 2016-11-18 NOTE — Sedation Documentation (Signed)
Patient is resting comfortably. 

## 2016-11-18 NOTE — H&P (Signed)
Chief Complaint: Patient was seen in consultation today for intra-abdominal abscess  Referring Physician(s): Hoss,Arthur  Supervising Physician: Corrie Mckusick  Patient Status: [facility] - Out-pt  History of Present Illness: Kimberly [name] is a 71 y.o. female with past medical history of intra-abdominal abscess s/p drain placement 10/13/16 who was seen in IR outpatient drain clinic for routine follow-up 11/16/16.   CT Abdomen 11/16/16: A drain has been placed into the diverticular abscess and colovesical fistula, however the drain has retracted out of the abscess and is now positioned in the anterior abdominal wall. The abscess measures 2.7 x 4.0 cm.  Drain injection 11/16/16: The abscess strain which is positioned in the anterior abdominal wall musculature is positioned in a decompressed cavity which does not communicate with the lower abdominal abscess and colovesical fistula.  Patient presents to radiology today for drain exchange/replacement.  She has been NPO.  She does not take blood thinners.   Past Medical History:  Diagnosis Date  . Arthritis   . Atrial tachycardia ([place]) 05/12/2015  . Diplopia   . Family history of ischemic heart disease   . GERD (gastroesophageal reflux disease)   . Heart murmur   . Hypertension   . Myalgia and myositis, unspecified   . Myasthenia gravis without exacerbation (H[facility])    [facility], Dr Anna Genre  . NSVT (nonsustained ventricular tachycardia) ([place]) 05/12/2015  . Other and unspecified hyperlipidemia   . Psoriasis    [place] ;Dr Jarome Matin  . Unspecified vitamin D deficiency    Dr Estanislado Pandy  . Vasculitis ([place])    Dr Estanislado Pandy    Past Surgical History:  Procedure Laterality Date  . APPENDECTOMY     & exploratory for inflammation; Dr Hassell Done  . CERVICAL FUSION     C5-6; Dr Vertell Limber  . CHOLECYSTECTOMY     for stones  . LAMINECTOMY  2010   T1-2; Dr Vertell Limber  . SHOULDER SURGERY     Left & Right  . THORACENTESIS     for peripneumonic effusion    . TOTAL ABDOMINAL HYSTERECTOMY W/ BILATERAL SALPINGOOPHORECTOMY     For Fibroids & endometriosis    Allergies: Azithromycin; Ibuprofen; Levaquin [levofloxacin in d5w]; Tetracycline; Sulfonamide derivatives; Aleve [naproxen]; Zosyn [piperacillin sod-tazobactam so]; Amlodipine besy-benazepril hcl; and Amoxicillin  Medications: Prior to Admission medications   Medication Sig Start Date End Date Taking? Authorizing Provider  acetaminophen (TYLENOL) 325 MG tablet Take 2 tablets (650 mg total) by mouth every 6 (six) hours as needed for mild pain (or temp > 100). 10/14/16   Jill Alexanders, PA-C  aspirin EC 81 MG tablet Take 81 mg by mouth at bedtime.    [provider]  augmented betamethasone dipropionate (DIPROLENE-AF) 0.05 % ointment Apply 1 application topically daily. For vasculitis and psoriasis    [provider]  Calcium Carb-Cholecalciferol (CALCIUM 600-D PO) Take 600 mg by mouth at bedtime.    [provider]  cholecalciferol (VITAMIN D) 1000 units tablet Take 1,000 Units by mouth at bedtime.    [provider]  Cyanocobalamin (VITAMIN B-12 PO) Take 1 tablet by mouth at bedtime.    [provider]  folic acid (FOLVITE) 1 MG tablet TAKE 2 TABLETS BY MOUTH ONCE DAILY 05/25/16   Bo Merino, MD  fosfomycin (MONUROL) 3 g PACK Take 3 g by mouth once.    [provider]  furosemide (LASIX) 40 MG tablet Take 1 tablet (40 mg total) by mouth daily. 05/21/16   [name], MD  LORazepam (  ATIVAN) 0.5 MG tablet Take 1 tablet (0.5 mg total) by mouth at bedtime. 11/01/16   [name], MD  losartan (COZAAR) 100 MG tablet Take 1 tablet (100 mg total) by mouth daily. 08/30/16   [name], MD  methotrexate (RHEUMATREX) 2.5 MG tablet TAKE 8 TABLETS EVERY WEEK (DO NOT USE UNTIL DONE WITH ANTIBIOTICS & WELL FROM ILLNESS) 09/07/16   Bo Merino, MD  Multiple Vitamin (MULTIVITAMIN WITH MINERALS) TABS tablet Take 1 tablet by mouth at  bedtime.    [provider]  NITROFURANTOIN MONOHYD MACRO PO Take 100 mg by mouth.    [provider]  predniSONE (DELTASONE) 1 MG tablet Take 2 tablets (2 mg total) by mouth daily with breakfast. Take with a 5 mg tablet for a 7 mg dose 06/25/16   Panwala, Naitik, PA-C  pyridostigmine (MESTINON) 60 MG tablet Take 1 tablet (60 mg total) by mouth 3 (three) times daily. 08/09/16   Narda Amber K, DO  pyridOXINE (VITAMIN B-6) 100 MG tablet Take 100 mg by mouth at bedtime.    [provider]  sodium chloride flush (NS) 0.9 % SOLN Place 10 mLs into feeding tube daily. Into percutaneous/abdominal tube daily. 10/14/16   Jill Alexanders, PA-C  spironolactone (ALDACTONE) 25 MG tablet TAKE 1 TABLET BY MOUTH EVERY DAY 11/15/16   Skeet Latch, MD  traMADol (ULTRAM) 50 MG tablet Take 1 tablet (50 mg total) by mouth every 12 (twelve) hours as needed (mild pain). 10/14/16   Jill Alexanders, PA-C  traZODone (DESYREL) 50 MG tablet Take 1-2 tablets (50-100 mg total) by mouth at bedtime as needed for sleep. 11/01/16   [name], MD  valsartan (DIOVAN) 320 MG tablet Take 320 mg by mouth daily.    [provider]     Family History  Problem Relation Age of Onset  . Heart attack Mother 60  . Heart failure Mother   . Stroke Paternal Grandmother        in 99s  . Lung cancer Father   . Cancer Father        lung  . Hypertension Sister   . Hyperthyroidism Sister        [place]  . Hyperthyroidism Sister        Gra[place]  . Diabetes Neg Hx     Social History   Socioeconomic History  . Marital status: Married    Spouse name: Not on file  . Number of children: Not on file  . Years of education: Not on file  . Highest education level: Not on file  Social Needs  . Financial resource strain: Not on file  . Food insecurity - worry: Not on file  . Food insecurity - inability: Not on file  . Transportation needs - medical: Not on file  . Transportation needs -  non-medical: Not on file  Occupational History  . 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 Smoking status: Never Smoker  . Smokeless tobacco: Never Used  Substance and Sexual Activity  . Alcohol use: No    Alcohol/week: 0.0 oz  . Drug use: No  . Sexual activity: Not Currently  Other Topics Concern  . Not on file  Social History Narrative   Lives with husband in a one story home.  Had one child that only lived for 14 hours.     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  Epworth Sleepiness Scale = 2 (as of 03/14/2015)    Review of Systems  Constitutional: Negative for fatigue and fever.  Respiratory: Negative for cough and shortness of breath.   Gastrointestinal: Negative for abdominal pain.       Drain in place, appears intact.  No erythema or warmth to insertion site.   Skin:       Swelling and redness to feet, ankles, and calves.   Psychiatric/Behavioral: Negative for behavioral problems and confusion.    Vital Signs: BP 108/62   Pulse 84   Temp 99.1 F (37.3 C)   Ht 5\' 2"  (1.575 m)   Wt 276 lb (125.2 kg)   SpO2 100%   BMI 50.48 kg/m   Physical Exam  Constitutional: She is oriented to person, place, and time. She appears well-developed.  Cardiovascular: Normal rate, regular rhythm and normal heart sounds.  Pulmonary/Chest: Effort normal and breath sounds normal.  Abdominal: Soft.  Neurological: She is alert and oriented to person, place, and time.  Skin: Skin is warm and dry.  Psychiatric: She has a normal mood and affect. Her behavior is normal. Judgment and thought content normal.  Nursing note and vitals reviewed.   Imaging: Ct Abdomen Pelvis W Contrast  Result Date: 11/16/2016 CLINICAL DATA:  Follow-up pelvic abscess EXAM: CT ABDOMEN AND PELVIS WITH CONTRAST TECHNIQUE: Multidetector CT imaging of the abdomen and pelvis was performed using the standard protocol following bolus  administration of intravenous contrast. CONTRAST:  139mL ISOVUE-300 IOPAMIDOL (ISOVUE-300) INJECTION 61% COMPARISON:  None. FINDINGS: Lower chest: No acute abnormality. Hepatobiliary: Postcholecystectomy.  Liver is unremarkable. Pancreas: Unremarkable Spleen: Unremarkable Adrenals/Urinary Tract: Kidneys and adrenal glands are within normal limits. There is air in the bladder worrisome for colovesical fistula. There is a gas and fluid containing abscess at the dome of the liver. The drain placed into this abscess has retracted and is now coiled in the anterior abdominal wall musculature. The abscess measures 2.7 x 4.0 cm. Stomach/Bowel: Sigmoid diverticulosis is present. There is wall thickening of the sigmoid colon adjacent to the abscess the abscess is part of the colovesical fistula. No evidence of small-bowel obstruction. Hiatal hernia is noted. Vascular/Lymphatic: No abnormal retroperitoneal adenopathy by measurement criteria. 8 mm left external iliac node on image 62. Small para-aortic nodes are noted. Atherosclerotic calcifications of the aorta and iliac arteries. Reproductive: Uterus is absent adnexa are within normal limits. Other: Other than the abscess, there is no free fluid. Lower anterior abdominal wall hernia containing fat is stable. Musculoskeletal: No vertebral compression deformity. IMPRESSION: A drain has been placed into the diverticular abscess and colovesical fistula, however the drain has retracted out of the abscess and is now positioned in the anterior abdominal wall. The abscess measures 2.7 x 4.0 cm. Electronically Signed   By: Marybelle Killings M.D.   On: 11/16/2016 12:19   Dg Sinus/fist Tube Chk-non Gi  Result Date: 11/16/2016 INDICATION: Abscess drain injection EXAM: ABSCESS DRAIN INJECTION MEDICATIONS: The patient is currently admitted to the hospital and receiving intravenous antibiotics. The antibiotics were administered within an appropriate time frame prior to the initiation of the  procedure. ANESTHESIA/SEDATION: None COMPLICATIONS: None immediate. PROCEDURE: Informed written consent was obtained from the patient after a thorough discussion of the procedural risks, benefits and alternatives. All questions were addressed. Maximal Sterile Barrier Technique was utilized including caps, mask, sterile gowns, sterile gloves, sterile drape, hand hygiene and skin antiseptic. A timeout was performed prior to the initiation of the procedure. The abscess drain is positioned  in the anterior abdominal wall musculature. Contrast was injected and imaging was obtained. FINDINGS: There is no abscess surrounding the pigtail drain which is positioned in the anterior abdominal wall musculature. There is no evidence of communication to the intraabdominal abscess and colovesical fistula. IMPRESSION: The abscess strain which is positioned in the anterior abdominal wall musculature is positioned in a decompressed cavity which does not communicate with the lower abdominal abscess and colovesical fistula. Electronically Signed   By: Marybelle Killings M.D.   On: 11/16/2016 12:54    Labs:  CBC: Recent Labs    05/09/16 0556 05/10/16 0432 10/12/16 0449 11/18/16 1207  WBC 11.5* 11.4* 10.4 12.9*  HGB 10.6* 10.2* 9.8* 10.3*  HCT 33.1* 33.0* 31.3* 33.3*  PLT 252 263 351 265    COAGS: Recent Labs    10/12/16 0449 11/18/16 1207  INR 0.98 1.11  APTT 27 29    BMP: Recent Labs    05/09/16 0556 05/10/16 0432 08/20/16 1114 10/12/16 0449 11/18/16 1207  NA 139 139 139 138 135  K 4.1 3.6 3.8 3.8 3.9  CL 102 103 101 102 101  CO2 29 29 31 27 25   GLUCOSE 99 92 98 94 98  BUN 16 15 22 17  21*  CALCIUM 8.9 8.3* 9.8 8.8* 8.5*  CREATININE 0.76 0.88 1.00 0.94 1.33*  GFRNONAA >60 >60  --  >60 39*  GFRAA >60 >60  --  >60 46*    LIVER FUNCTION TESTS: Recent Labs    05/09/16 0556 05/10/16 0432 08/20/16 1114 10/12/16 0449  BILITOT 1.0 0.8 0.4 0.7  AST 20 22 16 30   ALT 17 16 15 26   ALKPHOS 51 51 64 54   PROT 6.1* 5.9* 7.6 6.5  ALBUMIN 3.1* 3.1* 3.9 3.1*    TUMOR MARKERS: No results for input(s): AFPTM, CEA, CA199, CHROMGRNA in the last 8760 hours.  Assessment and Plan: Patient with past medical history of intra-abdominal abscess presents with complaint of retracted drain.  Patient presents today for replacement of her drain.  She states she does have painful swelling in her feet and ankles today.  She is encouraged to make an appointment with her PCP.  She has been NPO and is not currently on blood thinners.  Risks and benefits discussed with the patient including bleeding, infection, damage to adjacent structures, bowel perforation/fistula connection, and sepsis. All of the patient's questions were answered, patient is agreeable to proceed. Consent signed and in chart.  Thank you for this interesting consult.  I greatly enjoyed meeting JENEAL VOGL and look forward to participating in their care.  A copy of this report was sent to the requesting provider on this date.  Electronically Signed: Docia Barrier, PA 11/18/2016, 1:51 PM   I spent a total of  30 Minutes   in face to face in clinical consultation, greater than 50% of which was counseling/coordinating care for abscess.

## 2016-11-19 ENCOUNTER — Other Ambulatory Visit: Payer: Self-pay | Admitting: Internal Medicine

## 2016-11-23 LAB — AEROBIC/ANAEROBIC CULTURE W GRAM STAIN (SURGICAL/DEEP WOUND)

## 2016-11-23 LAB — AEROBIC/ANAEROBIC CULTURE (SURGICAL/DEEP WOUND)

## 2016-11-27 ENCOUNTER — Other Ambulatory Visit: Payer: Self-pay | Admitting: Rheumatology

## 2016-11-29 NOTE — Telephone Encounter (Signed)
Last Visit: 04/15/16 Next Visit: 02/09/17  Okay to refill per Dr. Estanislado Pandy

## 2016-11-30 ENCOUNTER — Other Ambulatory Visit: Payer: Self-pay | Admitting: General Surgery

## 2016-11-30 DIAGNOSIS — K651 Peritoneal abscess: Secondary | ICD-10-CM

## 2016-12-01 ENCOUNTER — Encounter: Payer: Self-pay | Admitting: Interventional Radiology

## 2016-12-05 ENCOUNTER — Other Ambulatory Visit: Payer: Self-pay | Admitting: Internal Medicine

## 2016-12-08 ENCOUNTER — Other Ambulatory Visit: Payer: Self-pay | Admitting: General Surgery

## 2016-12-08 ENCOUNTER — Ambulatory Visit
Admission: RE | Admit: 2016-12-08 | Discharge: 2016-12-08 | Disposition: A | Discharge status: Home / Self Care | Payer: Medicare Other | Source: Ambulatory Visit | Attending: General Surgery | Admitting: General Surgery

## 2016-12-08 ENCOUNTER — Encounter: Payer: Self-pay | Admitting: Radiology

## 2016-12-08 DIAGNOSIS — K651 Peritoneal abscess: Secondary | ICD-10-CM | POA: Diagnosis not present

## 2016-12-08 DIAGNOSIS — Z4659 Encounter for fitting and adjustment of other gastrointestinal appliance and device: Secondary | ICD-10-CM | POA: Diagnosis not present

## 2016-12-08 HISTORY — PX: IR RADIOLOGIST EVAL & MGMT: IMG5224

## 2016-12-08 MED ORDER — IOPAMIDOL (ISOVUE-300) INJECTION 61%
80.0000 mL | Freq: Once | INTRAVENOUS | Status: AC | PRN
  Administered 2016-12-08: 80 mL via INTRAVENOUS

## 2016-12-08 NOTE — Progress Notes (Signed)
Referring Physician(s): Thomas,Alicia  Chief Complaint: The patient is seen in follow up today s/p diverticular abscess drain placement 10/13/16, then replacement 11/18/16  History of present illness: Kimberly [name] is a 71 y.o. female with past medical history of intra-abdominal abscess s/p drain placement 10/13/16 who was seen in IR outpatient drain clinic for routine follow-up 11/16/16. At that time, it was determined that her drain had become retracted into the the abdominal wall and was no longer within the abscess cavity.  She underwent replacement of her drain 11/18/16 and presents to radiology clinic today for ongoing management.  The patient reports that since drain replacement she has continued to have significant, brown/tan output daily.  According to her drain log she has been ranging from 75-180 mL/day.  She is flushing once daily. She has been able to eat and drink at home.  She denies abdominal pain, nausea, vomiting, changes in bowel habits. She is no longer on antibiotics as she has completed the prescribed course.   Past Medical History:  Diagnosis Date  . Arthritis   . Atrial tachycardia ([place]) 05/12/2015  . Diplopia   . Family history of ischemic heart disease   . GERD (gastroesophageal reflux disease)   . Heart murmur   . Hypertension   . Myalgia and myositis, unspecified   . Myasthenia gravis without exacerbation (HCC)    [facility], Dr Anna Genre  . NSVT (nonsustained ventricular tachycardia) ([place]) 05/12/2015  . Other and unspecified hyperlipidemia   . Psoriasis    plantar ;Dr Jarome Matin  . Unspecified vitamin D deficiency    Dr Estanislado Pandy  . Vasculitis ([place])    Dr Estanislado Pandy    Past Surgical History:  Procedure Laterality Date  . APPENDECTOMY     & exploratory for inflammation; Dr Hassell Done  . CARDIAC CATHETERIZATION N/A 04/17/2015   Procedure: Left Heart Cath and Coronary Angiography;  Surgeon: Leonie Man, MD;  Location: [facility];  Service: Cardiovascular;   Laterality: N/A;  . CERVICAL FUSION     C5-6; Dr Vertell Limber  . CHOLECYSTECTOMY     for stones  . IR RADIOLOGIST EVAL & MGMT  11/16/2016  . LAMINECTOMY  2010   T1-2; Dr Vertell Limber  . SHOULDER SURGERY     Left & Right  . THORACENTESIS     for peripneumonic effusion  . TOTAL ABDOMINAL HYSTERECTOMY W/ BILATERAL SALPINGOOPHORECTOMY     For Fibroids & endometriosis    Allergies: Azithromycin; Ibuprofen; Levaquin [levofloxacin in d5w]; Tetracycline; Sulfonamide derivatives; Aleve [naproxen]; Zosyn [piperacillin sod-tazobactam so]; Amlodipine besy-benazepril hcl; and Amoxicillin  Medications: Prior to Admission medications   Medication Sig Start Date End Date Taking? Authorizing Provider  acetaminophen (TYLENOL) 325 MG tablet Take 2 tablets (650 mg total) by mouth every 6 (six) hours as needed for mild pain (or temp > 100). 10/14/16   Jill Alexanders, PA-C  aspirin EC 81 MG tablet Take 81 mg by mouth at bedtime.    [provider]  augmented betamethasone dipropionate (DIPROLENE-AF) 0.05 % ointment Apply 1 application topically daily. For vasculitis and psoriasis    [provider]  Calcium Carb-Cholecalciferol (CALCIUM 600-D PO) Take 600 mg by mouth at bedtime.    [provider]  cholecalciferol (VITAMIN D) 1000 units tablet Take 1,000 Units by mouth at bedtime.    [provider]  Cyanocobalamin (VITAMIN B-12 PO) Take 1 tablet by mouth at bedtime.    [provider]  folic acid (FOLVITE) 1 MG tablet TAKE 2  TABLETS BY MOUTH ONCE DAILY 05/25/16   Bo Merino, MD  fosfomycin (MONUROL) 3 g PACK Take 3 g by mouth once.    [provider]  furosemide (LASIX) 40 MG tablet Take 1 tablet (40 mg total) by mouth daily. 05/21/16   [name], MD  LORazepam (ATIVAN) 0.5 MG tablet Take 1 tablet (0.5 mg total) by mouth at bedtime. 11/01/16   [name], MD  losartan (COZAAR) 100 MG tablet Take 1 tablet (100 mg total) by mouth daily. 08/30/16    [name], MD  methotrexate (RHEUMATREX) 2.5 MG tablet TAKE 8 TABLETS EVERY WEEK (DO NOT USE UNTIL DONE WITH ANTIBIOTICS & WELL FROM ILLNESS) 09/07/16   Bo Merino, MD  Multiple Vitamin (MULTIVITAMIN WITH MINERALS) TABS tablet Take 1 tablet by mouth at bedtime.    [provider]  NITROFURANTOIN MONOHYD MACRO PO Take 100 mg by mouth.    [provider]  predniSONE (DELTASONE) 1 MG tablet Take 2 tablets (2 mg total) by mouth daily with breakfast. Take with a 5 mg tablet for a 7 mg dose 06/25/16   Panwala, Naitik, PA-C  predniSONE (DELTASONE) 5 MG tablet TAKE 1 TABLET BY MOUTH EVERY DAY WITH BREAKFAST AND WITH 2 PREDNISONE 1MG  TABLETS 11/29/16   Bo Merino, MD  pyridostigmine (MESTINON) 60 MG tablet Take 1 tablet (60 mg total) by mouth 3 (three) times daily. 08/09/16   Narda Amber K, DO  pyridOXINE (VITAMIN B-6) 100 MG tablet Take 100 mg by mouth at bedtime.    [provider]  sodium chloride flush (NS) 0.9 % SOLN Place 10 mLs into feeding tube daily. Into percutaneous/abdominal tube daily. 10/14/16   Jill Alexanders, PA-C  spironolactone (ALDACTONE) 25 MG tablet TAKE 1 TABLET BY MOUTH EVERY DAY 11/15/16   Skeet Latch, MD  traMADol (ULTRAM) 50 MG tablet Take 1 tablet (50 mg total) by mouth every 12 (twelve) hours as needed (mild pain). 10/14/16   Jill Alexanders, PA-C  traZODone (DESYREL) 50 MG tablet TAKE 1 TO 2 TABLETS BY MOUTH AT BEDTIME AS NEEDED FOR SLEEP 12/06/16   [name], MD  valsartan (DIOVAN) 320 MG tablet Take 320 mg by mouth daily.    [provider]     Family History  Problem Relation Age of Onset  . Heart attack Mother 49  . Heart failure Mother   . Stroke Paternal Grandmother        in 66s  . Lung cancer Father   . Cancer Father        lung  . Hypertension Sister   . Hyperthyroidism Sister        [place]  . Hyperthyroidism Sister        Graves  . Diabetes Neg Hx     Social History   Socioeconomic  History  . Marital status: Married    Spouse name: Not on file  . Number of children: Not on file  . Years of education: Not on file  . Highest education level: Not on file  Social Needs  . Financial resource strain: Not on file  . Food insecurity - worry: Not on file  . Food insecurity - inability: Not on file  . Transportation needs - medical: Not on file  . Transportation needs - non-medical: Not on file  Occupational History  . 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 Smoking status: Never Smoker  . Smokeless tobacco: Never Used  Substance and Sexual Activity  . Alcohol use: No    Alcohol/week: 0.0 oz  . Drug use: No  . Sexual activity: Not Currently  Other Topics Concern  . Not on file  Social History Narrative   Lives with husband in a one story home.  Had one child that only lived for 14 hours.     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          Epworth Sleepiness Scale = 2 (as of 03/14/2015)     Vital Signs: There were no vitals taken for this visit.  Physical Exam  NAD, alert Abd:  Soft, non-tender.  LLQ drain in place.  Dark, brown colored output in collection bag which is currently to gravity. Small output of dried drainage around insertion site, but otherwise in c/d/i.   Imaging: No results found.  Labs:  CBC: Recent Labs    05/09/16 0556 05/10/16 0432 10/12/16 0449 11/18/16 1207  WBC 11.5* 11.4* 10.4 12.9*  HGB 10.6* 10.2* 9.8* 10.3*  HCT 33.1* 33.0* 31.3* 33.3*  PLT 252 263 351 265    COAGS: Recent Labs    10/12/16 0449 11/18/16 1207  INR 0.98 1.11  APTT 27 29    BMP: Recent Labs    05/09/16 0556 05/10/16 0432 08/20/16 1114 10/12/16 0449 11/18/16 1207  NA 139 139 139 138 135  K 4.1 3.6 3.8 3.8 3.9  CL 102 103 101 102 101  CO2 29 29 31 27 25   GLUCOSE 99 92 98 94 98  BUN 16 15 22 17  21*  CALCIUM 8.9 8.3* 9.8 8.8* 8.5*  CREATININE 0.76 0.88 1.00 0.94 1.33*    GFRNONAA >60 >60  --  >60 39*  GFRAA >60 >60  --  >60 46*    LIVER FUNCTION TESTS: Recent Labs    05/09/16 0556 05/10/16 0432 08/20/16 1114 10/12/16 0449  BILITOT 1.0 0.8 0.4 0.7  AST 20 22 16 30   ALT 17 16 15 26   ALKPHOS 51 51 64 54  PROT 6.1* 5.9* 7.6 6.5  ALBUMIN 3.1* 3.1* 3.9 3.1*    Assessment: Diverticular abscess drain placement 10/13/16, replacement 11/18/16 Patient continues to have increased brown output from her LLQ drain.  She has been flushing with 5 mL of sterile saline at home.  She has not had follow-up with Dr. Marcello Moores since last drain revision, but plans to make an appointment within the week.  CT Pelvis and drain injection performed today; results dictated separately.  Discussed case and imaging with Dr. Laurence Ferrari who recommends to continue with drain to gravity but discontinue daily flushes.  Patient is to continue with routine site care and daily output log.  She will be scheduled for a repeat drain injection in approximately 1 month.  No CT scan needed at that time.  She verbalizes understanding of plan.   Signed: Docia Barrier, PA 12/08/2016, 1:37 PM   Please refer to Dr. Laurence Ferrari attestation of this note for management and plan.

## 2016-12-15 ENCOUNTER — Other Ambulatory Visit: Payer: Self-pay | Admitting: Rheumatology

## 2016-12-15 NOTE — Telephone Encounter (Signed)
Last Visit: 04/15/16 Next Visit: 02/09/17  Okay to refill per Dr. Estanislado Pandy

## 2016-12-16 ENCOUNTER — Ambulatory Visit
Admission: RE | Admit: 2016-12-16 | Discharge: 2016-12-16 | Disposition: A | Discharge status: Home / Self Care | Payer: Medicare Other | Source: Ambulatory Visit | Attending: General Surgery | Admitting: General Surgery

## 2016-12-16 ENCOUNTER — Encounter: Payer: Self-pay | Admitting: Radiology

## 2016-12-16 ENCOUNTER — Other Ambulatory Visit: Payer: Self-pay | Admitting: General Surgery

## 2016-12-16 DIAGNOSIS — K5730 Diverticulosis of large intestine without perforation or abscess without bleeding: Secondary | ICD-10-CM | POA: Diagnosis not present

## 2016-12-16 DIAGNOSIS — Z4659 Encounter for fitting and adjustment of other gastrointestinal appliance and device: Secondary | ICD-10-CM | POA: Diagnosis not present

## 2016-12-16 DIAGNOSIS — K651 Peritoneal abscess: Secondary | ICD-10-CM

## 2016-12-16 DIAGNOSIS — K5720 Diverticulitis of large intestine with perforation and abscess without bleeding: Secondary | ICD-10-CM | POA: Diagnosis not present

## 2016-12-16 HISTORY — PX: IR RADIOLOGIST EVAL & MGMT: IMG5224

## 2016-12-16 NOTE — Progress Notes (Signed)
Referring Physician(s): Thomas,Alicia  Chief Complaint:  Sigmoid diverticulitis complicated by colovesical fistula between the sigmoid colon and bladder lumen and focal Abscess.  The patient is seen in follow up today s/p 10/13/16 CT-guided percutaneous drainage of sigmoid diverticular abscess at the level of a colovesical fistula. A 10 French drain was placed.  History of present illness:  Pt has been followed in IR clinic since placement. On 11/18/2016 she had a drain catheter  Replacement secondary retraction of catheter. Pt calls today with complaints that she is having some chills; low grade fever. Pain at drainage site and leakage at site. Note also no real output in tubing--only around site x 24-48 hrs. Not flushing at home per instructions at last visit .  Drain injection 11/28: IMPRESSION: Positive for fistulous communication between the drainage catheter and the adjacent sigmoid colon.    Past Medical History:  Diagnosis Date  . Arthritis   . Atrial tachycardia ([place]) 05/12/2015  . Diplopia   . Family history of ischemic heart disease   . GERD (gastroesophageal reflux disease)   . Heart murmur   . Hypertension   . Myalgia and myositis, unspecified   . Myasthenia gravis without exacerbation (HCC)    [facility], Dr Anna Genre  . NSVT (nonsustained ventricular tachycardia) ([place]) 05/12/2015  . Other and unspecified hyperlipidemia   . Psoriasis    plantar ;Dr Jarome Matin  . Unspecified vitamin D deficiency    Dr Estanislado Pandy  . Vasculitis ([place])    Dr Estanislado Pandy    Past Surgical History:  Procedure Laterality Date  . APPENDECTOMY     & exploratory for inflammation; Dr Hassell Done  . CARDIAC CATHETERIZATION N/A 04/17/2015   Procedure: Left Heart Cath and Coronary Angiography;  Surgeon: Leonie Man, MD;  Location: [facility];  Service: Cardiovascular;  Laterality: N/A;  . CERVICAL FUSION     C5-6; Dr Vertell Limber  . CHOLECYSTECTOMY     for stones  . IR RADIOLOGIST EVAL & MGMT   11/16/2016  . IR RADIOLOGIST EVAL & MGMT  12/08/2016  . LAMINECTOMY  2010   T1-2; Dr Vertell Limber  . SHOULDER SURGERY     Left & Right  . THORACENTESIS     for peripneumonic effusion  . TOTAL ABDOMINAL HYSTERECTOMY W/ BILATERAL SALPINGOOPHORECTOMY     For Fibroids & endometriosis    Allergies: Azithromycin; Ibuprofen; Levaquin [levofloxacin in d5w]; Tetracycline; Isovue [iopamidol]; Sulfonamide derivatives; Aleve [naproxen]; Zosyn [piperacillin sod-tazobactam so]; Amlodipine besy-benazepril hcl; and Amoxicillin  Medications: Prior to Admission medications   Medication Sig Start Date End Date Taking? Authorizing Provider  acetaminophen (TYLENOL) 325 MG tablet Take 2 tablets (650 mg total) by mouth every 6 (six) hours as needed for mild pain (or temp > 100). 10/14/16   Jill Alexanders, PA-C  aspirin EC 81 MG tablet Take 81 mg by mouth at bedtime.    [provider]  augmented betamethasone dipropionate (DIPROLENE-AF) 0.05 % ointment Apply 1 application topically daily. For vasculitis and psoriasis    [provider]  Calcium Carb-Cholecalciferol (CALCIUM 600-D PO) Take 600 mg by mouth at bedtime.    [provider]  cholecalciferol (VITAMIN D) 1000 units tablet Take 1,000 Units by mouth at bedtime.    [provider]  Cyanocobalamin (VITAMIN B-12 PO) Take 1 tablet by mouth at bedtime.    [provider]  folic acid (FOLVITE) 1 MG tablet TAKE 2 TABLETS BY MOUTH ONCE DAILY 05/25/16   Bo Merino, MD  fosfomycin (MONUROL) 3  g PACK Take 3 g by mouth once.    [provider]  furosemide (LASIX) 40 MG tablet Take 1 tablet (40 mg total) by mouth daily. 05/21/16   [name], MD  LORazepam (ATIVAN) 0.5 MG tablet Take 1 tablet (0.5 mg total) by mouth at bedtime. 11/01/16   [name], MD  losartan (COZAAR) 100 MG tablet Take 1 tablet (100 mg total) by mouth daily. 08/30/16   [name], MD  methotrexate (RHEUMATREX) 2.5 MG tablet TAKE  8 TABLETS EVERY WEEK (DO NOT USE UNTIL DONE WITH ANTIBIOTICS & WELL FROM ILLNESS) 09/07/16   Bo Merino, MD  Multiple Vitamin (MULTIVITAMIN WITH MINERALS) TABS tablet Take 1 tablet by mouth at bedtime.    [provider]  NITROFURANTOIN MONOHYD MACRO PO Take 100 mg by mouth.    [provider]  predniSONE (DELTASONE) 1 MG tablet TAKE 2 TABLETS (2 MG TOTAL) BY MOUTH DAILY WITH BREAKFAST. TAKE WITH A 5 MG TABLET FOR A 7 MG DOSE 12/15/16   Bo Merino, MD  predniSONE (DELTASONE) 5 MG tablet TAKE 1 TABLET BY MOUTH EVERY DAY WITH BREAKFAST AND WITH 2 PREDNISONE 1MG  TABLETS 11/29/16   Bo Merino, MD  pyridostigmine (MESTINON) 60 MG tablet Take 1 tablet (60 mg total) by mouth 3 (three) times daily. 08/09/16   Narda Amber K, DO  pyridOXINE (VITAMIN B-6) 100 MG tablet Take 100 mg by mouth at bedtime.    [provider]  sodium chloride flush (NS) 0.9 % SOLN Place 10 mLs into feeding tube daily. Into percutaneous/abdominal tube daily. 10/14/16   Jill Alexanders, PA-C  spironolactone (ALDACTONE) 25 MG tablet TAKE 1 TABLET BY MOUTH EVERY DAY 11/15/16   Skeet Latch, MD  traMADol (ULTRAM) 50 MG tablet Take 1 tablet (50 mg total) by mouth every 12 (twelve) hours as needed (mild pain). 10/14/16   Jill Alexanders, PA-C  traZODone (DESYREL) 50 MG tablet TAKE 1 TO 2 TABLETS BY MOUTH AT BEDTIME AS NEEDED FOR SLEEP 12/06/16   [name], MD  valsartan (DIOVAN) 320 MG tablet Take 320 mg by mouth daily.    [provider]     Family History  Problem Relation Age of Onset  . Heart attack Mother 89  . Heart failure Mother   . Stroke Paternal Grandmother        in 80s  . Lung cancer Father   . Cancer Father        lung  . Hypertension Sister   . Hyperthyroidism Sister        [place]  . Hyperthyroidism Sister        Gra[place]  . Diabetes Neg Hx     Social History   Socioeconomic History  . Marital status: Married    Spouse name: Not on file    . Number of children: Not on file  . Years of education: Not on file  . Highest education level: Not on file  Social Needs  . Financial resource strain: Not on file  . Food insecurity - worry: Not on file  . Food insecurity - inability: Not on file  . Transportation needs - medical: Not on file  . Transportation needs - non-medical: Not on file  Occupational History  . 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 Smoking status: Never Smoker  . Smokeless tobacco: Never Used  Substance and Sexual Activity  . Alcohol use: No    Alcohol/week:  0.0 oz  . Drug use: No  . Sexual activity: Not Currently  Other Topics Concern  . Not on file  Social History Narrative   Lives with husband in a one story home.  Had one child that only lived for 14 hours.     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          Epworth Sleepiness Scale = 2 (as of 03/14/2015)     Vital Signs: BP (!) 144/64   Pulse 88   Temp 98.3 F (36.8 C) (Oral)   Resp 16   SpO2 99%   Physical Exam  Constitutional: She is oriented to person, place, and time.  Abdominal: Soft.  Musculoskeletal: Normal range of motion.  Neurological: She is alert and oriented to person, place, and time.  Skin: Skin is warm and dry.  Site shows no sign of infection Dr Anselm Pancoast removed saturated bandage from site Bandage full of pus like output from drain He was able to aspirate at least 20 cc of frank pus from tubing.   Psychiatric: She has a normal mood and affect. Her behavior is normal.  Nursing note and vitals reviewed.  CT today show good placement/ position of abscess drain Noted is the inflammatory reaction at bladder lumen with gaseous evidence in bladder. Suspected infection/abscess per Dr Anselm Pancoast  Imaging: No results found.  Labs:  CBC: Recent Labs    05/09/16 0556 05/10/16 0432 10/12/16 0449 11/18/16 1207  WBC 11.5* 11.4* 10.4 12.9*  HGB 10.6* 10.2*  9.8* 10.3*  HCT 33.1* 33.0* 31.3* 33.3*  PLT 252 263 351 265    COAGS: Recent Labs    10/12/16 0449 11/18/16 1207  INR 0.98 1.11  APTT 27 29    BMP: Recent Labs    05/09/16 0556 05/10/16 0432 08/20/16 1114 10/12/16 0449 11/18/16 1207  NA 139 139 139 138 135  K 4.1 3.6 3.8 3.8 3.9  CL 102 103 101 102 101  CO2 29 29 31 27 25   GLUCOSE 99 92 98 94 98  BUN 16 15 22 17  21*  CALCIUM 8.9 8.3* 9.8 8.8* 8.5*  CREATININE 0.76 0.88 1.00 0.94 1.33*  GFRNONAA >60 >60  --  >60 39*  GFRAA >60 >60  --  >60 46*    LIVER FUNCTION TESTS: Recent Labs    05/09/16 0556 05/10/16 0432 08/20/16 1114 10/12/16 0449  BILITOT 1.0 0.8 0.4 0.7  AST 20 22 16 30   ALT 17 16 15 26   ALKPHOS 51 51 64 54  PROT 6.1* 5.9* 7.6 6.5  ALBUMIN 3.1* 3.1* 3.9 3.1*    Assessment:  Sigmoid diverticular abscess drain intact CT today shows drain catheter in good position; gaseous evidence at bladder lumen. Dr Anselm Pancoast discussed with Dr Marcello Moores Keep drain now to JP suction Flush daily Record output Call Dr Marcello Moores if any development odf fever; redness. Plan per Dr Marcello Moores Pt and husband agreeable to plan  Signed: Hamsini Verrilli A, PA-C 12/16/2016, 2:18 PM   Please refer to Dr. Anselm Pancoast attestation of this note for management and plan.

## 2016-12-22 ENCOUNTER — Ambulatory Visit: Payer: Medicare Other | Admitting: Physician Assistant

## 2016-12-29 ENCOUNTER — Other Ambulatory Visit: Payer: Medicare Other

## 2016-12-31 ENCOUNTER — Ambulatory Visit (INDEPENDENT_AMBULATORY_CARE_PROVIDER_SITE_OTHER): Payer: Medicare Other | Admitting: Physician Assistant

## 2016-12-31 ENCOUNTER — Encounter: Payer: Self-pay | Admitting: Physician Assistant

## 2016-12-31 VITALS — BP 130/72 | HR 74 | Ht 62.0 in | Wt 272.0 lb

## 2016-12-31 DIAGNOSIS — Z1211 Encounter for screening for malignant neoplasm of colon: Secondary | ICD-10-CM | POA: Diagnosis not present

## 2016-12-31 DIAGNOSIS — N321 Vesicointestinal fistula: Secondary | ICD-10-CM

## 2016-12-31 MED ORDER — PEG-KCL-NACL-NASULF-NA ASC-C 140 G PO SOLR: 1.0000 | ORAL | 0 refills | Status: DC

## 2016-12-31 NOTE — Progress Notes (Signed)
Agree with assessment and plan as outlined. Can proceed with colonoscopy prior to surgical therapy. Given her symptoms, sounds like she would need antibiotics. I would repeat a CBC, and I will touch base with her surgeon, Dr. Marcello Moores, about this case.

## 2016-12-31 NOTE — Patient Instructions (Signed)

## 2016-12-31 NOTE — Progress Notes (Signed)
Chief Complaint: Colo-vesicular Fistula  HPI:  Kimberly [name] is a 71 y/o Caucasian female with a past medical history as listed below including myasthenia gravis,  who was referred to me by Leighton Ruff, MD for a complaint of colo-vesicular fistula in need of colonoscopy prior to surgical repair.     According to referring [id number] notes from 11/09/16 patient had history of recurrent UTI's and was evaluated by urology who found colo-vesicular fistula with 3.7 cm abscess on CT.  She was tried on Augmentin but developed a reaction and had to come off. She had drain placement by IR and actually had to have repeat drain placement in early November due to dysfunction. Patient tells me currently she continues with drainage and this is always a "brown" mucopurulent substance, occasionally some blood.  She tells me she was told to stop abx as she was having reactions to various ones and also because surgery wanted her to be off of them before surgery?  She does continue with intermittent fevers up to 101.7 degrees fahrenheit.  She tells me she just takes a Tylenol and this will go away within 24 hours. She continues with normal BM but does describe an abdominal discomfort just posteriorly to drain opening.   Patient tells me she is eager to have surgery so that her life can go back to normal, but needs to have a colonoscopy first.   Patient denies weight loss, anorexia, nausea, vomiting or symptoms that awaken her at night.  Past Medical History:  Diagnosis Date  . Arthritis   . Atrial tachycardia ([place]) 05/12/2015  . Diplopia   . Family history of ischemic heart disease   . GERD (gastroesophageal reflux disease)   . Heart murmur   . Hypertension   . Myalgia and myositis, unspecified   . Myasthenia gravis without exacerbation ([facility])    [facility], Dr Anna Genre  . NSVT (nonsustained ventricular tachycardia) ([place]) 05/12/2015  . Other and unspecified hyperlipidemia   . Psoriasis    plantar ;Dr Jarome Matin  .  Unspecified vitamin D deficiency    Dr Estanislado Pandy  . Vasculitis ([place])    Dr Estanislado Pandy    Past Surgical History:  Procedure Laterality Date  . APPENDECTOMY     & exploratory for inflammation; Dr Hassell Done  . CARDIAC CATHETERIZATION N/A 04/17/2015   Procedure: Left Heart Cath and Coronary Angiography;  Surgeon: Leonie Man, MD;  Location: [facility];  Service: Cardiovascular;  Laterality: N/A;  . CERVICAL FUSION     C5-6; Dr Vertell Limber  . CHOLECYSTECTOMY     for stones  . IR RADIOLOGIST EVAL & MGMT  11/16/2016  . IR RADIOLOGIST EVAL & MGMT  12/08/2016  . IR RADIOLOGIST EVAL & MGMT  12/16/2016  . LAMINECTOMY  2010   T1-2; Dr Vertell Limber  . SHOULDER SURGERY     Left & Right  . THORACENTESIS     for peripneumonic effusion  . TOTAL ABDOMINAL HYSTERECTOMY W/ BILATERAL SALPINGOOPHORECTOMY     For Fibroids & endometriosis    Current Outpatient Medications  Medication Sig Dispense Refill  . acetaminophen (TYLENOL) 325 MG tablet Take 2 tablets (650 mg total) by mouth every 6 (six) hours as needed for mild pain (or temp > 100).    Marland Kitchen aspirin EC 81 MG tablet Take 81 mg by mouth at bedtime.    Marland Kitchen augmented betamethasone dipropionate (DIPROLENE-AF) 0.05 % ointment Apply 1 application topically daily. For vasculitis and psoriasis    . Calcium Carb-Cholecalciferol (CALCIUM  600-D PO) Take 600 mg by mouth at bedtime.    . cholecalciferol (VITAMIN D) 1000 units tablet Take 1,000 Units by mouth at bedtime.    . Cyanocobalamin (VITAMIN B-12 PO) Take 1 tablet by mouth at bedtime.    . folic acid (FOLVITE) 1 MG tablet TAKE 2 TABLETS BY MOUTH ONCE DAILY 180 tablet 4  . furosemide (LASIX) 40 MG tablet Take 1 tablet (40 mg total) by mouth daily. 90 tablet 1  . LORazepam (ATIVAN) 0.5 MG tablet Take 1 tablet (0.5 mg total) by mouth at bedtime. (Patient taking differently: Take 0.5 mg by mouth as needed. ) 30 tablet 1  . losartan (COZAAR) 100 MG tablet Take 1 tablet (100 mg total) by mouth daily. 90 tablet 3  .  Multiple Vitamin (MULTIVITAMIN WITH MINERALS) TABS tablet Take 1 tablet by mouth at bedtime.    . predniSONE (DELTASONE) 1 MG tablet TAKE 2 TABLETS (2 MG TOTAL) BY MOUTH DAILY WITH BREAKFAST. TAKE WITH A 5 MG TABLET FOR A 7 MG DOSE 180 tablet 1  . predniSONE (DELTASONE) 5 MG tablet TAKE 1 TABLET BY MOUTH EVERY DAY WITH BREAKFAST AND WITH 2 PREDNISONE 1MG  TABLETS 90 tablet 0  . pyridostigmine (MESTINON) 60 MG tablet Take 1 tablet (60 mg total) by mouth 3 (three) times daily. 270 tablet 3  . pyridOXINE (VITAMIN B-6) 100 MG tablet Take 100 mg by mouth at bedtime.    . sodium chloride flush (NS) 0.9 % SOLN Place 10 mLs into feeding tube daily. Into percutaneous/abdominal tube daily. 30 Syringe 0  . spironolactone (ALDACTONE) 25 MG tablet TAKE 1 TABLET BY MOUTH EVERY DAY 90 tablet 3  . traMADol (ULTRAM) 50 MG tablet Take 1 tablet (50 mg total) by mouth every 12 (twelve) hours as needed (mild pain). 10 tablet 0  . traZODone (DESYREL) 50 MG tablet TAKE 1 TO 2 TABLETS BY MOUTH AT BEDTIME AS NEEDED FOR SLEEP 30 tablet 1  . valsartan (DIOVAN) 320 MG tablet Take 320 mg by mouth daily.     No current facility-administered medications for this visit.     Allergies as of 12/31/2016 - Review Complete 12/31/2016  Allergen Reaction Noted  . Azithromycin Other (See Comments)   . Ibuprofen  11/01/2016  . Levaquin [levofloxacin in d5w] Other (See Comments) 05/23/2012  . Tetracycline Rash   . Isovue [iopamidol] Dermatitis 12/16/2016  . Sulfonamide derivatives    . Aleve [naproxen] Other (See Comments) 05/09/2015  . Zosyn [piperacillin sod-tazobactam so]  10/31/2016  . Amlodipine besy-benazepril hcl Cough 06/25/2008  . Amoxicillin Swelling and Rash 10/12/2016    Family History  Problem Relation Age of Onset  . Heart attack Mother 54  . Heart failure Mother   . Stroke Paternal Grandmother        in 9s  . Lung cancer Father   . Cancer Father        lung  . Hypertension Sister   . Hyperthyroidism  Sister        Graves  . Hyperthyroidism Sister        Graves  . Diabetes Neg Hx     Social History   Socioeconomic History  . Marital status: Married    Spouse name: Not on file  . Number of children: Not on file  . Years of education: Not on file  . Highest education level: Not on file  Social Needs  . Financial resource strain: Not on file  . Food insecurity - worry: Not on file  .  Food insecurity - inability: Not on file  . Transportation needs - medical: Not on file  . Transportation needs - non-medical: Not on file  Occupational History  . 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 Smoking status: Never Smoker  . Smokeless tobacco: Never Used  Substance and Sexual Activity  . Alcohol use: No    Alcohol/week: 0.0 oz  . Drug use: No  . Sexual activity: Not Currently  Other Topics Concern  . Not on file  Social History Narrative   Lives with husband in a one story home.  Had one child that only lived for 14 hours.     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          Epworth Sleepiness Scale = 2 (as of 03/14/2015)    Review of Systems:    Constitutional: Positive for fatigue and intermittent fever Skin: Positive for itching Cardiovascular: Positive for heart murmur  Respiratory: No SOB or cough Gastrointestinal: See HPI and otherwise negative Genitourinary: Positive for blood in urine Neurological: No headache, dizziness or syncope Musculoskeletal: Positive for muscle pain and cramps Hematologic: No bruising Psychiatric: No history of depression or anxiety   Physical Exam:  Vital signs: BP 130/72   Pulse 74   Ht 5\' 2"  (1.575 m)   Wt 272 lb (123.4 kg)   BMI 49.75 kg/m   Constitutional:   Pleasant obese Caucasian female appears to be in NAD, Well developed, Well nourished, alert and cooperative Head:  Normocephalic and atraumatic. Eyes:   PEERL, EOMI. No icterus. Conjunctiva pink. Ears:   Normal auditory acuity. Neck:  Supple Throat: Oral cavity and pharynx without inflammation, swelling or lesion.  Respiratory: Respirations even and unlabored. Lungs clear to auscultation bilaterally.   No wheezes, crackles, or rhonchi.  Cardiovascular: Normal S1, S2. No MRG. Regular rate and rhythm. No peripheral edema, cyanosis or pallor.  Gastrointestinal:  Soft, nondistended, mild ttp posterior to drain opening; drain in LLQ with small amt of brown mucopurulent liquid No rebound or guarding. Normal bowel sounds. No appreciable masses or hepatomegaly. Rectal:  Not performed.  Msk:  Symmetrical without gross deformities. Without edema, no deformity or joint abnormality.  Neurologic:  Alert and  oriented x4;  grossly normal neurologically.  Skin:   Dry and intact without significant lesions or rashes. Psychiatric: Demonstrates good judgement and reason without abnormal affect or behaviors.  RELEVANT LABS AND IMAGING: CBC    Component Value Date/Time   WBC 12.9 (H) 11/18/2016 1207   RBC 3.66 (L) 11/18/2016 1207   HGB 10.3 (L) 11/18/2016 1207   HCT 33.3 (L) 11/18/2016 1207   PLT 265 11/18/2016 1207   MCV 91.0 11/18/2016 1207   MCH 28.1 11/18/2016 1207   MCHC 30.9 11/18/2016 1207   RDW 15.5 11/18/2016 1207   LYMPHSABS 889 04/15/2016 1528   MONOABS 889 04/15/2016 1528   EOSABS 0 (L) 04/15/2016 1528   BASOSABS 0 04/15/2016 1528    CMP     Component Value Date/Time   NA 135 11/18/2016 1207   K 3.9 11/18/2016 1207   CL 101 11/18/2016 1207   CO2 25 11/18/2016 1207   GLUCOSE 98 11/18/2016 1207   BUN 21 (H) 11/18/2016 1207   CREATININE 1.33 (H) 11/18/2016 1207   CREATININE 1.16 (H) 04/15/2016 1528   CALCIUM 8.5 (L) 11/18/2016 1207   PROT 6.5 10/12/2016 0449   ALBUMIN 3.1 (L) 10/12/2016 0449  AST 30 10/12/2016 0449   ALT 26 10/12/2016 0449   ALKPHOS 54 10/12/2016 0449   BILITOT 0.7 10/12/2016 0449   GFRNONAA 39 (L) 11/18/2016 1207   GFRNONAA 48 (L) 04/15/2016 1528   GFRAA 46  (L) 11/18/2016 1207   GFRAA 56 (L) 04/15/2016 1528   CLINICAL DATA:  71 year old female with history of colovesical fistula. Patient has a percutaneous drain. Recent drain injection demonstrated a persistent colonic fistula. Patient presents with a recent fever, feeling bad and purulent drainage around the catheter.  EXAM: CT PELVIS WITHOUT CONTRAST 12/16/16  TECHNIQUE: Multidetector CT imaging of the pelvis was performed following the standard protocol without intravenous contrast.  COMPARISON:  12/08/2016  FINDINGS: Urinary Tract: Again noted is gas within the urinary bladder. This was not present on the most recent comparison CT but has been present on prior imaging. Urinary bladder contains a small amount of fluid. No hydronephrosis. Visualized kidneys are unremarkable.  Bowel: Again noted are colonic diverticula particularly in the descending colon and proximal sigmoid colon. Mild inflammatory changes near the proximal sigmoid colon.  Vascular/Lymphatic: Atherosclerotic calcifications. Small lymph nodes in the inguinal region. Small lymph nodes in the retroperitoneum.  Reproductive:  Uterus has been removed.  No adnexal mass.  Other: Percutaneous drain in the left anterior pelvis. Drainage catheter is positioned between the bladder dome and the sigmoid colon. There is increased edema and inflammatory changes in this area compared to the recent comparison examination. There is also increased gas around the drain compared to the recent comparison examination. Difficult to exclude a small abscess collections due to the lack of intravenous contrast. Again noted is a large ventral hernia containing fat. Incidental imaging of the liver is unremarkable.  Musculoskeletal: Multilevel degenerative changes in the visualized spine. Stable round sclerotic density at L4 is suggestive for a bone island. Persistent thickening of the inferior left rectus  muscle.  IMPRESSION: Increased inflammatory changes around the percutaneous drain which is adjacent sigmoid colon and urinary bladder. Difficult to exclude a small recurrent abscess collection in this area. There continues to be evidence for a colovesical fistula.   Electronically Signed   By: Markus Daft M.D.   On: 12/16/2016 16:56  Assessment: 1. Colo-vesicular Fistula: identified in October 2018, s/p two different drain placements, one currently in place, continues with abscess irregardless of abx, requires surgery; surgery requesting colonoscopy prior to procedure 2. Screening colorectal cancer: Patient has never had a colonoscopy  Plan: 1. Discussed case with Dr. Havery Moros. Scheduled patient for screening colonoscopy in the [place]. Discussed risks, benefits, limitations and alternatives and the patient agrees to proceed. 2. Continue with recommendations from surgery and IR regarding abx usage and drain care 3. Patient to follow in clinic per recommendations from Dr. Havery Moros after time of procedure  Ellouise Newer, PA-C [facility] 12/31/2016, 3:32 PM  Cc: Leighton Ruff, MD

## 2017-01-05 NOTE — Addendum Note (Signed)
Addended by: Jeoffrey Massed on: 01/05/2017 01:44 PM   Modules accepted: Orders

## 2017-01-05 NOTE — Progress Notes (Signed)
Can you order repeat CBC if the patient has not had one anywhere else? Thanks-JLL

## 2017-01-07 ENCOUNTER — Other Ambulatory Visit: Payer: Medicare Other

## 2017-01-10 ENCOUNTER — Other Ambulatory Visit: Payer: Self-pay | Admitting: Internal Medicine

## 2017-01-12 ENCOUNTER — Telehealth: Payer: Self-pay

## 2017-01-12 ENCOUNTER — Other Ambulatory Visit (INDEPENDENT_AMBULATORY_CARE_PROVIDER_SITE_OTHER): Payer: Medicare Other

## 2017-01-12 DIAGNOSIS — N321 Vesicointestinal fistula: Secondary | ICD-10-CM | POA: Diagnosis not present

## 2017-01-12 DIAGNOSIS — Z1211 Encounter for screening for malignant neoplasm of colon: Secondary | ICD-10-CM

## 2017-01-12 LAB — CBC WITH DIFFERENTIAL/PLATELET
Basophils Absolute: 0.1 10*3/uL (ref 0.0–0.1)
Basophils Relative: 0.5 % (ref 0.0–3.0)
EOS PCT: 0.7 % (ref 0.0–5.0)
Eosinophils Absolute: 0.1 10*3/uL (ref 0.0–0.7)
HCT: 35.3 % — ABNORMAL LOW (ref 36.0–46.0)
HEMOGLOBIN: 11.4 g/dL — AB (ref 12.0–15.0)
Lymphs Abs: 1.1 10*3/uL (ref 0.7–4.0)
MCHC: 32.3 g/dL (ref 30.0–36.0)
MCV: 85.9 fl (ref 78.0–100.0)
MONO ABS: 0.9 10*3/uL (ref 0.1–1.0)
MONOS PCT: 6.2 % (ref 3.0–12.0)
Neutro Abs: 12.4 10*3/uL — ABNORMAL HIGH (ref 1.4–7.7)
Neutrophils Relative %: 84.9 % — ABNORMAL HIGH (ref 43.0–77.0)
Platelets: 390 10*3/uL (ref 150.0–400.0)
RBC: 4.11 Mil/uL (ref 3.87–5.11)
RDW: 16 % — ABNORMAL HIGH (ref 11.5–15.5)
WBC: 14.6 10*3/uL — AB (ref 4.0–10.5)

## 2017-01-12 NOTE — Telephone Encounter (Signed)
Spoke to patient, she was planning on getting the lab work done today.

## 2017-01-12 NOTE — Telephone Encounter (Signed)
-----   Message from Yetta Flock, MD sent at 01/10/2017  7:45 AM EST ----- Regarding: follow up labs Almyra Free this patient never had a follow up CBC as recommended, even though it is ordered. Can you call her and see if she is willing to go to the lab for this? Thanks

## 2017-01-14 ENCOUNTER — Other Ambulatory Visit: Payer: Self-pay

## 2017-01-14 MED ORDER — METRONIDAZOLE 500 MG PO TABS: 500.0000 mg | ORAL_TABLET | Freq: Two times a day (BID) | ORAL | 0 refills | Status: DC

## 2017-01-21 ENCOUNTER — Encounter: Payer: Self-pay | Admitting: Gastroenterology

## 2017-01-21 ENCOUNTER — Ambulatory Visit (AMBULATORY_SURGERY_CENTER): Payer: Medicare Other | Admitting: Gastroenterology

## 2017-01-21 ENCOUNTER — Other Ambulatory Visit: Payer: Self-pay

## 2017-01-21 VITALS — BP 147/53 | HR 83 | Temp 98.0°F | Resp 13 | Ht 62.0 in | Wt 272.0 lb

## 2017-01-21 DIAGNOSIS — Z538 Procedure and treatment not carried out for other reasons: Secondary | ICD-10-CM | POA: Diagnosis not present

## 2017-01-21 DIAGNOSIS — R933 Abnormal findings on diagnostic imaging of other parts of digestive tract: Secondary | ICD-10-CM

## 2017-01-21 DIAGNOSIS — D124 Benign neoplasm of descending colon: Secondary | ICD-10-CM | POA: Diagnosis not present

## 2017-01-21 DIAGNOSIS — K5792 Diverticulitis of intestine, part unspecified, without perforation or abscess without bleeding: Secondary | ICD-10-CM

## 2017-01-21 DIAGNOSIS — K648 Other hemorrhoids: Secondary | ICD-10-CM

## 2017-01-21 DIAGNOSIS — R109 Unspecified abdominal pain: Secondary | ICD-10-CM | POA: Diagnosis not present

## 2017-01-21 DIAGNOSIS — K5140 Inflammatory polyps of colon without complications: Secondary | ICD-10-CM | POA: Diagnosis not present

## 2017-01-21 HISTORY — DX: Other hemorrhoids: K64.8

## 2017-01-21 HISTORY — PX: COLONOSCOPY W/ POLYPECTOMY: SHX1380

## 2017-01-21 MED ORDER — SODIUM CHLORIDE 0.9 % IV SOLN
500.0000 mL | INTRAVENOUS | Status: DC
Start: 2017-01-21 — End: 2017-03-26

## 2017-01-21 NOTE — Patient Instructions (Signed)
Impression/Recommendations:  Polyp handout given to patient. Diverticulosis handout given to patient. Hemorrhoid handout given to patient.  Resume previous diet. Continue present medications.  Repeat colonoscopy recommended for surveillance.  Date to be determined after pathology results reviewed.  No ibuprofen, naproxen, or other NSAID drugs for 2 weeks.   Tylenol only until 02/04/2017.  Okay to proceed with surgery for sigmoid resection with Dr. Marcello Moores.   YOU HAD AN ENDOSCOPIC PROCEDURE TODAY AT [facility]:   Refer to the procedure report that was given to you for any specific questions about what was found during the examination.  If the procedure report does not answer your questions, please call your gastroenterologist to clarify.  If you requested that your care partner not be given the details of your procedure findings, then the procedure report has been included in a sealed envelope for you to review at your convenience later.  YOU SHOULD EXPECT: Some feelings of bloating in the abdomen. Passage of more gas than usual.  Walking can help get rid of the air that was put into your GI tract during the procedure and reduce the bloating. If you had a lower endoscopy (such as a colonoscopy or flexible sigmoidoscopy) you may notice spotting of blood in your stool or on the toilet paper. If you underwent a bowel prep for your procedure, you may not have a normal bowel movement for a few days.  Please Note:  You might notice some irritation and congestion in your nose or some drainage.  This is from the oxygen used during your procedure.  There is no need for concern and it should clear up in a day or so.  SYMPTOMS TO REPORT IMMEDIATELY:   Following lower endoscopy (colonoscopy or flexible sigmoidoscopy):  Excessive amounts of blood in the stool  Significant tenderness or worsening of abdominal pains  Swelling of the abdomen that is new, acute  Fever of 100F or  higher  For urgent or emergent issues, a gastroenterologist can be reached at any hour by calling [phone].   DIET:  We do recommend a small meal at first, but then you may proceed to your regular diet.  Drink plenty of fluids but you should avoid alcoholic beverages for 24 hours.  ACTIVITY:  You should plan to take it easy for the rest of today and you should NOT DRIVE or use heavy machinery until tomorrow (because of the sedation medicines used during the test).    FOLLOW UP: Our staff will call the number listed on your records the next business day following your procedure to check on you and address any questions or concerns that you may have regarding the information given to you following your procedure. If we do not reach you, we will leave a message.  However, if you are feeling well and you are not experiencing any problems, there is no need to return our call.  We will assume that you have returned to your regular daily activities without incident.  If any biopsies were taken you will be contacted by phone or by letter within the next 1-3 weeks.  Please call us at [phone] if you have not heard about the biopsies in 3 weeks.    SIGNATURES/CONFIDENTIALITY: You and/or your care partner have signed paperwork which will be entered into your electronic medical record.  These signatures attest to the fact that that the information above on your After Visit Summary has been reviewed and is understood.  Full responsibility  of the confidentiality of this discharge information lies with you and/or your care-partner.

## 2017-01-21 NOTE — Op Note (Signed)
[facility] Patient Name: Kimberly [name] Procedure Date: 01/21/2017 8:30 AM MRN: [id number] Endoscopist: Remo Lipps P. Glendia Olshefski MD, MD Age: 72 Referring MD:  Date of Birth: Feb 15, 1945 Gender: Female Account #: [id number] Procedure:                Colonoscopy Indications:              Abnormal CT of the GI tract - history of suspected                            diverticulitis of sigmoid colon with associated                            abscess s/p drain placement, concern for fistual                            from colon to bladder. Patient on antibiotics with                            baseline LLQ discomfort. Colonoscopy requested                            prior to surgery to rule out malignancy Medicines:                Monitored Anesthesia Care Procedure:                Pre-Anesthesia Assessment:                           - Prior to the procedure, a History and Physical                            was performed, and patient medications and                            allergies were reviewed. The patient's tolerance of                            previous anesthesia was also reviewed. The risks                            and benefits of the procedure and the sedation                            options and risks were discussed with the patient.                            All questions were answered, and informed consent                            was obtained. Prior Anticoagulants: The patient has                            taken no previous anticoagulant or antiplatelet  agents. ASA Grade Assessment: [name] - A patient with                            severe systemic disease. After reviewing the risks                            and benefits, the patient was deemed in                            satisfactory condition to undergo the procedure.                           After obtaining informed consent, the colonoscope                            was passed  under direct vision. Throughout the                            procedure, the patient's blood pressure, pulse, and                            oxygen saturations were monitored continuously. The                            Model PCF-[id number]) scope was introduced                            through the anus with the intention of advancing to                            the cecum. The scope was advanced to the descending                            colon before the procedure was aborted. Medications                            were given. The colonoscopy was performed without                            difficulty. The patient tolerated the procedure                            well. The quality of the bowel preparation was                            fair. The rectum was photographed. Scope In: 8:45:09 AM Scope Out: 8:52:55 AM Total Procedure Duration: 0 hours 7 minutes 46 seconds  Findings:                 The perianal and digital rectal examinations were                            normal.  A 6 to 7 mm polyp was found in the descending                            colon. The polyp was pedunculated. The polyp was                            removed with a hot snare. Resection and retrieval                            were complete.                           Multiple medium-mouthed diverticula were found in                            the recto-sigmoid colon, sigmoid colon and                            descending colon, with associated edema in the                            sigmoid colon.                           Internal hemorrhoids were found during retroflexion.                           The bowel preparation was only fair. Due to this                            issue, and the patient's inability to lie on her                            side due to drain in LLQ, with associated                            positioning difficulty and recent diverticulitis, I                             elected not to complete the full colonoscopy. The                            extent reached was the descending colon. The exam                            was otherwise without abnormality. No obvious mass                            lesions noted. Water immersion and minimal air was                            used for this exam. Complications:            No immediate complications. Estimated blood loss:  Minimal. Estimated Blood Loss:     Estimated blood loss was minimal. Impression:               - Preparation of the colon was fair.                           - One 6 to 7 mm polyp in the descending colon,                            removed with a hot snare. Resected and retrieved.                           - Diverticulosis in the recto-sigmoid colon, in the                            sigmoid colon and in the descending colon, with                            edema in the recto-sigmoid colon / sigmoid colon.                            No obvious mass lesion noted.                           - Internal hemorrhoids.                           - Procedure aborted in descending colon due to                            issues as outlined above.                           - The examination was otherwise normal. Recommendation:           - Patient has a contact number available for                            emergencies. The signs and symptoms of potential                            delayed complications were discussed with the                            patient. Return to normal activities tomorrow.                            Written discharge instructions were provided to the                            patient.                           - Resume previous diet.                           -  Continue present medications.                           - Await pathology results.                           - Repeat colonoscopy is recommended for                             surveillance. The colonoscopy date will be                            determined after pathology results from today's                            exam become available for review. Complete                            colonoscopy can be done post-operatively following                            sigmoid resection.                           - No ibuprofen, naproxen, or other non-steroidal                            anti-inflammatory drugs for 2 weeks after polyp                            removal.                           - Okay to proceed with surgery for sigmoid                            resection with Dr. Shon Hough P. Deundra Furber MD, MD 01/21/2017 9:05:08 AM This report has been signed electronically.

## 2017-01-21 NOTE — Progress Notes (Signed)
To PACU, VSS. Report to [email or web address] 

## 2017-01-21 NOTE — Progress Notes (Signed)
Called to room to assist during endoscopic procedure.  Patient ID and intended procedure confirmed with present staff. Received instructions for my participation in the procedure from the performing physician.  

## 2017-01-24 ENCOUNTER — Telehealth: Payer: Self-pay

## 2017-01-24 NOTE — Telephone Encounter (Signed)
  Follow up Call-  Call back number 01/21/2017  Post procedure Call Back phone  # [phone]  Permission to leave phone message Yes  Some recent data might be hidden     Patient questions:  Do you have a fever, pain , or abdominal swelling? No. Pain Score  0 *  Have you tolerated food without any problems? Yes.    Have you been able to return to your normal activities? Yes.    Do you have any questions about your discharge instructions: Diet   No. Medications  No. Follow up visit  No.  Do you have questions or concerns about your Care? No.  Actions: * If pain score is 4 or above: No action needed, pain <4.  Did have some discomfort yesterday.  "moved all around" abdomen Pt assumed this was gas.  No pain today.

## 2017-01-26 NOTE — Progress Notes (Signed)
Office Visit Note  Patient: Kimberly [name]             Date of Birth: [birth year]           MRN: [id number]             PCP: [name], MD Referring: [name], MD Visit Date: 02/09/2017 Occupation: @GUAROCC @    Subjective:     History of Present Illness: Kimberly [name] is a 72 y.o. female with history of leukocytoclastic vasculitis and osteoarthritis. She states she was having frequent urinary tract infections. She was diagnosed with colovesicular fistula in August 2018. Since then she's been off the methotrexate since then. She is reduced her prednisone to 5 mg by mouth daily. She had a lot of side effects from the antibiotics.she did not have any recurrence of vasculitis. Her psoriasis has not flared.  Activities of Daily Living:  Patient reports morning stiffness for all day.   Patient Reports nocturnal pain.  Difficulty dressing/grooming: Denies Difficulty climbing stairs: Reports Difficulty getting out of chair: Reports Difficulty using hands for taps, buttons, cutlery, and/or writing: Denies   Review of Systems  Constitutional: Positive for weakness. Negative for fatigue.  HENT: Negative for mouth sores, mouth dryness and nose dryness.   Eyes: Positive for dryness. Negative for pain and visual disturbance.  Respiratory: Negative for cough, hemoptysis, shortness of breath and difficulty breathing.   Cardiovascular: Negative for chest pain, palpitations, hypertension, irregular heartbeat and swelling in legs/feet.  Gastrointestinal: Negative for blood in stool, constipation and diarrhea.  Endocrine: Negative for increased urination.  Genitourinary: Negative for painful urination.  Musculoskeletal: Positive for arthralgias, joint pain, muscle weakness and morning stiffness. Negative for joint swelling, myalgias, muscle tenderness and myalgias.  Skin: Positive for rash (From recent antiobiotics ). Negative for color change, pallor, hair loss, nodules/bumps,  redness, skin tightness, ulcers and sensitivity to sunlight.  Neurological: Negative for dizziness, numbness and headaches.  Hematological: Negative for swollen glands.  Psychiatric/Behavioral: Positive for sleep disturbance (Trazodone). Negative for depressed mood. The patient is not nervous/anxious.     PMFS History:  Patient Active Problem List   Diagnosis Date Noted  . Skin erythema 11/01/2016  . Sleep difficulties 10/31/2016  . Pelvic abscess in female 10/11/2016  . Bilateral leg edema 08/20/2016  . Recurrent UTI 08/15/2016  . Psoriasis 04/13/2016  . History of diverticulitis 04/13/2016  . Osteopenia of multiple sites 04/13/2016  . DJD (degenerative joint disease), cervical 04/07/2016  . Primary osteoarthritis of both knees 04/07/2016  . Decreased abduction of right shoulder joint 04/07/2016  . Atrial tachycardia ([place]) 05/12/2015  . NSVT (nonsustained ventricular tachycardia) ([place]) 05/12/2015  . Abnormal nuclear stress test - INTERMEDIATE RISK 04/14/2015  . Coronary artery calcification seen on CAT scan 02/28/2015  . Left lower quadrant pain 02/17/2015  . Hyperglycemia 08/09/2014  . Dyspnea 03/20/2013  . Hypokalemia 12/31/2012  . Umbilical hernia 96/78/9381  . Pleural effusion 05/10/2012  . Myasthenia gravis without exacerbation ([place]) 02/12/2010  . DIPLOPIA 11/21/2009  . Vitamin D deficiency 06/25/2008  . Hyperlipidemia 06/25/2008  . Essential hypertension 06/25/2008  . Leukocytoclastic vasculitis ([place]) 06/19/2007  . MUSCLE WEAKNESS (GENERALIZED) 02/10/2007  . Unspecified Myalgia and Myositis 01/24/2007  . EDEMA- LOCALIZED 01/24/2007  . ANA POSITIVE, HX OF 01/24/2007    Past Medical History:  Diagnosis Date  . Allergy   . Arthritis   . Atrial tachycardia ([place]) 05/12/2015  . Cataract   . Diplopia   . Family history of ischemic  heart disease   . GERD (gastroesophageal reflux disease)   . Heart murmur   . Hypertension   . Myalgia and myositis, unspecified   .  Myasthenia gravis without exacerbation (HCC)    [facility], Dr Anna Genre  . NSVT (nonsustained ventricular tachycardia) ([place]) 05/12/2015  . Other and unspecified hyperlipidemia   . Psoriasis    plantar ;Dr Jarome Matin  . Unspecified vitamin D deficiency    Dr Estanislado Pandy  . Vasculitis ([place])    Dr Estanislado Pandy    Family History  Problem Relation Age of Onset  . Heart attack Mother 12  . Heart failure Mother   . Stroke Paternal Grandmother        in 43s  . Lung cancer Father   . Cancer Father        lung  . Hypertension Sister   . Hyperthyroidism Sister        Graves  . Hyperthyroidism Sister        Graves  . Diabetes Neg Hx   . Colon cancer Neg Hx   . Esophageal cancer Neg Hx   . Stomach cancer Neg Hx   . Rectal cancer Neg Hx    Past Surgical History:  Procedure Laterality Date  . APPENDECTOMY     & exploratory for inflammation; Dr Hassell Done  . CARDIAC CATHETERIZATION N/A 04/17/2015   Procedure: Left Heart Cath and Coronary Angiography;  Surgeon: Leonie Man, MD;  Location: [facility];  Service: Cardiovascular;  Laterality: N/A;  . CERVICAL FUSION     C5-6; Dr Vertell Limber  . CHOLECYSTECTOMY     for stones  . IR RADIOLOGIST EVAL & MGMT  11/16/2016  . IR RADIOLOGIST EVAL & MGMT  12/08/2016  . IR RADIOLOGIST EVAL & MGMT  12/16/2016  . LAMINECTOMY  2010   T1-2; Dr Vertell Limber  . SHOULDER SURGERY     Left & Right  . THORACENTESIS     for peripneumonic effusion  . TOTAL ABDOMINAL HYSTERECTOMY W/ BILATERAL SALPINGOOPHORECTOMY     For Fibroids & endometriosis   Social History   Social History Narrative   Lives with husband in a one story home.  Had one child that only lived for 14 hours.     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          Epworth Sleepiness Scale = 2 (as of 03/14/2015)     Objective: Vital Signs: BP 106/67 (BP Location: Right Arm, Patient Position: Sitting, Cuff Size: Normal)   Pulse 72   Resp 18   Ht 5\' 2"  (1.575 m)   Wt 275  lb (124.7 kg)   BMI 50.30 kg/m    Physical Exam  Constitutional: She is oriented to person, place, and time. She appears well-developed and well-nourished.  HENT:  Head: Normocephalic and atraumatic.  Eyes: Conjunctivae and EOM are normal.  Neck: Normal range of motion.  Cardiovascular: Normal rate, regular rhythm, normal heart sounds and intact distal pulses.  Pitting edema over bilateral lower extremities  Pulmonary/Chest: Effort normal and breath sounds normal.  Abdominal: Soft. Bowel sounds are normal.  Lymphadenopathy:    She has no cervical adenopathy.  Neurological: She is alert and oriented to person, place, and time.  Skin: Skin is warm and dry. Capillary refill takes less than 2 seconds.  Psychiatric: She has a normal mood and affect. Her behavior is normal.  Nursing note and vitals reviewed.    Musculoskeletal Exam: C-spine and thoracic  lumbar spine limited range of motion. Shoulder joints elbow joints wrist joints with good range of motion. No synovitis was noted. Hip joints were difficult to assess she was sitting in the chair and had limited mobility. Knee joints are good range of motion. She has significant pitting edema over bilateral lower extremities.  CDAI Exam: No CDAI exam completed.    Investigation: No additional findings. CBC Latest Ref Rng & Units 01/12/2017 11/18/2016 10/12/2016  WBC 4.0 - 10.5 K/uL 14.6(H) 12.9(H) 10.4  Hemoglobin 12.0 - 15.0 g/dL 11.4(L) 10.3(L) 9.8(L)  Hematocrit 36.0 - 46.0 % 35.3(L) 33.3(L) 31.3(L)  Platelets 150.0 - 400.0 K/uL 390.0 265 351   CMP Latest Ref Rng & Units 11/18/2016 10/12/2016 08/20/2016  Glucose 65 - 99 mg/dL 98 94 98  BUN 6 - 20 mg/dL 21(H) 17 22  Creatinine 0.44 - 1.00 mg/dL 1.33(H) 0.94 1.00  Sodium 135 - 145 mmol/L 135 138 139  Potassium 3.5 - 5.1 mmol/L 3.9 3.8 3.8  Chloride 101 - 111 mmol/L 101 102 101  CO2 22 - 32 mmol/L 25 27 31   Calcium 8.9 - 10.3 mg/dL 8.5(L) 8.8(L) 9.8  Total Protein 6.5 - 8.1 g/dL - 6.5  7.6  Total Bilirubin 0.3 - 1.2 mg/dL - 0.7 0.4  Alkaline Phos 38 - 126 U/L - 54 64  AST 15 - 41 U/L - 30 16  ALT 14 - 54 U/L - 26 15    Imaging: No results found.  Speciality Comments: No specialty comments available.    Procedures:  No procedures performed Allergies: Azithromycin; Ibuprofen; Levaquin [levofloxacin in d5w]; Tetracycline; Isovue [iopamidol]; Sulfonamide derivatives; Aleve [naproxen]; Zosyn [piperacillin sod-tazobactam so]; Amlodipine besy-benazepril hcl; and Amoxicillin   Assessment / Plan:     Visit Diagnoses: Leukocytoclastic vasculitis ([place]) - biopsy-proven.  She had lower extremity involvement.she does not have any rash on examination today. She had to come off methotrexate due to coloesicular fistula.she has a draining currently. She is on low-dose prednisone. I've advised her to contact us in case she gets a new rash.  High risk medication use - prednisone 5mg  po qd(off methotrexate since August due to colovesicular fistula)  DDD (degenerative disc disease), cervical: She has limited range of motion.  Primary osteoarthritis of both knees: Chronic discomfort.  H/O repair of rotator cuff - Bilateral limited abduction  Pedal edema: She's significant pedal edema involving bilateral lower extremities. Compression socks were discussed. I've also advised to follow up with her PCP.  Psoriasis - Followed up by Dr. Jarome Matin: According to patient the psoriasis much better now.she's not having of psoriasis flare currently.  Osteopenia of multiple sites - T score -1.1 2013. Patient has not had a bone density and she's been on prednisone. She states she will get bone density through her PCP.she will need a repeat bone density. She would prefer to wait until her surgery is done for the fistula.  Myasthenia gravis without exacerbation ([place]): Followed by neurology.  Other medical problems are listed as follows:  History of diverticulitis  History of vitamin D  deficiency  History of hyperlipidemia  History of hypertension   Orders: No orders of the defined types were placed in this encounter.  No orders of the defined types were placed in this encounter.   Face-to-face time spent with patient was 30 minutes. Greater than50% of time was spent in counseling and coordination of care.  Follow-Up Instructions: Return in about 6 months (around 08/09/2017).   Bo Merino, MD  Note - This record  has been created using [employer].  Chart creation errors have been sought, but may not always  have been located. Such creation errors do not reflect on  the standard of medical care.

## 2017-01-27 ENCOUNTER — Encounter: Payer: Self-pay | Admitting: Gastroenterology

## 2017-01-31 ENCOUNTER — Ambulatory Visit: Payer: Self-pay | Admitting: General Surgery

## 2017-01-31 DIAGNOSIS — N321 Vesicointestinal fistula: Secondary | ICD-10-CM | POA: Diagnosis not present

## 2017-01-31 NOTE — H&P (Addendum)
72 year old female with a history of diverticulitis in 2017 who presents to the office for evaluation of recurrent urinary tract infections.  She was seen by urologist and CT scan was ordered.  This showed concern for colovesicular fistula and a 3.7 cm abscess.  She was tried on Augmentin but developed a reaction to this and had to come off.  I saw her in the office and recommended drain placement as an inpatient.  She underwent this without much difficulty.  She had trouble with irrigating her drain causing a pressure/pain sensation, but this has resolved.  She continues to have cloudy urine.  Her appetite is slightly better But still decreased.  She was seen at [facility] and a colonoscopy was performed.  Her stricture was unable to be traversed with the colonoscope.  she has a drain in her left lower quadrant abscess placed by IR.  She has been diagnosed with myasthenia gravis since 2011 and takes methotrexate for this.  She has coronary artery disease but has had a negative catheter recently.  She has paroxysmal atrial fibrillation but is not anticoagulated currently.  She has a history of an open appendectomy, open hysterectomy and open cholecystectomy.  She also has a known umbilical hernia containing fat only.    Past Medical History:  Diagnosis Date  . Allergy   . Arthritis   . Atrial tachycardia ([place]) 05/12/2015  . Cataract   . Diplopia   . Family history of ischemic heart disease   . GERD (gastroesophageal reflux disease)   . Heart murmur   . Hypertension   . Myalgia and myositis, unspecified   . Myasthenia gravis without exacerbation (HCC)    [facility], Dr Anna Genre  . NSVT (nonsustained ventricular tachycardia) ([place]) 05/12/2015  . Other and unspecified hyperlipidemia   . Psoriasis    plantar ;Dr Jarome Matin  . Unspecified vitamin D deficiency    Dr Estanislado Pandy  . Vasculitis ([place])    Dr Estanislado Pandy   Past Surgical History:  Procedure Laterality Date  . APPENDECTOMY     & exploratory for  inflammation; Dr Hassell Done  . CARDIAC CATHETERIZATION N/A 04/17/2015   Procedure: Left Heart Cath and Coronary Angiography;  Surgeon: Leonie Man, MD;  Location: [facility];  Service: Cardiovascular;  Laterality: N/A;  . CERVICAL FUSION     C5-6; Dr Vertell Limber  . CHOLECYSTECTOMY     for stones  . IR RADIOLOGIST EVAL & MGMT  11/16/2016  . IR RADIOLOGIST EVAL & MGMT  12/08/2016  . IR RADIOLOGIST EVAL & MGMT  12/16/2016  . LAMINECTOMY  2010   T1-2; Dr Vertell Limber  . SHOULDER SURGERY     Left & Right  . THORACENTESIS     for peripneumonic effusion  . TOTAL ABDOMINAL HYSTERECTOMY W/ BILATERAL SALPINGOOPHORECTOMY     For Fibroids & endometriosis   Allergies  Allergen Reactions  . Azithromycin Other (See Comments)    Angioedema  . Ibuprofen     800 mg - edema all over, including lips and tongue  . Levaquin [Levofloxacin In D5w] Other (See Comments)    D/T MYASTHENIA GRAVIS  . Tetracycline Rash  . Isovue [Iopamidol] Dermatitis    Pt had a rash and blisters on bilat feet and lower part of legs.  Full premeds in the future.  This happened twice after IV contrast.   J Bohm  RTRCT  . Sulfonamide Derivatives     Seizure age 44 (Note: probably fever induced)  . Aleve [Naproxen] Other (See  Comments)    Caused excessive salivation  . Zosyn [Piperacillin Sod-Tazobactam So]     Severe leg rash, skin peeling  . Amlodipine Besy-Benazepril Hcl Cough  . Amoxicillin Swelling and Rash    Has patient had a PCN reaction causing immediate rash, facial/tongue/throat swelling, SOB or lightheadedness with hypotension: No Has patient had a PCN reaction causing severe rash involving mucus membranes or skin necrosis: No Has patient had a PCN reaction that required hospitalization: No Has patient had a PCN reaction occurring within the last 10 years: Yes If all of the above answers are "NO", then may proceed with Cephalosporin use.    Outpatient Encounter Medications as of 01/31/2017  Medication Sig  .  acetaminophen (TYLENOL) 325 MG tablet Take 2 tablets (650 mg total) by mouth every 6 (six) hours as needed for mild pain (or temp > 100).  Marland Kitchen aspirin EC 81 MG tablet Take 81 mg by mouth at bedtime.  Marland Kitchen augmented betamethasone dipropionate (DIPROLENE-AF) 0.05 % ointment Apply 1 application topically daily. For vasculitis and psoriasis  . Calcium Carb-Cholecalciferol (CALCIUM 600-D PO) Take 600 mg by mouth at bedtime.  . cholecalciferol (VITAMIN D) 1000 units tablet Take 1,000 Units by mouth at bedtime.  . Cyanocobalamin (VITAMIN B-12 PO) Take 1 tablet by mouth at bedtime.  . folic acid (FOLVITE) 1 MG tablet TAKE 2 TABLETS BY MOUTH ONCE DAILY  . furosemide (LASIX) 40 MG tablet Take 1 tablet (40 mg total) by mouth daily.  Marland Kitchen LORazepam (ATIVAN) 0.5 MG tablet Take 1 tablet (0.5 mg total) by mouth at bedtime. (Patient taking differently: Take 0.5 mg by mouth as needed. )  . losartan (COZAAR) 100 MG tablet Take 1 tablet (100 mg total) by mouth daily.  . metroNIDAZOLE (FLAGYL) 500 MG tablet Take 1 tablet (500 mg total) by mouth 2 (two) times daily.  . Multiple Vitamin (MULTIVITAMIN WITH MINERALS) TABS tablet Take 1 tablet by mouth at bedtime.  . predniSONE (DELTASONE) 1 MG tablet TAKE 2 TABLETS (2 MG TOTAL) BY MOUTH DAILY WITH BREAKFAST. TAKE WITH A 5 MG TABLET FOR A 7 MG DOSE  . predniSONE (DELTASONE) 5 MG tablet TAKE 1 TABLET BY MOUTH EVERY DAY WITH BREAKFAST AND WITH 2 PREDNISONE 1MG  TABLETS  . pyridostigmine (MESTINON) 60 MG tablet Take 1 tablet (60 mg total) by mouth 3 (three) times daily.  Marland Kitchen pyridOXINE (VITAMIN B-6) 100 MG tablet Take 100 mg by mouth at bedtime.  . sodium chloride flush (NS) 0.9 % SOLN Place 10 mLs into feeding tube daily. Into percutaneous/abdominal tube daily.  Marland Kitchen spironolactone (ALDACTONE) 25 MG tablet TAKE 1 TABLET BY MOUTH EVERY DAY  . traMADol (ULTRAM) 50 MG tablet Take 1 tablet (50 mg total) by mouth every 12 (twelve) hours as needed (mild pain). (Patient not taking: Reported on  01/21/2017)  . traZODone (DESYREL) 50 MG tablet TAKE 1 TO 2 TABLETS BY MOUTH EVERY DAY AT BEDTIME AS NEEDED FOR SLEEP  . valsartan (DIOVAN) 320 MG tablet Take 320 mg by mouth daily.   Facility-Administered Encounter Medications as of 01/31/2017  Medication  . 0.9 %  sodium chloride infusion   Social History   Socioeconomic History  . Marital status: Married    Spouse name: Not on file  . Number of children: Not on file  . Years of education: Not on file  . Highest education level: Not on file  Social Needs  . Financial resource strain: Not on file  . Food insecurity - worry: Not on file  .  Food insecurity - inability: Not on file  . Transportation needs - medical: Not on file  . Transportation needs - non-medical: Not on file  Occupational History  . 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 Smoking status: Never Smoker  . Smokeless tobacco: Never Used  Substance and Sexual Activity  . Alcohol use: No    Alcohol/week: 0.0 oz  . Drug use: No  . Sexual activity: Not Currently  Other Topics Concern  . Not on file  Social History Narrative   Lives with husband in a one story home.  Had one child that only lived for 14 hours.     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          Epworth Sleepiness Scale = 2 (as of 03/14/2015)   Family History  Problem Relation Age of Onset  . Heart attack Mother 90  . Heart failure Mother   . Stroke Paternal Grandmother        in 51s  . Lung cancer Father   . Cancer Father        lung  . Hypertension Sister   . Hyperthyroidism Sister        Graves  . Hyperthyroidism Sister        Graves  . Diabetes Neg Hx   . Colon cancer Neg Hx   . Esophageal cancer Neg Hx   . Stomach cancer Neg Hx   . Rectal cancer Neg Hx    Review of Systems - General ROS: negative for - chills or fever Allergy and Immunology ROS: negative for - hives Breast ROS: negative for breast  lumps Respiratory ROS: no cough, shortness of breath, or wheezing Cardiovascular ROS: no chest pain or dyspnea on exertion Gastrointestinal ROS: positive for - abdominal pain negative for - blood in stools Genito-Urinary ROS: no dysuria, trouble voiding, or hematuria   Vitals Claiborne Billings Dockery LPN; 05/14/5463 6:81 PM) 01/31/2017 2:35 PM Weight: 276.4 lb Height: 61in Body Surface Area: 2.17 m Body Mass Index: 52.22 kg/m  Temp.: 98.67F(Oral)  Pulse: 93 (Regular)  BP: 122/80 (Sitting, Right Wrist, Standard)    Physical Exam   General Mental Status-Alert. General Appearance-Not in acute distress. Build & Nutrition-Well nourished. Posture-Normal posture. Gait-Normal.  Head and Neck Head-normocephalic, atraumatic with no lesions or palpable masses. Trachea-midline.  Chest and Lung Exam Chest and lung exam reveals -on auscultation, normal breath sounds, no adventitious sounds and normal vocal resonance.  Cardiovascular Cardiovascular examination reveals -normal heart sounds, regular rate and rhythm with no murmurs and no digital clubbing, cyanosis, edema, increased warmth or tenderness.  Abdomen Inspection Inspection of the abdomen reveals - No Hernias. Palpation/Percussion Palpation and Percussion of the abdomen reveal - Soft, Non Tender, No Rigidity (guarding), No hepatosplenomegaly and No Palpable abdominal masses.  Neurologic Neurologic evaluation reveals -alert and oriented x 3 with no impairment of recent or remote memory, normal attention span and ability to concentrate, normal sensation and normal coordination.  Musculoskeletal Normal Exam - Bilateral-Upper Extremity Strength Normal and Lower Extremity Strength Normal.  Assessment  Colovesical fistula  Plan  72 year old female with myasthenia gravis who has a known colovesicular fistula from diverticular disease.  She has a drain within the abscess cavity.  She has had multiple  abdominal surgeries over many years.  She has a known umbilical hernia.  She takes 7 milligrams of prednisone daily.  She has underwent a colonoscopy.  She was noted  to have a sigmoid stricture.  This was unable to be traversed with the colonoscope.  Her fistula has failed to resolve with any medical management and therefore I recommend resection.  Given her myasthenia gravis gravis, we will plan on doing an open operation as this will limit her time under anesthesia.  Even with this, I expect her surgery to take 4-6 hours in total.  We will plan on repairing her umbilical hernia at the same time.  I will have our office touch base with her neurologist to confirm that she is ready for surgery.  We will also have her see the anesthesiologist preoperatively due to her myasthenia gravis gravis.  The surgery and anatomy were described to the patient as well as the risks of surgery and the possible complications.  These include: Bleeding, deep abdominal infections and possible wound complications such as hernia and infection, damage to adjacent structures, leak of surgical connections, which can lead to other surgeries and possibly an ostomy, possible need for other procedures, such as abscess drains in radiology, possible prolonged hospital stay, possible diarrhea from removal of part of the colon, possible constipation from narcotics, possible bowel, bladder or sexual dysfunction if having rectal surgery, prolonged fatigue/weakness or appetite loss, possible early recurrence of of disease, possible complications of their medical problems such as heart disease or arrhythmias or lung problems, death (less than 1%). I believe the patient understands and wishes to proceed with the surgery.   Neurology clearance:  To Whom It May Concern:  Ms. Margi Edmundson (DOB [birth year]) is a patient of mine with myasthenia gravis.  She is cleared to undergo open partial colectomy and umbilical hernia repair with the following  recommendations:  avoiding neuromuscular blocking agents, unless absolutely necessary.  If neuromuscular blocking agents are administered, the degree of neuromuscular blockade should be monitored using a quantitative train-of-four nerve stimulator.  If you have any further questions, please contact my office.  Sincerely,    Donika K. Posey Pronto

## 2017-02-09 ENCOUNTER — Encounter: Payer: Self-pay | Admitting: Neurology

## 2017-02-09 ENCOUNTER — Encounter: Payer: Self-pay | Admitting: Rheumatology

## 2017-02-09 ENCOUNTER — Ambulatory Visit (INDEPENDENT_AMBULATORY_CARE_PROVIDER_SITE_OTHER): Payer: Medicare Other | Admitting: Rheumatology

## 2017-02-09 VITALS — BP 106/67 | HR 72 | Resp 18 | Ht 62.0 in | Wt 275.0 lb

## 2017-02-09 DIAGNOSIS — Z8719 Personal history of other diseases of the digestive system: Secondary | ICD-10-CM

## 2017-02-09 DIAGNOSIS — L409 Psoriasis, unspecified: Secondary | ICD-10-CM | POA: Diagnosis not present

## 2017-02-09 DIAGNOSIS — M310 Hypersensitivity angiitis: Secondary | ICD-10-CM

## 2017-02-09 DIAGNOSIS — Z79899 Other long term (current) drug therapy: Secondary | ICD-10-CM | POA: Diagnosis not present

## 2017-02-09 DIAGNOSIS — M170 Bilateral primary osteoarthritis of knee: Secondary | ICD-10-CM | POA: Diagnosis not present

## 2017-02-09 DIAGNOSIS — Z8639 Personal history of other endocrine, nutritional and metabolic disease: Secondary | ICD-10-CM | POA: Diagnosis not present

## 2017-02-09 DIAGNOSIS — G7000 Myasthenia gravis without (acute) exacerbation: Secondary | ICD-10-CM

## 2017-02-09 DIAGNOSIS — R600 Localized edema: Secondary | ICD-10-CM | POA: Diagnosis not present

## 2017-02-09 DIAGNOSIS — Z9889 Other specified postprocedural states: Secondary | ICD-10-CM

## 2017-02-09 DIAGNOSIS — R350 Frequency of micturition: Secondary | ICD-10-CM

## 2017-02-09 DIAGNOSIS — M5030 Other cervical disc degeneration, unspecified cervical region: Secondary | ICD-10-CM

## 2017-02-09 DIAGNOSIS — M8589 Other specified disorders of bone density and structure, multiple sites: Secondary | ICD-10-CM

## 2017-02-09 DIAGNOSIS — Z8679 Personal history of other diseases of the circulatory system: Secondary | ICD-10-CM | POA: Diagnosis not present

## 2017-02-11 ENCOUNTER — Other Ambulatory Visit: Payer: Self-pay | Admitting: Urology

## 2017-02-13 ENCOUNTER — Other Ambulatory Visit: Payer: Self-pay | Admitting: Internal Medicine

## 2017-03-10 ENCOUNTER — Encounter (HOSPITAL_COMMUNITY): Payer: Self-pay

## 2017-03-10 NOTE — Pre-Procedure Instructions (Signed)
The following are in epic: Last office note Dr. Estanislado Pandy 02/09/2017 EKG 04/19/2016

## 2017-03-11 ENCOUNTER — Encounter (HOSPITAL_COMMUNITY)
Admission: RE | Admit: 2017-03-11 | Discharge: 2017-03-11 | Disposition: A | Discharge status: Home / Self Care | Payer: Medicare Other | Source: Ambulatory Visit | Attending: General Surgery | Admitting: General Surgery

## 2017-03-11 ENCOUNTER — Encounter (HOSPITAL_COMMUNITY): Payer: Self-pay

## 2017-03-11 ENCOUNTER — Other Ambulatory Visit: Payer: Self-pay

## 2017-03-11 DIAGNOSIS — Z01818 Encounter for other preprocedural examination: Secondary | ICD-10-CM | POA: Insufficient documentation

## 2017-03-11 DIAGNOSIS — K632 Fistula of intestine: Secondary | ICD-10-CM | POA: Diagnosis not present

## 2017-03-11 HISTORY — DX: Diverticulosis of intestine, part unspecified, without perforation or abscess without bleeding: K57.90

## 2017-03-11 HISTORY — DX: Vitamin D deficiency, unspecified: E55.9

## 2017-03-11 HISTORY — DX: Hypersensitivity angiitis: M31.0

## 2017-03-11 HISTORY — DX: Unspecified osteoarthritis, unspecified site: M19.90

## 2017-03-11 HISTORY — DX: Diverticulitis of intestine, part unspecified, without perforation or abscess without bleeding: K57.92

## 2017-03-11 HISTORY — DX: Pneumonia, unspecified organism: J18.9

## 2017-03-11 HISTORY — DX: Urinary tract infection, site not specified: N39.0

## 2017-03-11 HISTORY — DX: Personal history of other diseases of the digestive system: Z87.19

## 2017-03-11 HISTORY — DX: Umbilical hernia without obstruction or gangrene: K42.9

## 2017-03-11 HISTORY — DX: Sleep disorder, unspecified: G47.9

## 2017-03-11 HISTORY — DX: Spondylosis without myelopathy or radiculopathy, cervical region: M47.812

## 2017-03-11 HISTORY — DX: Other hemorrhoids: K64.8

## 2017-03-11 HISTORY — DX: Other specified disorders of bone density and structure, unspecified site: M85.80

## 2017-03-11 HISTORY — DX: Vesicointestinal fistula: N32.1

## 2017-03-11 HISTORY — DX: Atherosclerosis of aorta: I70.0

## 2017-03-11 LAB — CBC
HCT: 34.5 % — ABNORMAL LOW (ref 36.0–46.0)
Hemoglobin: 10.6 g/dL — ABNORMAL LOW (ref 12.0–15.0)
MCH: 27.4 pg (ref 26.0–34.0)
MCHC: 30.7 g/dL (ref 30.0–36.0)
MCV: 89.1 fL (ref 78.0–100.0)
PLATELETS: 374 10*3/uL (ref 150–400)
RBC: 3.87 MIL/uL (ref 3.87–5.11)
RDW: 15.1 % (ref 11.5–15.5)
WBC: 12.4 10*3/uL — ABNORMAL HIGH (ref 4.0–10.5)

## 2017-03-11 NOTE — Pre-Procedure Instructions (Signed)
Anesthesia Consult via telephone with Dr. Ermalene Postin.

## 2017-03-11 NOTE — Pre-Procedure Instructions (Signed)
CBC results 03/11/2017 faxed to Dr. Marcello Moores and Dr. Gloriann Loan via epic.

## 2017-03-11 NOTE — Patient Instructions (Addendum)
Your procedure is scheduled on: Friday, March 18, 2017   Surgery Time: 7:30AM-1:30PM   Report to [facility] by 5:30 AM pick up the phone at the front desk dial [phone], have a seat in the lobby and a staff member will come and escort you to Short Stay Department   Call this number if you have problems the morning of surgery [phone]   Do not eat food or drink liquids :After Midnight.   Do NOT smoke after Midnight   DRINK 2 PRESURGERY ENSURE DRINKS THE NIGHT BEFORE SURGERY AT 1000 PM    1 PRESURGERY DRINK THE DAY OF THE PROCEDURE 3 HOURS PRIOR TO SCHEDULED SURGERY.   Take these medicines the morning of surgery with A SIP OF WATER: Prednisone, Mestinon                               You may not have any metal on your body including hair pins, jewelry, and body piercings             Do not wear make-up, lotions, powders, perfumes/cologne, or deodorant             Do not wear nail polish.  Do not shave  48 hours prior to surgery.                Do not bring valuables to the hospital. [place].   Contacts, dentures or bridgework may not be worn into surgery.   Leave suitcase in the car. After surgery it may be brought to your room.   Special Instructions: Bring a copy of your healthcare power of attorney and living will documents         the day of surgery if you haven't scanned them in before.              Please read over the following fact sheets you were given:  [facility] - Preparing for Surgery Before surgery, you can play an important role.  Because skin is not sterile, your skin needs to be as free of germs as possible.  You can reduce the number of germs on your skin by washing with CHG (chlorahexidine gluconate) soap before surgery.  CHG is an antiseptic cleaner which kills germs and bonds with the skin to continue killing germs even after washing. Please DO NOT use if you have an  allergy to CHG or antibacterial soaps.  If your skin becomes reddened/irritated stop using the CHG and inform your nurse when you arrive at Short Stay. Do not shave (including legs and underarms) for at least 48 hours prior to the first CHG shower.  You may shave your face/neck.  Please follow these instructions carefully:  1.  Shower with CHG Soap the night before surgery and the  morning of surgery.  2.  If you choose to wash your hair, wash your hair first as usual with your normal  shampoo.  3.  After you shampoo, rinse your hair and body thoroughly to remove the shampoo.                             4.  Use CHG as you would any other liquid soap.  You can apply chg  directly to the skin and wash.  Gently with a scrungie or clean washcloth.  5.  Apply the CHG Soap to your body ONLY FROM THE NECK DOWN.   Do   not use on face/ open                           Wound or open sores. Avoid contact with eyes, ears mouth and   genitals (private parts).                       Wash face,  Genitals (private parts) with your normal soap.             6.  Wash thoroughly, paying special attention to the area where your    surgery  will be performed.  7.  Thoroughly rinse your body with warm water from the neck down.  8.  DO NOT shower/wash with your normal soap after using and rinsing off the CHG Soap.                9.  Pat yourself dry with a clean towel.            10.  Wear clean pajamas.            11.  Place clean sheets on your bed the night of your first shower and do not  sleep with pets. Day of Surgery : Do not apply any lotions/deodorants the morning of surgery.  Please wear clean clothes to the hospital/surgery center.  FAILURE TO FOLLOW THESE INSTRUCTIONS MAY RESULT IN THE CANCELLATION OF YOUR SURGERY  PATIENT SIGNATURE_________________________________  NURSE SIGNATURE__________________________________  ________________________________________________________________________   Adam Phenix  An incentive spirometer is a tool that can help keep your lungs clear and active. This tool measures how well you are filling your lungs with each breath. Taking long deep breaths may help reverse or decrease the chance of developing breathing (pulmonary) problems (especially infection) following:  A long period of time when you are unable to move or be active. BEFORE THE PROCEDURE   If the spirometer includes an indicator to show your best effort, your nurse or respiratory therapist will set it to a desired goal.  If possible, sit up straight or lean slightly forward. Try not to slouch.  Hold the incentive spirometer in an upright position. INSTRUCTIONS FOR USE  1. Sit on the edge of your bed if possible, or sit up as far as you can in bed or on a chair. 2. Hold the incentive spirometer in an upright position. 3. Breathe out normally. 4. Place the mouthpiece in your mouth and seal your lips tightly around it. 5. Breathe in slowly and as deeply as possible, raising the piston or the ball toward the top of the column. 6. Hold your breath for 3-5 seconds or for as long as possible. Allow the piston or ball to fall to the bottom of the column. 7. Remove the mouthpiece from your mouth and breathe out normally. 8. Rest for a few seconds and repeat Steps 1 through 7 at least 10 times every 1-2 hours when you are awake. Take your time and take a few normal breaths between deep breaths. 9. The spirometer may include an indicator to show your best effort. Use the indicator as a goal to work toward during each repetition. 10. After each set of 10 deep breaths, practice coughing to be sure your lungs are  clear. If you have an incision (the cut made at the time of surgery), support your incision when coughing by placing a pillow or rolled up towels firmly against it. Once you are able to get out of bed, walk around indoors and cough well. You may stop using the incentive spirometer when  instructed by your caregiver.  RISKS AND COMPLICATIONS  Take your time so you do not get dizzy or light-headed.  If you are in pain, you may need to take or ask for pain medication before doing incentive spirometry. It is harder to take a deep breath if you are having pain. AFTER USE  Rest and breathe slowly and easily.  It can be helpful to keep track of a log of your progress. Your caregiver can provide you with a simple table to help with this. If you are using the spirometer at home, follow these instructions: [place] IF:   You are having difficultly using the spirometer.  You have trouble using the spirometer as often as instructed.  Your pain medication is not giving enough relief while using the spirometer.  You develop fever of 100.5 F (38.1 C) or higher. SEEK IMMEDIATE MEDICAL CARE IF:   You cough up bloody sputum that had not been present before.  You develop fever of 102 F (38.9 C) or greater.  You develop worsening pain at or near the incision site. MAKE SURE YOU:   Understand these instructions.  Will watch your condition.  Will get help right away if you are not doing well or get worse. Document Released: 05/10/2006 Document Revised: 03/22/2011 Document Reviewed: 07/11/2006 [facility] Patient Information 2014 [place], [place].   ________________________________________________________________________

## 2017-03-17 MED ORDER — BUPIVACAINE LIPOSOME 1.3 % IJ SUSP
20.0000 mL | INTRAMUSCULAR | Status: DC
  Filled 2017-03-17: qty 20

## 2017-03-17 NOTE — Anesthesia Preprocedure Evaluation (Addendum)
Anesthesia Evaluation  Patient identified by MRN, date of birth, ID band Patient awake    Reviewed: Allergy & Precautions, NPO status , Patient's Chart, lab work & pertinent test results  History of Anesthesia Complications (+) PONV and history of anesthetic complications  Airway Mallampati: II  TM Distance: >3 FB Neck ROM: Full    Dental no notable dental hx.    Pulmonary shortness of breath, pneumonia,    Pulmonary exam normal breath sounds clear to auscultation       Cardiovascular hypertension, + CAD and + Peripheral Vascular Disease  Normal cardiovascular exam+ Valvular Problems/Murmurs  Rhythm:Regular Rate:Normal     Neuro/Psych  Neuromuscular disease negative psych ROS   GI/Hepatic Neg liver ROS, hiatal hernia, GERD  ,  Endo/Other  negative endocrine ROS  Renal/GU negative Renal ROS     Musculoskeletal  (+) Arthritis ,   Abdominal (+) + obese,   Peds negative pediatric ROS (+)  Hematology negative hematology ROS (+)   Anesthesia Other Findings   Reproductive/Obstetrics negative OB ROS                            Anesthesia Physical Anesthesia Plan  ASA: IV  Anesthesia Plan: General   Post-op Pain Management:    Induction: Intravenous  PONV Risk Score and Plan: 4 or greater and Ondansetron, Treatment may vary due to age or medical condition and Dexamethasone  Airway Management Planned: Oral ETT  Additional Equipment:   Intra-op Plan:   Post-operative Plan: Extubation in OR  Informed Consent: I have reviewed the patients History and Physical, chart, labs and discussed the procedure including the risks, benefits and alternatives for the proposed anesthesia with the patient or authorized representative who has indicated his/her understanding and acceptance.   Dental advisory given  Plan Discussed with: CRNA  Anesthesia Plan Comments:        Anesthesia Quick  Evaluation

## 2017-03-18 ENCOUNTER — Inpatient Hospital Stay (HOSPITAL_COMMUNITY): Payer: Medicare Other

## 2017-03-18 ENCOUNTER — Encounter (HOSPITAL_COMMUNITY)
Admission: RE | Disposition: A | Discharge status: Home Health | Payer: Self-pay | Source: Ambulatory Visit | Attending: General Surgery

## 2017-03-18 ENCOUNTER — Encounter (HOSPITAL_COMMUNITY): Payer: Self-pay | Admitting: *Deleted

## 2017-03-18 ENCOUNTER — Inpatient Hospital Stay (HOSPITAL_COMMUNITY): Payer: Medicare Other | Admitting: Registered Nurse

## 2017-03-18 ENCOUNTER — Inpatient Hospital Stay (HOSPITAL_COMMUNITY)
Admission: RE | Admit: 2017-03-18 | Discharge: 2017-03-26 | DRG: 330 | Disposition: A | Discharge status: Home Health | Payer: Medicare Other | Source: Ambulatory Visit | Attending: General Surgery | Admitting: General Surgery

## 2017-03-18 DIAGNOSIS — Z87440 Personal history of urinary (tract) infections: Secondary | ICD-10-CM

## 2017-03-18 DIAGNOSIS — Z8249 Family history of ischemic heart disease and other diseases of the circulatory system: Secondary | ICD-10-CM

## 2017-03-18 DIAGNOSIS — I480 Paroxysmal atrial fibrillation: Secondary | ICD-10-CM | POA: Diagnosis present

## 2017-03-18 DIAGNOSIS — K5720 Diverticulitis of large intestine with perforation and abscess without bleeding: Principal | ICD-10-CM | POA: Diagnosis present

## 2017-03-18 DIAGNOSIS — I2510 Atherosclerotic heart disease of native coronary artery without angina pectoris: Secondary | ICD-10-CM | POA: Diagnosis present

## 2017-03-18 DIAGNOSIS — M549 Dorsalgia, unspecified: Secondary | ICD-10-CM | POA: Diagnosis not present

## 2017-03-18 DIAGNOSIS — L409 Psoriasis, unspecified: Secondary | ICD-10-CM | POA: Diagnosis present

## 2017-03-18 DIAGNOSIS — D62 Acute posthemorrhagic anemia: Secondary | ICD-10-CM | POA: Diagnosis not present

## 2017-03-18 DIAGNOSIS — Z823 Family history of stroke: Secondary | ICD-10-CM | POA: Diagnosis not present

## 2017-03-18 DIAGNOSIS — E785 Hyperlipidemia, unspecified: Secondary | ICD-10-CM | POA: Diagnosis present

## 2017-03-18 DIAGNOSIS — Z6841 Body Mass Index (BMI) 40.0 and over, adult: Secondary | ICD-10-CM

## 2017-03-18 DIAGNOSIS — N289 Disorder of kidney and ureter, unspecified: Secondary | ICD-10-CM | POA: Diagnosis not present

## 2017-03-18 DIAGNOSIS — G7000 Myasthenia gravis without (acute) exacerbation: Secondary | ICD-10-CM | POA: Diagnosis present

## 2017-03-18 DIAGNOSIS — I4891 Unspecified atrial fibrillation: Secondary | ICD-10-CM | POA: Diagnosis not present

## 2017-03-18 DIAGNOSIS — N321 Vesicointestinal fistula: Secondary | ICD-10-CM | POA: Diagnosis present

## 2017-03-18 DIAGNOSIS — Z880 Allergy status to penicillin: Secondary | ICD-10-CM

## 2017-03-18 DIAGNOSIS — Z801 Family history of malignant neoplasm of trachea, bronchus and lung: Secondary | ICD-10-CM | POA: Diagnosis not present

## 2017-03-18 DIAGNOSIS — L958 Other vasculitis limited to the skin: Secondary | ICD-10-CM | POA: Diagnosis present

## 2017-03-18 DIAGNOSIS — I10 Essential (primary) hypertension: Secondary | ICD-10-CM | POA: Diagnosis present

## 2017-03-18 DIAGNOSIS — K56699 Other intestinal obstruction unspecified as to partial versus complete obstruction: Secondary | ICD-10-CM | POA: Diagnosis present

## 2017-03-18 DIAGNOSIS — N3020 Other chronic cystitis without hematuria: Secondary | ICD-10-CM | POA: Diagnosis present

## 2017-03-18 DIAGNOSIS — K632 Fistula of intestine: Secondary | ICD-10-CM | POA: Diagnosis not present

## 2017-03-18 DIAGNOSIS — Z91041 Radiographic dye allergy status: Secondary | ICD-10-CM

## 2017-03-18 DIAGNOSIS — I890 Lymphedema, not elsewhere classified: Secondary | ICD-10-CM | POA: Diagnosis present

## 2017-03-18 DIAGNOSIS — Z7982 Long term (current) use of aspirin: Secondary | ICD-10-CM

## 2017-03-18 DIAGNOSIS — Z981 Arthrodesis status: Secondary | ICD-10-CM | POA: Diagnosis not present

## 2017-03-18 DIAGNOSIS — Z7952 Long term (current) use of systemic steroids: Secondary | ICD-10-CM

## 2017-03-18 DIAGNOSIS — J90 Pleural effusion, not elsewhere classified: Secondary | ICD-10-CM | POA: Diagnosis not present

## 2017-03-18 DIAGNOSIS — N390 Urinary tract infection, site not specified: Secondary | ICD-10-CM | POA: Diagnosis not present

## 2017-03-18 DIAGNOSIS — Z408 Encounter for other prophylactic surgery: Secondary | ICD-10-CM | POA: Diagnosis not present

## 2017-03-18 HISTORY — PX: PARTIAL COLECTOMY: SHX5273

## 2017-03-18 HISTORY — DX: Other specified postprocedural states: R11.2

## 2017-03-18 HISTORY — DX: Other specified postprocedural states: Z98.890

## 2017-03-18 HISTORY — PX: UMBILICAL HERNIA REPAIR: SHX196

## 2017-03-18 HISTORY — PX: CYSTOSCOPY WITH STENT PLACEMENT: SHX5790

## 2017-03-18 LAB — TYPE AND SCREEN
ABO/RH(D): AB POS
ANTIBODY SCREEN: NEGATIVE

## 2017-03-18 LAB — ABO/RH: ABO/RH(D): AB POS

## 2017-03-18 SURGERY — COLECTOMY, PARTIAL
Anesthesia: General | Site: Ureter

## 2017-03-18 MED ORDER — DEXAMETHASONE SODIUM PHOSPHATE 10 MG/ML IJ SOLN
INTRAMUSCULAR | Status: AC
  Filled 2017-03-18: qty 1

## 2017-03-18 MED ORDER — DIPHENHYDRAMINE HCL 50 MG/ML IJ SOLN: 12.5000 mg | Freq: Four times a day (QID) | INTRAMUSCULAR | Status: DC | PRN

## 2017-03-18 MED ORDER — HYDROMORPHONE HCL 1 MG/ML IJ SOLN
INTRAMUSCULAR | Status: AC
  Filled 2017-03-18: qty 1

## 2017-03-18 MED ORDER — DIPHENHYDRAMINE HCL 12.5 MG/5ML PO ELIX: 12.5000 mg | ORAL_SOLUTION | Freq: Four times a day (QID) | ORAL | Status: DC | PRN

## 2017-03-18 MED ORDER — LIDOCAINE 2% (20 MG/ML) 5 ML SYRINGE
INTRAMUSCULAR | Status: DC | PRN
  Administered 2017-03-18: 100 mg via INTRAVENOUS

## 2017-03-18 MED ORDER — BUPIVACAINE-EPINEPHRINE 0.5% -1:200000 IJ SOLN
INTRAMUSCULAR | Status: AC
  Filled 2017-03-18: qty 1

## 2017-03-18 MED ORDER — LOSARTAN POTASSIUM 50 MG PO TABS
100.0000 mg | ORAL_TABLET | Freq: Every day | ORAL | Status: DC
  Administered 2017-03-21 – 2017-03-26 (×3): 100 mg via ORAL
  Filled 2017-03-18 (×7): qty 2

## 2017-03-18 MED ORDER — LIDOCAINE HCL 2 % IJ SOLN
INTRAMUSCULAR | Status: AC
  Filled 2017-03-18: qty 20

## 2017-03-18 MED ORDER — LACTATED RINGERS IV SOLN
INTRAVENOUS | Status: DC | PRN
  Administered 2017-03-18 (×3): via INTRAVENOUS

## 2017-03-18 MED ORDER — EPHEDRINE SULFATE-NACL 50-0.9 MG/10ML-% IV SOSY
PREFILLED_SYRINGE | INTRAVENOUS | Status: DC | PRN
  Administered 2017-03-18: 5 mg via INTRAVENOUS

## 2017-03-18 MED ORDER — SODIUM CHLORIDE 0.9 % IR SOLN
Status: DC | PRN
  Administered 2017-03-18: 3000 mL

## 2017-03-18 MED ORDER — BUPIVACAINE-EPINEPHRINE (PF) 0.5% -1:200000 IJ SOLN
INTRAMUSCULAR | Status: DC | PRN
  Administered 2017-03-18: 30 mL

## 2017-03-18 MED ORDER — PROPOFOL 10 MG/ML IV BOLUS
INTRAVENOUS | Status: AC
  Filled 2017-03-18: qty 40

## 2017-03-18 MED ORDER — SACCHAROMYCES BOULARDII 250 MG PO CAPS
250.0000 mg | ORAL_CAPSULE | Freq: Two times a day (BID) | ORAL | Status: DC
  Administered 2017-03-18 – 2017-03-26 (×15): 250 mg via ORAL
  Filled 2017-03-18 (×17): qty 1

## 2017-03-18 MED ORDER — INDIGOTINDISULFONATE SODIUM 8 MG/ML IJ SOLN
INTRAMUSCULAR | Status: AC
  Filled 2017-03-18: qty 5

## 2017-03-18 MED ORDER — LIDOCAINE HCL 2 % IJ SOLN
INTRAMUSCULAR | Status: AC
  Filled 2017-03-18: qty 40

## 2017-03-18 MED ORDER — FENTANYL CITRATE (PF) 100 MCG/2ML IJ SOLN
INTRAMUSCULAR | Status: DC | PRN
  Administered 2017-03-18 (×4): 50 ug via INTRAVENOUS

## 2017-03-18 MED ORDER — SUGAMMADEX SODIUM 500 MG/5ML IV SOLN
INTRAVENOUS | Status: DC | PRN
  Administered 2017-03-18: 300 mg via INTRAVENOUS

## 2017-03-18 MED ORDER — CLINDAMYCIN PHOSPHATE 900 MG/50ML IV SOLN
900.0000 mg | Freq: Three times a day (TID) | INTRAVENOUS | Status: AC
  Administered 2017-03-18: 900 mg via INTRAVENOUS
  Filled 2017-03-18: qty 50

## 2017-03-18 MED ORDER — HYDROMORPHONE HCL 1 MG/ML IJ SOLN
0.2500 mg | INTRAMUSCULAR | Status: DC | PRN
  Administered 2017-03-18: 0.25 mg via INTRAVENOUS
  Administered 2017-03-18 (×3): 0.5 mg via INTRAVENOUS
  Administered 2017-03-18: 0.25 mg via INTRAVENOUS

## 2017-03-18 MED ORDER — GENTAMICIN SULFATE 40 MG/ML IJ SOLN
5.0000 mg/kg | INTRAVENOUS | Status: AC
  Administered 2017-03-18: 400 mg via INTRAVENOUS
  Filled 2017-03-18: qty 10

## 2017-03-18 MED ORDER — MIDAZOLAM HCL 2 MG/2ML IJ SOLN
INTRAMUSCULAR | Status: AC
  Filled 2017-03-18: qty 2

## 2017-03-18 MED ORDER — LIDOCAINE 2% (20 MG/ML) 5 ML SYRINGE
INTRAMUSCULAR | Status: DC | PRN
  Administered 2017-03-18: 1 mg/kg/h via INTRAVENOUS

## 2017-03-18 MED ORDER — HYDROMORPHONE HCL 1 MG/ML IJ SOLN
0.5000 mg | INTRAMUSCULAR | Status: DC | PRN
  Administered 2017-03-18 – 2017-03-20 (×7): 0.5 mg via INTRAVENOUS
  Filled 2017-03-18 (×7): qty 0.5

## 2017-03-18 MED ORDER — KETAMINE HCL 10 MG/ML IJ SOLN
INTRAMUSCULAR | Status: AC
  Filled 2017-03-18: qty 1

## 2017-03-18 MED ORDER — ENOXAPARIN SODIUM 40 MG/0.4ML ~~LOC~~ SOLN
40.0000 mg | SUBCUTANEOUS | Status: DC
  Administered 2017-03-19 – 2017-03-24 (×6): 40 mg via SUBCUTANEOUS
  Filled 2017-03-18 (×6): qty 0.4

## 2017-03-18 MED ORDER — ALVIMOPAN 12 MG PO CAPS
12.0000 mg | ORAL_CAPSULE | ORAL | Status: AC
  Administered 2017-03-18: 12 mg via ORAL
  Filled 2017-03-18: qty 1

## 2017-03-18 MED ORDER — KCL IN DEXTROSE-NACL 20-5-0.45 MEQ/L-%-% IV SOLN
INTRAVENOUS | Status: DC
  Administered 2017-03-18 – 2017-03-21 (×6): via INTRAVENOUS
  Administered 2017-03-22: 1000 mL via INTRAVENOUS
  Filled 2017-03-18 (×8): qty 1000

## 2017-03-18 MED ORDER — ONDANSETRON HCL 4 MG/2ML IJ SOLN
INTRAMUSCULAR | Status: DC | PRN
  Administered 2017-03-18: 4 mg via INTRAVENOUS

## 2017-03-18 MED ORDER — ALUM & MAG HYDROXIDE-SIMETH 200-200-20 MG/5ML PO SUSP
30.0000 mL | Freq: Four times a day (QID) | ORAL | Status: DC | PRN
  Filled 2017-03-18: qty 30

## 2017-03-18 MED ORDER — SPIRONOLACTONE 25 MG PO TABS
25.0000 mg | ORAL_TABLET | Freq: Every day | ORAL | Status: DC
  Administered 2017-03-21 – 2017-03-26 (×5): 25 mg via ORAL
  Filled 2017-03-18 (×7): qty 1

## 2017-03-18 MED ORDER — PROMETHAZINE HCL 25 MG/ML IJ SOLN: 6.2500 mg | INTRAMUSCULAR | Status: DC | PRN

## 2017-03-18 MED ORDER — MIDAZOLAM HCL 5 MG/5ML IJ SOLN
INTRAMUSCULAR | Status: DC | PRN
  Administered 2017-03-18: 2 mg via INTRAVENOUS

## 2017-03-18 MED ORDER — FENTANYL CITRATE (PF) 100 MCG/2ML IJ SOLN
INTRAMUSCULAR | Status: AC
  Filled 2017-03-18: qty 2

## 2017-03-18 MED ORDER — ONDANSETRON HCL 4 MG/2ML IJ SOLN
4.0000 mg | Freq: Four times a day (QID) | INTRAMUSCULAR | Status: DC | PRN
  Administered 2017-03-18 – 2017-03-24 (×4): 4 mg via INTRAVENOUS
  Filled 2017-03-18 (×5): qty 2

## 2017-03-18 MED ORDER — SUGAMMADEX SODIUM 500 MG/5ML IV SOLN
INTRAVENOUS | Status: AC
  Filled 2017-03-18: qty 5

## 2017-03-18 MED ORDER — KETAMINE HCL 10 MG/ML IJ SOLN
INTRAMUSCULAR | Status: DC | PRN
  Administered 2017-03-18: 30 mg via INTRAVENOUS

## 2017-03-18 MED ORDER — SODIUM CHLORIDE 0.9 % IV BOLUS (SEPSIS)
1000.0000 mL | Freq: Once | INTRAVENOUS | Status: AC
  Administered 2017-03-18: 1000 mL via INTRAVENOUS

## 2017-03-18 MED ORDER — EPHEDRINE 5 MG/ML INJ
INTRAVENOUS | Status: AC
  Filled 2017-03-18: qty 10

## 2017-03-18 MED ORDER — MEPERIDINE HCL 50 MG/ML IJ SOLN: 6.2500 mg | INTRAMUSCULAR | Status: DC | PRN

## 2017-03-18 MED ORDER — ROCURONIUM BROMIDE 10 MG/ML (PF) SYRINGE
PREFILLED_SYRINGE | INTRAVENOUS | Status: DC | PRN
  Administered 2017-03-18 (×2): 20 mg via INTRAVENOUS
  Administered 2017-03-18: 10 mg via INTRAVENOUS
  Administered 2017-03-18: 50 mg via INTRAVENOUS

## 2017-03-18 MED ORDER — IOHEXOL 300 MG/ML  SOLN
INTRAMUSCULAR | Status: DC | PRN
  Administered 2017-03-18: 14 mL

## 2017-03-18 MED ORDER — PYRIDOSTIGMINE BROMIDE 60 MG PO TABS
60.0000 mg | ORAL_TABLET | Freq: Three times a day (TID) | ORAL | Status: DC
  Administered 2017-03-18 – 2017-03-26 (×21): 60 mg via ORAL
  Filled 2017-03-18 (×25): qty 1

## 2017-03-18 MED ORDER — FUROSEMIDE 40 MG PO TABS
40.0000 mg | ORAL_TABLET | Freq: Every day | ORAL | Status: DC
Start: 2017-03-20 — End: 2017-03-20

## 2017-03-18 MED ORDER — ONDANSETRON HCL 4 MG PO TABS
4.0000 mg | ORAL_TABLET | Freq: Four times a day (QID) | ORAL | Status: DC | PRN
  Administered 2017-03-24: 4 mg via ORAL
  Filled 2017-03-18: qty 1

## 2017-03-18 MED ORDER — BUPIVACAINE LIPOSOME 1.3 % IJ SUSP
INTRAMUSCULAR | Status: DC | PRN
  Administered 2017-03-18: 20 mL

## 2017-03-18 MED ORDER — ONDANSETRON HCL 4 MG/2ML IJ SOLN
INTRAMUSCULAR | Status: AC
  Filled 2017-03-18: qty 2

## 2017-03-18 MED ORDER — CLINDAMYCIN PHOSPHATE 900 MG/50ML IV SOLN
900.0000 mg | INTRAVENOUS | Status: AC
  Administered 2017-03-18: 900 mg via INTRAVENOUS
  Filled 2017-03-18: qty 50

## 2017-03-18 MED ORDER — ACETAMINOPHEN 500 MG PO TABS
1000.0000 mg | ORAL_TABLET | ORAL | Status: AC
  Administered 2017-03-18: 1000 mg via ORAL
  Filled 2017-03-18: qty 2

## 2017-03-18 MED ORDER — 0.9 % SODIUM CHLORIDE (POUR BTL) OPTIME
TOPICAL | Status: DC | PRN
  Administered 2017-03-18: 2000 mL

## 2017-03-18 MED ORDER — ACETAMINOPHEN 500 MG PO TABS
1000.0000 mg | ORAL_TABLET | Freq: Four times a day (QID) | ORAL | Status: DC
  Administered 2017-03-18 (×2): 1000 mg via ORAL
  Filled 2017-03-18 (×3): qty 2

## 2017-03-18 MED ORDER — DEXAMETHASONE SODIUM PHOSPHATE 10 MG/ML IJ SOLN
INTRAMUSCULAR | Status: DC | PRN
  Administered 2017-03-18: 10 mg via INTRAVENOUS

## 2017-03-18 MED ORDER — PROPOFOL 10 MG/ML IV BOLUS
INTRAVENOUS | Status: DC | PRN
  Administered 2017-03-18: 200 mg via INTRAVENOUS

## 2017-03-18 MED ORDER — PROMETHAZINE HCL 25 MG/ML IJ SOLN
INTRAMUSCULAR | Status: AC
  Filled 2017-03-18: qty 1

## 2017-03-18 MED ORDER — 0.9 % SODIUM CHLORIDE (POUR BTL) OPTIME
TOPICAL | Status: DC | PRN
  Administered 2017-03-18: 1000 mL

## 2017-03-18 MED ORDER — ALVIMOPAN 12 MG PO CAPS
12.0000 mg | ORAL_CAPSULE | Freq: Two times a day (BID) | ORAL | Status: DC
  Administered 2017-03-19: 12 mg via ORAL
  Filled 2017-03-18: qty 1

## 2017-03-18 MED ORDER — PREDNISONE 5 MG PO TABS
5.0000 mg | ORAL_TABLET | Freq: Every day | ORAL | Status: DC
  Administered 2017-03-19 – 2017-03-26 (×8): 5 mg via ORAL
  Filled 2017-03-18 (×8): qty 1

## 2017-03-18 MED ORDER — ALBUMIN HUMAN 5 % IV SOLN
INTRAVENOUS | Status: DC | PRN
  Administered 2017-03-18: 08:00:00 via INTRAVENOUS

## 2017-03-18 MED ORDER — ALBUMIN HUMAN 5 % IV SOLN
INTRAVENOUS | Status: AC
  Filled 2017-03-18: qty 250

## 2017-03-18 MED ORDER — ENSURE SURGERY PO LIQD
237.0000 mL | Freq: Two times a day (BID) | ORAL | Status: DC
  Filled 2017-03-18 (×5): qty 237

## 2017-03-18 SURGICAL SUPPLY — 78 items
ADAPTER GOLDBERG URETERAL (ADAPTER) ×4 IMPLANT
BAG URO CATCHER STRL LF (MISCELLANEOUS) ×4 IMPLANT
BENZOIN TINCTURE PRP APPL 2/3 (GAUZE/BANDAGES/DRESSINGS) IMPLANT
BLADE EXTENDED COATED 6.5IN (ELECTRODE) IMPLANT
CATH INTERMIT  6FR 70CM (CATHETERS) ×8 IMPLANT
CELLS DAT CNTRL 66122 CELL SVR (MISCELLANEOUS) IMPLANT
CHLORAPREP W/TINT 26ML (MISCELLANEOUS) ×4 IMPLANT
CLOSURE WOUND 1/2 X4 (GAUZE/BANDAGES/DRESSINGS)
CLOTH BEACON ORANGE TIMEOUT ST (SAFETY) ×4 IMPLANT
COUNTER NEEDLE 20 DBL MAG RED (NEEDLE) ×4 IMPLANT
COVER FOOTSWITCH UNIV (MISCELLANEOUS) IMPLANT
COVER MAYO STAND STRL (DRAPES) ×12 IMPLANT
COVER SURGICAL LIGHT HANDLE (MISCELLANEOUS) ×4 IMPLANT
DECANTER SPIKE VIAL GLASS SM (MISCELLANEOUS) ×4 IMPLANT
DERMABOND ADVANCED (GAUZE/BANDAGES/DRESSINGS) ×2
DERMABOND ADVANCED .7 DNX12 (GAUZE/BANDAGES/DRESSINGS) ×2 IMPLANT
DRAIN CHANNEL 19F RND (DRAIN) IMPLANT
DRAPE LAPAROSCOPIC ABDOMINAL (DRAPES) ×4 IMPLANT
DRSG OPSITE POSTOP 4X10 (GAUZE/BANDAGES/DRESSINGS) IMPLANT
DRSG OPSITE POSTOP 4X6 (GAUZE/BANDAGES/DRESSINGS) IMPLANT
DRSG OPSITE POSTOP 4X8 (GAUZE/BANDAGES/DRESSINGS) IMPLANT
ELECT PENCIL ROCKER SW 15FT (MISCELLANEOUS) ×8 IMPLANT
ELECT REM PT RETURN 15FT ADLT (MISCELLANEOUS) ×4 IMPLANT
EVACUATOR SILICONE 100CC (DRAIN) IMPLANT
GAUZE SPONGE 4X4 12PLY STRL (GAUZE/BANDAGES/DRESSINGS) ×4 IMPLANT
GLOVE BIO SURGEON STRL SZ 6.5 (GLOVE) ×6 IMPLANT
GLOVE BIO SURGEONS STRL SZ 6.5 (GLOVE) ×2
GLOVE BIOGEL M STRL SZ7.5 (GLOVE) ×4 IMPLANT
GLOVE BIOGEL PI IND STRL 7.0 (GLOVE) ×4 IMPLANT
GLOVE BIOGEL PI INDICATOR 7.0 (GLOVE) ×4
GOWN STRL REUS W/TWL 2XL LVL3 (GOWN DISPOSABLE) ×8 IMPLANT
GOWN STRL REUS W/TWL LRG LVL3 (GOWN DISPOSABLE) ×8 IMPLANT
GOWN STRL REUS W/TWL XL LVL3 (GOWN DISPOSABLE) ×16 IMPLANT
GUIDEWIRE STR DUAL SENSOR (WIRE) ×4 IMPLANT
KIT BASIN OR (CUSTOM PROCEDURE TRAY) ×4 IMPLANT
KIT PREVENA INCISION MGT 13 (CANNISTER) ×4 IMPLANT
LEGGING LITHOTOMY PAIR STRL (DRAPES) IMPLANT
LUBRICANT JELLY K Y 4OZ (MISCELLANEOUS) ×4 IMPLANT
MANIFOLD NEPTUNE II (INSTRUMENTS) ×4 IMPLANT
NEEDLE HYPO 22GX1.5 SAFETY (NEEDLE) IMPLANT
PACK COLON (CUSTOM PROCEDURE TRAY) ×4 IMPLANT
PACK CYSTO (CUSTOM PROCEDURE TRAY) ×4 IMPLANT
PACK GENERAL/GYN (CUSTOM PROCEDURE TRAY) ×4 IMPLANT
PAD POSITIONING PINK XL (MISCELLANEOUS) ×4 IMPLANT
PLUG CATH AND CAP STER (CATHETERS) ×4 IMPLANT
POSITIONER SURGICAL ARM (MISCELLANEOUS) ×4 IMPLANT
RTRCTR WOUND ALEXIS 18CM MED (MISCELLANEOUS)
SEALER TISSUE G2 STRG ARTC 35C (ENDOMECHANICALS) IMPLANT
SEALER TISSUE X1 CVD JAW (INSTRUMENTS) IMPLANT
SET IRRIG Y TYPE TUR BLADDER L (SET/KITS/TRAYS/PACK) ×4 IMPLANT
SPONGE LAP 18X18 5 PK (GAUZE/BANDAGES/DRESSINGS) ×16 IMPLANT
STAPLER CUT CVD 40MM GREEN (STAPLE) ×4 IMPLANT
STAPLER CUT RELOAD GREEN (STAPLE) ×4 IMPLANT
STAPLER VISISTAT 35W (STAPLE) ×4 IMPLANT
STRIP CLOSURE SKIN 1/2X4 (GAUZE/BANDAGES/DRESSINGS) IMPLANT
SUT ETHILON 2 0 PS N (SUTURE) IMPLANT
SUT NOVA NAB GS-21 0 18 T12 DT (SUTURE) ×8 IMPLANT
SUT PDS AB 1 CTX 36 (SUTURE) ×8 IMPLANT
SUT PDS AB 1 TP1 96 (SUTURE) IMPLANT
SUT PROLENE 2 0 KS (SUTURE) ×4 IMPLANT
SUT SILK 2 0 (SUTURE) ×4
SUT SILK 2 0 SH CR/8 (SUTURE) ×4 IMPLANT
SUT SILK 2-0 18XBRD TIE 12 (SUTURE) ×4 IMPLANT
SUT SILK 3 0 (SUTURE) ×2
SUT SILK 3 0 SH CR/8 (SUTURE) ×4 IMPLANT
SUT SILK 3-0 18XBRD TIE 12 (SUTURE) ×2 IMPLANT
SUT VIC AB 2-0 SH 18 (SUTURE) ×8 IMPLANT
SUT VIC AB 2-0 SH 27 (SUTURE) ×2
SUT VIC AB 2-0 SH 27X BRD (SUTURE) ×2 IMPLANT
SUT VIC AB 4-0 PS2 18 (SUTURE) ×4 IMPLANT
SUT VIC AB 4-0 PS2 27 (SUTURE) ×4 IMPLANT
SYR CONTROL 10ML LL (SYRINGE) IMPLANT
TAPE CLOTH SURG 4X10 WHT LF (GAUZE/BANDAGES/DRESSINGS) ×4 IMPLANT
TOWEL OR 17X26 10 PK STRL BLUE (TOWEL DISPOSABLE) ×4 IMPLANT
TOWEL OR NON WOVEN STRL DISP B (DISPOSABLE) ×4 IMPLANT
TRAY FOLEY CATH 14FRSI W/METER (CATHETERS) ×4 IMPLANT
TUBING CONNECTING 10 (TUBING) ×6 IMPLANT
TUBING CONNECTING 10' (TUBING) ×2

## 2017-03-18 NOTE — Transfer of Care (Signed)
Immediate Anesthesia Transfer of Care Note  Patient: Kimberly [name]  Procedure(s) Performed: OPEN SIGMOIDECTOMY (N/A Abdomen) HERNIA REPAIR UMBILICAL ADULT ERAs pathway (N/A ) CYSTOSCOPY WITH RETROGRADE AND BILATERAL URETERAL STENT PLACEMENT (Bilateral Ureter)  Patient Location: PACU  Anesthesia Type:General  Level of Consciousness: sedated  Airway & Oxygen Therapy: Patient Spontanous Breathing and Patient connected to face mask oxygen  Post-op Assessment: Report given to RN and Post -op Vital signs reviewed and stable  Post vital signs: Reviewed and stable  Last Vitals:  Vitals:   03/18/17 0540  BP: 120/63  Pulse: 86  Resp: 16  Temp: 37.1 C  SpO2: 100%    Last Pain:  Vitals:   03/18/17 0540  TempSrc: Oral      Patients Stated Pain Goal: 4 (03/18/17 0623) stable   Complications: No apparent anesthesia complications

## 2017-03-18 NOTE — Consult Note (Signed)
H&P Physician requesting consult: Leighton Ruff  Chief Complaint: colovesical fistula  History of Present Illness: 72 YO F with colovesical fistula here for open partial colectomy w/ colovesical fistula repair. Intraop stent placement requested  Past Medical History:  Diagnosis Date  . Allergy   . Aortic atherosclerosis ([place])   . Atrial tachycardia ([place]) 05/12/2015  . Bilateral leg edema   . Cataract   . Colovesical fistula   . Diplopia   . Diverticulitis   . Diverticulosis   . DJD (degenerative joint disease), cervical   . Family history of ischemic heart disease   . Frequent UTI   . GERD (gastroesophageal reflux disease)   . Heart murmur   . History of hiatal hernia    Small hernia  . Hypertension   . Internal hemorrhoids 01/21/2017   noted on colonopscopy  . Knee pain, bilateral   . Leukocytoclastic vasculitis ([place])   . Myalgia and myositis, unspecified   . Myasthenia gravis without exacerbation (HCC)    [facility], Dr Anna Genre  . NSVT (nonsustained ventricular tachycardia) ([place]) 05/12/2015  . Osteoarthritis    Both knees  . Osteopenia   . Other and unspecified hyperlipidemia   . Periumbilical hernia   . Pneumonia 04/2016  . PONV (postoperative nausea and vomiting)   . Psoriasis    plantar ;Dr Jarome Matin  . Shoulder pain, bilateral   . Sleep difficulties   . Unspecified vitamin D deficiency    Dr Estanislado Pandy  . Vasculitis ([place])    Dr Estanislado Pandy  . Vitamin D deficiency    Past Surgical History:  Procedure Laterality Date  . APPENDECTOMY     & exploratory for inflammation; Dr Hassell Done  . CARDIAC CATHETERIZATION N/A 04/17/2015   Procedure: Left Heart Cath and Coronary Angiography;  Surgeon: Leonie Man, MD;  Location: [place] [facility];  Service: Cardiovascular;  Laterality: N/A;  . CERVICAL FUSION     C5-6; Dr Vertell Limber  . CHOLECYSTECTOMY     for stones  . COLONOSCOPY W/ POLYPECTOMY  01/21/2017  . IR RADIOLOGIST EVAL & MGMT  11/16/2016  . IR RADIOLOGIST EVAL & MGMT   12/08/2016  . IR RADIOLOGIST EVAL & MGMT  12/16/2016  . LAMINECTOMY  2010   T1-2; Dr Vertell Limber  . SHOULDER SURGERY     Left & Right  . THORACENTESIS     for peripneumonic effusion  . TOTAL ABDOMINAL HYSTERECTOMY W/ BILATERAL SALPINGOOPHORECTOMY     For Fibroids & endometriosis    Home Medications:  Facility-Administered Medications Prior to Admission  Medication Dose Route Frequency Provider Last Rate Last Dose  . 0.9 %  sodium chloride infusion  500 mL Intravenous Continuous Armbruster, Carlota Raspberry, MD       Medications Prior to Admission  Medication Sig Dispense Refill Last Dose  . acetaminophen (TYLENOL) 500 MG tablet Take 1,000 mg by mouth at bedtime as needed for moderate pain.     Marland Kitchen aspirin EC 81 MG tablet Take 81 mg by mouth at bedtime.   03/17/2017 at Unknown time  . augmented betamethasone dipropionate (DIPROLENE-AF) 0.05 % ointment Apply 1 application topically daily. For vasculitis and psoriasis   Taking  . CALCIUM PO Take 1,000 mg by mouth at bedtime.   03/17/2017 at Unknown time  . cholecalciferol (VITAMIN D) 1000 units tablet Take 1,000 Units by mouth at bedtime.   03/17/2017 at Unknown time  . folic acid (FOLVITE) 1 MG tablet TAKE 2 TABLETS BY MOUTH ONCE DAILY (Patient taking differently: TAKE 2  TABLETS BY MOUTH ONCE DAILY AT NIGHT) 180 tablet 4 03/17/2017 at Unknown time  . furosemide (LASIX) 40 MG tablet Take 1 tablet (40 mg total) by mouth daily. 90 tablet 1 03/17/2017 at Unknown time  . losartan (COZAAR) 100 MG tablet Take 1 tablet (100 mg total) by mouth daily. 90 tablet 3 03/17/2017 at Unknown time  . Multiple Vitamin (MULTIVITAMIN WITH MINERALS) TABS tablet Take 1 tablet by mouth at bedtime.   03/17/2017 at Unknown time  . predniSONE (DELTASONE) 5 MG tablet TAKE 1 TABLET BY MOUTH EVERY DAY WITH BREAKFAST AND WITH 2 PREDNISONE 1MG  TABLETS (Patient taking differently: TAKE 1 TABLET BY MOUTH EVERY DAY WITH BREAKFAST) 90 tablet 0 03/18/2017 at 0400  . pyridostigmine (MESTINON) 60 MG tablet  Take 1 tablet (60 mg total) by mouth 3 (three) times daily. 270 tablet 3 03/18/2017 at 0400  . pyridOXINE (VITAMIN B-6) 100 MG tablet Take 100 mg by mouth at bedtime.   03/17/2017 at Unknown time  . spironolactone (ALDACTONE) 25 MG tablet TAKE 1 TABLET BY MOUTH EVERY DAY 90 tablet 3 03/17/2017 at Unknown time  . traZODone (DESYREL) 50 MG tablet TAKE 1 TO 2 TABLETS BY MOUTH EVERY DAY AT BEDTIME AS NEEDED FOR SLEEP (Patient taking differently: Take 50 mgs by mouth at bedtime may take an additional 50 mg later if still awake) 30 tablet 1   . vitamin B-12 (CYANOCOBALAMIN) 1000 MCG tablet Take 1,000 mcg by mouth at bedtime.   03/17/2017 at Unknown time  . acetaminophen (TYLENOL) 325 MG tablet Take 2 tablets (650 mg total) by mouth every 6 (six) hours as needed for mild pain (or temp > 100). (Patient not taking: Reported on 03/02/2017)   Not Taking at Unknown time  . LORazepam (ATIVAN) 0.5 MG tablet Take 1 tablet (0.5 mg total) by mouth at bedtime. (Patient not taking: Reported on 02/09/2017) 30 tablet 1 Not Taking at Unknown time  . metroNIDAZOLE (FLAGYL) 500 MG tablet Take 1 tablet (500 mg total) by mouth 2 (two) times daily. (Patient not taking: Reported on 03/02/2017) 28 tablet 0 Completed Course at Unknown time  . predniSONE (DELTASONE) 1 MG tablet TAKE 2 TABLETS (2 MG TOTAL) BY MOUTH DAILY WITH BREAKFAST. TAKE WITH A 5 MG TABLET FOR A 7 MG DOSE (Patient not taking: Reported on 02/09/2017) 180 tablet 1 Not Taking at Unknown time  . sodium chloride flush (NS) 0.9 % SOLN Place 10 mLs into feeding tube daily. Into percutaneous/abdominal tube daily. 30 Syringe 0 Taking  . traMADol (ULTRAM) 50 MG tablet Take 1 tablet (50 mg total) by mouth every 12 (twelve) hours as needed (mild pain). (Patient not taking: Reported on 01/21/2017) 10 tablet 0 Completed Course at Unknown time   Allergies:  Allergies  Allergen Reactions  . Azithromycin Other (See Comments)    Angioedema  . Ibuprofen     800 mg - edema all over,  including lips and tongue  . Levaquin [Levofloxacin In D5w] Other (See Comments)    D/T MYASTHENIA GRAVIS  . Tetracycline Rash  . Isovue [Iopamidol] Dermatitis    Pt had a rash and blisters on bilat feet and lower part of legs.  Full premeds in the future.  This happened twice after IV contrast.   J Bohm  RTRCT  . Sulfonamide Derivatives     Seizure age 41 (Note: probably fever induced)  . Aleve [Naproxen] Other (See Comments)    Caused excessive salivation, throat tightness  . Tape Other (See Comments)  Causes blisters  Paper tape only  . Zosyn [Piperacillin Sod-Tazobactam So]     Severe leg rash, skin peeling  . Amlodipine Besy-Benazepril Hcl Cough  . Amoxicillin Swelling and Rash    Has patient had a PCN reaction causing immediate rash, facial/tongue/throat swelling, SOB or lightheadedness with hypotension: No Has patient had a PCN reaction causing severe rash involving mucus membranes or skin necrosis: No Has patient had a PCN reaction that required hospitalization: No Has patient had a PCN reaction occurring within the last 10 years: Yes If all of the above answers are "NO", then may proceed with Cephalosporin use.     Family History  Problem Relation Age of Onset  . Heart attack Mother 9  . Heart failure Mother   . Stroke Paternal Grandmother        in 16s  . Lung cancer Father   . Cancer Father        lung  . Hypertension Sister   . Hyperthyroidism Sister        Graves  . Hyperthyroidism Sister        Graves  . Diabetes Neg Hx   . Colon cancer Neg Hx   . Esophageal cancer Neg Hx   . Stomach cancer Neg Hx   . Rectal cancer Neg Hx    Social History:  reports that  has never smoked. she has never used smokeless tobacco. She reports that she does not drink alcohol or use drugs.  ROS: A complete review of systems was performed.  All systems are negative except for pertinent findings as noted. ROS   Physical Exam:  Vital signs in last 24 hours: Temp:  [98.7 F  (37.1 C)] 98.7 F (37.1 C) (03/08 0540) Pulse Rate:  [86] 86 (03/08 0540) Resp:  [16] 16 (03/08 0540) BP: (120)/(63) 120/63 (03/08 0540) SpO2:  [100 %] 100 % (03/08 0540) Weight:  [124.7 kg (275 lb)] 124.7 kg (275 lb) (03/08 0547) General:  Alert and oriented, No acute distress HEENT: Normocephalic, atraumatic Neck: No JVD or lymphadenopathy Cardiovascular: Regular rate and rhythm Lungs: Regular rate and effort Abdomen: Soft, nontender, nondistended, no abdominal masses Back: No CVA tenderness Extremities: No edema Neurologic: Grossly intact  Laboratory Data:  No results found for this or any previous visit (from the past 24 hour(s)). No results found for this or any previous visit (from the past 240 hour(s)). Creatinine: No results for input(s): CREATININE in the last 168 hours.  Impression/Assessment:  colovesical fistula  Plan:  Proceed with b/l ureteral stent placement  [name], [name] 03/18/2017, 7:36 AM

## 2017-03-18 NOTE — Interval H&P Note (Signed)
History and Physical Interval Note:  03/18/2017 7:38 AM  Kimberly [name]  has presented today for surgery, with the diagnosis of colovesicular fistula  The various methods of treatment have been discussed with the patient and family. After consideration of risks, benefits and other options for treatment, the patient has consented to  Procedure(s): OPEN PARTIAL COLECTOMY ERAs pathway (N/A) HERNIA REPAIR UMBILICAL ADULT ERAs pathway (N/A) CYSTOSCOPY WITH STENT PLACEMENT (N/A) as a surgical intervention .  The patient's history has been reviewed, patient examined, no change in status, stable for surgery.  I have reviewed the patient's chart and labs.  Questions were answered to the patient's satisfaction.     [name], [name]

## 2017-03-18 NOTE — Anesthesia Postprocedure Evaluation (Signed)
Anesthesia Post Note  Patient: Kimberly [name]  Procedure(s) Performed: OPEN SIGMOIDECTOMY (N/A Abdomen) OPEN HERNIA REPAIR UMBILICAL ADULT (N/A Abdomen) CYSTOSCOPY WITH RETROGRADE AND BILATERAL URETERAL STENT PLACEMENT (Bilateral Ureter)     Patient location during evaluation: PACU Anesthesia Type: General Level of consciousness: sedated and patient cooperative Pain management: pain level controlled Vital Signs Assessment: post-procedure vital signs reviewed and stable Respiratory status: spontaneous breathing Cardiovascular status: stable Anesthetic complications: no    Last Vitals:  Vitals:   03/18/17 1045 03/18/17 1100  BP: 136/66   Pulse: 77 72  Resp: 15 15  Temp:    SpO2: 100% 100%    Last Pain:  Vitals:   03/18/17 1100  TempSrc:   PainSc: [place]

## 2017-03-18 NOTE — Op Note (Signed)
03/18/2017  10:25 AM  PATIENT:  Kimberly [name]  72 y.o. female  Patient Care Team: [name], MD as PCP - General (Internal Medicine)  PRE-OPERATIVE DIAGNOSIS:  colovesicular fistula  POST-OPERATIVE DIAGNOSIS:  colovesicular fistula  PROCEDURE:   OPEN SIGMOIDECTOMY HERNIA REPAIR UMBILICAL ADULT  CYSTOSCOPY WITH RETROGRADE AND BILATERAL URETERAL STENT PLACEMENT   Surgeon(s): Leighton Ruff, MD Lucas Mallow, MD Johnathan Hausen, MD  ASSISTANT: Dr Hassell Done  ANESTHESIA:   general  EBL: 421ml  Total I/O In: 1250 [I.V.:1000; IV Piggyback:250] Out: 500 [Urine:100; Blood:400]  DRAINS: none   SPECIMEN:  Source of Specimen:  sigmoid colon  DISPOSITION OF SPECIMEN:  PATHOLOGY  COUNTS:  YES  PLAN OF CARE: Admit to inpatient   PATIENT DISPOSITION:  PACU - hemodynamically stable.  INDICATION: 72 y.o. morbidly obese F with myasthenias gravis on low dose prednisone with a colovesical fistula demonstrated on imaging after drain placement into an abscess cavity.     OR FINDINGS: inflammatory pocket over the left dome of the bladder.  No obvious leak of bladder with insufflation.  DESCRIPTION: the patient was identified in the preoperative holding area and taken to the OR where they were laid supine on the operating room table.  General anesthesia was induced without difficulty. SCDs were also noted to be in place prior to the initiation of anesthesia. The patient underwent cystoscopy and stent placement with Dr Gloriann Loan.  The patient was then placed in lithotomy position prepped and draped in the usual sterile fashion.   A surgical timeout was performed indicating the correct patient, procedure, positioning and need for preoperative antibiotics.  I began by making a lower midline incision with a 10 blade scalpel.  This was carried down through subcutaneous tissues using electrocautery.  Fascia was incised at midline.  The peritoneum was entered.  Adhesions were cleared from the  anterior abdominal wall.  A Balfour retractor was placed and the small bowel was packed out of the way.  I began by bluntly mobilizing the sigmoid colon off of the left pelvic sidewall.  I mobilized the drain and abscess cavity bluntly as well.  I then continued down to the colovesicular attachment.  This easily broke free with blunt dissection.  Once this was complete, I was able to free off the descending colon from the abdominal sidewall using blunt dissection and electrocautery.  This was completed up to the level of the splenic flexure.  The retroperitoneal plane was bluntly mobilized as well.  The patient had 2 active areas of diverticulitis.  The most proximal area was at the sigmoid descending junction.  I divided the rectosigmoid junction using a green load Contour stapler after bluntly mobilizing the mesorectal plane behind it.  I then divided the proximal colon using a second contour stapler.  Once the colon was divided, the mesentery was transected using cut and clamp technique with 2-0 silk sutures.  The IMA was also identified and clamped with a Claiborne Billings.  A suture was placed over this and tied tightly in place.  The remaining mesorectum was also divided using Bovie electrocautery and the hemorrhoidal artery was suture ligated.  The specimen was then sent off the field.  The pelvis was packed off.  I continue to mobilize the descending colon until easily reached into the pelvis.  I palpated the distal end of this and there was noted to be a palpable marginal artery pulse.  I remove the staple line using Bovie electrocautery.  I did not see  good mucosal bleeding.  I resected back a little bit further using Metzenbaum scissors.  There was some moderate mucosal bleeding at that point.  There is no pulsatile bleeding.  I placed a 2-0 Prolene pursestring suture.  I tied this tightly around a 33 mm EEA anvil.  The EEA stapler was introduced into the rectal stump and brought out through the staple line at  midline.  An anastomosis was created.  There was no tension.  There was no leak when tested with insufflation under water.  The abdomen was irrigated.  Hemostasis was good.  The bladder was then insufflated with methylene blue dyed saline.  We insufflated to approximately 300 mL of saline and no leak was identified.  I did place 2 interrupted 2-0 Vicryl sutures into the area of the abscess cavity to close this down a bit.  A tongue of omentum was then placed in between the bladder and the colonic anastomosis.  Exparel mixed with Marcaine with epinephrine was then placed laterally throughout the wound for postoperative pain control.  The Balfour and clamps were removed.  All sponges were removed and we switched to clean gowns, gloves, drapes and instruments.  I then closed the peritoneum using a running 2-0 Vicryl suture.  The fascia was then closed using 2 #1 PDS sutures.  The skin was closed with staples.  A 13 cm Prevena wound VAC was then placed over the closed wound and secured into place.  The ureteral stents were removed without difficulty.  The patient was then awakened from  anesthesia without difficulty and sent to the postanesthesia care unit in stable condition.  All counts were correct per operating room staff.

## 2017-03-18 NOTE — Op Note (Signed)
Operative Note  Preoperative diagnosis:  1.  Colovesical fistula  Postoperative diagnosis: 1.  Colovesical fistula  Procedure(s): 1.  Cystoscopy with bilateral retrograde pyelogram with interpretation, fluoroscopy less than 1 hour, bilateral open-ended ureteral catheter placement  Surgeon: Link Snuffer, MD  Assistants: None  Anesthesia: General  Complications: None immediate  EBL: None  Specimens: 1.  None  Drains/Catheters: 1.  Bilateral open-ended ureteral catheters  Intraoperative findings: 1.  Normal urethra 2.  Approximately 1 cm opening at the dome of the bladder consistent with colovesical fistula.  There is surrounding inflammation.  No other evidence of mass lesion.  Scattered erythema consistent with chronic cystitis. 3.  Left retrograde pyelogram revealed a well opacified ureter and kidney with no evidence of any filling defects. 4.  Right retrograde pyelogram also revealed a well opacified ureter and kidney with no evidence of any filling defects  Indication: 72 year old female who was evaluated for recurrent urinary tract infections and found on CT scan to have a colovesical fistula.  She presents for colovesical fistula repair with partial colectomy.  Intraoperative stents are requested.  Description of procedure:  The patient was identified and consent was obtained.  The patient was taken to the operating room and placed in the supine position.  The patient was placed under general anesthesia.  Perioperative antibiotics were administered.  The patient was placed in dorsal lithotomy.  Patient was prepped and draped in a standard sterile fashion and a timeout was performed.  A 21 French rigid cystoscope was advanced into the urethra and into the bladder.  A complete cystoscopy was performed with findings noted above.  The left ureter was cannulated with an open-ended ureteral catheter and a retrograde pyelogram was performed with findings noted above.  The open-ended  ureteral catheter was then carefully advanced up to the level of the kidney with no resistance noted.  The right ureter was then cannulated with an open-ended ureteral catheter and a retrograde pyelogram was performed with the findings noted above.  This open-ended ureteral catheter was advanced up to the kidney with no resistance noted here as well.  The scope was withdrawn.  A Foley catheter was placed and the open-ended ureteral catheters were secured.  This concluded the operation the patient tolerated the procedure well and the case was handed over to general surgery.  Plan: Per general surgery

## 2017-03-18 NOTE — Progress Notes (Signed)
Report received from Wells Guiles, RN at this time. Care assumed for this pt. Pt is alert and calm and has no s/s of distress. VS and orders assessed and will continue to monitor and tx pt according to MD orders.

## 2017-03-18 NOTE — Progress Notes (Signed)
Patient is nauseous and actively vomiting. She was given Zofran IV @ 2200. Her BP is 92/64. There has been no urine output since the beginning of the shift. Dr. Marcello Moores was paged. She ordered a bolus of NS over an hour. She told RN to notify her there hasn't been any urine output in the next three hours.

## 2017-03-18 NOTE — H&P (Signed)
72 year old female with a history of diverticulitis in 2017 who presents to the office for evaluation of recurrent urinary tract infections. She was seen by urologist and CT scan was ordered. This showed concern for colovesicular fistula and a 3.7 cm abscess. She was tried on Augmentin but developed a reaction to this and had to come off. I saw her in the office and recommended drain placement as an inpatient. She underwent this without much difficulty. She had trouble with irrigating her drain causing a pressure/pain sensation, but this has resolved. She continues to have cloudy urine. Her appetite is slightly better But still decreased. She was seen at [facility] and a colonoscopy was performed. Her stricture was unable to be traversed with the colonoscope. she has a drain in her left lower quadrant abscess placed by IR.  She has been diagnosed with myasthenia gravis since 2011 and takes methotrexate for this. She has coronary artery disease but has had a negative catheter recently. She has paroxysmal atrial fibrillation but is not anticoagulated currently. She has a history of an open appendectomy, open hysterectomy and open cholecystectomy. She also has a known umbilical hernia containing fat only.      Past Medical History:  Diagnosis Date  . Allergy   . Arthritis   . Atrial tachycardia ([place]) 05/12/2015  . Cataract   . Diplopia   . Family history of ischemic heart disease   . GERD (gastroesophageal reflux disease)   . Heart murmur   . Hypertension   . Myalgia and myositis, unspecified   . Myasthenia gravis without exacerbation ([facility])    [facility], Dr Anna Genre  . NSVT (nonsustained ventricular tachycardia) ([place]) 05/12/2015  . Other and unspecified hyperlipidemia   . Psoriasis    plantar ;Dr Jarome Matin  . Unspecified vitamin D deficiency    Dr Estanislado Pandy  . Vasculitis ([place])    Dr Estanislado Pandy        Past Surgical History:  Procedure Laterality Date  . APPENDECTOMY     & exploratory for  inflammation; Dr Hassell Done  . CARDIAC CATHETERIZATION N/A 04/17/2015   Procedure: Left Heart Cath and Coronary Angiography; Surgeon: Leonie Man, MD; Location: [facility]; Service: Cardiovascular; Laterality: N/A;  . CERVICAL FUSION     C5-6; Dr Vertell Limber  . CHOLECYSTECTOMY     for stones  . IR RADIOLOGIST EVAL & MGMT  11/16/2016  . IR RADIOLOGIST EVAL & MGMT  12/08/2016  . IR RADIOLOGIST EVAL & MGMT  12/16/2016  . LAMINECTOMY  2010   T1-2; Dr Vertell Limber  . SHOULDER SURGERY     Left & Right  . THORACENTESIS     for peripneumonic effusion  . TOTAL ABDOMINAL HYSTERECTOMY W/ BILATERAL SALPINGOOPHORECTOMY     For Fibroids & endometriosis        Allergies  Allergen Reactions  . Azithromycin Other (See Comments)    Angioedema  . Ibuprofen     800 mg - edema all over, including lips and tongue  . Levaquin [Levofloxacin In D5w] Other (See Comments)    D/T MYASTHENIA GRAVIS  . Tetracycline Rash  . Isovue [Iopamidol] Dermatitis    Pt had a rash and blisters on bilat feet and lower part of legs. Full premeds in the future. This happened twice after IV contrast. J Bohm RTRCT  . Sulfonamide Derivatives     Seizure age 32 (Note: probably fever induced)  . Aleve [Naproxen] Other (See Comments)    Caused excessive salivation  . Zosyn [Piperacillin Sod-Tazobactam  So]     Severe leg rash, skin peeling  . Amlodipine Besy-Benazepril Hcl Cough  . Amoxicillin Swelling and Rash    Has patient had a PCN reaction causing immediate rash, facial/tongue/throat swelling, SOB or lightheadedness with hypotension: No  Has patient had a PCN reaction causing severe rash involving mucus membranes or skin necrosis: No  Has patient had a PCN reaction that required hospitalization: No  Has patient had a PCN reaction occurring within the last 10 years: Yes  If all of the above answers are "NO", then may proceed with Cephalosporin use.        Outpatient Encounter Medications as of 01/31/2017  Medication Sig  .  acetaminophen (TYLENOL) 325 MG tablet Take 2 tablets (650 mg total) by mouth every 6 (six) hours as needed for mild pain (or temp > 100).  Marland Kitchen aspirin EC 81 MG tablet Take 81 mg by mouth at bedtime.  Marland Kitchen augmented betamethasone dipropionate (DIPROLENE-AF) 0.05 % ointment Apply 1 application topically daily. For vasculitis and psoriasis  . Calcium Carb-Cholecalciferol (CALCIUM 600-D PO) Take 600 mg by mouth at bedtime.  . cholecalciferol (VITAMIN D) 1000 units tablet Take 1,000 Units by mouth at bedtime.  . Cyanocobalamin (VITAMIN B-12 PO) Take 1 tablet by mouth at bedtime.  . folic acid (FOLVITE) 1 MG tablet TAKE 2 TABLETS BY MOUTH ONCE DAILY  . furosemide (LASIX) 40 MG tablet Take 1 tablet (40 mg total) by mouth daily.  Marland Kitchen LORazepam (ATIVAN) 0.5 MG tablet Take 1 tablet (0.5 mg total) by mouth at bedtime. (Patient taking differently: Take 0.5 mg by mouth as needed. )  . losartan (COZAAR) 100 MG tablet Take 1 tablet (100 mg total) by mouth daily.  . metroNIDAZOLE (FLAGYL) 500 MG tablet Take 1 tablet (500 mg total) by mouth 2 (two) times daily.  . Multiple Vitamin (MULTIVITAMIN WITH MINERALS) TABS tablet Take 1 tablet by mouth at bedtime.  . predniSONE (DELTASONE) 1 MG tablet TAKE 2 TABLETS (2 MG TOTAL) BY MOUTH DAILY WITH BREAKFAST. TAKE WITH A 5 MG TABLET FOR A 7 MG DOSE  . predniSONE (DELTASONE) 5 MG tablet TAKE 1 TABLET BY MOUTH EVERY DAY WITH BREAKFAST AND WITH 2 PREDNISONE 1MG  TABLETS  . pyridostigmine (MESTINON) 60 MG tablet Take 1 tablet (60 mg total) by mouth 3 (three) times daily.  Marland Kitchen pyridOXINE (VITAMIN B-6) 100 MG tablet Take 100 mg by mouth at bedtime.  . sodium chloride flush (NS) 0.9 % SOLN Place 10 mLs into feeding tube daily. Into percutaneous/abdominal tube daily.  Marland Kitchen spironolactone (ALDACTONE) 25 MG tablet TAKE 1 TABLET BY MOUTH EVERY DAY  . traMADol (ULTRAM) 50 MG tablet Take 1 tablet (50 mg total) by mouth every 12 (twelve) hours as needed (mild pain). (Patient not taking: Reported on  01/21/2017)  . traZODone (DESYREL) 50 MG tablet TAKE 1 TO 2 TABLETS BY MOUTH EVERY DAY AT BEDTIME AS NEEDED FOR SLEEP  . valsartan (DIOVAN) 320 MG tablet Take 320 mg by mouth daily.      Facility-Administered Encounter Medications as of 01/31/2017  Medication  . 0.9 % sodium chloride infusion   Social History        Socioeconomic History  . Marital status: Married    Spouse name: Not on file  . Number of children: Not on file  . Years of education: Not on file  . Highest education level: Not on file  Social Needs  . Financial resource strain: Not on file  . Food insecurity - worry: Not  on file  . Food insecurity - inability: Not on file  . Transportation needs - medical: Not on file  . Transportation needs - non-medical: Not on file  Occupational History  . 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 Smoking status: Never Smoker  . Smokeless tobacco: Never Used  Substance and Sexual Activity  . Alcohol use: No    Alcohol/week: 0.0 oz  . Drug use: No  . Sexual activity: Not Currently  Other Topics Concern  . Not on file  Social History Narrative   Lives with husband in a one story home. Had one child that only lived for 14 hours.    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          Epworth Sleepiness Scale = 2 (as of 03/14/2015)        Family History  Problem Relation Age of Onset  . Heart attack Mother 62  . Heart failure Mother   . Stroke Paternal Grandmother    in 56s  . Lung cancer Father   . Cancer Father    lung  . Hypertension Sister   . Hyperthyroidism Sister    Graves  . Hyperthyroidism Sister    Graves  . Diabetes Neg Hx   . Colon cancer Neg Hx   . Esophageal cancer Neg Hx   . Stomach cancer Neg Hx   . Rectal cancer Neg Hx    Review of Systems - General ROS: negative for - chills or fever  Allergy and Immunology ROS: negative for - hives  Breast ROS: negative for breast lumps   Respiratory ROS: no cough, shortness of breath, or wheezing  Cardiovascular ROS: no chest pain or dyspnea on exertion  Gastrointestinal ROS: positive for - abdominal pain  negative for - blood in stools  Genito-Urinary ROS: no dysuria, trouble voiding, or hematuria  BP 120/63   Pulse 86   Temp 98.7 F (37.1 C) (Oral)   Resp 16   Ht 5\' 2"  (1.575 m)   Wt 275 lb (124.7 kg)   SpO2 100%   BMI 50.30 kg/m   Physical Exam  General  Mental Status - Alert.  General Appearance - Not in acute distress.  Build & Nutrition - Well nourished.  Posture - Normal posture.  Gait - Normal.  Head and Neck  Head - normocephalic, atraumatic with no lesions or palpable masses.  Trachea - midline.  Chest and Lung Exam  Chest and lung exam reveals - on auscultation, normal breath sounds, no adventitious sounds and normal vocal resonance.  Cardiovascular  Cardiovascular examination reveals - normal heart sounds, regular rate and rhythm with no murmurs and no digital clubbing, cyanosis, edema, increased warmth or tenderness.  Abdomen  Inspection  Inspection of the abdomen reveals - No Hernias.  Palpation/Percussion  Palpation and Percussion of the abdomen reveal - Soft, Non Tender, No Rigidity (guarding), No hepatosplenomegaly and No Palpable abdominal masses.  Neurologic  Neurologic evaluation reveals - alert and oriented x 3 with no impairment of recent or remote memory, normal attention span and ability to concentrate, normal sensation and normal coordination.  Musculoskeletal  Normal Exam - Bilateral - Upper Extremity Strength Normal and Lower Extremity Strength Normal.  Assessment  Colovesical fistula  Plan  72 year old female with myasthenia gravis who has a known colovesicular fistula from diverticular disease. She has a drain within the abscess cavity. She has had multiple abdominal surgeries  over many years. She has a known umbilical hernia. She takes 7 milligrams of prednisone daily. She has  underwent a colonoscopy. She was noted to have a sigmoid stricture. This was unable to be traversed with the colonoscope. Her fistula has failed to resolve with any medical management and therefore I recommend resection. Given her myasthenia gravis gravis, we will plan on doing an open operation as this will limit her time under anesthesia. Even with this, I expect her surgery to take 4-6 hours in total. We will plan on repairing her umbilical hernia at the same time. I will have our office touch base with her neurologist to confirm that she is ready for surgery. We will also have her see the anesthesiologist preoperatively due to her myasthenia gravis gravis.  The surgery and anatomy were described to the patient as well as the risks of surgery and the possible complications. These include: Bleeding, deep abdominal infections and possible wound complications such as hernia and infection, damage to adjacent structures, leak of surgical connections, which can lead to other surgeries and possibly an ostomy, possible need for other procedures, such as abscess drains in radiology, possible prolonged hospital stay, possible diarrhea from removal of part of the colon, possible constipation from narcotics, possible bowel, bladder or sexual dysfunction if having rectal surgery, prolonged fatigue/weakness or appetite loss, possible early recurrence of of disease, possible complications of their medical problems such as heart disease or arrhythmias or lung problems, death (less than 1%). I believe the patient understands and wishes to proceed with the surgery.

## 2017-03-18 NOTE — H&P (View-Only) (Signed)
H&P Physician requesting consult: Leighton Ruff  Chief Complaint: colovesical fistula  History of Present Illness: 72 YO F with colovesical fistula here for open partial colectomy w/ colovesical fistula repair. Intraop stent placement requested  Past Medical History:  Diagnosis Date  . Allergy   . Aortic atherosclerosis ([place])   . Atrial tachycardia ([place]) 05/12/2015  . Bilateral leg edema   . Cataract   . Colovesical fistula   . Diplopia   . Diverticulitis   . Diverticulosis   . DJD (degenerative joint disease), cervical   . Family history of ischemic heart disease   . Frequent UTI   . GERD (gastroesophageal reflux disease)   . Heart murmur   . History of hiatal hernia    Small hernia  . Hypertension   . Internal hemorrhoids 01/21/2017   noted on colonopscopy  . Knee pain, bilateral   . Leukocytoclastic vasculitis ([place])   . Myalgia and myositis, unspecified   . Myasthenia gravis without exacerbation (H[facility])    [facility], Dr Anna Genre  . NSVT (nonsustained ventricular tachycardia) ([place]) 05/12/2015  . Osteoarthritis    Both knees  . Osteopenia   . Other and unspecified hyperlipidemia   . Periumbilical hernia   . Pneumonia 04/2016  . PONV (postoperative nausea and vomiting)   . Psoriasis    plantar ;Dr Jarome Matin  . Shoulder pain, bilateral   . Sleep difficulties   . Unspecified vitamin D deficiency    Dr Estanislado Pandy  . Vasculitis ([place])    Dr Estanislado Pandy  . Vitamin D deficiency    Past Surgical History:  Procedure Laterality Date  . APPENDECTOMY     & exploratory for inflammation; Dr Hassell Done  . CARDIAC CATHETERIZATION N/A 04/17/2015   Procedure: Left Heart Cath and Coronary Angiography;  Surgeon: Leonie Man, MD;  Location: [facility];  Service: Cardiovascular;  Laterality: N/A;  . CERVICAL FUSION     C5-6; Dr Vertell Limber  . CHOLECYSTECTOMY     for stones  . COLONOSCOPY W/ POLYPECTOMY  01/21/2017  . IR RADIOLOGIST EVAL & MGMT  11/16/2016  . IR RADIOLOGIST EVAL & MGMT   12/08/2016  . IR RADIOLOGIST EVAL & MGMT  12/16/2016  . LAMINECTOMY  2010   T1-2; Dr Vertell Limber  . SHOULDER SURGERY     Left & Right  . THORACENTESIS     for peripneumonic effusion  . TOTAL ABDOMINAL HYSTERECTOMY W/ BILATERAL SALPINGOOPHORECTOMY     For Fibroids & endometriosis    Home Medications:  Facility-Administered Medications Prior to Admission  Medication Dose Route Frequency Provider Last Rate Last Dose  . 0.9 %  sodium chloride infusion  500 mL Intravenous Continuous Armbruster, Carlota Raspberry, MD       Medications Prior to Admission  Medication Sig Dispense Refill Last Dose  . acetaminophen (TYLENOL) 500 MG tablet Take 1,000 mg by mouth at bedtime as needed for moderate pain.     Marland Kitchen aspirin EC 81 MG tablet Take 81 mg by mouth at bedtime.   03/17/2017 at Unknown time  . augmented betamethasone dipropionate (DIPROLENE-AF) 0.05 % ointment Apply 1 application topically daily. For vasculitis and psoriasis   Taking  . CALCIUM PO Take 1,000 mg by mouth at bedtime.   03/17/2017 at Unknown time  . cholecalciferol (VITAMIN D) 1000 units tablet Take 1,000 Units by mouth at bedtime.   03/17/2017 at Unknown time  . folic acid (FOLVITE) 1 MG tablet TAKE 2 TABLETS BY MOUTH ONCE DAILY (Patient taking differently: TAKE 2  TABLETS BY MOUTH ONCE DAILY AT NIGHT) 180 tablet 4 03/17/2017 at Unknown time  . furosemide (LASIX) 40 MG tablet Take 1 tablet (40 mg total) by mouth daily. 90 tablet 1 03/17/2017 at Unknown time  . losartan (COZAAR) 100 MG tablet Take 1 tablet (100 mg total) by mouth daily. 90 tablet 3 03/17/2017 at Unknown time  . Multiple Vitamin (MULTIVITAMIN WITH MINERALS) TABS tablet Take 1 tablet by mouth at bedtime.   03/17/2017 at Unknown time  . predniSONE (DELTASONE) 5 MG tablet TAKE 1 TABLET BY MOUTH EVERY DAY WITH BREAKFAST AND WITH 2 PREDNISONE 1MG  TABLETS (Patient taking differently: TAKE 1 TABLET BY MOUTH EVERY DAY WITH BREAKFAST) 90 tablet 0 03/18/2017 at 0400  . pyridostigmine (MESTINON) 60 MG tablet  Take 1 tablet (60 mg total) by mouth 3 (three) times daily. 270 tablet 3 03/18/2017 at 0400  . pyridOXINE (VITAMIN B-6) 100 MG tablet Take 100 mg by mouth at bedtime.   03/17/2017 at Unknown time  . spironolactone (ALDACTONE) 25 MG tablet TAKE 1 TABLET BY MOUTH EVERY DAY 90 tablet 3 03/17/2017 at Unknown time  . traZODone (DESYREL) 50 MG tablet TAKE 1 TO 2 TABLETS BY MOUTH EVERY DAY AT BEDTIME AS NEEDED FOR SLEEP (Patient taking differently: Take 50 mgs by mouth at bedtime may take an additional 50 mg later if still awake) 30 tablet 1   . vitamin B-12 (CYANOCOBALAMIN) 1000 MCG tablet Take 1,000 mcg by mouth at bedtime.   03/17/2017 at Unknown time  . acetaminophen (TYLENOL) 325 MG tablet Take 2 tablets (650 mg total) by mouth every 6 (six) hours as needed for mild pain (or temp > 100). (Patient not taking: Reported on 03/02/2017)   Not Taking at Unknown time  . LORazepam (ATIVAN) 0.5 MG tablet Take 1 tablet (0.5 mg total) by mouth at bedtime. (Patient not taking: Reported on 02/09/2017) 30 tablet 1 Not Taking at Unknown time  . metroNIDAZOLE (FLAGYL) 500 MG tablet Take 1 tablet (500 mg total) by mouth 2 (two) times daily. (Patient not taking: Reported on 03/02/2017) 28 tablet 0 Completed Course at Unknown time  . predniSONE (DELTASONE) 1 MG tablet TAKE 2 TABLETS (2 MG TOTAL) BY MOUTH DAILY WITH BREAKFAST. TAKE WITH A 5 MG TABLET FOR A 7 MG DOSE (Patient not taking: Reported on 02/09/2017) 180 tablet 1 Not Taking at Unknown time  . sodium chloride flush (NS) 0.9 % SOLN Place 10 mLs into feeding tube daily. Into percutaneous/abdominal tube daily. 30 Syringe 0 Taking  . traMADol (ULTRAM) 50 MG tablet Take 1 tablet (50 mg total) by mouth every 12 (twelve) hours as needed (mild pain). (Patient not taking: Reported on 01/21/2017) 10 tablet 0 Completed Course at Unknown time   Allergies:  Allergies  Allergen Reactions  . Azithromycin Other (See Comments)    Angioedema  . Ibuprofen     800 mg - edema all over,  including lips and tongue  . Levaquin [Levofloxacin In D5w] Other (See Comments)    D/T MYASTHENIA GRAVIS  . Tetracycline Rash  . Isovue [Iopamidol] Dermatitis    Pt had a rash and blisters on bilat feet and lower part of legs.  Full premeds in the future.  This happened twice after IV contrast.   J Bohm  RTRCT  . Sulfonamide Derivatives     Seizure age 18 (Note: probably fever induced)  . Aleve [Naproxen] Other (See Comments)    Caused excessive salivation, throat tightness  . Tape Other (See Comments)  Causes blisters  Paper tape only  . Zosyn [Piperacillin Sod-Tazobactam So]     Severe leg rash, skin peeling  . Amlodipine Besy-Benazepril Hcl Cough  . Amoxicillin Swelling and Rash    Has patient had a PCN reaction causing immediate rash, facial/tongue/throat swelling, SOB or lightheadedness with hypotension: No Has patient had a PCN reaction causing severe rash involving mucus membranes or skin necrosis: No Has patient had a PCN reaction that required hospitalization: No Has patient had a PCN reaction occurring within the last 10 years: Yes If all of the above answers are "NO", then may proceed with Cephalosporin use.     Family History  Problem Relation Age of Onset  . Heart attack Mother 40  . Heart failure Mother   . Stroke Paternal Grandmother        in 106s  . Lung cancer Father   . Cancer Father        lung  . Hypertension Sister   . Hyperthyroidism Sister        Graves  . Hyperthyroidism Sister        Graves  . Diabetes Neg Hx   . Colon cancer Neg Hx   . Esophageal cancer Neg Hx   . Stomach cancer Neg Hx   . Rectal cancer Neg Hx    Social History:  reports that  has never smoked. she has never used smokeless tobacco. She reports that she does not drink alcohol or use drugs.  ROS: A complete review of systems was performed.  All systems are negative except for pertinent findings as noted. ROS   Physical Exam:  Vital signs in last 24 hours: Temp:  [98.7 F  (37.1 C)] 98.7 F (37.1 C) (03/08 0540) Pulse Rate:  [86] 86 (03/08 0540) Resp:  [16] 16 (03/08 0540) BP: (120)/(63) 120/63 (03/08 0540) SpO2:  [100 %] 100 % (03/08 0540) Weight:  [124.7 kg (275 lb)] 124.7 kg (275 lb) (03/08 0547) General:  Alert and oriented, No acute distress HEENT: Normocephalic, atraumatic Neck: No JVD or lymphadenopathy Cardiovascular: Regular rate and rhythm Lungs: Regular rate and effort Abdomen: Soft, nontender, nondistended, no abdominal masses Back: No CVA tenderness Extremities: No edema Neurologic: Grossly intact  Laboratory Data:  No results found for this or any previous visit (from the past 24 hour(s)). No results found for this or any previous visit (from the past 240 hour(s)). Creatinine: No results for input(s): CREATININE in the last 168 hours.  Impression/Assessment:  colovesical fistula  Plan:  Proceed with b/l ureteral stent placement  [name], [name] 03/18/2017, 7:36 AM

## 2017-03-18 NOTE — Progress Notes (Signed)
Dr Marcello Moores aware UOP at 15cc's per hr. No orders received at this time.

## 2017-03-18 NOTE — Anesthesia Procedure Notes (Signed)
Procedure Name: Intubation Date/Time: 03/18/2017 7:45 AM Performed by: Lind Covert, CRNA Pre-anesthesia Checklist: Patient identified, Emergency Drugs available, Suction available, Patient being monitored and Timeout performed Patient Re-evaluated:Patient Re-evaluated prior to induction Oxygen Delivery Method: Circle system utilized Preoxygenation: Pre-oxygenation with 100% oxygen Induction Type: IV induction Ventilation: Mask ventilation without difficulty Laryngoscope Size: Mac, 3 and Glidescope (glidescope chosen due  to hx cervical fusion) Grade View: Grade I Tube type: Oral Tube size: 7.0 mm Number of attempts: 1 Airway Equipment and Method: Stylet Placement Confirmation: ETT inserted through vocal cords under direct vision,  positive ETCO2 and breath sounds checked- equal and bilateral Secured at: 22 cm Tube secured with: Tape Dental Injury: Teeth and Oropharynx as per pre-operative assessment

## 2017-03-18 NOTE — Discharge Instructions (Addendum)
ABDOMINAL SURGERY: POST OP INSTRUCTIONS ° °1. DIET: Follow a light bland diet the first 24 hours after arrival home, such as soup, liquids, crackers, etc.  Be sure to include lots of fluids daily.  Avoid fast food or heavy meals as your are more likely to get nauseated.  Do not eat any uncooked fruits or vegetables for the next 2 weeks as your colon heals. °2. Take your usually prescribed home medications unless otherwise directed. °3. PAIN CONTROL: °a. Pain is best controlled by a usual combination of three different methods TOGETHER: °i. Ice/Heat °ii. Over the counter pain medication °iii. Prescription pain medication °b. Most patients will experience some swelling and bruising around the incisions.  Ice packs or heating pads (30-60 minutes up to 6 times a day) will help. Use ice for the first few days to help decrease swelling and bruising, then switch to heat to help relax tight/sore spots and speed recovery.  Some people prefer to use ice alone, heat alone, alternating between ice & heat.  Experiment to what works for you.  Swelling and bruising can take several weeks to resolve.   °c. It is helpful to take an over-the-counter pain medication regularly for the first few weeks.  Choose one of the following that works best for you: °i. Naproxen (Aleve, etc)  Two 220mg tabs twice a day °ii. Ibuprofen (Advil, etc) Three 200mg tabs four times a day (every meal & bedtime) °iii. Acetaminophen (Tylenol, etc) 500-650mg four times a day (every meal & bedtime) °d. A  prescription for pain medication (such as oxycodone, hydrocodone, etc) should be given to you upon discharge.  Take your pain medication as prescribed.  °i. If you are having problems/concerns with the prescription medicine (does not control pain, nausea, vomiting, rash, itching, etc), please call us [phone] to see if we need to switch you to a different pain medicine that will work better for you and/or control your side effect better. °ii. If you  need a refill on your pain medication, please contact your pharmacy.  They will contact our office to request authorization. Prescriptions will not be filled after 5 pm or on week-ends. °4. Avoid getting constipated.  Between the surgery and the pain medications, it is common to experience some constipation.  Increasing fluid intake and taking a fiber supplement (such as Metamucil, Citrucel, FiberCon, MiraLax, etc) 1-2 times a day regularly will usually help prevent this problem from occurring.  A mild laxative (prune juice, Milk of Magnesia, MiraLax, etc) should be taken according to package directions if there are no bowel movements after 48 hours.   °5. Watch out for diarrhea.  If you have many loose bowel movements, simplify your diet to bland foods & liquids for a few days.  Stop any stool softeners and decrease your fiber supplement.  Switching to mild anti-diarrheal medications (Kayopectate, Pepto Bismol) can help.  If this worsens or does not improve, please call us. °6. Wash / shower every day.  You may shower over the incision / wound.  Avoid baths until the skin is fully healed.  Continue to shower over incision(s) after the dressing is off. °7. Remove your waterproof bandages 5 days after surgery.  You may leave the incision open to air.  You may replace a dressing/Band-Aid to cover the incision for comfort if you wish. °8. ACTIVITIES as tolerated:   °a. You may resume regular (light) daily activities beginning the next day--such as daily self-care, walking, climbing stairs--gradually increasing activities as   tolerated.  If you can walk 30 minutes without difficulty, it is safe to try more intense activity such as jogging, treadmill, bicycling, low-impact aerobics, swimming, etc. b. Save the most intensive and strenuous activity for last such as sit-ups, heavy lifting, contact sports, etc  Refrain from any heavy lifting or straining until you are off narcotics for pain control.   c. DO NOT PUSH THROUGH  PAIN.  Let pain be your guide: If it hurts to do something, don't do it.  Pain is your body warning you to avoid that activity for another week until the pain goes down. d. You may drive when you are no longer taking prescription pain medication, you can comfortably wear a seatbelt, and you can safely maneuver your car and apply brakes. e. Dennis Bast may have sexual intercourse when it is comfortable.  9. FOLLOW UP in our office a. Please call [facility] at [phone] to set up an appointment to see your surgeon in the office for a follow-up appointment approximately 1-2 weeks after your surgery. b. Make sure that you call for this appointment the day you arrive home to insure a convenient appointment time. 10. IF YOU HAVE DISABILITY OR FAMILY LEAVE FORMS, BRING THEM TO THE OFFICE FOR PROCESSING.  DO NOT GIVE THEM TO YOUR DOCTOR.   WHEN TO CALL us [phone]: 1. Poor pain control 2. Reactions / problems with new medications (rash/itching, nausea, etc)  3. Fever over 101.5 F (38.5 C) 4. Inability to urinate 5. Nausea and/or vomiting 6. Worsening swelling or bruising 7. Continued bleeding from incision. 8. Increased pain, redness, or drainage from the incision  The clinic staff is available to answer your questions during regular business hours (8:30am-5pm).  Please dont hesitate to call and ask to speak to one of our nurses for clinical concerns.   A surgeon from [facility] is always on call at the hospitals   If you have a medical emergency, go to the nearest emergency room or call 911.    [facility], [place], [place], [place], [place]  [address] ? MAIN: [phone] ? TOLL FREE: [phone] ? FAX [phone] [email or web address]   Information on my medicine - ELIQUIS (apixaban)  This medication education was reviewed with me or my healthcare representative as part of my discharge preparation.  The pharmacist that spoke with  me during my hospital stay was:  Biagio Borg, [facility]  Why was Eliquis prescribed for you? Eliquis was prescribed for you to reduce the risk of a blood clot forming that can cause a stroke if you have a medical condition called atrial fibrillation (a type of irregular heartbeat).  What do You need to know about Eliquis ? Take your Eliquis TWICE DAILY - one tablet in the morning and one tablet in the evening with or without food. If you have difficulty swallowing the tablet whole please discuss with your pharmacist how to take the medication safely.  Take Eliquis exactly as prescribed by your doctor and DO NOT stop taking Eliquis without talking to the doctor who prescribed the medication.  Stopping may increase your risk of developing a stroke.  Refill your prescription before you run out.  After discharge, you should have regular check-up appointments with your healthcare provider that is prescribing your Eliquis.  In the future your dose may need to be changed if your kidney function or weight changes by a significant amount or as you get older.  What do  you do if you miss a dose? If you miss a dose, take it as soon as you remember on the same day and resume taking twice daily.  Do not take more than one dose of ELIQUIS at the same time to make up a missed dose.  Important Safety Information A possible side effect of Eliquis is bleeding. You should call your healthcare provider right away if you experience any of the following: ? Bleeding from an injury or your nose that does not stop. ? Unusual colored urine (red or dark brown) or unusual colored stools (red or black). ? Unusual bruising for unknown reasons. ? A serious fall or if you hit your head (even if there is no bleeding).  Some medicines may interact with Eliquis and might increase your risk of bleeding or clotting while on Eliquis. To help avoid this, consult your healthcare provider or pharmacist prior to using  any new prescription or non-prescription medications, including herbals, vitamins, non-steroidal anti-inflammatory drugs (NSAIDs) and supplements.  This website has more information on Eliquis (apixaban): http://www.eliquis.com/eliquis/home

## 2017-03-18 NOTE — Progress Notes (Signed)
Foley catheter irrigated with 40 CC's NS without difficulty. Pt tolerated well. WIll monitor UOP

## 2017-03-19 ENCOUNTER — Encounter (HOSPITAL_COMMUNITY): Payer: Self-pay | Admitting: General Surgery

## 2017-03-19 LAB — BASIC METABOLIC PANEL
Anion gap: 9 (ref 5–15)
BUN: 17 mg/dL (ref 6–20)
CALCIUM: 8 mg/dL — AB (ref 8.9–10.3)
CO2: 25 mmol/L (ref 22–32)
CREATININE: 2.06 mg/dL — AB (ref 0.44–1.00)
Chloride: 101 mmol/L (ref 101–111)
GFR calc non Af Amer: 23 mL/min — ABNORMAL LOW (ref >60)
GFR, EST AFRICAN AMERICAN: 27 mL/min — AB (ref >60)
GLUCOSE: 161 mg/dL — AB (ref 65–99)
Potassium: 4.1 mmol/L (ref 3.5–5.1)
Sodium: 135 mmol/L (ref 135–145)

## 2017-03-19 LAB — CBC
HCT: 25.6 % — ABNORMAL LOW (ref 36.0–46.0)
HEMATOCRIT: 23.3 % — AB (ref 36.0–46.0)
HEMOGLOBIN: 7.3 g/dL — AB (ref 12.0–15.0)
Hemoglobin: 8.2 g/dL — ABNORMAL LOW (ref 12.0–15.0)
MCH: 28 pg (ref 26.0–34.0)
MCH: 27.7 pg (ref 26.0–34.0)
MCHC: 32 g/dL (ref 30.0–36.0)
MCHC: 31.3 g/dL (ref 30.0–36.0)
MCV: 87.4 fL (ref 78.0–100.0)
MCV: 88.3 fL (ref 78.0–100.0)
PLATELETS: 358 10*3/uL (ref 150–400)
Platelets: 318 10*3/uL (ref 150–400)
RBC: 2.93 MIL/uL — AB (ref 3.87–5.11)
RBC: 2.64 MIL/uL — ABNORMAL LOW (ref 3.87–5.11)
RDW: 15.1 % (ref 11.5–15.5)
RDW: 15.4 % (ref 11.5–15.5)
WBC: 23.8 10*3/uL — ABNORMAL HIGH (ref 4.0–10.5)
WBC: 17.1 10*3/uL — ABNORMAL HIGH (ref 4.0–10.5)

## 2017-03-19 LAB — GLUCOSE, CAPILLARY
GLUCOSE-CAPILLARY: 137 mg/dL — AB (ref 65–99)
Glucose-Capillary: 107 mg/dL — ABNORMAL HIGH (ref 65–99)
Glucose-Capillary: 107 mg/dL — ABNORMAL HIGH (ref 65–99)

## 2017-03-19 LAB — TROPONIN I

## 2017-03-19 MED ORDER — PROMETHAZINE HCL 25 MG/ML IJ SOLN
12.5000 mg | Freq: Four times a day (QID) | INTRAMUSCULAR | Status: DC | PRN
  Administered 2017-03-19 (×2): 12.5 mg via INTRAVENOUS
  Filled 2017-03-19 (×3): qty 1

## 2017-03-19 MED ORDER — SODIUM CHLORIDE 0.9 % IV BOLUS (SEPSIS): 500.0000 mL | Freq: Once | INTRAVENOUS | Status: DC

## 2017-03-19 MED ORDER — ACETAMINOPHEN 10 MG/ML IV SOLN
1000.0000 mg | Freq: Four times a day (QID) | INTRAVENOUS | Status: AC
  Administered 2017-03-19 – 2017-03-20 (×4): 1000 mg via INTRAVENOUS
  Filled 2017-03-19 (×4): qty 100

## 2017-03-19 MED ORDER — SODIUM CHLORIDE 0.9 % IV BOLUS (SEPSIS)
500.0000 mL | Freq: Once | INTRAVENOUS | Status: AC
  Administered 2017-03-19: 500 mL via INTRAVENOUS

## 2017-03-19 NOTE — Progress Notes (Signed)
Nursing Note: called by primary RN on Med Surg Unit to insert peripheral IV. Attempted x 2 with "AccuVein" finder device. Identified to potential sites in left arm. Unsuccessful times 2 attempt. On 2nd attempt, sm amt of blood return noted in 22g cath, but unable to thread catheter into vein. Primary RN notified of attempts and unable to establish a patent IV site.

## 2017-03-19 NOTE — Progress Notes (Signed)
Paged Dr Marlou Starks about patients BP 90/35, pulse 101. Pt is also flushed in the face.  Received orders for 500cc bolus, CBC, EKG, Cardiac enzymes, and a type and cross

## 2017-03-19 NOTE — Progress Notes (Signed)
Called Dr. Marlou Starks concerning thoughts on getting a PICC line for the patient.  She reports having to be stuck four times yesterday before a successful iv was placed.  It came out today and 2 attempts were made for placement, after that,  IV team was called to come and try to place an IV. Pt is having an emotional day, getting teary eyed at times, feeling overwhelmed by the surgery.  Will continue to monitor.

## 2017-03-19 NOTE — Progress Notes (Signed)
Occupational Therapy Evaluation Patient Details Name: Kimberly [name] MRN: [id number] DOB: 1945/12/26 Today's Date: 03/19/2017    History of Present Illness 72 year old female with myasthenia gravis who has a known colovesicular fistula from diverticular disease.She has had multiple abdominal surgeries over many years. Diagnosed with myasthenia gravis in 2011. 3/8 underwent open sigmoidectomy, open hernia repair and cystoscopy wtih retrograde and B urithral stent placement.  Wound vac curently in place. Significant PMH: arthritis, R frozen shoulder; L partial frozen shoulder (per pt); vasculitis.   Clinical Impression   PTA, pt ambulating @ RW level and husband assisted as needed with ADL. Pt worked 2 days/wk. Evaluation limited today by fatigue  - pt pleasantly declined mobility at this time due to not feeling well. Pt agreeable to get OOB/mobility tomorrow. Pt has been ambulating to bathroom with nursing staff. Pt expressing her frustration with her "situation" and states " I just want to be able to do more". At this time, recommend follow up with [facility] Will follow acutely to maximize functional level of independence to facilitate safe DC home with supportive husband.  Note pooling of fluid @ umbilicus under wound vac film dressing - nursing alerted.    Follow Up Recommendations  Home health OT;Supervision/Assistance - 24 hour ; [place] Aide   Equipment Recommendations  Tub/shower bench(may need hospital bed)    Recommendations for Other Services       Precautions / Restrictions Restrictions Other Position/Activity Restrictions: wound vac      Mobility Bed Mobility Overal bed mobility: Needs Assistance Bed Mobility: Rolling Rolling: Min assist         General bed mobility comments: heavy use of rails rolling side to side. Pt states it is difficult to sit up. May need hospital bed  Transfers                 General transfer comment: pt ceclined    Balance                                            ADL either performed or assessed with clinical judgement   ADL Overall ADL's : Needs assistance/impaired   Eating/Feeding Details (indicate cue type and reason): ice chips Grooming: Minimal assistance;Sitting   Upper Body Bathing: Moderate assistance;Sitting   Lower Body Bathing: Maximal assistance;Bed level   Upper Body Dressing : Moderate assistance;Sitting   Lower Body Dressing: Maximal assistance;Bed level     Toilet Transfer Details (indicate cue type and reason): per nursing has been walking to bathroom with +1 assist           General ADL Comments: Pt declined mobility due to not feeling well;Pt would most likely benefit form AE to help with ADL tasks; would also benefitform use of tub bench; states she does not go out into the community becuase she is "vain" and doens't liek walking with a walker". Briefly discussed possibly using a rollator or w/c inorder to increase her participation within the community     Vision         Perception     Praxis      Pertinent Vitals/Pain Pain Assessment: Faces Faces Pain Scale: Hurts even more Pain Location: abdomen Pain Descriptors / Indicators: Cramping;Discomfort Pain Intervention(s): Limited activity within patient's tolerance;Repositioned;Ice applied     Hand Dominance Right   Extremity/Trunk Assessment Upper Extremity Assessment Upper Extremity Assessment: RUE deficits/detail;LUE deficits/detail  RUE Deficits / Details: frozen shoulder; @ 20 defrees FF; elbow/wrist/hand WFL LUE Deficits / Details: greater ROM L shoulder to @ 60FF   Lower Extremity Assessment Lower Extremity Assessment: Defer to PT evaluation(B knee OA per pt; painful)   Cervical / Trunk Assessment Cervical / Trunk Assessment: Other exceptions Cervical / Trunk Exceptions: hx of back problems   Communication Communication Communication: No difficulties   Cognition Arousal/Alertness:  Awake/alert Behavior During Therapy: WFL for tasks assessed/performed Overall Cognitive Status: Within Functional Limits for tasks assessed                                 General Comments: Pt with word finding difficulties at times most likely related to meds   General Comments       Exercises Exercises: Other exercises Other Exercises Other Exercises: encouraged bed level ex astolerated Other Exercises: pillow placed under L buttocks; encouraged pt to rotate pillow q 2 hours /PRN   Shoulder Instructions      Home Living Family/patient expects to be discharged to:: Private residence Living Arrangements: Spouse/significant other Available Help at Discharge: Family;Available 24 hours/day Type of Home: House Home Access: Ramped entrance     Home Layout: One level(has basement)     Bathroom Shower/Tub: Tub/shower unit;Curtain   Bathroom Toilet: Standard Bathroom Accessibility: Yes How Accessible: Accessible via wheelchair;Accessible via walker Home Equipment: [place] - 2 wheels;Walker - 4 wheels;Cane - single point;Bedside commode;Shower seat;Grab bars - toilet          Prior Functioning/Environment Level of Independence: Needs assistance  Gait / Transfers Assistance Needed: using RW or furniture walkingin the house ADL's / Homemaking Assistance Needed: husband assisted with bating legs/back and assistedinto tub(limited ROM B shoulders - husband assists with UB ADL PRN)   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        OT Problem List: Decreased strength;Decreased range of motion;Decreased activity tolerance;Impaired balance (sitting and/or standing);Decreased safety awareness;Decreased knowledge of use of DME or AE;Decreased knowledge of precautions;Obesity;Pain;Impaired UE functional use;Increased edema      OT Treatment/Interventions: Self-care/ADL training;Therapeutic exercise;DME and/or AE instruction;Therapeutic  activities;Patient/family education    OT Goals(Current goals can be found in the care plan section) Acute Rehab OT Goals Patient Stated Goal: to get better OT Goal Formulation: With patient Time For Goal Achievement: 04/02/17 Potential to Achieve Goals: Good  OT Frequency: Min 3X/week   Barriers to D/C:            Co-evaluation              AM-PAC PT "6 Clicks" Daily Activity     Outcome Measure Help from another person eating meals?: A Little Help from another person taking care of personal grooming?: A Little Help from another person toileting, which includes using toliet, bedpan, or urinal?: A Lot Help from another person bathing (including washing, rinsing, drying)?: A Lot Help from another person to put on and taking off regular upper body clothing?: A Lot Help from another person to put on and taking off regular lower body clothing?: A Lot 6 Click Score: 14   End of Session Nurse Communication: Mobility status  Activity Tolerance: Patient limited by pain;Patient limited by fatigue Patient left: in bed;with call bell/phone within reach;with family/visitor present  OT Visit Diagnosis: Unsteadiness on feet (R26.81);Muscle weakness (generalized) (M62.81);Pain Pain - part of body: (abdomen)  Time: 1325-1350 OT Time Calculation (min): 25 min Charges:  OT General Charges $OT Visit: 1 Visit OT Evaluation $OT Eval Moderate Complexity: 1 Mod OT Treatments $Self Care/Home Management : 8-22 mins G-Codes:     [facility], OT/L  [phone] 03/19/2017  Tynleigh Birt,H[place] 03/19/2017, 2:08 PM

## 2017-03-19 NOTE — Progress Notes (Signed)
IV nurse was able to get an IV started. Pt would rather avoid a PICC line if possible.

## 2017-03-19 NOTE — Progress Notes (Signed)
IV team called about placing PICC and I reported that pt wanted to avoid it for now if possible so they ask me to take the order out for now.

## 2017-03-19 NOTE — Progress Notes (Signed)
Called Dr. Marlou Starks regarding BP of 96/45, pulse 73.  Order was given for a fluid bolus.   Will continue to monitor.

## 2017-03-19 NOTE — Progress Notes (Signed)
MD Marcello Moores was notified that patient's BP was 108/40 and that her urine output has been 124mL in the last three hours. Md Marcello Moores ordered to increase patient's fluids to 137ml/hr.

## 2017-03-19 NOTE — Progress Notes (Signed)
1 Day Post-Op open sigmoidectomy Subjective: Low UOP all day yesterday but I was not notified for 11 hrs.  Pt had good response to IV fluids.  UOP better overnight.  Pt with nausea and vomiting as well.     Objective: Vital signs in last 24 hours: Temp:  [96.5 F (35.8 C)-98.7 F (37.1 C)] 97.6 F (36.4 C) (03/09 0550) Pulse Rate:  [70-89] 81 (03/09 0550) Resp:  [10-18] 17 (03/09 0550) BP: (92-168)/(40-86) 132/47 (03/09 0550) SpO2:  [94 %-100 %] 94 % (03/09 0550) Weight:  [270 lb 15.1 oz (122.9 kg)] 270 lb 15.1 oz (122.9 kg) (03/09 0550)   Intake/Output from previous day: 03/08 0701 - 03/09 0700 In: 3691.3 [I.V.:3341.3; IV Piggyback:350] Out: 800 [Urine:400; Blood:400] Intake/Output this shift: No intake/output data recorded.   General appearance: alert and cooperative GI: normal findings: soft, appropriately tender  Incision: Prevena vac in place  Lab Results:  Recent Labs    03/19/17 0428  WBC 23.8*  HGB 8.2*  HCT 25.6*  PLT 358   BMET Recent Labs    03/19/17 0428  NA 135  K 4.1  CL 101  CO2 25  GLUCOSE 161*  BUN 17  CREATININE 2.06*  CALCIUM 8.0*   PT/INR No results for input(s): LABPROT, INR in the last 72 hours. ABG No results for input(s): PHART, HCO3 in the last 72 hours.  Invalid input(s): PCO2, PO2  MEDS, Scheduled . acetaminophen  1,000 mg Oral Q6H  . alvimopan  12 mg Oral BID  . enoxaparin (LOVENOX) injection  40 mg Subcutaneous Q24H  . feeding supplement  237 mL Oral BID BM  . [START ON 03/20/2017] furosemide  40 mg Oral Daily  . losartan  100 mg Oral Daily  . predniSONE  5 mg Oral Q breakfast  . pyridostigmine  60 mg Oral TID  . saccharomyces boulardii  250 mg Oral BID  . spironolactone  25 mg Oral Daily    Studies/Results: Dg C-arm 1-60 Min-no Report  Result Date: 03/18/2017 Fluoroscopy was utilized by the requesting physician.  No radiographic interpretation.    Assessment: s/p Procedure(s): OPEN SIGMOIDECTOMY OPEN HERNIA  REPAIR UMBILICAL ADULT CYSTOSCOPY WITH RETROGRADE AND BILATERAL URETERAL STENT PLACEMENT Patient Active Problem List   Diagnosis Date Noted  . Diverticular stricture ([place]) 03/18/2017  . Skin erythema 11/01/2016  . Sleep difficulties 10/31/2016  . Pelvic abscess in female 10/11/2016  . Bilateral leg edema 08/20/2016  . Recurrent UTI 08/15/2016  . Psoriasis 04/13/2016  . History of diverticulitis 04/13/2016  . Osteopenia of multiple sites 04/13/2016  . DJD (degenerative joint disease), cervical 04/07/2016  . Primary osteoarthritis of both knees 04/07/2016  . Decreased abduction of right shoulder joint 04/07/2016  . Atrial tachycardia ([place]) 05/12/2015  . NSVT (nonsustained ventricular tachycardia) ([place]) 05/12/2015  . Abnormal nuclear stress test - INTERMEDIATE RISK 04/14/2015  . Coronary artery calcification seen on CAT scan 02/28/2015  . Left lower quadrant pain 02/17/2015  . Hyperglycemia 08/09/2014  . Dyspnea 03/20/2013  . Hypokalemia 12/31/2012  . Umbilical hernia 85/27/7824  . Pleural effusion 05/10/2012  . Myasthenia gravis without exacerbation ([place]) 02/12/2010  . DIPLOPIA 11/21/2009  . Vitamin D deficiency 06/25/2008  . Hyperlipidemia 06/25/2008  . Essential hypertension 06/25/2008  . Leukocytoclastic vasculitis ([place]) 06/19/2007  . MUSCLE WEAKNESS (GENERALIZED) 02/10/2007  . Unspecified Myalgia and Myositis 01/24/2007  . EDEMA- LOCALIZED 01/24/2007  . ANA POSITIVE, HX OF 01/24/2007    Expected post op course  Plan: Acute blood loss anemia: Hgb down  to 8.  HD stable.  Will recheck in AM and cont IVF's today Acute Renal Insufficiency: low UOP yesterday most likely due to dehydration and acute blood loss anemia.  Expect Cr to improve with appropriate fluid resuscitation  Cont NPO with sips today OOB and ambulating in hall    LOS: 1 day     .Rosario Adie, [facility], [place]   03/19/2017 8:30 AM

## 2017-03-20 LAB — BASIC METABOLIC PANEL
ANION GAP: 8 (ref 5–15)
BUN: 14 mg/dL (ref 6–20)
CALCIUM: 7.6 mg/dL — AB (ref 8.9–10.3)
CO2: 23 mmol/L (ref 22–32)
Chloride: 106 mmol/L (ref 101–111)
Creatinine, Ser: 1.18 mg/dL — ABNORMAL HIGH (ref 0.44–1.00)
GFR, EST AFRICAN AMERICAN: 53 mL/min — AB (ref >60)
GFR, EST NON AFRICAN AMERICAN: 46 mL/min — AB (ref >60)
Glucose, Bld: 90 mg/dL (ref 65–99)
Potassium: 4 mmol/L (ref 3.5–5.1)
SODIUM: 137 mmol/L (ref 135–145)

## 2017-03-20 LAB — CBC
HCT: 24.2 % — ABNORMAL LOW (ref 36.0–46.0)
HEMOGLOBIN: 7.7 g/dL — AB (ref 12.0–15.0)
MCH: 27.8 pg (ref 26.0–34.0)
MCHC: 31.8 g/dL (ref 30.0–36.0)
MCV: 87.4 fL (ref 78.0–100.0)
Platelets: 343 10*3/uL (ref 150–400)
RBC: 2.77 MIL/uL — AB (ref 3.87–5.11)
RDW: 15.3 % (ref 11.5–15.5)
WBC: 15.6 10*3/uL — AB (ref 4.0–10.5)

## 2017-03-20 LAB — CK TOTAL AND CKMB (NOT AT ARMC)
CK TOTAL: 270 U/L — AB (ref 38–234)
CK, MB: 19.5 ng/mL — AB (ref 0.5–5.0)
RELATIVE INDEX: 7.2 — AB (ref 0.0–2.5)

## 2017-03-20 MED ORDER — METHOCARBAMOL 500 MG PO TABS
500.0000 mg | ORAL_TABLET | Freq: Four times a day (QID) | ORAL | Status: DC | PRN
  Administered 2017-03-20 – 2017-03-21 (×2): 500 mg via ORAL
  Filled 2017-03-20 (×2): qty 1

## 2017-03-20 MED ORDER — HYDROMORPHONE HCL 1 MG/ML IJ SOLN
0.5000 mg | INTRAMUSCULAR | Status: DC | PRN
  Administered 2017-03-20: 1 mg via INTRAVENOUS
  Filled 2017-03-20: qty 1

## 2017-03-20 NOTE — Evaluation (Signed)
Physical Therapy Evaluation Patient Details Name: JAMESYN MOOREFIELD MRN: [id number] DOB: December 30, 1945 Today's Date: 03/20/2017   History of Present Illness  72 year old female with myasthenia gravis who has a known colovesicular fistula from diverticular disease.She has had multiple abdominal surgeries over many years. Diagnosed with myasthenia gravis in 2011. 3/8 underwent open sigmoidectomy, open hernia repair and cystoscopy wtih retrograde and B urithral stent placement.  Wound vac curently in place. Significant PMH: arthritis, R frozen shoulder; L partial frozen shoulder (per pt); vasculitis.  Clinical Impression  Pt admitted with above diagnosis. Pt currently with functional limitations due to the deficits listed below (see PT Problem List). Pt will benefit from skilled PT to increase their independence and safety with mobility to allow discharge to home with family support. When pain controlled pt likely will be close to baseline level of function for gait.      Follow Up Recommendations Home health PT;Supervision for mobility/OOB    Equipment Recommendations  None recommended by PT    Recommendations for Other Services       Precautions / Restrictions Precautions Precautions: Fall Restrictions Weight Bearing Restrictions: No Other Position/Activity Restrictions: abdominal wound vac      Mobility  Bed Mobility Overal bed mobility: Needs Assistance Bed Mobility: Supine to Sit     Supine to sit: Mod assist     General bed mobility comments: shoulder issues make bed mobility more difficult  Transfers Overall transfer level: Needs assistance Equipment used: Rolling walker (2 wheeled)(wide RW) Transfers: Sit to/from Stand Sit to Stand: Min assist            Ambulation/Gait Ambulation/Gait assistance: Min guard;+2 safety/equipment Ambulation Distance (Feet): 10 Feet(also walked 8 ft to bathroom) Assistive device: Rolling walker (2 wheeled)(wide RW) Gait  Pattern/deviations: Step-to pattern;Wide base of support;Decreased stride length     General Gait Details: gait limited by complaints of back pain when standing. Could not ambulate further. Pt ambulated to bathroom to used commode then ambulated to recliner chair.  Stairs            Wheelchair Mobility    Modified Rankin (Stroke Patients Only)       Balance                                             Pertinent Vitals/Pain Pain Assessment: 0-10 Pain Score: 7  Pain Location: low back Pain Descriptors / Indicators: Grimacing;Moaning Pain Intervention(s): Limited activity within patient's tolerance;Monitored during session    Home Living Family/patient expects to be discharged to:: Private residence Living Arrangements: Spouse/significant other Available Help at Discharge: Family;Available 24 hours/day Type of Home: House Home Access: Ramped entrance     Home Layout: One level Home Equipment: Walker - 2 wheels;Walker - 4 wheels;Cane - single point;Bedside commode;Shower seat;Grab bars - toilet      Prior Function Level of Independence: Needs assistance   Gait / Transfers Assistance Needed: using RW or furniture walkingin the house     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     Hand Dominance   Dominant Hand: Right    Extremity/Trunk Assessment   Upper Extremity Assessment Upper Extremity Assessment: Defer to OT evaluation    Lower Extremity Assessment Lower Extremity Assessment: Overall WFL for tasks assessed    Cervical / Trunk Assessment Cervical / Trunk Assessment: Other exceptions Cervical / Trunk Exceptions: hx of  back problems  Communication   Communication: No difficulties  Cognition Arousal/Alertness: Awake/alert Behavior During Therapy: WFL for tasks assessed/performed Overall Cognitive Status: Within Functional Limits for tasks assessed                                         General Comments General comments (skin integrity, edema, etc.): No fmaily present. Back pain was limiting factor today. Pt has been requireing some assist at home PTA.     Exercises     Assessment/Plan    PT Assessment Patient needs continued PT services  PT Problem List Decreased activity tolerance;Decreased mobility;Pain;Obesity       PT Treatment Interventions Gait training;Therapeutic activities;Therapeutic exercise;Patient/family education    PT Goals (Current goals can be found in the Care Plan section)  Acute Rehab PT Goals Patient Stated Goal: to get better PT Goal Formulation: With patient Time For Goal Achievement: 03/27/17 Potential to Achieve Goals: Good    Frequency Min 3X/week   Barriers to discharge        Co-evaluation               AM-PAC PT "6 Clicks" Daily Activity  Outcome Measure Difficulty turning over in bed (including adjusting bedclothes, sheets and blankets)?: Unable Difficulty moving from lying on back to sitting on the side of the bed? : Unable Difficulty sitting down on and standing up from a chair with arms (e.g., wheelchair, bedside commode, etc,.)?: Unable Help needed moving to and from a bed to chair (including a wheelchair)?: A Little Help needed walking in hospital room?: A Little Help needed climbing 3-5 steps with a railing? : Total 6 Click Score: 10    End of Session Equipment Utilized During Treatment: Gait belt Activity Tolerance: Patient limited by pain Patient left: in chair;with bed alarm set;with call bell/phone within reach Nurse Communication: Mobility status PT Visit Diagnosis: Difficulty in walking, not elsewhere classified (R26.2)    Time: 5449-2010 PT Time Calculation (min) (ACUTE ONLY): 24 min   Charges:   PT Evaluation $PT Eval Moderate Complexity: 1 Mod PT Treatments $Gait Training: 8-22 mins   PT G CodesLavonia Dana, [place] 03/20/2017   Melvern Banker 03/20/2017, 10:21  AM

## 2017-03-20 NOTE — Progress Notes (Addendum)
2 Days Post-Op open sigmoidectomy Subjective: Pt feeling better today, nausea better, has a cough.  Pain controlled.  BP has been somewhat low.  Lost IV access for a few hours.  Objective: Vital signs in last 24 hours: Temp:  [98.4 F (36.9 C)-98.8 F (37.1 C)] 98.5 F (36.9 C) (03/10 0511) Pulse Rate:  [71-101] 71 (03/10 0511) Resp:  [16-18] 16 (03/10 0511) BP: (90-110)/(35-48) 110/48 (03/10 0511) SpO2:  [97 %-99 %] 97 % (03/10 0511)   Intake/Output from previous day: 03/09 0701 - 03/10 0700 In: 2837.5 [I.V.:1537.5; IV Piggyback:1300] Out: 3150 [Urine:2500; Drains:650] Intake/Output this shift: No intake/output data recorded.   General appearance: alert and cooperative GI: normal findings: soft, appropriately tender  Incision: Prevena vac in place  Lab Results:  Recent Labs    03/19/17 2247 03/20/17 0508  WBC 17.1* 15.6*  HGB 7.3* 7.7*  HCT 23.3* 24.2*  PLT 318 343   BMET Recent Labs    03/19/17 0428 03/20/17 0508  NA 135 137  K 4.1 4.0  CL 101 106  CO2 25 23  GLUCOSE 161* 90  BUN 17 14  CREATININE 2.06* 1.18*  CALCIUM 8.0* 7.6*   PT/INR No results for input(s): LABPROT, INR in the last 72 hours. ABG No results for input(s): PHART, HCO3 in the last 72 hours.  Invalid input(s): PCO2, PO2  MEDS, Scheduled . enoxaparin (LOVENOX) injection  40 mg Subcutaneous Q24H  . feeding supplement  237 mL Oral BID BM  . losartan  100 mg Oral Daily  . predniSONE  5 mg Oral Q breakfast  . pyridostigmine  60 mg Oral TID  . saccharomyces boulardii  250 mg Oral BID  . spironolactone  25 mg Oral Daily    Studies/Results: Dg C-arm 1-60 Min-no Report  Result Date: 03/18/2017 Fluoroscopy was utilized by the requesting physician.  No radiographic interpretation.    Assessment: s/p Procedure(s): OPEN SIGMOIDECTOMY OPEN HERNIA REPAIR UMBILICAL ADULT CYSTOSCOPY WITH RETROGRADE AND BILATERAL URETERAL STENT PLACEMENT Patient Active Problem List   Diagnosis Date  Noted  . Diverticular stricture ([place]) 03/18/2017  . Skin erythema 11/01/2016  . Sleep difficulties 10/31/2016  . Pelvic abscess in female 10/11/2016  . Bilateral leg edema 08/20/2016  . Recurrent UTI 08/15/2016  . Psoriasis 04/13/2016  . History of diverticulitis 04/13/2016  . Osteopenia of multiple sites 04/13/2016  . DJD (degenerative joint disease), cervical 04/07/2016  . Primary osteoarthritis of both knees 04/07/2016  . Decreased abduction of right shoulder joint 04/07/2016  . Atrial tachycardia ([place]) 05/12/2015  . NSVT (nonsustained ventricular tachycardia) ([place]) 05/12/2015  . Abnormal nuclear stress test - INTERMEDIATE RISK 04/14/2015  . Coronary artery calcification seen on CAT scan 02/28/2015  . Left lower quadrant pain 02/17/2015  . Hyperglycemia 08/09/2014  . Dyspnea 03/20/2013  . Hypokalemia 12/31/2012  . Umbilical hernia 09/38/1829  . Pleural effusion 05/10/2012  . Myasthenia gravis without exacerbation ([place]) 02/12/2010  . DIPLOPIA 11/21/2009  . Vitamin D deficiency 06/25/2008  . Hyperlipidemia 06/25/2008  . Essential hypertension 06/25/2008  . Leukocytoclastic vasculitis ([place]) 06/19/2007  . MUSCLE WEAKNESS (GENERALIZED) 02/10/2007  . Unspecified Myalgia and Myositis 01/24/2007  . EDEMA- LOCALIZED 01/24/2007  . ANA POSITIVE, HX OF 01/24/2007    Expected post op course  Plan: Acute blood loss anemia: Hgb relatively stable.  HD stable.  Will recheck in AM and cont IVF's today Acute Renal Insufficiency: resolved with IV fluids, foley out Hold lasix until BP better, hold BP meds if SBP<120 Will try some clears today  OOB and ambulating in hall    LOS: 2 days     .Rosario Adie, [facility], [place]   03/20/2017 8:06 AM

## 2017-03-21 ENCOUNTER — Telehealth: Payer: Self-pay | Admitting: *Deleted

## 2017-03-21 LAB — CBC
HCT: 22.7 % — ABNORMAL LOW (ref 36.0–46.0)
Hemoglobin: 7.2 g/dL — ABNORMAL LOW (ref 12.0–15.0)
MCH: 28.1 pg (ref 26.0–34.0)
MCHC: 31.7 g/dL (ref 30.0–36.0)
MCV: 88.7 fL (ref 78.0–100.0)
PLATELETS: 298 10*3/uL (ref 150–400)
RBC: 2.56 MIL/uL — ABNORMAL LOW (ref 3.87–5.11)
RDW: 15.3 % (ref 11.5–15.5)
WBC: 10.7 10*3/uL — AB (ref 4.0–10.5)

## 2017-03-21 LAB — BASIC METABOLIC PANEL
Anion gap: 4 — ABNORMAL LOW (ref 5–15)
BUN: 10 mg/dL (ref 6–20)
CHLORIDE: 107 mmol/L (ref 101–111)
CO2: 28 mmol/L (ref 22–32)
CREATININE: 0.83 mg/dL (ref 0.44–1.00)
Calcium: 7.7 mg/dL — ABNORMAL LOW (ref 8.9–10.3)
GFR calc non Af Amer: >60 mL/min (ref >60)
Glucose, Bld: 98 mg/dL (ref 65–99)
Potassium: 4.3 mmol/L (ref 3.5–5.1)
SODIUM: 139 mmol/L (ref 135–145)

## 2017-03-21 MED ORDER — HYDROMORPHONE HCL 1 MG/ML IJ SOLN: 0.5000 mg | INTRAMUSCULAR | Status: DC | PRN

## 2017-03-21 MED ORDER — METHOCARBAMOL 500 MG PO TABS
1000.0000 mg | ORAL_TABLET | Freq: Four times a day (QID) | ORAL | Status: DC
  Administered 2017-03-21 – 2017-03-26 (×18): 1000 mg via ORAL
  Filled 2017-03-21 (×20): qty 2

## 2017-03-21 MED ORDER — HYDROMORPHONE HCL 1 MG/ML IJ SOLN
0.5000 mg | INTRAMUSCULAR | Status: DC | PRN
  Administered 2017-03-21 – 2017-03-25 (×14): 1 mg via INTRAVENOUS
  Filled 2017-03-21 (×15): qty 1

## 2017-03-21 MED ORDER — BOOST / RESOURCE BREEZE PO LIQD CUSTOM
1.0000 | Freq: Three times a day (TID) | ORAL | Status: DC
  Administered 2017-03-21 – 2017-03-25 (×7): 1 via ORAL

## 2017-03-21 MED ORDER — PROMETHAZINE HCL 25 MG/ML IJ SOLN
12.5000 mg | Freq: Four times a day (QID) | INTRAMUSCULAR | Status: DC | PRN
  Administered 2017-03-23: 12.5 mg via INTRAVENOUS
  Filled 2017-03-21: qty 1

## 2017-03-21 MED ORDER — FUROSEMIDE 20 MG PO TABS
20.0000 mg | ORAL_TABLET | Freq: Every day | ORAL | Status: DC
  Administered 2017-03-21: 20 mg via ORAL
  Filled 2017-03-21 (×2): qty 1

## 2017-03-21 NOTE — Telephone Encounter (Signed)
Pt was on TCM report admitted 03/18/17 for procedure Cystoscopy with bilateral retrograde pyelogram with interpretation, fluoroscopy less than 1 hour, bilateral open-ended ureteral catheter placement. Pt was D/C later on that day. Pt will f/u w/gasterentologist../lmb

## 2017-03-21 NOTE — Addendum Note (Signed)
Addendum  created 03/21/17 0716 by Lollie Sails, CRNA   Charge Capture section accepted

## 2017-03-21 NOTE — Progress Notes (Signed)
3 Days Post-Op open sigmoidectomy Subjective: Pt c/o back pain, no nausea.  Abd pain controlled.  BP better.  Passing flatus.  Did not drink much yesterday  Objective: Vital signs in last 24 hours: Temp:  [98.1 F (36.7 C)-98.6 F (37 C)] 98.3 F (36.8 C) (03/11 0626) Pulse Rate:  [72-75] 72 (03/11 0626) Resp:  [16-18] 16 (03/11 0626) BP: (120-141)/(38-49) 141/49 (03/11 0626) SpO2:  [98 %-99 %] 98 % (03/11 0626) Weight:  [115.7 kg (255 lb 1.6 oz)] 115.7 kg (255 lb 1.6 oz) (03/11 0500)   Intake/Output from previous day: 03/10 0701 - 03/11 0700 In: 2340 [P.O.:840; I.V.:1500] Out: 1250 [Urine:1100; Drains:150] Intake/Output this shift: No intake/output data recorded.   General appearance: alert and cooperative GI: normal findings: soft, appropriately tender  Incision: Prevena vac in place  Lab Results:  Recent Labs    03/20/17 0508 03/21/17 0442  WBC 15.6* 10.7*  HGB 7.7* 7.2*  HCT 24.2* 22.7*  PLT 343 298   BMET Recent Labs    03/20/17 0508 03/21/17 0442  NA 137 139  K 4.0 4.3  CL 106 107  CO2 23 28  GLUCOSE 90 98  BUN 14 10  CREATININE 1.18* 0.83  CALCIUM 7.6* 7.7*   PT/INR No results for input(s): LABPROT, INR in the last 72 hours. ABG No results for input(s): PHART, HCO3 in the last 72 hours.  Invalid input(s): PCO2, PO2  MEDS, Scheduled . enoxaparin (LOVENOX) injection  40 mg Subcutaneous Q24H  . feeding supplement  1 Container Oral TID BM  . furosemide  20 mg Oral Daily  . losartan  100 mg Oral Daily  . methocarbamol  1,000 mg Oral QID  . predniSONE  5 mg Oral Q breakfast  . pyridostigmine  60 mg Oral TID  . saccharomyces boulardii  250 mg Oral BID  . spironolactone  25 mg Oral Daily    Studies/Results: No results found.  Assessment: s/p Procedure(s): OPEN SIGMOIDECTOMY OPEN HERNIA REPAIR UMBILICAL ADULT CYSTOSCOPY WITH RETROGRADE AND BILATERAL URETERAL STENT PLACEMENT Patient Active Problem List   Diagnosis Date Noted  .  Diverticular stricture ([place]) 03/18/2017  . Skin erythema 11/01/2016  . Sleep difficulties 10/31/2016  . Pelvic abscess in female 10/11/2016  . Bilateral leg edema 08/20/2016  . Recurrent UTI 08/15/2016  . Psoriasis 04/13/2016  . History of diverticulitis 04/13/2016  . Osteopenia of multiple sites 04/13/2016  . DJD (degenerative joint disease), cervical 04/07/2016  . Primary osteoarthritis of both knees 04/07/2016  . Decreased abduction of right shoulder joint 04/07/2016  . Atrial tachycardia ([place]) 05/12/2015  . NSVT (nonsustained ventricular tachycardia) ([place]) 05/12/2015  . Abnormal nuclear stress test - INTERMEDIATE RISK 04/14/2015  . Coronary artery calcification seen on CAT scan 02/28/2015  . Left lower quadrant pain 02/17/2015  . Hyperglycemia 08/09/2014  . Dyspnea 03/20/2013  . Hypokalemia 12/31/2012  . Umbilical hernia 73/71/0626  . Pleural effusion 05/10/2012  . Myasthenia gravis without exacerbation ([place]) 02/12/2010  . DIPLOPIA 11/21/2009  . Vitamin D deficiency 06/25/2008  . Hyperlipidemia 06/25/2008  . Essential hypertension 06/25/2008  . Leukocytoclastic vasculitis ([place]) 06/19/2007  . MUSCLE WEAKNESS (GENERALIZED) 02/10/2007  . Unspecified Myalgia and Myositis 01/24/2007  . EDEMA- LOCALIZED 01/24/2007  . ANA POSITIVE, HX OF 01/24/2007    Expected post op course  Plan: Acute blood loss anemia: Hgb relatively stable.  HD stable.  Will recheck in AM and cont min IVF's today Acute Renal Insufficiency: resolved with IV fluids, foley out Half dose lasix today, hold BP  meds if SBP<120 Will try Breeze supplement with her clears today OOB and ambulating in hall    LOS: 3 days     .Rosario Adie, [facility], [place]   03/21/2017 7:33 AM

## 2017-03-22 LAB — CBC
HCT: 29 % — ABNORMAL LOW (ref 36.0–46.0)
Hemoglobin: 8.7 g/dL — ABNORMAL LOW (ref 12.0–15.0)
MCH: 26.9 pg (ref 26.0–34.0)
MCHC: 30 g/dL (ref 30.0–36.0)
MCV: 89.5 fL (ref 78.0–100.0)
PLATELETS: 473 10*3/uL — AB (ref 150–400)
RBC: 3.24 MIL/uL — AB (ref 3.87–5.11)
RDW: 15.2 % (ref 11.5–15.5)
WBC: 11.5 10*3/uL — AB (ref 4.0–10.5)

## 2017-03-22 MED ORDER — FUROSEMIDE 20 MG PO TABS
20.0000 mg | ORAL_TABLET | ORAL | Status: DC
  Filled 2017-03-22: qty 1

## 2017-03-22 NOTE — Care Management Important Message (Signed)
Important Message  Patient Details  Name: Kimberly [name] MRN: [id number] Date of Birth: 1945-06-24   Medicare Important Message Given:  Yes    Kerin Salen 03/22/2017, 11:18 AMImportant Message  Patient Details  Name: Kimberly [name] MRN: [id number] Date of Birth: 12-24-1945   Medicare Important Message Given:  Yes    Kerin Salen 03/22/2017, 11:18 AM

## 2017-03-22 NOTE — Progress Notes (Signed)
4 Days Post-Op open sigmoidectomy Subjective:  back pain better with robaxin, no nausea.  Abd pain controlled.  BP stable.  Passing flatus and having BM's.  Tolerating liquids Objective: Vital signs in last 24 hours: Temp:  [97.9 F (36.6 C)-98.1 F (36.7 C)] 98 F (36.7 C) (03/12 0529) Pulse Rate:  [66-73] 73 (03/12 0529) Resp:  [18] 18 (03/12 0529) BP: (98-132)/(41-81) 112/45 (03/12 0529) SpO2:  [97 %-100 %] 100 % (03/12 0529) Weight:  [119.6 kg (263 lb 10.7 oz)] 119.6 kg (263 lb 10.7 oz) (03/12 0529)   Intake/Output from previous day: 03/11 0701 - 03/12 0700 In: 1310 [P.O.:710; I.V.:600] Out: 450 [Drains:450] Intake/Output this shift: No intake/output data recorded.   General appearance: alert and cooperative GI: normal findings: soft, appropriately tender  Incision: Prevena vac in place  Lab Results:  Recent Labs    03/21/17 0442 03/22/17 0747  WBC 10.7* 11.5*  HGB 7.2* 8.7*  HCT 22.7* 29.0*  PLT 298 473*   BMET Recent Labs    03/20/17 0508 03/21/17 0442  NA 137 139  K 4.0 4.3  CL 106 107  CO2 23 28  GLUCOSE 90 98  BUN 14 10  CREATININE 1.18* 0.83  CALCIUM 7.6* 7.7*   PT/INR No results for input(s): LABPROT, INR in the last 72 hours. ABG No results for input(s): PHART, HCO3 in the last 72 hours.  Invalid input(s): PCO2, PO2  MEDS, Scheduled . enoxaparin (LOVENOX) injection  40 mg Subcutaneous Q24H  . feeding supplement  1 Container Oral TID BM  . [START ON 03/23/2017] furosemide  20 mg Oral QODAY  . losartan  100 mg Oral Daily  . methocarbamol  1,000 mg Oral QID  . predniSONE  5 mg Oral Q breakfast  . pyridostigmine  60 mg Oral TID  . saccharomyces boulardii  250 mg Oral BID  . spironolactone  25 mg Oral Daily    Studies/Results: No results found.  Assessment: s/p Procedure(s): OPEN SIGMOIDECTOMY OPEN HERNIA REPAIR UMBILICAL ADULT CYSTOSCOPY WITH RETROGRADE AND BILATERAL URETERAL STENT PLACEMENT Patient Active Problem List   Diagnosis Date Noted  . Diverticular stricture ([place]) 03/18/2017  . Skin erythema 11/01/2016  . Sleep difficulties 10/31/2016  . Pelvic abscess in female 10/11/2016  . Bilateral leg edema 08/20/2016  . Recurrent UTI 08/15/2016  . Psoriasis 04/13/2016  . History of diverticulitis 04/13/2016  . Osteopenia of multiple sites 04/13/2016  . DJD (degenerative joint disease), cervical 04/07/2016  . Primary osteoarthritis of both knees 04/07/2016  . Decreased abduction of right shoulder joint 04/07/2016  . Atrial tachycardia ([place]) 05/12/2015  . NSVT (nonsustained ventricular tachycardia) ([place]) 05/12/2015  . Abnormal nuclear stress test - INTERMEDIATE RISK 04/14/2015  . Coronary artery calcification seen on CAT scan 02/28/2015  . Left lower quadrant pain 02/17/2015  . Hyperglycemia 08/09/2014  . Dyspnea 03/20/2013  . Hypokalemia 12/31/2012  . Umbilical hernia 16/10/9602  . Pleural effusion 05/10/2012  . Myasthenia gravis without exacerbation ([place]) 02/12/2010  . DIPLOPIA 11/21/2009  . Vitamin D deficiency 06/25/2008  . Hyperlipidemia 06/25/2008  . Essential hypertension 06/25/2008  . Leukocytoclastic vasculitis ([place]) 06/19/2007  . MUSCLE WEAKNESS (GENERALIZED) 02/10/2007  . Unspecified Myalgia and Myositis 01/24/2007  . EDEMA- LOCALIZED 01/24/2007  . ANA POSITIVE, HX OF 01/24/2007    Expected post op course  Plan: Acute blood loss anemia: Hgb stable.  HD stable.   Acute Renal Insufficiency: resolved with IV fluids, foley out Half dose lasix every other day, hold BP meds if SBP<120 Advance to  soft diet Cont Breeze supplement between meals today OOB and ambulating in hall    LOS: 4 days     .Rosario Adie, [facility], [place]   03/22/2017 8:34 AM

## 2017-03-22 NOTE — Progress Notes (Addendum)
Occupational Therapy Treatment Patient Details Name: Kimberly [name] MRN: [id number] DOB: 1945/09/12 Today's Date: 03/22/2017    History of present illness 72 year old female with myasthenia gravis who has a known colovesicular fistula from diverticular disease.She has had multiple abdominal surgeries over many years. Diagnosed with myasthenia gravis in 2011. 3/8 underwent open sigmoidectomy, open hernia repair and cystoscopy wtih retrograde and B urithral stent placement.  Wound vac curently in place. Significant PMH: arthritis, R frozen shoulder; L partial frozen shoulder (per pt); vasculitis.   OT comments  Pt educated on various AE options for self care tasks including long handle sponges, toilet aides and also about wide 3in1 option. Pt practiced up to the bathroom with walker but pain increased from 6/10 to 9/10 on the way back out of the bathroom. Pt states she had pain meds. Pt appreciative of OT information and recommendations.  Will continue to follow. Pt is inquiring about the cost of a wide 3in1 before deciding to order.   Follow Up Recommendations  Home health OT;Supervision/Assistance - 24 hour    Equipment Recommendations  Wide 3 in 1 bedside commode(pt inquiring about cost of a wide 3in1)    Recommendations for Other Services      Precautions / Restrictions Precautions Precautions: Fall Restrictions Other Position/Activity Restrictions: abdominal wound vac       Mobility Bed Mobility               General bed mobility comments: in chair.   Transfers Overall transfer level: Needs assistance Equipment used: Rolling walker (2 wheeled) Transfers: Sit to/from Stand Sit to Stand: Min assist         General transfer comment: min assist to safely descend to commode with grab bar use. Min assist also to ascend from recliner and toilet. verbal cues for safety.    Balance                                           ADL either performed or  assessed with clinical judgement   ADL                           Toilet Transfer: Minimal assistance;Ambulation;Grab bars;RW   Toileting- Clothing Manipulation and Hygiene: Minimal assistance;Sit to/from stand         General ADL Comments: Discussed ROM limitations at length in UEs and how this impacts her function. She states she has a long handled sponge but needs a new one. discussed the long handle sponge at the medical supply store and the one in the gift shop at [facility] and how the handles are bendable to help her with washing around her back or sides. Also discussed the toilet aid. She currently uses a long spoon with wet wipes but OT educated on the curve toilet aid also if she ever decides this curved handled might help her better. Practiced toilet transfer and pt did well except somewhat of a "plop" back onto lower toilet surface even with the bar. Pt is looking into a wide 3in1 option currently on her own. She is inquring about the cost of a wide 3in1. Left a note in chart for case manager. Pt having back pain with transitioning out of the bathroom and was in more of a hurry to get back to her chair and needed most assist with the walker to be  safe. She rated back pain 9/10 at the worst and 6/10 with initial mobility. Discussed option of having her 3in1 closer to her at home (in her room) if having a lot of back pain to help with safety of transfers.      Vision Patient Visual Report: No change from baseline     Perception     Praxis      Cognition Arousal/Alertness: Awake/alert Behavior During Therapy: WFL for tasks assessed/performed Overall Cognitive Status: Within Functional Limits for tasks assessed                                          Exercises     Shoulder Instructions       General Comments      Pertinent Vitals/ Pain       Pain Assessment: 0-10 Pain Score: 9  Pain Location: back Pain Descriptors / Indicators:  Grimacing;Moaning Pain Intervention(s): Monitored during session;Repositioned  Home Living                                          Prior Functioning/Environment              Frequency  Min 3X/week        Progress Toward Goals  OT Goals(current goals can now be found in the care plan section)  Progress towards OT goals: Progressing toward goals     Plan Discharge plan remains appropriate    Co-evaluation                 AM-PAC PT "6 Clicks" Daily Activity     Outcome Measure   Help from another person eating meals?: None Help from another person taking care of personal grooming?: A Little Help from another person toileting, which includes using toliet, bedpan, or urinal?: A Little Help from another person bathing (including washing, rinsing, drying)?: A Lot Help from another person to put on and taking off regular upper body clothing?: A Lot Help from another person to put on and taking off regular lower body clothing?: A Lot 6 Click Score: 16    End of Session Equipment Utilized During Treatment: Rolling walker  OT Visit Diagnosis: Unsteadiness on feet (R26.81);Muscle weakness (generalized) (M62.81)   Activity Tolerance Patient limited by pain   Patient Left in chair;with call bell/phone within reach;with family/visitor present   Nurse Communication          Time: 1230-1300 OT Time Calculation (min): 30 min  Charges: OT General Charges $OT Visit: 1 Visit OT Treatments $Self Care/Home Management : 8-22 mins $Therapeutic Activity: 8-22 mins     Kimberly [name] 03/22/2017, 1:40 PM

## 2017-03-22 NOTE — Progress Notes (Signed)
Physical Therapy Treatment Patient Details Name: Kimberly [name] MRN: [id number] DOB: [birth year] Today's Date: 03/22/2017    History of Present Illness 72 year old female with myasthenia gravis who has a known colovesicular fistula from diverticular disease.She has had multiple abdominal surgeries over many years. Diagnosed with myasthenia gravis in 2011. 3/8 underwent open sigmoidectomy, open hernia repair and cystoscopy wtih retrograde and B urithral stent placement.  Wound vac curently in place. Significant PMH: arthritis, R frozen shoulder; L partial frozen shoulder (per pt); vasculitis.    PT Comments    Pt OOB in recliner requesting muscle relaxer before she walks (did have pain meds already).  Pt worried about new onset of back pain after surgery.  "They did something to me when I was on the table".  Pt did amb a greater distance but with much pain and use of walker.     Follow Up Recommendations  Home health PT;Supervision for mobility/OOB     Equipment Recommendations  None recommended by PT    Recommendations for Other Services       Precautions / Restrictions Precautions Precautions: Fall Precaution Comments: recent ABD surgery and B bed shoulders Restrictions Weight Bearing Restrictions: No Other Position/Activity Restrictions: abdominal wound vac    Mobility  Bed Mobility               General bed mobility comments: OOB in recliner  Transfers Overall transfer level: Needs assistance Equipment used: Rolling walker (2 wheeled) Transfers: Sit to/from Stand Sit to Stand: Supervision;Min guard         General transfer comment: pt uses forward momentum and hands to push self to rise and does well to control desend.  Ambulation/Gait Ambulation/Gait assistance: Min guard;+2 safety/equipment Ambulation Distance (Feet): 75 Feet(one sitting rest break) Assistive device: Rolling walker (2 wheeled) Gait Pattern/deviations: Step-to pattern;Wide base of  support;Decreased stride length Gait velocity: decreased   General Gait Details: gait limited by new onset of L lower back pain.  Spouse followed with recliner as pt did need one sitting rest break.   Stairs            Wheelchair Mobility    Modified Rankin (Stroke Patients Only)       Balance                                            Cognition Arousal/Alertness: Awake/alert Behavior During Therapy: WFL for tasks assessed/performed Overall Cognitive Status: Within Functional Limits for tasks assessed                                 General Comments: pt frustarted with her progress, eager to go home      Exercises      General Comments        Pertinent Vitals/Pain Pain Assessment: Faces Pain Score: 9  Faces Pain Scale: Hurts even more Pain Location: back Pain Descriptors / Indicators: Grimacing;Moaning Pain Intervention(s): Monitored during session;Repositioned;Patient requesting pain meds-RN notified    Home Living                      Prior Function            PT Goals (current goals can now be found in the care plan section) Progress towards PT goals: Progressing toward goals  Frequency    Min 3X/week      PT Plan Current plan remains appropriate    Co-evaluation              AM-PAC PT "6 Clicks" Daily Activity  Outcome Measure  Difficulty turning over in bed (including adjusting bedclothes, sheets and blankets)?: A Lot Difficulty moving from lying on back to sitting on the side of the bed? : A Little Difficulty sitting down on and standing up from a chair with arms (e.g., wheelchair, bedside commode, etc,.)?: A Little Help needed moving to and from a bed to chair (including a wheelchair)?: A Little Help needed walking in hospital room?: A Little Help needed climbing 3-5 steps with a railing? : A Lot 6 Click Score: 16    End of Session Equipment Utilized During Treatment: Gait  belt Activity Tolerance: Patient limited by pain Patient left: in chair;with bed alarm set;with call bell/phone within reach Nurse Communication: Mobility status PT Visit Diagnosis: Difficulty in walking, not elsewhere classified (R26.2)     Time: 9702-6378 PT Time Calculation (min) (ACUTE ONLY): 28 min  Charges:  $Gait Training: 8-22 mins $Therapeutic Activity: 8-22 mins                    G Codes:       Kimberly [name]  PTA [facility]  Acute  Rehab Pager      [phone]

## 2017-03-23 MED ORDER — OXYCODONE HCL 5 MG PO TABS
5.0000 mg | ORAL_TABLET | ORAL | Status: DC | PRN
  Administered 2017-03-23 (×2): 5 mg via ORAL
  Administered 2017-03-24 – 2017-03-26 (×4): 10 mg via ORAL
  Filled 2017-03-23: qty 2
  Filled 2017-03-23: qty 1
  Filled 2017-03-23: qty 2
  Filled 2017-03-23: qty 1
  Filled 2017-03-23 (×2): qty 2

## 2017-03-23 NOTE — Progress Notes (Signed)
5 Days Post-Op open sigmoidectomy Subjective:  back pain about the same with scheduled robaxin, some nausea with meals.  Abd pain controlled.  BP stable.  Passing flatus and having BM's.  Tolerating soft foods in minimal quantities Objective: Vital signs in last 24 hours: Temp:  [98 F (36.7 C)-98.4 F (36.9 C)] 98 F (36.7 C) (03/13 0508) Pulse Rate:  [72-77] 75 (03/13 0508) Resp:  [17-18] 17 (03/13 0508) BP: (101-125)/(49-62) 115/57 (03/13 0508) SpO2:  [97 %-100 %] 100 % (03/13 0508)   Intake/Output from previous day: 03/12 0701 - 03/13 0700 In: 31482.7 [P.O.:740; I.V.:342.7; IV Piggyback:30400] Out: 500 [Drains:500] Intake/Output this shift: No intake/output data recorded.   General appearance: alert and cooperative GI: normal findings: soft, appropriately tender  Incision: Prevena vac in place  Lab Results:  Recent Labs    03/21/17 0442 03/22/17 0747  WBC 10.7* 11.5*  HGB 7.2* 8.7*  HCT 22.7* 29.0*  PLT 298 473*   BMET Recent Labs    03/21/17 0442  NA 139  K 4.3  CL 107  CO2 28  GLUCOSE 98  BUN 10  CREATININE 0.83  CALCIUM 7.7*   PT/INR No results for input(s): LABPROT, INR in the last 72 hours. ABG No results for input(s): PHART, HCO3 in the last 72 hours.  Invalid input(s): PCO2, PO2  MEDS, Scheduled . enoxaparin (LOVENOX) injection  40 mg Subcutaneous Q24H  . feeding supplement  1 Container Oral TID BM  . losartan  100 mg Oral Daily  . methocarbamol  1,000 mg Oral QID  . predniSONE  5 mg Oral Q breakfast  . pyridostigmine  60 mg Oral TID  . saccharomyces boulardii  250 mg Oral BID  . spironolactone  25 mg Oral Daily    Studies/Results: No results found.  Assessment: s/p Procedure(s): OPEN SIGMOIDECTOMY OPEN HERNIA REPAIR UMBILICAL ADULT CYSTOSCOPY WITH RETROGRADE AND BILATERAL URETERAL STENT PLACEMENT Patient Active Problem List   Diagnosis Date Noted  . Diverticular stricture ([place]) 03/18/2017  . Skin erythema 11/01/2016  . Sleep  difficulties 10/31/2016  . Pelvic abscess in female 10/11/2016  . Bilateral leg edema 08/20/2016  . Recurrent UTI 08/15/2016  . Psoriasis 04/13/2016  . History of diverticulitis 04/13/2016  . Osteopenia of multiple sites 04/13/2016  . DJD (degenerative joint disease), cervical 04/07/2016  . Primary osteoarthritis of both knees 04/07/2016  . Decreased abduction of right shoulder joint 04/07/2016  . Atrial tachycardia ([place]) 05/12/2015  . NSVT (nonsustained ventricular tachycardia) ([place]) 05/12/2015  . Abnormal nuclear stress test - INTERMEDIATE RISK 04/14/2015  . Coronary artery calcification seen on CAT scan 02/28/2015  . Left lower quadrant pain 02/17/2015  . Hyperglycemia 08/09/2014  . Dyspnea 03/20/2013  . Hypokalemia 12/31/2012  . Umbilical hernia 69/48/5462  . Pleural effusion 05/10/2012  . Myasthenia gravis without exacerbation ([place]) 02/12/2010  . DIPLOPIA 11/21/2009  . Vitamin D deficiency 06/25/2008  . Hyperlipidemia 06/25/2008  . Essential hypertension 06/25/2008  . Leukocytoclastic vasculitis ([place]) 06/19/2007  . MUSCLE WEAKNESS (GENERALIZED) 02/10/2007  . Unspecified Myalgia and Myositis 01/24/2007  . EDEMA- LOCALIZED 01/24/2007  . ANA POSITIVE, HX OF 01/24/2007    Expected post op course  Plan: Acute blood loss anemia: Hgb stable.  HD stable.   Acute Renal Insufficiency: resolved with IV fluids, foley out Hold lasix today, hold BP meds if SBP<120 Cont soft diet as tolerated Cont Breeze supplement between meals today OOB and ambulating in hall  PO pain meds   LOS: 5 days     .Elmo Putt  Marijo Conception, MD [facility], [place]   03/23/2017 8:24 AM

## 2017-03-23 NOTE — Progress Notes (Signed)
Spoke with patient and spouse at bedside. Discussed orders for [facility] services. Patient is not sure she would benefit from [facility] services, states she has use [facility] in the past but that was for nursing to assist with drain care. She would like to wait and discuss with Dr. Marcello Moores, I will f/u with her later. I made the nurse aware that no services have been set up. [phone]

## 2017-03-23 NOTE — Progress Notes (Signed)
OT Cancellation Note  Patient Details Name: Kimberly [name] MRN: [id number] DOB: 1945/03/20   Cancelled Treatment:    Reason Eval/Treat Not Completed: Other (comment). RN gave pt anti-nausea medication and she is sleeping soundly. Will try to return later if possible  Luisfelipe Engelstad 03/23/2017, 10:37 AM  Lesle Chris, OTR/L [phone] 03/23/2017

## 2017-03-24 ENCOUNTER — Inpatient Hospital Stay (HOSPITAL_COMMUNITY): Payer: Medicare Other

## 2017-03-24 ENCOUNTER — Other Ambulatory Visit: Payer: Self-pay

## 2017-03-24 ENCOUNTER — Encounter (HOSPITAL_COMMUNITY): Payer: Self-pay | Admitting: Cardiology

## 2017-03-24 DIAGNOSIS — I4891 Unspecified atrial fibrillation: Secondary | ICD-10-CM

## 2017-03-24 LAB — ECHOCARDIOGRAM COMPLETE
Height: 62 in
WEIGHTICAEL: 4195.2 [oz_av]

## 2017-03-24 LAB — TROPONIN I

## 2017-03-24 LAB — TSH: TSH: 3.152 u[IU]/mL (ref 0.350–4.500)

## 2017-03-24 LAB — HEPARIN LEVEL (UNFRACTIONATED): Heparin Unfractionated: 0.29 IU/mL — ABNORMAL LOW (ref 0.30–0.70)

## 2017-03-24 MED ORDER — TRAZODONE HCL 50 MG PO TABS
50.0000 mg | ORAL_TABLET | Freq: Every day | ORAL | Status: AC
  Administered 2017-03-24: 50 mg via ORAL
  Filled 2017-03-24: qty 1

## 2017-03-24 MED ORDER — METOPROLOL TARTRATE 5 MG/5ML IV SOLN
2.5000 mg | Freq: Once | INTRAVENOUS | Status: AC
  Administered 2017-03-24: 2.5 mg via INTRAVENOUS

## 2017-03-24 MED ORDER — FAMOTIDINE 20 MG PO TABS
20.0000 mg | ORAL_TABLET | Freq: Two times a day (BID) | ORAL | Status: DC
  Administered 2017-03-24 – 2017-03-26 (×4): 20 mg via ORAL
  Filled 2017-03-24 (×4): qty 1

## 2017-03-24 MED ORDER — AMIODARONE HCL IN DEXTROSE 360-4.14 MG/200ML-% IV SOLN
60.0000 mg/h | INTRAVENOUS | Status: AC
  Administered 2017-03-24 (×2): 60 mg/h via INTRAVENOUS
  Filled 2017-03-24 (×2): qty 200

## 2017-03-24 MED ORDER — AMIODARONE LOAD VIA INFUSION
150.0000 mg | Freq: Once | INTRAVENOUS | Status: AC
  Administered 2017-03-24: 150 mg via INTRAVENOUS
  Filled 2017-03-24: qty 83.34

## 2017-03-24 MED ORDER — HEPARIN (PORCINE) IN NACL 100-0.45 UNIT/ML-% IJ SOLN
1150.0000 [IU]/h | INTRAMUSCULAR | Status: DC
  Administered 2017-03-24: 1150 [IU]/h via INTRAVENOUS
  Filled 2017-03-24: qty 250

## 2017-03-24 MED ORDER — AMIODARONE HCL IN DEXTROSE 360-4.14 MG/200ML-% IV SOLN
30.0000 mg/h | INTRAVENOUS | Status: DC
  Administered 2017-03-25: 30 mg/h via INTRAVENOUS
  Filled 2017-03-24: qty 200

## 2017-03-24 MED ORDER — CALCIUM CARBONATE ANTACID 500 MG PO CHEW
1.0000 | CHEWABLE_TABLET | Freq: Three times a day (TID) | ORAL | Status: DC | PRN
  Administered 2017-03-24: 200 mg via ORAL
  Filled 2017-03-24: qty 1

## 2017-03-24 MED ORDER — METOPROLOL TARTRATE 5 MG/5ML IV SOLN
INTRAVENOUS | Status: AC
  Filled 2017-03-24: qty 5

## 2017-03-24 NOTE — Progress Notes (Signed)
Pt called out stating "My heart is racing," pt anxious BP. Reassurance given verbally. VS- BP 170/149 then 105/58 upon recheck few minutes later; HR sustained in 150's -160's. Afebrile. Lying back in recliner. NT asked to get EKG.

## 2017-03-24 NOTE — Consult Note (Addendum)
Cardiology Consultation:   Patient ID: [name]; [id number]; [birth year]   Admit date: 03/18/2017 Date of Consult: 03/24/2017  Primary Care Provider: [name], MD Primary Cardiologist: Skeet Latch, MD  Primary Electrophysiologist:     Patient Profile:   Kimberly [name] is a 72 y.o. female with a hx of myasthenia gravis, coronary artery calcification, recurrent R pleural effusion, HLD, recurrent UTIs, and diverticulitis, and R LE lymphedema who is being seen today for the evaluation of Afib RVR at tthe request of Dr. Marcello Moores.  History of Present Illness:   Kimberly [name] follows with Dr. Oval Linsey and was last seen in clinic 09/16/16. At that time, she was doing well. Chest CT for pleural effusions was noted to have coronary calcification. Follow up lexiscan was concerning for reversible ischemia. She ultimately underwent heart catheterization which showed normal cors but tortuous arteries. She also complained of palpitations and wore a 30-day event monitor which showed atrial tachycardia and NSVT, but no Afib. She was not started on AV nodal agents at that time due to her myasthenia gravis. Echo 05/2015 with normal LVEF 60-65% and normal diastolic dysfunction.   Kimberly [name] presented to the hospital for repair of a colovesicular fistula from diverticular disease via open sigmoidectomy on 03/18/17. She was recovering well from surgery and regaining bowel function. On POD #6 (03/24/17) she was noted to be in Afib RVR at approximately 0930, confirmed with EKG. Rates were initially in the 160s, improved to the 130s with one dose of IV lopressor 2.5 mg. She remains hemodynamically stable, but feels fatigued. She states she has palpitations occasionally that self-resolve at home. These episodes generally wake her from sleep at night. She states that she woke up last night with what felt like a panic attack that lasted about 2 hrs. Question if she was having Afib vs atrial tachycardia last  evening. She denies chest pain. Lasix has been held this admission post-surgery. She is tolerating the rhythm so far, with her only complaint being fatigue.    Past Medical History:  Diagnosis Date  . Allergy   . Aortic atherosclerosis ([place])   . Atrial tachycardia ([place]) 05/12/2015  . Cataract   . Colovesical fistula   . Diverticulitis   . Diverticulosis   . DJD (degenerative joint disease), cervical   . Frequent UTI   . GERD (gastroesophageal reflux disease)   . History of hiatal hernia    Small hernia  . Hypertension   . Internal hemorrhoids 01/21/2017   noted on colonopscopy  . Leukocytoclastic vasculitis ([place])   . Myalgia and myositis, unspecified   . Myasthenia gravis without exacerbation (HCC)    [facility], Dr Anna Genre  . NSVT (nonsustained ventricular tachycardia) ([place]) 05/12/2015  . Osteoarthritis    Both knees  . Osteopenia   . Other and unspecified hyperlipidemia   . Periumbilical hernia   . Pneumonia 04/2016  . PONV (postoperative nausea and vomiting)   . Psoriasis    plantar ;Dr Jarome Matin  . Sleep difficulties   . Vasculitis ([place])    Dr Estanislado Pandy  . Vitamin D deficiency     Past Surgical History:  Procedure Laterality Date  . APPENDECTOMY     & exploratory for inflammation; Dr Hassell Done  . CARDIAC CATHETERIZATION N/A 04/17/2015   Procedure: Left Heart Cath and Coronary Angiography;  Surgeon: Leonie Man, MD;  Location: [facility];  Service: Cardiovascular;  Laterality: N/A;  . CERVICAL FUSION     C5-6; Dr Vertell Limber  .  CHOLECYSTECTOMY     for stones  . COLONOSCOPY W/ POLYPECTOMY  01/21/2017  . CYSTOSCOPY WITH STENT PLACEMENT Bilateral 03/18/2017   Procedure: CYSTOSCOPY WITH RETROGRADE AND BILATERAL URETERAL STENT PLACEMENT;  Surgeon: Lucas Mallow, MD;  Location: [facility];  Service: Urology;  Laterality: Bilateral;  . IR RADIOLOGIST EVAL & MGMT  11/16/2016  . IR RADIOLOGIST EVAL & MGMT  12/08/2016  . IR RADIOLOGIST EVAL & MGMT  12/16/2016  . LAMINECTOMY   2010   T1-2; Dr Vertell Limber  . PARTIAL COLECTOMY N/A 03/18/2017   Procedure: OPEN SIGMOIDECTOMY;  Surgeon: Leighton Ruff, MD;  Location: [facility];  Service: General;  Laterality: N/A;  . SHOULDER SURGERY     Left & Right  . THORACENTESIS     for peripneumonic effusion  . TOTAL ABDOMINAL HYSTERECTOMY W/ BILATERAL SALPINGOOPHORECTOMY     For Fibroids & endometriosis  . UMBILICAL HERNIA REPAIR N/A 03/18/2017   Procedure: OPEN HERNIA REPAIR UMBILICAL ADULT;  Surgeon: Leighton Ruff, MD;  Location: [facility];  Service: General;  Laterality: N/A;  ERAs pathway     Home Medications:  Prior to Admission medications   Medication Sig Start Date End Date Taking? Authorizing Provider  acetaminophen (TYLENOL) 500 MG tablet Take 1,000 mg by mouth at bedtime as needed for moderate pain.   Yes [provider]  aspirin EC 81 MG tablet Take 81 mg by mouth at bedtime.   Yes [provider]  augmented betamethasone dipropionate (DIPROLENE-AF) 0.05 % ointment Apply 1 application topically daily. For vasculitis and psoriasis   Yes [provider]  CALCIUM PO Take 1,000 mg by mouth at bedtime.   Yes [provider]  cholecalciferol (VITAMIN D) 1000 units tablet Take 1,000 Units by mouth at bedtime.   Yes [provider]  folic acid (FOLVITE) 1 MG tablet TAKE 2 TABLETS BY MOUTH ONCE DAILY Patient taking differently: TAKE 2 TABLETS BY MOUTH ONCE DAILY AT NIGHT 05/25/16  Yes Deveshwar, Abel Presto, MD  furosemide (LASIX) 40 MG tablet Take 1 tablet (40 mg total) by mouth daily. 05/21/16  Yes Burns, Claudina Lick, MD  losartan (COZAAR) 100 MG tablet Take 1 tablet (100 mg total) by mouth daily. 08/30/16  Yes Burns, Claudina Lick, MD  Multiple Vitamin (MULTIVITAMIN WITH MINERALS) TABS tablet Take 1 tablet by mouth at bedtime.   Yes [provider]  predniSONE (DELTASONE) 5 MG tablet TAKE 1 TABLET BY MOUTH EVERY DAY WITH BREAKFAST AND WITH 2 PREDNISONE 1MG  TABLETS Patient taking differently: TAKE 1  TABLET BY MOUTH EVERY DAY WITH BREAKFAST 11/29/16  Yes Deveshwar, Abel Presto, MD  pyridostigmine (MESTINON) 60 MG tablet Take 1 tablet (60 mg total) by mouth 3 (three) times daily. 08/09/16  Yes Patel, Donika K, DO  pyridOXINE (VITAMIN B-6) 100 MG tablet Take 100 mg by mouth at bedtime.   Yes [provider]  spironolactone (ALDACTONE) 25 MG tablet TAKE 1 TABLET BY MOUTH EVERY DAY 11/15/16  Yes Skeet Latch, MD  traZODone (DESYREL) 50 MG tablet TAKE 1 TO 2 TABLETS BY MOUTH EVERY DAY AT BEDTIME AS NEEDED FOR SLEEP Patient taking differently: Take 50 mgs by mouth at bedtime may take an additional 50 mg later if still awake 02/14/17  Yes Burns, Claudina Lick, MD  vitamin B-12 (CYANOCOBALAMIN) 1000 MCG tablet Take 1,000 mcg by mouth at bedtime.   Yes [provider]  acetaminophen (TYLENOL) 325 MG tablet Take 2 tablets (650 mg total) by mouth every 6 (six) hours as needed for mild pain (  or temp > 100). Patient not taking: Reported on 03/02/2017 10/14/16   Jill Alexanders, PA-C  LORazepam (ATIVAN) 0.5 MG tablet Take 1 tablet (0.5 mg total) by mouth at bedtime. Patient not taking: Reported on 02/09/2017 11/01/16   [name], MD  metroNIDAZOLE (FLAGYL) 500 MG tablet Take 1 tablet (500 mg total) by mouth 2 (two) times daily. Patient not taking: Reported on 03/02/2017 01/14/17   Yetta Flock, MD  predniSONE (DELTASONE) 1 MG tablet TAKE 2 TABLETS (2 MG TOTAL) BY MOUTH DAILY WITH BREAKFAST. TAKE WITH A 5 MG TABLET FOR A 7 MG DOSE Patient not taking: Reported on 02/09/2017 12/15/16   Bo Merino, MD  sodium chloride flush (NS) 0.9 % SOLN Place 10 mLs into feeding tube daily. Into percutaneous/abdominal tube daily. 10/14/16   Jill Alexanders, PA-C  traMADol (ULTRAM) 50 MG tablet Take 1 tablet (50 mg total) by mouth every 12 (twelve) hours as needed (mild pain). Patient not taking: Reported on 01/21/2017 10/14/16   Jill Alexanders, PA-C    Inpatient Medications: Scheduled Meds: .  famotidine  20 mg Oral BID  . feeding supplement  1 Container Oral TID BM  . losartan  100 mg Oral Daily  . methocarbamol  1,000 mg Oral QID  . predniSONE  5 mg Oral Q breakfast  . pyridostigmine  60 mg Oral TID  . saccharomyces boulardii  250 mg Oral BID  . spironolactone  25 mg Oral Daily   Continuous Infusions: . amiodarone 60 mg/hr (03/24/17 1220)   Followed by  . amiodarone    . dextrose 5 % and 0.45 % NaCl with KCl 20 mEq/L 10 mL/hr at 03/24/17 0600  . heparin 1,150 Units/hr (03/24/17 1342)  . sodium chloride 500 mL (03/19/17 1815)   PRN Meds: alum & mag hydroxide-simeth, calcium carbonate, diphenhydrAMINE **OR** diphenhydrAMINE, HYDROmorphone (DILAUDID) injection, ondansetron **OR** ondansetron (ZOFRAN) IV, oxyCODONE, promethazine  Allergies:    Allergies  Allergen Reactions  . Azithromycin Other (See Comments)    Angioedema  . Ibuprofen     800 mg - edema all over, including lips and tongue  . Levaquin [Levofloxacin In D5w] Other (See Comments)    D/T MYASTHENIA GRAVIS  . Tetracycline Rash  . Isovue [Iopamidol] Dermatitis    Pt had a rash and blisters on bilat feet and lower part of legs.  Full premeds in the future.  This happened twice after IV contrast.   J Bohm  RTRCT  . Sulfonamide Derivatives     Seizure age 73 (Note: probably fever induced)  . Aleve [Naproxen] Other (See Comments)    Caused excessive salivation, throat tightness  . Tape Other (See Comments)    Causes blisters  Paper tape only  . Zosyn [Piperacillin Sod-Tazobactam So]     Severe leg rash, skin peeling  . Amlodipine Besy-Benazepril Hcl Cough  . Amoxicillin Swelling and Rash    Has patient had a PCN reaction causing immediate rash, facial/tongue/throat swelling, SOB or lightheadedness with hypotension: No Has patient had a PCN reaction causing severe rash involving mucus membranes or skin necrosis: No Has patient had a PCN reaction that required hospitalization: No Has patient had a PCN  reaction occurring within the last 10 years: Yes If all of the above answers are "NO", then may proceed with Cephalosporin use.     Social History:   Social History   Socioeconomic History  . Marital status: Married    Spouse name: Not on file  . Number  of children: Not on file  . Years of education: Not on file  . Highest education level: Not on file  Social Needs  . Financial resource strain: Not on file  . Food insecurity - worry: Not on file  . Food insecurity - inability: Not on file  . Transportation needs - medical: Not on file  . Transportation needs - non-medical: Not on file  Occupational History  . 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 Smoking status: Never Smoker  . Smokeless tobacco: Never Used  Substance and Sexual Activity  . Alcohol use: No    Alcohol/week: 0.0 oz  . Drug use: No  . Sexual activity: Not Currently  Other Topics Concern  . Not on file  Social History Narrative   Lives with husband in a one story home.  Had one child that only lived for 14 hours.     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          Epworth Sleepiness Scale = 2 (as of 03/14/2015)    Family History:    Family History  Problem Relation Age of Onset  . Heart attack Mother 51  . Heart failure Mother   . Stroke Paternal Grandmother        in 69s  . Lung cancer Father   . Cancer Father        lung  . Hypertension Sister   . Hyperthyroidism Sister        Graves  . Hyperthyroidism Sister        Graves  . Diabetes Neg Hx   . Colon cancer Neg Hx   . Esophageal cancer Neg Hx   . Stomach cancer Neg Hx   . Rectal cancer Neg Hx      ROS:  Please see the history of present illness.   All other ROS reviewed and negative.     Physical Exam/Data:   Vitals:   03/24/17 1150 03/24/17 1200 03/24/17 1220 03/24/17 1230  BP: 138/80  (!) 93/29 131/83  Pulse: (!) 122 (!) 113 (!) 137 (!) 116  Resp: 14 15 14  11   Temp:  97.8 F (36.6 C)    TempSrc:  Oral    SpO2: 100% 99% 100% 97%  Weight:      Height:        Intake/Output Summary (Last 24 hours) at 03/24/2017 1412 Last data filed at 03/24/2017 0600 Gross per 24 hour  Intake 210 ml  Output 450 ml  Net -240 ml   Filed Weights   03/22/17 0529 03/23/17 1320 03/24/17 0551  Weight: 263 lb 10.7 oz (119.6 kg) 259 lb 4.2 oz (117.6 kg) 262 lb 3.2 oz (118.9 kg)   Body mass index is 47.96 kg/m.  General:  Well nourished, well developed, in no acute distress HEENT: normal Neck: no JVD, exam difficult Vascular: No carotid bruits Cardiac:  Irregular rhythm, tachycardic rate Lungs:  clear to auscultation bilaterally, no wheezing, rhonchi or rales  Abd: soft, nontender, no hepatomegaly  Ext: LE edema, lymphedema, chronic skin changes Musculoskeletal:  No deformities, BUE and BLE strength normal and equal Skin: warm and dry  Neuro:  CNs 2-12 intact, no focal abnormalities noted Psych:  Normal affect   EKG:  The EKG was personally reviewed and demonstrates:  Afib RVR, HR in the 160s Telemetry:  Telemetry was personally reviewed and demonstrates:  Afib 130s  Relevant CV Studies:  Echo 05/26/15: Study Conclusions  - Left ventricle: The cavity size was mildly dilated. Systolic function was normal. The estimated ejection fraction was in the range of 60% to 65%. Wall motion was normal; there were no regional wall motion abnormalities. Left ventricular diastolic function parameters were normal. - Aortic valve: Transvalvular velocity was within the normal range. There was no stenosis. There was no regurgitation. - Mitral valve: There was no regurgitation. - Left atrium: The atrium was mildly dilated. - Right ventricle: The cavity size was normal. Wall thickness was normal. Systolic function was normal. - Tricuspid valve: There was no regurgitation. - Inferior vena cava: The vessel was normal in size. The respirophasic diameter  changes were in the normal range (= 50%), consistent with normal central venous pressure.   28 Day Event Monitor 04/01/15:  Quality: Fair.  Baseline artifact. Predominant rhythm: sinus rhythm Average heart rate: 70 bpm Max heart rate: 126 bpm Min heart rate: 50 bpm  Short runs of atrial tachycardia lasting up to 11 seconds.  One 6 beat run of NSVT.    Lexiscan Cardiolite 03/28/15: 1. Nuclear stress EF: 71%. 2. The left ventricular ejection fraction is hyperdynamic (>65%). 3. There was no ST segment deviation noted during stress. 4. This is an intermediate risk study. 5. Findings consistent with ischemia.  Technically difficult study due to increased subdiaphragmatic activity; intermediate risk with small, severe, reversible defects in the apical and inferior basal walls consistent with ischemia; EF 71 with normal wall motion.   LHC 04/17/15: The left ventricular systolic function is normal. Angiographically normal coronary arteries, but very tortuous  Angiographically no evidence of any significant lesions to explain the patient's abnormal stress test. There does appear to be mild calcification in the LAD distribution, but certainly not on the luminal aspect.   Laboratory Data:  Chemistry Recent Labs  Lab 03/19/17 0428 03/20/17 0508 03/21/17 0442  NA 135 137 139  K 4.1 4.0 4.3  CL 101 106 107  CO2 25 23 28   GLUCOSE 161* 90 98  BUN 17 14 10   CREATININE 2.06* 1.18* 0.83  CALCIUM 8.0* 7.6* 7.7*  GFRNONAA 23* 46* >60  GFRAA 27* 53* >60  ANIONGAP 9 8 4*    No results for input(s): PROT, ALBUMIN, AST, ALT, ALKPHOS, BILITOT in the last 168 hours. Hematology Recent Labs  Lab 03/20/17 0508 03/21/17 0442 03/22/17 0747  WBC 15.6* 10.7* 11.5*  RBC 2.77* 2.56* 3.24*  HGB 7.7* 7.2* 8.7*  HCT 24.2* 22.7* 29.0*  MCV 87.4 88.7 89.5  MCH 27.8 28.1 26.9  MCHC 31.8 31.7 30.0  RDW 15.3 15.3 15.2  PLT 343 298 473*   Cardiac Enzymes Recent Labs  Lab  03/19/17 2247 03/24/17 1016  TROPONINI <0.03 <0.03   No results for input(s): TROPIPOC in the last 168 hours.  BNPNo results for input(s): BNP, PROBNP in the last 168 hours.  DDimer No results for input(s): DDIMER in the last 168 hours.  Radiology/Studies:  No results found.  Assessment and Plan:   1. Atrial fibrillation with RVR By my review, she does not have a history of Afib. 30-day event monitor showed atrial tachycardia. She reports occasional palpitations that self-resolve. She is not on AV nodal agents due to myasthenia gravis. She was given OTO 2.5 mg IV lopressor which reduced her rate to 130s. We will avoid further AV nodal agents for now and will start amiodarone IV bolus with infusion. Per surgery team, OK to start heparin drip.   This patients CHA2DS2-VASc  Score and unadjusted Ischemic Stroke Rate (% per year) is equal to 3.2 % stroke rate/year from a score of 3 (HTN, female, age).    2. HTN Pt is tolerating afib RVR. Will hold hypertensive agents for now until her rhythm is stabilized.   3. Lymphedema Holding lasix for now in the setting of recent surgery. Will stabilize rhythm first before restarting lasix. Echo pending will help guide diuretic regimen.   For questions or updates, please contact [place] Please consult [email or web address] for contact info under Cardiology/STEMI.   Signed, Minus Breeding, MD  03/24/2017 2:12 PM   History and all data above reviewed.  Patient examined.  I agree with the findings as above.  The patient had sudden onset of atrial fib as above.  No indication that she has had this previously.  She woke up feeling anxious.  She has had palpitations mostly at night as above.  However, prior to having her recent problems (as recently as last fall) she was doing well at home without any acute cardiac complaints.  More recently she has been getting around at home with a walker.  She does not have chest pain.  She does not have PND or orthopnea.   She has had no syncope or presyncope.    The patient exam reveals SEG:[id number]  ,  Lungs: Clear  ,  Abd: Positive bowel sounds, no rebound no guarding, Ext No edema  .  All available labs, radiology testing, previous records reviewed. Agree with documented assessment and plan. Atrial fib:  For now control rate with IV amiodarone.  She is on heparin.  If she does not convert by the AM we will need to decide on further strategies of rate vs rhythm control.  Echo pending.   Jeneen Rinks Joani Cosma  2:13 PM  03/24/2017

## 2017-03-24 NOTE — Progress Notes (Signed)
ANTICOAGULATION CONSULT NOTE - Initial Consult  Pharmacy Consult for heparin Indication: atrial fibrillation  Allergies  Allergen Reactions  . Azithromycin Other (See Comments)    Angioedema  . Ibuprofen     800 mg - edema all over, including lips and tongue  . Levaquin [Levofloxacin In D5w] Other (See Comments)    D/T MYASTHENIA GRAVIS  . Tetracycline Rash  . Isovue [Iopamidol] Dermatitis    Pt had a rash and blisters on bilat feet and lower part of legs.  Full premeds in the future.  This happened twice after IV contrast.   J Bohm  RTRCT  . Sulfonamide Derivatives     Seizure age 33 (Note: probably fever induced)  . Aleve [Naproxen] Other (See Comments)    Caused excessive salivation, throat tightness  . Tape Other (See Comments)    Causes blisters  Paper tape only  . Zosyn [Piperacillin Sod-Tazobactam So]     Severe leg rash, skin peeling  . Amlodipine Besy-Benazepril Hcl Cough  . Amoxicillin Swelling and Rash    Has patient had a PCN reaction causing immediate rash, facial/tongue/throat swelling, SOB or lightheadedness with hypotension: No Has patient had a PCN reaction causing severe rash involving mucus membranes or skin necrosis: No Has patient had a PCN reaction that required hospitalization: No Has patient had a PCN reaction occurring within the last 10 years: Yes If all of the above answers are "NO", then may proceed with Cephalosporin use.     Patient Measurements: Height: 5\' 2"  (157.5 cm) Weight: 262 lb 3.2 oz (118.9 kg) IBW/kg (Calculated) : 50.1 Heparin Dosing Weight: 79 kg  Vital Signs: Temp: 98.9 F (37.2 C) (03/14 0923) Temp Source: Oral (03/14 0923) BP: 127/71 (03/14 1100) Pulse Rate: 127 (03/14 1100)  Labs: Recent Labs    03/22/17 0747 03/24/17 1016  HGB 8.7*  --   HCT 29.0*  --   PLT 473*  --   TROPONINI  --  <0.03    Estimated Creatinine Clearance: 77.3 mL/min (by C-G formula based on SCr of 0.83 mg/dL).   Medical History: Past  Medical History:  Diagnosis Date  . Allergy   . Aortic atherosclerosis ([place])   . Atrial tachycardia ([place]) 05/12/2015  . Bilateral leg edema   . Cataract   . Colovesical fistula   . Diplopia   . Diverticulitis   . Diverticulosis   . DJD (degenerative joint disease), cervical   . Family history of ischemic heart disease   . Frequent UTI   . GERD (gastroesophageal reflux disease)   . Heart murmur   . History of hiatal hernia    Small hernia  . Hypertension   . Internal hemorrhoids 01/21/2017   noted on colonopscopy  . Knee pain, bilateral   . Leukocytoclastic vasculitis ([place])   . Myalgia and myositis, unspecified   . Myasthenia gravis without exacerbation (HCC)    [facility], Dr Anna Genre  . NSVT (nonsustained ventricular tachycardia) ([place]) 05/12/2015  . Osteoarthritis    Both knees  . Osteopenia   . Other and unspecified hyperlipidemia   . Periumbilical hernia   . Pneumonia 04/2016  . PONV (postoperative nausea and vomiting)   . Psoriasis    plantar ;Dr Jarome Matin  . Shoulder pain, bilateral   . Sleep difficulties   . Unspecified vitamin D deficiency    Dr Estanislado Pandy  . Vasculitis ([place])    Dr Estanislado Pandy  . Vitamin D deficiency     Assessment: 72 YOF developed atrial fibrillation with  RVR s/p open sigmoidectomy, umbilical hernia repair on 03/18/17.  Pharmacy asked to dose heparin gtt.   Today, 03/24/2017  CBC: Hgb decreased but improved on 3/12 labs. pltc elevated at that time as well  Renal: AKI resolved  Goal of Therapy:  Heparin level 0.3-0.7 units/ml Monitor platelets by anticoagulation protocol: Yes   Plan:   No heparin bolus for recent surgery (< 7 days ago) and recent enoxaparin 40mg  SQ dose  D/C enoxaparin order  Start Heparin 1150 units/hr  Check 8h heparin level  Daily heparin level and CBC  Doreene Eland, PharmD, BCPS.   Pager: [phone] 03/24/2017 11:32 AM

## 2017-03-24 NOTE — Progress Notes (Signed)
HR remains elevated in 160's with BP WNL. EKG resulting. Rapid Response team called. EKG in progress. Dr. Marcello Moores notified via phone of episode of tachycardia and pt status. Order received to get troponin level.

## 2017-03-24 NOTE — Progress Notes (Signed)
Rapid Response Event Note  Overview: called to room at 0940 for tachycardia.       Initial Focused Assessment: When arrived in room, patient in chair position. Patient reports feeling "just so tired". Patient placed on 2 L [place]. See VS flowsheet for VS obtained. 12 lead EKG already obtained and placed on patients chart at time of RRT. Attending MD notified and new orders placed. [facility] PA arrived at bedside. Patients HR afib ranging from 140-180s with exertion. Total of 5 mg lopressor given IV per verbal order from [facility] PA.    Plan of Care (if not transferred): transfer patient to [facility] SDU and order placed to consult cardiology. When arrived in SDU room, cardiology MD at bedside, new orders received.       Jaynie Bream

## 2017-03-24 NOTE — Progress Notes (Signed)
6 Days Post-Op open sigmoidectomy Subjective:  back pain somewhat better, some nausea with meals but also better.  Abd pain controlled.  BP stable.  Passing flatus and having BM's.  Tolerating some soft foods Objective: Vital signs in last 24 hours: Temp:  [98.4 F (36.9 C)-99.5 F (37.5 C)] 98.4 F (36.9 C) (03/14 0551) Pulse Rate:  [71-75] 72 (03/14 0551) Resp:  [18] 18 (03/14 0551) BP: (125-132)/(43-48) 127/48 (03/14 0551) SpO2:  [99 %-100 %] 100 % (03/14 0551) Weight:  [117.6 kg (259 lb 4.2 oz)-118.9 kg (262 lb 3.2 oz)] 118.9 kg (262 lb 3.2 oz) (03/14 0551)   Intake/Output from previous day: 03/13 0701 - 03/14 0700 In: 470 [P.O.:240; I.V.:230] Out: 450 [Drains:450] Intake/Output this shift: No intake/output data recorded.   General appearance: alert and cooperative GI: normal findings: soft, appropriately tender  Incision:  Clean, dry, intact  Lab Results:  Recent Labs    03/22/17 0747  WBC 11.5*  HGB 8.7*  HCT 29.0*  PLT 473*   BMET No results for input(s): NA, K, CL, CO2, GLUCOSE, BUN, CREATININE, CALCIUM in the last 72 hours. PT/INR No results for input(s): LABPROT, INR in the last 72 hours. ABG No results for input(s): PHART, HCO3 in the last 72 hours.  Invalid input(s): PCO2, PO2  MEDS, Scheduled . enoxaparin (LOVENOX) injection  40 mg Subcutaneous Q24H  . feeding supplement  1 Container Oral TID BM  . losartan  100 mg Oral Daily  . methocarbamol  1,000 mg Oral QID  . predniSONE  5 mg Oral Q breakfast  . pyridostigmine  60 mg Oral TID  . saccharomyces boulardii  250 mg Oral BID  . spironolactone  25 mg Oral Daily    Studies/Results: No results found.  Assessment: s/p Procedure(s): OPEN SIGMOIDECTOMY OPEN HERNIA REPAIR UMBILICAL ADULT CYSTOSCOPY WITH RETROGRADE AND BILATERAL URETERAL STENT PLACEMENT Patient Active Problem List   Diagnosis Date Noted  . Diverticular stricture ([place]) 03/18/2017  . Skin erythema 11/01/2016  . Sleep  difficulties 10/31/2016  . Pelvic abscess in female 10/11/2016  . Bilateral leg edema 08/20/2016  . Recurrent UTI 08/15/2016  . Psoriasis 04/13/2016  . History of diverticulitis 04/13/2016  . Osteopenia of multiple sites 04/13/2016  . DJD (degenerative joint disease), cervical 04/07/2016  . Primary osteoarthritis of both knees 04/07/2016  . Decreased abduction of right shoulder joint 04/07/2016  . Atrial tachycardia ([place]) 05/12/2015  . NSVT (nonsustained ventricular tachycardia) ([place]) 05/12/2015  . Abnormal nuclear stress test - INTERMEDIATE RISK 04/14/2015  . Coronary artery calcification seen on CAT scan 02/28/2015  . Left lower quadrant pain 02/17/2015  . Hyperglycemia 08/09/2014  . Dyspnea 03/20/2013  . Hypokalemia 12/31/2012  . Umbilical hernia 09/98/3382  . Pleural effusion 05/10/2012  . Myasthenia gravis without exacerbation ([place]) 02/12/2010  . DIPLOPIA 11/21/2009  . Vitamin D deficiency 06/25/2008  . Hyperlipidemia 06/25/2008  . Essential hypertension 06/25/2008  . Leukocytoclastic vasculitis ([place]) 06/19/2007  . MUSCLE WEAKNESS (GENERALIZED) 02/10/2007  . Unspecified Myalgia and Myositis 01/24/2007  . EDEMA- LOCALIZED 01/24/2007  . ANA POSITIVE, HX OF 01/24/2007    Expected post op course  Plan: Acute blood loss anemia: Hgb stable.  HD stable.   Acute Renal Insufficiency: resolved with IV fluids, foley out, urinating well Hold lasix today, hold BP meds if SBP<120 Cont soft diet as tolerated Cont Breeze supplement between meals today OOB and ambulating in hall  PO pain meds with antiemetic premed   LOS: 6 days     .Gifford Shave  Marcello Moores, [facility], [place] [phone]   03/24/2017 8:32 AM

## 2017-03-24 NOTE — Progress Notes (Signed)
OT Cancellation Note  Patient Details Name: Kimberly [name] MRN: [id number] DOB: [birth year]   Cancelled Treatment:    Reason Eval/Treat Not Completed: Medical issues which prohibited therapy. Pt transferred to ICU/SDU for A-Fib. Will check back another day.  Shar Paez 03/24/2017, 12:46 PM  Lesle Chris, OTR/L [phone] 03/24/2017

## 2017-03-24 NOTE — Progress Notes (Signed)
Rapid Response nurse, Nelva Bush, RN, at bedside. Monitor applied. No distress noted. Reassurance given and husband at bedside. BP 104/80 with HR 92 fluctuating back up into 120's- 130's. Pt denies chest pain or discomfort.

## 2017-03-24 NOTE — Progress Notes (Signed)
  Echocardiogram 2D Echocardiogram has been performed.  Darlina Sicilian M 03/24/2017, 12:53 PM

## 2017-03-24 NOTE — Progress Notes (Signed)
6 Days Post-Op    CC:  tachycardia  Subjective: Called for tachycardia, EKG, shows AF.  HR 161, up to 180 standing. No chest pain. On O2 Saturations 100%BP 143/92 and we gave a total of 5 mg IV lopressor on the floor.  Transfer to the step down ICU  HR down to 130's.  Still if AF, Cardiology is here to help  Objective: Vital signs in last 24 hours: Temp:  [98.4 F (36.9 C)-99.5 F (37.5 C)] 98.9 F (37.2 C) (03/14 0923) Pulse Rate:  [71-161] 156 (03/14 0953) Resp:  [12-18] 12 (03/14 0953) BP: (105-170)/(43-149) 130/46 (03/14 0953) SpO2:  [98 %-100 %] 100 % (03/14 0953) Weight:  [117.6 kg (259 lb 4.2 oz)-118.9 kg (262 lb 3.2 oz)] 118.9 kg (262 lb 3.2 oz) (03/14 0551) Last BM Date: 03/22/17  Intake/Output from previous day: 03/13 0701 - 03/14 0700 In: 470 [P.O.:240; I.V.:230] Out: 450 [Drains:450] Intake/Output this shift: No intake/output data recorded.  General appearance: alert, cooperative and anxious, trouble with standing, but did well. Sats remain good on O2. Resp: clear to auscultation bilaterally Cardio: AF HR top 180 down to 130's now.  No chest pain, complains of back pain  GI: soft, few BS, she has a midline incision with staples, some serous drainage from the lower portion of the incision  Extremities: extremities normal, atraumatic,trace at most edema, some psoriasis both lower legs  Lab Results:  Recent Labs    03/22/17 0747  WBC 11.5*  HGB 8.7*  HCT 29.0*  PLT 473*    BMET No results for input(s): NA, K, CL, CO2, GLUCOSE, BUN, CREATININE, CALCIUM in the last 72 hours. PT/INR No results for input(s): LABPROT, INR in the last 72 hours.  No results for input(s): AST, ALT, ALKPHOS, BILITOT, PROT, ALBUMIN in the last 168 hours.   Lipase     Component Value Date/Time   LIPASE 31 04/07/2015 2254     Prior to Admission medications   Medication Sig Start Date End Date Taking? Authorizing Provider  acetaminophen (TYLENOL) 500 MG tablet Take 1,000 mg by  mouth at bedtime as needed for moderate pain.   Yes [provider]  aspirin EC 81 MG tablet Take 81 mg by mouth at bedtime.   Yes [provider]  augmented betamethasone dipropionate (DIPROLENE-AF) 0.05 % ointment Apply 1 application topically daily. For vasculitis and psoriasis   Yes [provider]  CALCIUM PO Take 1,000 mg by mouth at bedtime.   Yes [provider]  cholecalciferol (VITAMIN D) 1000 units tablet Take 1,000 Units by mouth at bedtime.   Yes [provider]  folic acid (FOLVITE) 1 MG tablet TAKE 2 TABLETS BY MOUTH ONCE DAILY Patient taking differently: TAKE 2 TABLETS BY MOUTH ONCE DAILY AT NIGHT 05/25/16  Yes Deveshwar, Abel Presto, MD  furosemide (LASIX) 40 MG tablet Take 1 tablet (40 mg total) by mouth daily. 05/21/16  Yes Burns, Claudina Lick, MD  losartan (COZAAR) 100 MG tablet Take 1 tablet (100 mg total) by mouth daily. 08/30/16  Yes Burns, Claudina Lick, MD  Multiple Vitamin (MULTIVITAMIN WITH MINERALS) TABS tablet Take 1 tablet by mouth at bedtime.   Yes [provider]  predniSONE (DELTASONE) 5 MG tablet TAKE 1 TABLET BY MOUTH EVERY DAY WITH BREAKFAST AND WITH 2 PREDNISONE 1MG  TABLETS Patient taking differently: TAKE 1 TABLET BY MOUTH EVERY DAY WITH BREAKFAST 11/29/16  Yes Deveshwar, Abel Presto, MD  pyridostigmine (MESTINON) 60 MG tablet Take 1 tablet (60 mg total) by mouth  3 (three) times daily. 08/09/16  Yes Patel, Donika K, DO  pyridOXINE (VITAMIN B-6) 100 MG tablet Take 100 mg by mouth at bedtime.   Yes [provider]  spironolactone (ALDACTONE) 25 MG tablet TAKE 1 TABLET BY MOUTH EVERY DAY 11/15/16  Yes Skeet Latch, MD  traZODone (DESYREL) 50 MG tablet TAKE 1 TO 2 TABLETS BY MOUTH EVERY DAY AT BEDTIME AS NEEDED FOR SLEEP Patient taking differently: Take 50 mgs by mouth at bedtime may take an additional 50 mg later if still awake 02/14/17  Yes Burns, Claudina Lick, MD  vitamin B-12 (CYANOCOBALAMIN) 1000 MCG tablet Take 1,000 mcg by  mouth at bedtime.   Yes [provider]  acetaminophen (TYLENOL) 325 MG tablet Take 2 tablets (650 mg total) by mouth every 6 (six) hours as needed for mild pain (or temp > 100). Patient not taking: Reported on 03/02/2017 10/14/16   Jill Alexanders, PA-C  LORazepam (ATIVAN) 0.5 MG tablet Take 1 tablet (0.5 mg total) by mouth at bedtime. Patient not taking: Reported on 02/09/2017 11/01/16   [name], MD  metroNIDAZOLE (FLAGYL) 500 MG tablet Take 1 tablet (500 mg total) by mouth 2 (two) times daily. Patient not taking: Reported on 03/02/2017 01/14/17   Yetta Flock, MD  predniSONE (DELTASONE) 1 MG tablet TAKE 2 TABLETS (2 MG TOTAL) BY MOUTH DAILY WITH BREAKFAST. TAKE WITH A 5 MG TABLET FOR A 7 MG DOSE Patient not taking: Reported on 02/09/2017 12/15/16   Bo Merino, MD  sodium chloride flush (NS) 0.9 % SOLN Place 10 mLs into feeding tube daily. Into percutaneous/abdominal tube daily. 10/14/16   Jill Alexanders, PA-C  traMADol (ULTRAM) 50 MG tablet Take 1 tablet (50 mg total) by mouth every 12 (twelve) hours as needed (mild pain). Patient not taking: Reported on 01/21/2017 10/14/16   Jill Alexanders, PA-C    Medications: . enoxaparin (LOVENOX) injection  40 mg Subcutaneous Q24H  . feeding supplement  1 Container Oral TID BM  . losartan  100 mg Oral Daily  . methocarbamol  1,000 mg Oral QID  . predniSONE  5 mg Oral Q breakfast  . pyridostigmine  60 mg Oral TID  . saccharomyces boulardii  250 mg Oral BID  . spironolactone  25 mg Oral Daily   . dextrose 5 % and 0.45 % NaCl with KCl 20 mEq/L 10 mL/hr at 03/24/17 0600  . sodium chloride 500 mL (03/19/17 1815)   Anti-infectives (From admission, onward)   Start     Dose/Rate Route Frequency Ordered Stop   03/18/17 1600  clindamycin (CLEOCIN) IVPB 900 mg     900 mg 100 mL/hr over 30 Minutes Intravenous Every 8 hours 03/18/17 1253 03/18/17 1839   03/18/17 0600  clindamycin (CLEOCIN) IVPB 900 mg     900 mg 100  mL/hr over 30 Minutes Intravenous On call to O.R. 03/18/17 0535 03/18/17 0749   03/18/17 0600  gentamicin (GARAMYCIN) 400 mg in dextrose 5 % 100 mL IVPB     5 mg/kg  79.9 kg (Adjusted) 110 mL/hr over 60 Minutes Intravenous On call to O.R. 03/18/17 0535 03/18/17 0805      Assessment/Plan Tachycardia - AF HR up to 160's  Cardiology is here with pt now. Hx of NSVT Myasthenia gravis Hypertension DJD Psoriasis  Body mass index is 47.9  Colovesicular fistula OPEN SIGMOIDECTOMY, HERNIA REPAIR UMBILICAL ADULT , CYSTOSCOPY WITH RETROGRADE AND BILATERAL URETERAL STENT PLACEMENT 03/18/17, Dr. Marcello Moores POD 5  FEN:IV fluids/soft diet ID:pre  op DVT:  Lovenox   Plan:  IN stepdown ICU, cardiology on board. She has had 5 mg of IV lopressor.  HR down to 130 range.  Cardiology discussing next agent.  She is far enough out that she can have heparin if that is needed.          LOS: 6 days    Kimberly [name] 03/24/2017 [phone]

## 2017-03-24 NOTE — Progress Notes (Signed)
Modena Jansky, PA at bedside.HR elevated again into 150's-160's. Will ordered IV Lopressor. Pt continued to be monitored by Nelva Bush, RN- see rapid response notes and VS.

## 2017-03-24 NOTE — Progress Notes (Signed)
PT Cancellation Note  Patient Details Name: Kimberly [name] MRN: [id number] DOB: [birth year]   Cancelled Treatment:     Pt transferred to step down due to A Fib.  Will check back later as schedule permits.    Rica Koyanagi  PTA [facility]  Acute  Rehab Pager      [phone]

## 2017-03-24 NOTE — Progress Notes (Signed)
Back to bed. Pt transferred via bed to ICU stepdown accompanied by Modena Jansky, PA, primary nurse, and rapid response nurse, Junie Panning. Continues to deny chest pain, continued reassurance given by the team, and VS monitored. Pt transferred to bed #1238.

## 2017-03-25 ENCOUNTER — Telehealth: Payer: Self-pay | Admitting: Cardiovascular Disease

## 2017-03-25 DIAGNOSIS — I480 Paroxysmal atrial fibrillation: Secondary | ICD-10-CM

## 2017-03-25 LAB — CBC
HCT: 26.9 % — ABNORMAL LOW (ref 36.0–46.0)
HEMOGLOBIN: 8.4 g/dL — AB (ref 12.0–15.0)
MCH: 27.4 pg (ref 26.0–34.0)
MCHC: 31.2 g/dL (ref 30.0–36.0)
MCV: 87.6 fL (ref 78.0–100.0)
PLATELETS: 401 10*3/uL — AB (ref 150–400)
RBC: 3.07 MIL/uL — ABNORMAL LOW (ref 3.87–5.11)
RDW: 15.1 % (ref 11.5–15.5)
WBC: 10.7 10*3/uL — ABNORMAL HIGH (ref 4.0–10.5)

## 2017-03-25 LAB — HEPARIN LEVEL (UNFRACTIONATED): HEPARIN UNFRACTIONATED: 0.35 [IU]/mL (ref 0.30–0.70)

## 2017-03-25 MED ORDER — AMIODARONE HCL IN DEXTROSE 360-4.14 MG/200ML-% IV SOLN: 30.0000 mg/h | INTRAVENOUS | Status: AC

## 2017-03-25 MED ORDER — AMIODARONE HCL 200 MG PO TABS
200.0000 mg | ORAL_TABLET | Freq: Two times a day (BID) | ORAL | Status: DC
  Administered 2017-03-25 – 2017-03-26 (×3): 200 mg via ORAL
  Filled 2017-03-25 (×3): qty 1

## 2017-03-25 MED ORDER — CALCIUM CARBONATE ANTACID 500 MG PO CHEW: 1.0000 | CHEWABLE_TABLET | Freq: Three times a day (TID) | ORAL | Status: DC | PRN

## 2017-03-25 MED ORDER — APIXABAN 5 MG PO TABS
5.0000 mg | ORAL_TABLET | Freq: Two times a day (BID) | ORAL | Status: DC
  Administered 2017-03-25 – 2017-03-26 (×3): 5 mg via ORAL
  Filled 2017-03-25 (×3): qty 1

## 2017-03-25 NOTE — Progress Notes (Signed)
OT Cancellation Note  Patient Details Name: JAMERIAH TROTTI MRN: [id number] DOB: 06-Jul-1945   Cancelled Treatment:    Reason Eval/Treat Not Completed: Other (comment). Pt has company and would like OT to return later; told her I cannot come back today, but we will return as soon as we can.  Dareth Andrew 03/25/2017, 3:06 PM  Lesle Chris, OTR/L [phone] 03/25/2017

## 2017-03-25 NOTE — Progress Notes (Signed)
7 Days Post-Op open sigmoidectomy Subjective:  Pain improving. Tolerating a diet.  Passing flatus and having BM's.  Had an episode of A fib with RVR yesterday. Objective: Vital signs in last 24 hours: Temp:  [97.8 F (36.6 C)-98.9 F (37.2 C)] 98.7 F (37.1 C) (03/15 0744) Pulse Rate:  [57-161] 57 (03/15 0600) Resp:  [11-20] 14 (03/15 0600) BP: (93-170)/(29-149) 93/35 (03/15 0600) SpO2:  [85 %-100 %] 92 % (03/15 0600) Weight:  [124.8 kg (275 lb 2.2 oz)] 124.8 kg (275 lb 2.2 oz) (03/15 0400)   Intake/Output from previous day: 03/14 0701 - 03/15 0700 In: 9170 [I.V.:1170; IV Piggyback:8000] Out: -  Intake/Output this shift: No intake/output data recorded.   General appearance: alert and cooperative GI: normal findings: soft, appropriately tender  Incision:  Clean, dry, intact  Lab Results:  Recent Labs    03/25/17 0332  WBC 10.7*  HGB 8.4*  HCT 26.9*  PLT 401*   BMET No results for input(s): NA, K, CL, CO2, GLUCOSE, BUN, CREATININE, CALCIUM in the last 72 hours. PT/INR No results for input(s): LABPROT, INR in the last 72 hours. ABG No results for input(s): PHART, HCO3 in the last 72 hours.  Invalid input(s): PCO2, PO2  MEDS, Scheduled . famotidine  20 mg Oral BID  . feeding supplement  1 Container Oral TID BM  . losartan  100 mg Oral Daily  . methocarbamol  1,000 mg Oral QID  . predniSONE  5 mg Oral Q breakfast  . pyridostigmine  60 mg Oral TID  . saccharomyces boulardii  250 mg Oral BID  . spironolactone  25 mg Oral Daily    Studies/Results: No results found.  Assessment: s/p Procedure(s): OPEN SIGMOIDECTOMY OPEN HERNIA REPAIR UMBILICAL ADULT CYSTOSCOPY WITH RETROGRADE AND BILATERAL URETERAL STENT PLACEMENT Patient Active Problem List   Diagnosis Date Noted  . Diverticular stricture ([place]) 03/18/2017  . Skin erythema 11/01/2016  . Sleep difficulties 10/31/2016  . Pelvic abscess in female 10/11/2016  . Bilateral leg edema 08/20/2016  . Recurrent  UTI 08/15/2016  . Psoriasis 04/13/2016  . History of diverticulitis 04/13/2016  . Osteopenia of multiple sites 04/13/2016  . DJD (degenerative joint disease), cervical 04/07/2016  . Primary osteoarthritis of both knees 04/07/2016  . Decreased abduction of right shoulder joint 04/07/2016  . Atrial tachycardia ([place]) 05/12/2015  . NSVT (nonsustained ventricular tachycardia) ([place]) 05/12/2015  . Abnormal nuclear stress test - INTERMEDIATE RISK 04/14/2015  . Coronary artery calcification seen on CAT scan 02/28/2015  . Left lower quadrant pain 02/17/2015  . Hyperglycemia 08/09/2014  . Dyspnea 03/20/2013  . Hypokalemia 12/31/2012  . Umbilical hernia 58/52/7782  . Pleural effusion 05/10/2012  . Myasthenia gravis without exacerbation ([place]) 02/12/2010  . DIPLOPIA 11/21/2009  . Vitamin D deficiency 06/25/2008  . Hyperlipidemia 06/25/2008  . Essential hypertension 06/25/2008  . Leukocytoclastic vasculitis ([place]) 06/19/2007  . MUSCLE WEAKNESS (GENERALIZED) 02/10/2007  . Unspecified Myalgia and Myositis 01/24/2007  . EDEMA- LOCALIZED 01/24/2007  . ANA POSITIVE, HX OF 01/24/2007    Expected post op course  Plan: Acute blood loss anemia: Hgb stable.  HD stable.   Acute Renal Insufficiency: resolved with IV fluids, foley out, urinating well Afib: NSR now, rate controlled.  Appreciate Cards recs.  Pt ok for anticoagulation Lasix per cards recs.  Currently held Cont soft diet as tolerated Cont Breeze supplement between meals today OOB and ambulating in hall  PO pain meds with antiemetic premed Should be ok to d/c home when cards is finished  LOS: 7 days     .Rosario Adie, [facility], [place]   03/25/2017 8:55 AM

## 2017-03-25 NOTE — Telephone Encounter (Signed)
New message    TCM appointment per Levada Dy, scheduled with Kimberly [name] 04/05/17 @10 . No appointment of care team for [facility]

## 2017-03-25 NOTE — Progress Notes (Addendum)
[facility] for heparin Indication: atrial fibrillation  Allergies  Allergen Reactions  . Azithromycin Other (See Comments)    Angioedema  . Ibuprofen     800 mg - edema all over, including lips and tongue  . Levaquin [Levofloxacin In D5w] Other (See Comments)    D/T MYASTHENIA GRAVIS  . Tetracycline Rash  . Isovue [Iopamidol] Dermatitis    Pt had a rash and blisters on bilat feet and lower part of legs.  Full premeds in the future.  This happened twice after IV contrast.   J Bohm  RTRCT  . Sulfonamide Derivatives     Seizure age 72 (Note: probably fever induced)  . Aleve [Naproxen] Other (See Comments)    Caused excessive salivation, throat tightness  . Tape Other (See Comments)    Causes blisters  Paper tape only  . Zosyn [Piperacillin Sod-Tazobactam So]     Severe leg rash, skin peeling  . Amlodipine Besy-Benazepril Hcl Cough  . Amoxicillin Swelling and Rash    Has patient had a PCN reaction causing immediate rash, facial/tongue/throat swelling, SOB or lightheadedness with hypotension: No Has patient had a PCN reaction causing severe rash involving mucus membranes or skin necrosis: No Has patient had a PCN reaction that required hospitalization: No Has patient had a PCN reaction occurring within the last 10 years: Yes If all of the above answers are "NO", then may proceed with Cephalosporin use.     Patient Measurements: Height: 5\' 2"  (157.5 cm) Weight: 275 lb 2.2 oz (124.8 kg) IBW/kg (Calculated) : 50.1 Heparin Dosing Weight: 79 kg  Vital Signs: Temp: 97.9 F (36.6 C) (03/15 0329) Temp Source: Oral (03/15 0329) BP: 93/35 (03/15 0600) Pulse Rate: 57 (03/15 0600)  Labs: Recent Labs    03/22/17 0747 03/24/17 1016 03/24/17 2122 03/25/17 0332  HGB 8.7*  --   --  8.4*  HCT 29.0*  --   --  26.9*  PLT 473*  --   --  401*  HEPARINUNFRC  --   --  0.29* 0.35  TROPONINI  --  <0.03  --   --     Estimated Creatinine Clearance: 79.7  mL/min (by C-G formula based on SCr of 0.83 mg/dL).   Medical History: Past Medical History:  Diagnosis Date  . Allergy   . Aortic atherosclerosis ([place])   . Atrial tachycardia ([place]) 05/12/2015  . Cataract   . Colovesical fistula   . Diverticulitis   . Diverticulosis   . DJD (degenerative joint disease), cervical   . Frequent UTI   . GERD (gastroesophageal reflux disease)   . History of hiatal hernia    Small hernia  . Hypertension   . Internal hemorrhoids 01/21/2017   noted on colonopscopy  . Leukocytoclastic vasculitis ([place])   . Myalgia and myositis, unspecified   . Myasthenia gravis without exacerbation ([facility])    [facility], Dr Anna Genre  . NSVT (nonsustained ventricular tachycardia) ([place]) 05/12/2015  . Osteoarthritis    Both knees  . Osteopenia   . Other and unspecified hyperlipidemia   . Periumbilical hernia   . Pneumonia 04/2016  . PONV (postoperative nausea and vomiting)   . Psoriasis    plantar ;Dr Jarome Matin  . Sleep difficulties   . Vasculitis ([place])    Dr Estanislado Pandy  . Vitamin D deficiency     Assessment: 72 YOF developed atrial fibrillation with RVR s/p open sigmoidectomy, umbilical hernia repair on 03/18/17.  Pharmacy asked to dose heparin gtt.  Today, 03/25/2017  Heparin level therapeutic (0.35) on 1150 units/hr  CBC: Hgb low but stable, pltc mildly elevated  Renal: AKI resolved  Goal of Therapy:  Heparin level 0.3-0.7 units/ml Monitor platelets by anticoagulation protocol: Yes   Plan:   Continue Heparin infusion at 1150 units/hr  Daily heparin level and CBC  F/U cardiology recommendations re: long-term AC needs  Netta Cedars, PharmD, BCPS Pager: [phone] 03/25/2017 6:49 AM  Addendum:  Cardiology is now changing IV heparin to Eliquis for discharge, stroke prophylaxis in Afib patient.  Based her age, weight, and renal function she does not need any dose adjustments.   1) D/C IV heparin 2) Begin Eliquis 5mg  PO BID 3) Provide patient  education.   Netta Cedars, PharmD, BCPS Pager: [phone] 03/25/2017@11 :21 AM

## 2017-03-25 NOTE — Progress Notes (Addendum)
Progress Note  Patient Name: Kimberly [name] Date of Encounter: 03/25/2017  Primary Cardiologist: Dr. Oval Linsey  Subjective   Pt feels much better today than yesterday, but did not sleep well last night. She is anxious about the new medications amiodarone and eliquis.  Inpatient Medications    Scheduled Meds: . famotidine  20 mg Oral BID  . feeding supplement  1 Container Oral TID BM  . losartan  100 mg Oral Daily  . methocarbamol  1,000 mg Oral QID  . predniSONE  5 mg Oral Q breakfast  . pyridostigmine  60 mg Oral TID  . saccharomyces boulardii  250 mg Oral BID  . spironolactone  25 mg Oral Daily   Continuous Infusions: . amiodarone 30 mg/hr (03/25/17 0600)  . dextrose 5 % and 0.45 % NaCl with KCl 20 mEq/L 10 mL/hr at 03/25/17 0600  . heparin 1,150 Units/hr (03/25/17 0600)   PRN Meds: alum & mag hydroxide-simeth, calcium carbonate, diphenhydrAMINE **OR** diphenhydrAMINE, HYDROmorphone (DILAUDID) injection, ondansetron **OR** ondansetron (ZOFRAN) IV, oxyCODONE, promethazine   Vital Signs    Vitals:   03/25/17 0744 03/25/17 0800 03/25/17 0900 03/25/17 1000  BP:  (!) 112/39 (!) 112/57 (!) 125/31  Pulse:  64 83 68  Resp:  15 20 17   Temp: 98.7 F (37.1 C)     TempSrc: Oral     SpO2:  92% 96% 93%  Weight:      Height:        Intake/Output Summary (Last 24 hours) at 03/25/2017 1103 Last data filed at 03/25/2017 0600 Gross per 24 hour  Intake 9129.96 ml  Output -  Net 9129.96 ml   Filed Weights   03/23/17 1320 03/24/17 0551 03/25/17 0400  Weight: 259 lb 4.2 oz (117.6 kg) 262 lb 3.2 oz (118.9 kg) 275 lb 2.2 oz (124.8 kg)     Physical Exam   General: Well developed, well nourished, female appearing in no acute distress. Head: Normocephalic, atraumatic.  Neck: Supple without bruits, no JVD. Lungs:  Resp regular and unlabored, CTA. Heart: RRR, S1, S2, no S3, S4, or murmur; no rub. Abdomen: Soft, tender near incision, non-distended with normoactive bowel  sounds. No hepatomegaly. No rebound/guarding. No obvious abdominal masses. Extremities: No clubbing, cyanosis, trace edema. Distal pedal pulses are 2+ bilaterally. Neuro: Alert and oriented X 3. Moves all extremities spontaneously. Psych: Normal affect.  Labs    Chemistry Recent Labs  Lab 03/19/17 0428 03/20/17 0508 03/21/17 0442  NA 135 137 139  K 4.1 4.0 4.3  CL 101 106 107  CO2 25 23 28   GLUCOSE 161* 90 98  BUN 17 14 10   CREATININE 2.06* 1.18* 0.83  CALCIUM 8.0* 7.6* 7.7*  GFRNONAA 23* 46* >60  GFRAA 27* 53* >60  ANIONGAP 9 8 4*     Hematology Recent Labs  Lab 03/21/17 0442 03/22/17 0747 03/25/17 0332  WBC 10.7* 11.5* 10.7*  RBC 2.56* 3.24* 3.07*  HGB 7.2* 8.7* 8.4*  HCT 22.7* 29.0* 26.9*  MCV 88.7 89.5 87.6  MCH 28.1 26.9 27.4  MCHC 31.7 30.0 31.2  RDW 15.3 15.2 15.1  PLT 298 473* 401*    Cardiac Enzymes Recent Labs  Lab 03/19/17 2247 03/24/17 1016  TROPONINI <0.03 <0.03   No results for input(s): TROPIPOC in the last 168 hours.   BNPNo results for input(s): BNP, PROBNP in the last 168 hours.   DDimer No results for input(s): DDIMER in the last 168 hours.   Radiology    No results  found.   Telemetry    Sinus rhythm at 1704 on 03/24/17 - Personally Reviewed  ECG    pending - Personally Reviewed   Cardiac Studies   Echo 03/24/17: Study Conclusions - Left ventricle: The cavity size was normal. Wall thickness was   increased in a pattern of mild LVH. Indeterminant diastolic   function (atrial fibrillation). Systolic function was normal. The   estimated ejection fraction was in the range of 55% to 60%.   Although no diagnostic regional wall motion abnormality was   identified, this possibility cannot be completely excluded on the   basis of this study. - Aortic valve: There was no stenosis. - Mitral valve: Mildly calcified annulus. There was no significant   regurgitation. - Right ventricle: The cavity size was normal. Systolic  function   was normal. - Pulmonary arteries: No complete TR doppler jet so unable to   estimate PA systolic pressure. - Systemic veins: IVC measured 2.0 cm with < 50% respirophasic   variation, suggesting RA pressure 8 mmHg.  Impressions: - Normal LV size with mild LV hypertrophy. EF 55-60%. Normal RV   size and systolic function. No significant valvular   abnormalities.   Echo 05/26/15: Study Conclusions  - Left ventricle: The cavity size was mildly dilated. Systolic function was normal. The estimated ejection fraction was in the range of 60% to 65%. Wall motion was normal; there were no regional wall motion abnormalities. Left ventricular diastolic function parameters were normal. - Aortic valve: Transvalvular velocity was within the normal range. There was no stenosis. There was no regurgitation. - Mitral valve: There was no regurgitation. - Left atrium: The atrium was mildly dilated. - Right ventricle: The cavity size was normal. Wall thickness was normal. Systolic function was normal. - Tricuspid valve: There was no regurgitation. - Inferior vena cava: The vessel was normal in size. The respirophasic diameter changes were in the normal range (= 50%), consistent with normal central venous pressure.   28 Day Event Monitor 04/01/15:  Quality: Fair. Baseline artifact. Predominant rhythm: sinus rhythm Average heart rate: 70 bpm Max heart rate: 126 bpm Min heart rate: 50 bpm  Short runs of atrial tachycardia lasting up to 11 seconds. One 6 beat run of NSVT.    Lexiscan Cardiolite 03/28/15: 1. Nuclear stress EF: 71%. 2. The left ventricular ejection fraction is hyperdynamic (>65%). 3. There was no ST segment deviation noted during stress. 4. This is an intermediate risk study. 5. Findings consistent with ischemia.  Technically difficult study due to increased subdiaphragmatic activity; intermediate risk with small, severe, reversible defects in  the apical and inferior basal walls consistent with ischemia; EF 71 with normal wall motion.   LHC 04/17/15: The left ventricular systolic function is normal. Angiographically normal coronary arteries, but very tortuous  Angiographically no evidence of any significant lesions to explain the patient's abnormal stress test. There does appear to be mild calcification in the LAD distribution, but certainly not on the luminal aspect.    Patient Profile     72 y.o. female  with a hx of myasthenia gravis, coronary artery calcification, recurrent R pleural effusion, HLD, recurrent UTIs, and diverticulitis, and R LE lymphedema who is being seen for Afib RVR  Assessment & Plan    1. New onset atrial fibrillation with RVR Amiodarone drip started yesterday. She converted to sinus rhythm at approximately 1704 on 03/24/17. She is NSR in the 60s. Will transition amiodarone drip to PO dosing - 200 mg BID. If she  remains in NSR, OK to discharge home on 200 mg amiodarone BID until follow up with [place]. Continue on eliquis. Will likely be able to wean amiodarone in the outpatient setting. This bout of Afib was the first occurrence and in the setting of a recent surgery (this admission). She generally has atrial tachycardia that self-resolves at home. May consider event monitor in the future to confirm absence of Afib and possible D/C eliquis. Continue to avoid AV nodal agents in the setting of myasthenia gravis. Will transition heparin drip to eliquis 5 mg BID. Echo yesterday with no significant changes from prior.   2. HTN  Pressures are well-controlled. Continue home regimen.   3. Lymphedema Resume home lasix.   Follow up with cardiology has been arranged.   Signed, Ledora Bottcher , PA-C 11:03 AM 03/25/2017 Pager: [phone]  History and all data above reviewed.  Patient examined.  I agree with the findings as above.  No chest pain.  No SOB. Back in sinus and did not notice when she converted  The patient exam reveals COR:RRR, slight systolic murmur  ,  Lungs: clear  ,  Abd: Positive bowel sounds, no rebound no guarding, Ext No edeam  .  All available labs, radiology testing, previous records reviewed. Agree with documented assessment and plan. Atrial fib:  Back in NSR.  Agree with short term DOAC and amiodarone and Dr. Oval Linsey can decide on when or if to discontinue one or both of these.  I do not suspect that they will be needed long term.   Jeneen Rinks Nolan Lasser  11:54 AM  03/25/2017

## 2017-03-25 NOTE — Care Management Note (Signed)
Case Management Note  Patient Details  Name: Kimberly [name] MRN: [id number] Date of Birth: [birth year]  Subjective/Objective:  Patients states she has rw,3n1.Used [facility] in past will use again-she only wants HHPT-[facility] rep Santiago Glad aware & following.                  Action/Plan:d/c home w/HHC.   Expected Discharge Date:                  Expected Discharge Plan:  [place]  In-House Referral:  NA  Discharge planning Services  CM Consult  Post Acute Care Choice:  Home Health, Durable Medical Equipment(Has rw,3n1) Choice offered to:  Patient  DME Arranged:  N/A DME Agency:  NA  HH Arranged:  PT [place] Agency:  [place]  Status of Service:  In process, will continue to follow  If discussed at Long Length of Stay Meetings, dates discussed:    Additional Comments:  Dessa Phi, RN 03/25/2017, 3:06 PM

## 2017-03-25 NOTE — Telephone Encounter (Signed)
Current admit 

## 2017-03-25 NOTE — Progress Notes (Signed)
Physical Therapy Treatment Patient Details Name: Kimberly [name] MRN: [id number] DOB: 1945/03/09 Today's Date: 03/25/2017    History of Present Illness 72 year old female with myasthenia gravis who has a known colovesicular fistula from diverticular disease.She has had multiple abdominal surgeries over many years. Diagnosed with myasthenia gravis in 2011. 3/8 underwent open sigmoidectomy, open hernia repair and cystoscopy wtih retrograde and B urithral stent placement.  Wound vac curently in place. Significant PMH: arthritis, R frozen shoulder; L partial frozen shoulder (per pt); vasculitis.    PT Comments    Pt progressing well with mobility, she ambulated 73' with RW, no loss of balance, vital signs stable. Significant drainage from abdominal wound noted, RN notified.   Follow Up Recommendations  Home health PT;Supervision for mobility/OOB     Equipment Recommendations  None recommended by PT    Recommendations for Other Services       Precautions / Restrictions Precautions Precautions: Fall;Other (comment) Precaution Comments: recent ABD surgery  Restrictions Weight Bearing Restrictions: No    Mobility  Bed Mobility               General bed mobility comments: OOB in recliner  Transfers Overall transfer level: Needs assistance Equipment used: Rolling walker (2 wheeled) Transfers: Sit to/from Stand Sit to Stand: Supervision         General transfer comment: pt uses forward momentum and hands to push self to rise and does well to control desend.  Ambulation/Gait Ambulation/Gait assistance: Min guard;+2 safety/equipment(to follow with recliner) Ambulation Distance (Feet): 90 Feet Assistive device: Rolling walker (2 wheeled) Gait Pattern/deviations: Step-to pattern;Wide base of support;Decreased stride length Gait velocity: decreased   General Gait Details: Distance limited by fatigue.  Spouse followed with recliner. VSS. HR 86, SaO2 90s on room  air   Stairs            Wheelchair Mobility    Modified Rankin (Stroke Patients Only)       Balance Overall balance assessment: Modified Independent                                          Cognition Arousal/Alertness: Awake/alert Behavior During Therapy: WFL for tasks assessed/performed Overall Cognitive Status: Within Functional Limits for tasks assessed                                        Exercises      General Comments General comments (skin integrity, edema, etc.): significant drainage from abdominal wound (chair pad and gown saturated), RN notified      Pertinent Vitals/Pain Pain Score: 3  Pain Descriptors / Indicators: Aching Pain Intervention(s): Monitored during session;Premedicated before session;Limited activity within patient's tolerance    Home Living                      Prior Function            PT Goals (current goals can now be found in the care plan section) Acute Rehab PT Goals Patient Stated Goal: return to work, does nerve conduction studies PT Goal Formulation: With patient/family Time For Goal Achievement: 03/27/17 Potential to Achieve Goals: Good Progress towards PT goals: Progressing toward goals    Frequency    Min 3X/week      PT Plan Current plan remains  appropriate    Co-evaluation              AM-PAC PT "6 Clicks" Daily Activity  Outcome Measure  Difficulty turning over in bed (including adjusting bedclothes, sheets and blankets)?: A Lot Difficulty moving from lying on back to sitting on the side of the bed? : A Little Difficulty sitting down on and standing up from a chair with arms (e.g., wheelchair, bedside commode, etc,.)?: A Little Help needed moving to and from a bed to chair (including a wheelchair)?: A Little Help needed walking in hospital room?: A Little Help needed climbing 3-5 steps with a railing? : A Lot 6 Click Score: 16    End of Session  Equipment Utilized During Treatment: Gait belt Activity Tolerance: Patient limited by fatigue Patient left: in chair;with call bell/phone within reach;with family/visitor present;with nursing/sitter in room Nurse Communication: Mobility status PT Visit Diagnosis: Difficulty in walking, not elsewhere classified (R26.2)     Time: 9030-0923 PT Time Calculation (min) (ACUTE ONLY): 19 min  Charges:  $Gait Training: 8-22 mins                    G Codes:         Kimberly [name] 03/25/2017, 1:27 PM [phone]

## 2017-03-26 LAB — CBC
HEMATOCRIT: 28.4 % — AB (ref 36.0–46.0)
Hemoglobin: 8.4 g/dL — ABNORMAL LOW (ref 12.0–15.0)
MCH: 26 pg (ref 26.0–34.0)
MCHC: 29.6 g/dL — AB (ref 30.0–36.0)
MCV: 87.9 fL (ref 78.0–100.0)
PLATELETS: 356 10*3/uL (ref 150–400)
RBC: 3.23 MIL/uL — ABNORMAL LOW (ref 3.87–5.11)
RDW: 15.2 % (ref 11.5–15.5)
WBC: 10.7 10*3/uL — AB (ref 4.0–10.5)

## 2017-03-26 MED ORDER — OXYCODONE HCL 5 MG PO TABS: 5.0000 mg | ORAL_TABLET | Freq: Four times a day (QID) | ORAL | 0 refills | Status: DC | PRN

## 2017-03-26 MED ORDER — AMIODARONE HCL 200 MG PO TABS: 200.0000 mg | ORAL_TABLET | Freq: Two times a day (BID) | ORAL | 1 refills | Status: DC

## 2017-03-26 MED ORDER — METHOCARBAMOL 500 MG PO TABS: 1000.0000 mg | ORAL_TABLET | Freq: Three times a day (TID) | ORAL | 0 refills | Status: DC | PRN

## 2017-03-26 MED ORDER — APIXABAN 5 MG PO TABS: 5.0000 mg | ORAL_TABLET | Freq: Two times a day (BID) | ORAL | 1 refills | Status: DC

## 2017-03-26 NOTE — Discharge Summary (Signed)
Physician Discharge Summary [facility]  Patient ID: Kimberly [name] MRN: [id number] DOB/AGE: 03-22-1945 72 y.o.  Admit date: 03/18/2017 Discharge date: 03/26/2017  Admission Diagnoses:  Diverticular disease with stricture  Discharge Diagnoses:  Active Problems:   Diverticular stricture [facility])   Discharged Condition: good  Hospital Course: Patient was admitted for observation following colon surgery.  Post op course was complicated by onset atrial fibrillation with RVR.  Patient was seen by cardiology and converted on IV amiodarone.  Converted to oral medication.  Pain was well controlled.  Tolerated diet.  Patient was prepared for discharge home on POD#8.  Consults: cardiology  Treatments: surgery: open sigmoid colectomy  Discharge Exam: Blood pressure (!) 145/76, pulse 73, temperature 98 F (36.7 C), temperature source Oral, resp. rate 14, height 5\' 2"  (1.575 m), weight 123 kg (271 lb 2.7 oz), SpO2 99 %. HEENT - clear Neck - soft Chest - clear bilaterally Cor - RRR @ 69 Abd - soft, obese; BS present; lower midline wound intact with serous drainage on dressing  Disposition: Home  Discharge Instructions    Change dressing (specify)   Complete by:  As directed    Change dressing as needed 2-3 times daily.  May shower daily.   Diet - low sodium heart healthy   Complete by:  As directed    Increase activity slowly   Complete by:  As directed      Allergies as of 03/26/2017      Reactions   Azithromycin Other (See Comments)   Angioedema   Ibuprofen    800 mg - edema all over, including lips and tongue   Levaquin [levofloxacin In D5w] Other (See Comments)   D/T MYASTHENIA GRAVIS   Tetracycline Rash   Isovue [iopamidol] Dermatitis   Pt had a rash and blisters on bilat feet and lower part of legs.  Full premeds in the future.  This happened twice after IV contrast.   J Bohm  RTRCT   Sulfonamide Derivatives    Seizure age 9 (Note: probably fever  induced)   Aleve [naproxen] Other (See Comments)   Caused excessive salivation, throat tightness   Tape Other (See Comments)   Causes blisters  Paper tape only   Zosyn [piperacillin Sod-tazobactam So]    Severe leg rash, skin peeling   Amlodipine Besy-benazepril Hcl Cough   Amoxicillin Swelling, Rash   Has patient had a PCN reaction causing immediate rash, facial/tongue/throat swelling, SOB or lightheadedness with hypotension: No Has patient had a PCN reaction causing severe rash involving mucus membranes or skin necrosis: No Has patient had a PCN reaction that required hospitalization: No Has patient had a PCN reaction occurring within the last 10 years: Yes If all of the above answers are "NO", then may proceed with Cephalosporin use.      Medication List    STOP taking these medications   aspirin EC 81 MG tablet   metroNIDAZOLE 500 MG tablet Commonly known as:  FLAGYL   sodium chloride flush 0.9 % Soln Commonly known as:  NS     TAKE these medications   acetaminophen 325 MG tablet Commonly known as:  TYLENOL Take 2 tablets (650 mg total) by mouth every 6 (six) hours as needed for mild pain (or temp > 100). What changed:  Another medication with the same name was removed. Continue taking this medication, and follow the directions you see here.   amiodarone 200 MG tablet Commonly known as:  PACERONE Take 1 tablet (  200 mg total) by mouth 2 (two) times daily.   apixaban 5 MG Tabs tablet Commonly known as:  ELIQUIS Take 1 tablet (5 mg total) by mouth 2 (two) times daily.   augmented betamethasone dipropionate 0.05 % ointment Commonly known as:  DIPROLENE-AF Apply 1 application topically daily. For vasculitis and psoriasis   CALCIUM PO Take 1,000 mg by mouth at bedtime.   cholecalciferol 1000 units tablet Commonly known as:  VITAMIN D Take 1,000 Units by mouth at bedtime.   folic acid 1 MG tablet Commonly known as:  FOLVITE TAKE 2 TABLETS BY MOUTH ONCE DAILY What  changed:    how much to take  how to take this  when to take this   furosemide 40 MG tablet Commonly known as:  LASIX Take 1 tablet (40 mg total) by mouth daily.   LORazepam 0.5 MG tablet Commonly known as:  ATIVAN Take 1 tablet (0.5 mg total) by mouth at bedtime.   losartan 100 MG tablet Commonly known as:  COZAAR Take 1 tablet (100 mg total) by mouth daily.   methocarbamol 500 MG tablet Commonly known as:  ROBAXIN Take 2 tablets (1,000 mg total) by mouth every 8 (eight) hours as needed for muscle spasms.   multivitamin with minerals Tabs tablet Take 1 tablet by mouth at bedtime.   oxyCODONE 5 MG immediate release tablet Commonly known as:  Oxy IR/ROXICODONE Take 1-2 tablets (5-10 mg total) by mouth every 6 (six) hours as needed for moderate pain.   predniSONE 5 MG tablet Commonly known as:  DELTASONE TAKE 1 TABLET BY MOUTH EVERY DAY WITH BREAKFAST AND WITH 2 PREDNISONE 1MG  TABLETS What changed:  See the new instructions.   predniSONE 1 MG tablet Commonly known as:  DELTASONE TAKE 2 TABLETS (2 MG TOTAL) BY MOUTH DAILY WITH BREAKFAST. TAKE WITH A 5 MG TABLET FOR A 7 MG DOSE What changed:  Another medication with the same name was changed. Make sure you understand how and when to take each.   pyridostigmine 60 MG tablet Commonly known as:  MESTINON Take 1 tablet (60 mg total) by mouth 3 (three) times daily.   pyridOXINE 100 MG tablet Commonly known as:  VITAMIN B-6 Take 100 mg by mouth at bedtime.   spironolactone 25 MG tablet Commonly known as:  ALDACTONE TAKE 1 TABLET BY MOUTH EVERY DAY   traMADol 50 MG tablet Commonly known as:  ULTRAM Take 1 tablet (50 mg total) by mouth every 12 (twelve) hours as needed (mild pain).   traZODone 50 MG tablet Commonly known as:  DESYREL TAKE 1 TO 2 TABLETS BY MOUTH EVERY DAY AT BEDTIME AS NEEDED FOR SLEEP What changed:  See the new instructions.   vitamin B-12 1000 MCG tablet Commonly known as:  CYANOCOBALAMIN Take  1,000 mcg by mouth at bedtime.            Durable Medical Equipment  (From admission, onward)        Start     Ordered   03/23/17 0824  For home use only DME Bedside commode  Once    Question Answer Comment  Patient needs a bedside commode to treat with the following condition Post-operative state   Patient needs a bedside commode to treat with the following condition Morbid obesity ([place])      03/23/17 0824       Discharge Care Instructions  (From admission, onward)        Start     Ordered  03/26/17 0000  Change dressing (specify)    Comments:  Change dressing as needed 2-3 times daily.  May shower daily.   03/26/17 [facility] Go on 04/01/2017.   Specialty:  General Surgery Why:  at 2:00pm for staple removal and RN assessment Contact information: [address] [place] [phone]       Leighton Ruff, MD. Daphane Shepherd on 3/88/8280.   Specialty:  General Surgery Why:  at 10:20am.  Please arrive 10 mins prior to apt time Contact information: [address] [place] [address] [phone]        Almyra Deforest, [place] Follow up on 04/05/2017.   Specialties:  Cardiology, Radiology Why:  10:00 am TCM Contact information: [address] [place] [address] [place], [place] Follow up.   Specialty:  Home Health Services Why:  [facility] physical therapy Contact information: [place] [address] [place], [place], [facility] Office: [phone]   Signed: Earnstine Regal 03/26/2017, 9:05 AM

## 2017-03-26 NOTE — Progress Notes (Signed)
Patient received discharge education at 1pm. Patient's husband present at this time. Opportunity for questions provided, no questions asked.  Patient's peripheral IVs removed. Patient accompanied by NT to car by wheelchair.

## 2017-03-26 NOTE — Progress Notes (Signed)
Discharge Planning:  Spoke to pt and provided Eliquis 30 day free trial card. Contacted [facility] and they do have in stock. Contacted [facility] to make aware of dc home today with HHPT. Pt declined [place]. Jonnie Finner RN CCM Case Mgmt phone [phone]

## 2017-03-28 NOTE — Telephone Encounter (Signed)
Patient contacted regarding discharge from [facility] on 03/26/17.    Patient understands to follow up with provider Almyra Deforest PA on 04/08/17 at 10 AM at [facility] office.  Patient understands discharge instructions? yes  Patient understands medications and regiment? yes  Patient understands to bring all medications to this visit? yes    Patient originally scheduled for 3/26 at 10 AM but has conflicting appointments with Dr. Grandville Silos to assess her staples/wounds.   appt has been rescheduled to 3/29 at 10AM with Almyra Deforest PA, patient aware and verbalized understanding.

## 2017-03-30 DIAGNOSIS — Z7952 Long term (current) use of systemic steroids: Secondary | ICD-10-CM | POA: Diagnosis not present

## 2017-03-30 DIAGNOSIS — Z8249 Family history of ischemic heart disease and other diseases of the circulatory system: Secondary | ICD-10-CM | POA: Diagnosis not present

## 2017-03-30 DIAGNOSIS — I890 Lymphedema, not elsewhere classified: Secondary | ICD-10-CM | POA: Diagnosis not present

## 2017-03-30 DIAGNOSIS — K5790 Diverticulosis of intestine, part unspecified, without perforation or abscess without bleeding: Secondary | ICD-10-CM | POA: Diagnosis not present

## 2017-03-30 DIAGNOSIS — Z87440 Personal history of urinary (tract) infections: Secondary | ICD-10-CM | POA: Diagnosis not present

## 2017-03-30 DIAGNOSIS — K219 Gastro-esophageal reflux disease without esophagitis: Secondary | ICD-10-CM | POA: Diagnosis not present

## 2017-03-30 DIAGNOSIS — G7000 Myasthenia gravis without (acute) exacerbation: Secondary | ICD-10-CM | POA: Diagnosis not present

## 2017-03-30 DIAGNOSIS — I4891 Unspecified atrial fibrillation: Secondary | ICD-10-CM | POA: Diagnosis not present

## 2017-03-30 DIAGNOSIS — I10 Essential (primary) hypertension: Secondary | ICD-10-CM | POA: Diagnosis not present

## 2017-03-30 DIAGNOSIS — M1990 Unspecified osteoarthritis, unspecified site: Secondary | ICD-10-CM | POA: Diagnosis not present

## 2017-03-30 DIAGNOSIS — H532 Diplopia: Secondary | ICD-10-CM | POA: Diagnosis not present

## 2017-03-30 DIAGNOSIS — I2584 Coronary atherosclerosis due to calcified coronary lesion: Secondary | ICD-10-CM | POA: Diagnosis not present

## 2017-03-30 DIAGNOSIS — M170 Bilateral primary osteoarthritis of knee: Secondary | ICD-10-CM | POA: Diagnosis not present

## 2017-03-30 DIAGNOSIS — Z960 Presence of urogenital implants: Secondary | ICD-10-CM | POA: Diagnosis not present

## 2017-03-30 DIAGNOSIS — Z48815 Encounter for surgical aftercare following surgery on the digestive system: Secondary | ICD-10-CM | POA: Diagnosis not present

## 2017-03-30 DIAGNOSIS — E785 Hyperlipidemia, unspecified: Secondary | ICD-10-CM | POA: Diagnosis not present

## 2017-04-02 ENCOUNTER — Other Ambulatory Visit: Payer: Self-pay | Admitting: Internal Medicine

## 2017-04-04 DIAGNOSIS — Z48815 Encounter for surgical aftercare following surgery on the digestive system: Secondary | ICD-10-CM | POA: Diagnosis not present

## 2017-04-04 DIAGNOSIS — G7000 Myasthenia gravis without (acute) exacerbation: Secondary | ICD-10-CM | POA: Diagnosis not present

## 2017-04-04 DIAGNOSIS — I2584 Coronary atherosclerosis due to calcified coronary lesion: Secondary | ICD-10-CM | POA: Diagnosis not present

## 2017-04-04 DIAGNOSIS — K5790 Diverticulosis of intestine, part unspecified, without perforation or abscess without bleeding: Secondary | ICD-10-CM | POA: Diagnosis not present

## 2017-04-04 DIAGNOSIS — M170 Bilateral primary osteoarthritis of knee: Secondary | ICD-10-CM | POA: Diagnosis not present

## 2017-04-04 DIAGNOSIS — I4891 Unspecified atrial fibrillation: Secondary | ICD-10-CM | POA: Diagnosis not present

## 2017-04-05 ENCOUNTER — Ambulatory Visit: Payer: Medicare Other | Admitting: Physician Assistant

## 2017-04-06 DIAGNOSIS — I4891 Unspecified atrial fibrillation: Secondary | ICD-10-CM | POA: Diagnosis not present

## 2017-04-06 DIAGNOSIS — K5790 Diverticulosis of intestine, part unspecified, without perforation or abscess without bleeding: Secondary | ICD-10-CM | POA: Diagnosis not present

## 2017-04-06 DIAGNOSIS — Z48815 Encounter for surgical aftercare following surgery on the digestive system: Secondary | ICD-10-CM | POA: Diagnosis not present

## 2017-04-06 DIAGNOSIS — G7000 Myasthenia gravis without (acute) exacerbation: Secondary | ICD-10-CM | POA: Diagnosis not present

## 2017-04-06 DIAGNOSIS — I2584 Coronary atherosclerosis due to calcified coronary lesion: Secondary | ICD-10-CM | POA: Diagnosis not present

## 2017-04-06 DIAGNOSIS — M170 Bilateral primary osteoarthritis of knee: Secondary | ICD-10-CM | POA: Diagnosis not present

## 2017-04-08 ENCOUNTER — Encounter: Payer: Self-pay | Admitting: Physician Assistant

## 2017-04-08 ENCOUNTER — Ambulatory Visit (INDEPENDENT_AMBULATORY_CARE_PROVIDER_SITE_OTHER): Payer: Medicare Other | Admitting: Physician Assistant

## 2017-04-08 VITALS — BP 112/62 | HR 68 | Ht 62.0 in | Wt 256.8 lb

## 2017-04-08 DIAGNOSIS — Z79899 Other long term (current) drug therapy: Secondary | ICD-10-CM

## 2017-04-08 DIAGNOSIS — I480 Paroxysmal atrial fibrillation: Secondary | ICD-10-CM

## 2017-04-08 DIAGNOSIS — G7000 Myasthenia gravis without (acute) exacerbation: Secondary | ICD-10-CM

## 2017-04-08 DIAGNOSIS — E785 Hyperlipidemia, unspecified: Secondary | ICD-10-CM | POA: Diagnosis not present

## 2017-04-08 DIAGNOSIS — I10 Essential (primary) hypertension: Secondary | ICD-10-CM | POA: Diagnosis not present

## 2017-04-08 DIAGNOSIS — I2584 Coronary atherosclerosis due to calcified coronary lesion: Secondary | ICD-10-CM | POA: Diagnosis not present

## 2017-04-08 DIAGNOSIS — I2510 Atherosclerotic heart disease of native coronary artery without angina pectoris: Secondary | ICD-10-CM

## 2017-04-08 DIAGNOSIS — N179 Acute kidney failure, unspecified: Secondary | ICD-10-CM

## 2017-04-08 DIAGNOSIS — D649 Anemia, unspecified: Secondary | ICD-10-CM | POA: Diagnosis not present

## 2017-04-08 LAB — CBC WITH DIFFERENTIAL/PLATELET
BASOS ABS: 0 10*3/uL (ref 0.0–0.2)
BASOS: 0 %
EOS (ABSOLUTE): 0.2 10*3/uL (ref 0.0–0.4)
Eos: 2 %
Hematocrit: 30.5 % — ABNORMAL LOW (ref 34.0–46.6)
Hemoglobin: 9.8 g/dL — ABNORMAL LOW (ref 11.1–15.9)
IMMATURE GRANS (ABS): 0.1 10*3/uL (ref 0.0–0.1)
Immature Granulocytes: 1 %
LYMPHS ABS: 1.7 10*3/uL (ref 0.7–3.1)
Lymphs: 17 %
MCH: 26.8 pg (ref 26.6–33.0)
MCHC: 32.1 g/dL (ref 31.5–35.7)
MCV: 84 fL (ref 79–97)
MONOS ABS: 0.8 10*3/uL (ref 0.1–0.9)
Monocytes: 8 %
NEUTROS ABS: 6.9 10*3/uL (ref 1.4–7.0)
Neutrophils: 72 %
PLATELETS: 481 10*3/uL — AB (ref 150–379)
RBC: 3.65 x10E6/uL — ABNORMAL LOW (ref 3.77–5.28)
RDW: 15.4 % (ref 12.3–15.4)
WBC: 9.6 10*3/uL (ref 3.4–10.8)

## 2017-04-08 LAB — BASIC METABOLIC PANEL
BUN / CREAT RATIO: 16 (ref 12–28)
BUN: 26 mg/dL (ref 8–27)
CHLORIDE: 96 mmol/L (ref 96–106)
CO2: 28 mmol/L (ref 20–29)
Calcium: 10.3 mg/dL (ref 8.7–10.3)
Creatinine, Ser: 1.61 mg/dL — ABNORMAL HIGH (ref 0.57–1.00)
GFR calc non Af Amer: 32 mL/min/{1.73_m2} — ABNORMAL LOW (ref >59)
GFR, EST AFRICAN AMERICAN: 37 mL/min/{1.73_m2} — AB (ref >59)
GLUCOSE: 93 mg/dL (ref 65–99)
Potassium: 3.9 mmol/L (ref 3.5–5.2)
SODIUM: 140 mmol/L (ref 134–144)

## 2017-04-08 MED ORDER — AMIODARONE HCL 200 MG PO TABS: 200.0000 mg | ORAL_TABLET | Freq: Every day | ORAL | 1 refills | Status: DC

## 2017-04-08 NOTE — Progress Notes (Signed)
Cardiology Office Note    Date:  04/08/2017   ID:  Kimberly [name], DOB [birth year], MRN [id number]  PCP:  Kimberly Rail, MD  Cardiologist:  Dr. Oval Linsey   Chief Complaint  Patient presents with  . Hospitalization Follow-up    seen for Dr. Oval Linsey.     History of Present Illness:  Kimberly [name] is a 72 y.o. female with PMH of HTN, myasthenia gravis, coronary artery calcification, recurrent right pleural effusion, hyperlipidemia.  She was noted to have coronary calcification on previous CT test used to follow-up on pleural effusion.  She is largely asymptomatic and does not exercise much due to myasthenia gravis.  Myoview obtained in March 2017 was concerning for inferior and apical ischemia.  She subsequently underwent cardiac catheterization in 04/2015 which showed normal but tortuous coronary arteries.  30-day event monitor showed paroxysmal atrial tachycardia and SVT.  She was not started on AV nodal agents due to history of myasthenia.  Echocardiogram in May 2017 showed EF 60-65% with normal diastolic function.  She was last seen by Dr. Oval Linsey in September 2018, she was noted to have right lower extremity lymphedema and ready to undergo knee surgery.  She was admitted in October 2018 after found to have a pelvic abscess of 3.7 cm and a colovesical fistula secondary to diverticulitis.  She was started on IV antibiotic and had a percutaneous drain placed by interventional radiology.  Patient presented to the hospital again on 03/18/2017 for open sigmoidectomy, umbilical hernia repair, and cystoscopy with retrograde and bilateral ureteral stent placement.  On postoperative days #6, she was noted to be in atrial fibrillation with RVR, this was confirmed by EKG as well.  Heart rate was in the 160s.  Cardiology service was consulted.  Patient was placed on IV amiodarone with successful conversion to sinus rhythm.  She was eventually discharged home on 200 mg twice daily of amiodarone.  She  was also placed on Eliquis.  The recommendation is to confirm the absence of atrial fibrillation as outpatient with event monitor and potentially DC Eliquis if she does not have any recurrence.  If the current bout of atrial fibrillation was felt to be brought on by the stress from the surgery.  We will continue to avoid AV nodal agent in the setting of myasthenia gravis.  Patient presents today for cardiology office visit.  Her only complaint is she has been having dizziness and nausea every morning.  I plan to obtain a CBC in the basement metabolic panel today.  She has some acute kidney injury and also anemia prior to initiation of Eliquis.  I also am concerned about side effects from medication, most likely culprit would be amiodarone.  I will decrease amiodarone to 200 mg daily, she will continue on this dose of amiodarone for 1 month then discontinue after that.  1 week after she stopped amiodarone, she can wear a 30-day event monitor, if she has no recurrence of atrial fibrillation off of amiodarone, I think it would be reasonable to discontinue the Eliquis.  She can follow-up with Dr. Oval Linsey in 3 months.  Otherwise she does not have any lower extremity edema, she does have some scabs on her lower extremity which she attributed to healing blisters formed after antibiotic therapy.  She denies any chest pain or significant shortness of breath.     Past Medical History:  Diagnosis Date  . Allergy   . Aortic atherosclerosis ([place])   . Atrial tachycardia ([place]) 05/12/2015  .  Cataract   . Colovesical fistula   . Diverticulitis   . Diverticulosis   . DJD (degenerative joint disease), cervical   . Frequent UTI   . GERD (gastroesophageal reflux disease)   . History of hiatal hernia    Small hernia  . Hypertension   . Internal hemorrhoids 01/21/2017   noted on colonopscopy  . Leukocytoclastic vasculitis ([place])   . Myalgia and myositis, unspecified   . Myasthenia gravis without exacerbation (HCC)      [facility], Dr Kimberly [name]  . NSVT (nonsustained ventricular tachycardia) ([place]) 05/12/2015  . Osteoarthritis    Both knees  . Osteopenia   . Other and unspecified hyperlipidemia   . Periumbilical hernia   . Pneumonia 04/2016  . PONV (postoperative nausea and vomiting)   . Psoriasis    [place] ;Dr Kimberly [name]  . Sleep difficulties   . Vasculitis ([place])    Dr Kimberly [name]  . Vitamin D deficiency     Past Surgical History:  Procedure Laterality Date  . APPENDECTOMY     & exploratory for inflammation; Dr Hassell Done  . CARDIAC CATHETERIZATION N/A 04/17/2015   Procedure: Left Heart Cath and Coronary Angiography;  Surgeon: Kimberly Man, MD;  Location: [facility];  Service: Cardiovascular;  Laterality: N/A;  . CERVICAL FUSION     C5-6; Dr Vertell Limber  . CHOLECYSTECTOMY     for stones  . COLONOSCOPY W/ POLYPECTOMY  01/21/2017  . CYSTOSCOPY WITH STENT PLACEMENT Bilateral 03/18/2017   Procedure: CYSTOSCOPY WITH RETROGRADE AND BILATERAL URETERAL STENT PLACEMENT;  Surgeon: Lucas Mallow, MD;  Location: [facility];  Service: Urology;  Laterality: Bilateral;  . IR RADIOLOGIST EVAL & MGMT  11/16/2016  . IR RADIOLOGIST EVAL & MGMT  12/08/2016  . IR RADIOLOGIST EVAL & MGMT  12/16/2016  . LAMINECTOMY  2010   T1-2; Dr Vertell Limber  . PARTIAL COLECTOMY N/A 03/18/2017   Procedure: OPEN SIGMOIDECTOMY;  Surgeon: Kimberly Ruff, MD;  Location: [facility];  Service: General;  Laterality: N/A;  . SHOULDER SURGERY     Left & Right  . THORACENTESIS     for peripneumonic effusion  . TOTAL ABDOMINAL HYSTERECTOMY W/ BILATERAL SALPINGOOPHORECTOMY     For Fibroids & endometriosis  . UMBILICAL HERNIA REPAIR N/A 03/18/2017   Procedure: OPEN HERNIA REPAIR UMBILICAL ADULT;  Surgeon: Kimberly Ruff, MD;  Location: [facility];  Service: General;  Laterality: N/A;  ERAs pathway    Current Medications: Outpatient Medications Prior to Visit  Medication Sig Dispense Refill  . acetaminophen (TYLENOL) 325 MG tablet Take 2 tablets (650 mg total) by  mouth every 6 (six) hours as needed for mild pain (or temp > 100).    Marland Kitchen apixaban (ELIQUIS) 5 MG TABS tablet Take 1 tablet (5 mg total) by mouth 2 (two) times daily. 60 tablet 1  . augmented betamethasone dipropionate (DIPROLENE-AF) 0.05 % ointment Apply 1 application topically daily. For vasculitis and psoriasis    . CALCIUM PO Take 1,000 mg by mouth at bedtime.    . cholecalciferol (VITAMIN D) 1000 units tablet Take 1,000 Units by mouth at bedtime.    . folic acid (FOLVITE) 1 MG tablet TAKE 2 TABLETS BY MOUTH ONCE DAILY (Patient taking differently: TAKE 2 TABLETS BY MOUTH ONCE DAILY AT NIGHT) 180 tablet 4  . furosemide (LASIX) 40 MG tablet Take 1 tablet (40 mg total) by mouth daily. 90 tablet 1  . LORazepam (ATIVAN) 0.5 MG tablet Take 1 tablet (0.5 mg total) by mouth at bedtime. [place]  tablet 1  . losartan (COZAAR) 100 MG tablet Take 1 tablet (100 mg total) by mouth daily. 90 tablet 3  . methocarbamol (ROBAXIN) 500 MG tablet Take 2 tablets (1,000 mg total) by mouth every 8 (eight) hours as needed for muscle spasms. 20 tablet 0  . Multiple Vitamin (MULTIVITAMIN WITH MINERALS) TABS tablet Take 1 tablet by mouth at bedtime.    . predniSONE (DELTASONE) 1 MG tablet TAKE 2 TABLETS (2 MG TOTAL) BY MOUTH DAILY WITH BREAKFAST. TAKE WITH A 5 MG TABLET FOR A 7 MG DOSE 180 tablet 1  . predniSONE (DELTASONE) 5 MG tablet TAKE 1 TABLET BY MOUTH EVERY DAY WITH BREAKFAST AND WITH 2 PREDNISONE 1MG  TABLETS (Patient taking differently: TAKE 1 TABLET BY MOUTH EVERY DAY WITH BREAKFAST) 90 tablet 0  . pyridostigmine (MESTINON) 60 MG tablet Take 1 tablet (60 mg total) by mouth 3 (three) times daily. 270 tablet 3  . pyridOXINE (VITAMIN B-6) 100 MG tablet Take 100 mg by mouth at bedtime.    Marland Kitchen spironolactone (ALDACTONE) 25 MG tablet TAKE 1 TABLET BY MOUTH EVERY DAY 90 tablet 3  . traMADol (ULTRAM) 50 MG tablet Take 1 tablet (50 mg total) by mouth every 12 (twelve) hours as needed (mild pain). 10 tablet 0  . traZODone (DESYREL)  50 MG tablet Take 1-2 tablets by mouth daily at bedtime as needed for sleep -- Office visit needed for further refills 30 tablet 0  . vitamin B-12 (CYANOCOBALAMIN) 1000 MCG tablet Take 1,000 mcg by mouth at bedtime.    Marland Kitchen amiodarone (PACERONE) 200 MG tablet Take 1 tablet (200 mg total) by mouth 2 (two) times daily. 60 tablet 1  . oxyCODONE (OXY IR/ROXICODONE) 5 MG immediate release tablet Take 1-2 tablets (5-10 mg total) by mouth every 6 (six) hours as needed for moderate pain. 30 tablet 0   No facility-administered medications prior to visit.      Allergies:   Azithromycin; Ibuprofen; Levaquin [levofloxacin in d5w]; Tetracycline; Isovue [iopamidol]; Sulfonamide derivatives; Aleve [naproxen]; Tape; Zosyn [piperacillin sod-tazobactam so]; Amlodipine besy-benazepril hcl; and Amoxicillin   Social History   Socioeconomic History  . Marital status: Married    Spouse name: Not on file  . Number of children: Not on file  . Years of education: Not on file  . Highest education level: Not on file  Occupational History  . 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 Financial resource strain: Not on file  . Food insecurity:    Worry: Not on file    Inability: Not on file  . Transportation needs:    Medical: Not on file    Non-medical: Not on file  Tobacco Use  . Smoking status: Never Smoker  . Smokeless tobacco: Never Used  Substance and Sexual Activity  . Alcohol use: No    Alcohol/week: 0.0 oz  . Drug use: No  . Sexual activity: Not Currently  Lifestyle  . Physical activity:    Days per week: Not on file    Minutes per session: Not on file  . Stress: Not on file  Relationships  . Social connections:    Talks on phone: Not on file    Gets together: Not on file    Attends religious service: Not on file    Active member of club or organization: Not on file    Attends meetings of clubs or organizations: Not on file    Relationship status: Not on file  Other Topics Concern  . Not on file  Social History Narrative   Lives with husband in a one story home.  Had one child that only lived for 14 hours.     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          Epworth Sleepiness Scale = 2 (as of 03/14/2015)     Family History:  The patient's family history includes Cancer in her father; Heart attack (age of onset: 27) in her mother; Heart failure in her mother; Hypertension in her sister; Hyperthyroidism in her sister and sister; Lung cancer in her father; Stroke in her paternal grandmother.   ROS:   Please see the history of present illness.    ROS All other systems reviewed and are negative.   PHYSICAL EXAM:   VS:  BP 112/62   Pulse 68   Ht 5\' 2"  (1.575 m)   Wt 256 lb 12.8 oz (116.5 kg)   BMI 46.97 kg/m    GEN: Well nourished, well developed, in no acute distress  HEENT: normal  Neck: no JVD, carotid bruits, or masses Cardiac: RRR; no murmurs, rubs, or gallops,no edema  Respiratory:  clear to auscultation bilaterally, normal work of breathing GI: soft, nontender, nondistended, + BS MS: no deformity or atrophy  Skin: warm and dry, no rash Neuro:  Alert and Oriented x 3, Strength and sensation are intact Psych: euthymic mood, full affect  Wt Readings from Last 3 Encounters:  04/08/17 256 lb 12.8 oz (116.5 kg)  03/26/17 271 lb 2.7 oz (123 kg)  03/11/17 275 lb (124.7 kg)      Studies/Labs Reviewed:   EKG:  EKG is ordered today.  The ekg ordered today demonstrates normal sinus rhythm, baseline motion artifact  Recent Labs: 10/12/2016: ALT 26 03/21/2017: BUN 10; Creatinine, Ser 0.83; Potassium 4.3; Sodium 139 03/24/2017: TSH 3.152 03/26/2017: Hemoglobin 8.4; Platelets 356   Lipid Panel    Component Value Date/Time   CHOL 256 (H) 08/09/2014 0928   TRIG 180 (H) 08/09/2014 0928   HDL 74 08/09/2014 0928   LDLCALC 146 (H) 08/09/2014 0928    Additional studies/ records that were reviewed  today include:   Echo 05/26/15: Study Conclusions  - Left ventricle: The cavity size was mildly dilated. Systolic function was normal. The estimated ejection fraction was in the range of 60% to 65%. Wall motion was normal; there were no regional wall motion abnormalities. Left ventricular diastolic function parameters were normal. - Aortic valve: Transvalvular velocity was within the normal range. There was no stenosis. There was no regurgitation. - Mitral valve: There was no regurgitation. - Left atrium: The atrium was mildly dilated. - Right ventricle: The cavity size was normal. Wall thickness was normal. Systolic function was normal. - Tricuspid valve: There was no regurgitation. - Inferior vena cava: The vessel was normal in size. The respirophasic diameter changes were in the normal range (= 50%), consistent with normal central venous pressure.   28 Day Event Monitor 04/01/15:  Quality: Fair. Baseline artifact. Predominant rhythm: sinus rhythm Average heart rate: 70 bpm Max heart rate: 126 bpm Min heart rate: 50 bpm  Short runs of atrial tachycardia lasting up to 11 seconds. One 6 beat run of NSVT.    Lexiscan Cardiolite 03/28/15: 1. Nuclear stress EF: 71%. 2. The left ventricular ejection fraction is hyperdynamic (>65%). 3. There was no ST segment deviation noted during stress. 4. This is an intermediate risk study. 5. Findings  consistent with ischemia.  Technically difficult study due to increased subdiaphragmatic activity; intermediate risk with small, severe, reversible defects in the apical and inferior basal walls consistent with ischemia; EF 71 with normal wall motion.   LHC 04/17/15: The left ventricular systolic function is normal. Angiographically normal coronary arteries, but very tortuous  Angiographically no evidence of any significant lesions to explain the patient's abnormal stress test. There does appear to be mild  calcification in the LAD distribution, but certainly not on the luminal aspect.      ASSESSMENT:    1. Paroxysmal atrial fibrillation (HCC)   2. Medication management   3. AKI (acute kidney injury) ([place])   4. Anemia, unspecified type   5. Essential hypertension   6. Myasthenia gravis ([place])   7. Coronary artery calcification   8. Hyperlipidemia, unspecified hyperlipidemia type      PLAN:  In order of problems listed above:  1. Paroxysmal atrial fibrillation: Occurred postop, likely related to surgery.  We will decrease amiodarone to 200 mg daily and discontinue amiodarone after 1 month.  Afterward, she will wear 30-day event monitor, if no recurrence of atrial fibrillation, I think it would be reasonable for her to come off of Eliquis.  She is not on AV nodal blocking agent given her history of myasthenia gravis.  2. Morning dizziness: also associated with nausea, unclear cause, but may be related to amiodarone.  I did obtain lab work to rule out a secondary causes as well.  3. AKI: Obtain basic metabolic panel  4. Anemia: Now that she is on Eliquis and has been having some dizziness recently, I will obtain a CBC  5. Hypertension: Blood pressure 112/62.  Well controlled on current medication  6. Hyperlipidemia: She is not on a statin, previous LDL in July 2016 was 146.  7. Coronary artery calcification: Seen on previous CT, however cardiac catheterization in 2017 showed clean coronaries.  8. Myasthenia gravis: Managed by primary care    Medication Adjustments/Labs and Tests Ordered: Current medicines are reviewed at length with the patient today.  Concerns regarding medicines are outlined above.  Medication changes, Labs and Tests ordered today are listed in the Patient Instructions below. Patient Instructions  Medication Instructions:  Your physician has recommended you make the following change in your medication:  1) DECREASE Amiodarone to 200 mg by  mouth ONCE  Daily  for ONE MONTH, then STOP taking medication; last does on April 30th.    Labwork: Your physician recommends that you return for lab work in: TODAY (Bmet/CBC)   Testing/Procedures: Your physician has recommended that you wear an event monitor. Event monitors are medical devices that record the heart's electrical activity. Doctors most often [place] these monitors to diagnose arrhythmias. Arrhythmias are problems with the speed or rhythm of the heartbeat. The monitor is a small, portable device. You can wear one while you do your normal daily activities. This is usually used to diagnose what is causing palpitations/syncope (passing out). SCHEDULE IN 1 MONTH - SCHEDULE SECOND WEEK OF MAY    Follow-Up: Your physician recommends that you schedule a follow-up appointment in: 3 MONTHS WITH DR. [place] -    Any Other Special Instructions Will Be Listed Below (If Applicable).     If you need a refill on your cardiac medications before your next appointment, please call your pharmacy.      Hilbert Corrigan, [place]  04/08/2017 1:10 PM    [facility] [address],  [place]  [address] Phone: [phone]; Fax: [phone]

## 2017-04-08 NOTE — Patient Instructions (Signed)
Medication Instructions:  Your physician has recommended you make the following change in your medication:  1) DECREASE Amiodarone to 200 mg by  mouth ONCE  Daily for ONE MONTH, then STOP taking medication; last does on April 30th.    Labwork: Your physician recommends that you return for lab work in: TODAY (Bmet/CBC)   Testing/Procedures: Your physician has recommended that you wear an event monitor. Event monitors are medical devices that record the heart's electrical activity. Doctors most often [place] these monitors to diagnose arrhythmias. Arrhythmias are problems with the speed or rhythm of the heartbeat. The monitor is a small, portable device. You can wear one while you do your normal daily activities. This is usually used to diagnose what is causing palpitations/syncope (passing out). SCHEDULE IN 1 MONTH - SCHEDULE SECOND WEEK OF MAY    Follow-Up: Your physician recommends that you schedule a follow-up appointment in: 3 MONTHS WITH DR. Cricket -    Any Other Special Instructions Will Be Listed Below (If Applicable).     If you need a refill on your cardiac medications before your next appointment, please call your pharmacy.

## 2017-04-11 DIAGNOSIS — G7000 Myasthenia gravis without (acute) exacerbation: Secondary | ICD-10-CM | POA: Diagnosis not present

## 2017-04-11 DIAGNOSIS — K5790 Diverticulosis of intestine, part unspecified, without perforation or abscess without bleeding: Secondary | ICD-10-CM | POA: Diagnosis not present

## 2017-04-11 DIAGNOSIS — I4891 Unspecified atrial fibrillation: Secondary | ICD-10-CM | POA: Diagnosis not present

## 2017-04-11 DIAGNOSIS — I2584 Coronary atherosclerosis due to calcified coronary lesion: Secondary | ICD-10-CM | POA: Diagnosis not present

## 2017-04-11 DIAGNOSIS — Z48815 Encounter for surgical aftercare following surgery on the digestive system: Secondary | ICD-10-CM | POA: Diagnosis not present

## 2017-04-11 DIAGNOSIS — M170 Bilateral primary osteoarthritis of knee: Secondary | ICD-10-CM | POA: Diagnosis not present

## 2017-04-12 ENCOUNTER — Other Ambulatory Visit: Payer: Self-pay

## 2017-04-12 DIAGNOSIS — I472 Ventricular tachycardia: Secondary | ICD-10-CM

## 2017-04-12 DIAGNOSIS — I4729 Other ventricular tachycardia: Secondary | ICD-10-CM

## 2017-04-12 DIAGNOSIS — I10 Essential (primary) hypertension: Secondary | ICD-10-CM

## 2017-04-12 MED ORDER — FUROSEMIDE 40 MG PO TABS: 20.0000 mg | ORAL_TABLET | Freq: Every day | ORAL | 1 refills | Status: DC

## 2017-04-13 ENCOUNTER — Telehealth: Payer: Self-pay | Admitting: Cardiovascular Disease

## 2017-04-13 NOTE — Telephone Encounter (Signed)
Phone busy.

## 2017-04-13 NOTE — Telephone Encounter (Signed)
SPOKE TO PATIENT. SHE HAD QUESTION CONCERNING CALL ABOUT HER LABS. CREAT 1.61 REDUCE LASIX TO 20 MG DAILY RECHECK IN 1 WEEK - BMP  CONTINUE SPIROLACTONE. INFORMATION GIVEN ON WHERE TO COME FOR LAB WORK  VERBALIZED UNDERSTANDING.

## 2017-04-13 NOTE — Telephone Encounter (Signed)
New Message    Patient has questions on which medication she is suppose to take or hold

## 2017-04-14 DIAGNOSIS — G7000 Myasthenia gravis without (acute) exacerbation: Secondary | ICD-10-CM | POA: Diagnosis not present

## 2017-04-14 DIAGNOSIS — I4891 Unspecified atrial fibrillation: Secondary | ICD-10-CM | POA: Diagnosis not present

## 2017-04-14 DIAGNOSIS — M170 Bilateral primary osteoarthritis of knee: Secondary | ICD-10-CM | POA: Diagnosis not present

## 2017-04-14 DIAGNOSIS — Z48815 Encounter for surgical aftercare following surgery on the digestive system: Secondary | ICD-10-CM | POA: Diagnosis not present

## 2017-04-14 DIAGNOSIS — K5790 Diverticulosis of intestine, part unspecified, without perforation or abscess without bleeding: Secondary | ICD-10-CM | POA: Diagnosis not present

## 2017-04-14 DIAGNOSIS — I2584 Coronary atherosclerosis due to calcified coronary lesion: Secondary | ICD-10-CM | POA: Diagnosis not present

## 2017-04-16 ENCOUNTER — Other Ambulatory Visit: Payer: Self-pay | Admitting: Internal Medicine

## 2017-04-18 ENCOUNTER — Telehealth: Payer: Self-pay | Admitting: Emergency Medicine

## 2017-04-18 DIAGNOSIS — G7000 Myasthenia gravis without (acute) exacerbation: Secondary | ICD-10-CM | POA: Diagnosis not present

## 2017-04-18 DIAGNOSIS — K5790 Diverticulosis of intestine, part unspecified, without perforation or abscess without bleeding: Secondary | ICD-10-CM | POA: Diagnosis not present

## 2017-04-18 DIAGNOSIS — Z48815 Encounter for surgical aftercare following surgery on the digestive system: Secondary | ICD-10-CM | POA: Diagnosis not present

## 2017-04-18 DIAGNOSIS — I2584 Coronary atherosclerosis due to calcified coronary lesion: Secondary | ICD-10-CM | POA: Diagnosis not present

## 2017-04-18 DIAGNOSIS — I4891 Unspecified atrial fibrillation: Secondary | ICD-10-CM | POA: Diagnosis not present

## 2017-04-18 DIAGNOSIS — M170 Bilateral primary osteoarthritis of knee: Secondary | ICD-10-CM | POA: Diagnosis not present

## 2017-04-18 NOTE — Telephone Encounter (Signed)
Called patient to schedule AWV. Patient stated she will give office a call back.

## 2017-04-20 DIAGNOSIS — I4891 Unspecified atrial fibrillation: Secondary | ICD-10-CM | POA: Diagnosis not present

## 2017-04-20 DIAGNOSIS — Z48815 Encounter for surgical aftercare following surgery on the digestive system: Secondary | ICD-10-CM | POA: Diagnosis not present

## 2017-04-20 DIAGNOSIS — K5790 Diverticulosis of intestine, part unspecified, without perforation or abscess without bleeding: Secondary | ICD-10-CM | POA: Diagnosis not present

## 2017-04-20 DIAGNOSIS — I2584 Coronary atherosclerosis due to calcified coronary lesion: Secondary | ICD-10-CM | POA: Diagnosis not present

## 2017-04-20 DIAGNOSIS — G7000 Myasthenia gravis without (acute) exacerbation: Secondary | ICD-10-CM | POA: Diagnosis not present

## 2017-04-20 DIAGNOSIS — M170 Bilateral primary osteoarthritis of knee: Secondary | ICD-10-CM | POA: Diagnosis not present

## 2017-04-22 DIAGNOSIS — I10 Essential (primary) hypertension: Secondary | ICD-10-CM | POA: Diagnosis not present

## 2017-04-22 DIAGNOSIS — I472 Ventricular tachycardia: Secondary | ICD-10-CM | POA: Diagnosis not present

## 2017-04-23 LAB — BASIC METABOLIC PANEL
BUN / CREAT RATIO: 16 (ref 12–28)
BUN: 17 mg/dL (ref 8–27)
CHLORIDE: 98 mmol/L (ref 96–106)
CO2: 26 mmol/L (ref 20–29)
Calcium: 8.9 mg/dL (ref 8.7–10.3)
Creatinine, Ser: 1.04 mg/dL — ABNORMAL HIGH (ref 0.57–1.00)
GFR calc non Af Amer: 55 mL/min/{1.73_m2} — ABNORMAL LOW (ref >59)
GFR, EST AFRICAN AMERICAN: 63 mL/min/{1.73_m2} (ref >59)
Glucose: 89 mg/dL (ref 65–99)
POTASSIUM: 3.8 mmol/L (ref 3.5–5.2)
SODIUM: 135 mmol/L (ref 134–144)

## 2017-04-27 DIAGNOSIS — K5790 Diverticulosis of intestine, part unspecified, without perforation or abscess without bleeding: Secondary | ICD-10-CM | POA: Diagnosis not present

## 2017-04-27 DIAGNOSIS — M170 Bilateral primary osteoarthritis of knee: Secondary | ICD-10-CM | POA: Diagnosis not present

## 2017-04-27 DIAGNOSIS — Z48815 Encounter for surgical aftercare following surgery on the digestive system: Secondary | ICD-10-CM | POA: Diagnosis not present

## 2017-04-27 DIAGNOSIS — I4891 Unspecified atrial fibrillation: Secondary | ICD-10-CM | POA: Diagnosis not present

## 2017-04-27 DIAGNOSIS — G7000 Myasthenia gravis without (acute) exacerbation: Secondary | ICD-10-CM | POA: Diagnosis not present

## 2017-04-27 DIAGNOSIS — I2584 Coronary atherosclerosis due to calcified coronary lesion: Secondary | ICD-10-CM | POA: Diagnosis not present

## 2017-04-28 ENCOUNTER — Other Ambulatory Visit: Payer: Self-pay | Admitting: Internal Medicine

## 2017-04-28 ENCOUNTER — Encounter: Payer: Self-pay | Admitting: *Deleted

## 2017-05-02 DIAGNOSIS — G7000 Myasthenia gravis without (acute) exacerbation: Secondary | ICD-10-CM | POA: Diagnosis not present

## 2017-05-02 DIAGNOSIS — M170 Bilateral primary osteoarthritis of knee: Secondary | ICD-10-CM | POA: Diagnosis not present

## 2017-05-02 DIAGNOSIS — Z48815 Encounter for surgical aftercare following surgery on the digestive system: Secondary | ICD-10-CM | POA: Diagnosis not present

## 2017-05-02 DIAGNOSIS — I2584 Coronary atherosclerosis due to calcified coronary lesion: Secondary | ICD-10-CM | POA: Diagnosis not present

## 2017-05-02 DIAGNOSIS — I4891 Unspecified atrial fibrillation: Secondary | ICD-10-CM | POA: Diagnosis not present

## 2017-05-02 DIAGNOSIS — K5790 Diverticulosis of intestine, part unspecified, without perforation or abscess without bleeding: Secondary | ICD-10-CM | POA: Diagnosis not present

## 2017-05-08 ENCOUNTER — Other Ambulatory Visit: Payer: Self-pay | Admitting: Internal Medicine

## 2017-05-09 ENCOUNTER — Other Ambulatory Visit: Payer: Self-pay | Admitting: Internal Medicine

## 2017-05-14 ENCOUNTER — Other Ambulatory Visit: Payer: Self-pay | Admitting: Internal Medicine

## 2017-05-23 ENCOUNTER — Other Ambulatory Visit: Payer: Self-pay | Admitting: Rheumatology

## 2017-05-23 NOTE — Telephone Encounter (Signed)
Last visit: 02/09/17 Next Visit due July 2019. Message sent to the front to schedule patient.  Okay to refill per Dr. Estanislado Pandy

## 2017-05-25 ENCOUNTER — Ambulatory Visit (INDEPENDENT_AMBULATORY_CARE_PROVIDER_SITE_OTHER): Payer: Medicare Other

## 2017-05-25 ENCOUNTER — Encounter (INDEPENDENT_AMBULATORY_CARE_PROVIDER_SITE_OTHER): Payer: Self-pay

## 2017-05-25 DIAGNOSIS — I480 Paroxysmal atrial fibrillation: Secondary | ICD-10-CM

## 2017-05-26 ENCOUNTER — Other Ambulatory Visit: Payer: Self-pay | Admitting: Cardiovascular Disease

## 2017-05-26 ENCOUNTER — Telehealth: Payer: Self-pay | Admitting: Cardiovascular Disease

## 2017-05-26 NOTE — Telephone Encounter (Signed)
Spoke with pt and advised the refill for Eliquis has already been sent over to the pharmacy. Pt verbalized understanding.

## 2017-05-26 NOTE — Telephone Encounter (Signed)
New Message    *STAT* If patient is at the pharmacy, call can be transferred to refill team.   1. Which medications need to be refilled? (please list name of each medication and dose if known) apixaban (ELIQUIS) 5 MG TABS tablet  2. Which pharmacy/location (including street and city if local pharmacy) is medication to be sent to? [id number] [place], [place] [address]  3. Do they need a 30 day or 90 day supply?  [place]

## 2017-06-07 ENCOUNTER — Other Ambulatory Visit: Payer: Self-pay | Admitting: Internal Medicine

## 2017-06-07 ENCOUNTER — Other Ambulatory Visit: Payer: Self-pay | Admitting: Rheumatology

## 2017-06-07 DIAGNOSIS — I10 Essential (primary) hypertension: Secondary | ICD-10-CM

## 2017-07-15 NOTE — Progress Notes (Deleted)
Office Visit Note  Patient: Kimberly [name]             Date of Birth: 07/25/1945           MRN: [id number]             PCP: [name], MD Referring: [name], MD Visit Date: 07/29/2017 Occupation: @GUAROCC @    Subjective:  No chief complaint on file.   History of Present Illness: Kimberly [name] is a 72 y.o. female ***   Activities of Daily Living:  Patient reports morning stiffness for *** {minute/hour:19697}.   Patient {ACTIONS;DENIES/REPORTS:21021675::"Denies"} nocturnal pain.  Difficulty dressing/grooming: {ACTIONS;DENIES/REPORTS:21021675::"Denies"} Difficulty climbing stairs: {ACTIONS;DENIES/REPORTS:21021675::"Denies"} Difficulty getting out of chair: {ACTIONS;DENIES/REPORTS:21021675::"Denies"} Difficulty using hands for taps, buttons, cutlery, and/or writing: {ACTIONS;DENIES/REPORTS:21021675::"Denies"}   No Rheumatology ROS completed.   PMFS History:  Patient Active Problem List   Diagnosis Date Noted  . Diverticular stricture ([place]) 03/18/2017  . Skin erythema 11/01/2016  . Sleep difficulties 10/31/2016  . Pelvic abscess in female 10/11/2016  . Bilateral leg edema 08/20/2016  . Recurrent UTI 08/15/2016  . Psoriasis 04/13/2016  . History of diverticulitis 04/13/2016  . Osteopenia of multiple sites 04/13/2016  . DJD (degenerative joint disease), cervical 04/07/2016  . Primary osteoarthritis of both knees 04/07/2016  . Decreased abduction of right shoulder joint 04/07/2016  . Atrial tachycardia ([place]) 05/12/2015  . NSVT (nonsustained ventricular tachycardia) ([place]) 05/12/2015  . Abnormal nuclear stress test - INTERMEDIATE RISK 04/14/2015  . Coronary artery calcification seen on CAT scan 02/28/2015  . Left lower quadrant pain 02/17/2015  . Hyperglycemia 08/09/2014  . Dyspnea 03/20/2013  . Hypokalemia 12/31/2012  . Umbilical hernia 08/24/4816  . Pleural effusion 05/10/2012  . Myasthenia gravis without exacerbation ([place]) 02/12/2010  . DIPLOPIA  11/21/2009  . Vitamin D deficiency 06/25/2008  . Hyperlipidemia 06/25/2008  . Essential hypertension 06/25/2008  . Leukocytoclastic vasculitis ([place]) 06/19/2007  . MUSCLE WEAKNESS (GENERALIZED) 02/10/2007  . Unspecified Myalgia and Myositis 01/24/2007  . EDEMA- LOCALIZED 01/24/2007  . ANA POSITIVE, HX OF 01/24/2007    Past Medical History:  Diagnosis Date  . Allergy   . Aortic atherosclerosis ([place])   . Atrial tachycardia ([place]) 05/12/2015  . Cataract   . Colovesical fistula   . Diverticulitis   . Diverticulosis   . DJD (degenerative joint disease), cervical   . Frequent UTI   . GERD (gastroesophageal reflux disease)   . History of hiatal hernia    Small hernia  . Hypertension   . Internal hemorrhoids 01/21/2017   noted on colonopscopy  . Leukocytoclastic vasculitis ([place])   . Myalgia and myositis, unspecified   . Myasthenia gravis without exacerbation (HCC)    [facility], Dr Anna Genre  . NSVT (nonsustained ventricular tachycardia) ([place]) 05/12/2015  . Osteoarthritis    Both knees  . Osteopenia   . Other and unspecified hyperlipidemia   . Periumbilical hernia   . Pneumonia 04/2016  . PONV (postoperative nausea and vomiting)   . Psoriasis    plantar ;Dr Jarome Matin  . Sleep difficulties   . Vasculitis ([place])    Dr Estanislado Pandy  . Vitamin D deficiency     Family History  Problem Relation Age of Onset  . Heart attack Mother 75  . Heart failure Mother   . Stroke Paternal Grandmother        in 13s  . Lung cancer Father   . Cancer Father        lung  . Hypertension Sister   .  Hyperthyroidism Sister        Hutson Luft  . Hyperthyroidism Sister        Trayquan Kolakowski  . Diabetes Neg Hx   . Colon cancer Neg Hx   . Esophageal cancer Neg Hx   . Stomach cancer Neg Hx   . Rectal cancer Neg Hx    Past Surgical History:  Procedure Laterality Date  . APPENDECTOMY     & exploratory for inflammation; Dr Hassell Done  . CARDIAC CATHETERIZATION N/A 04/17/2015   Procedure: Left Heart Cath and Coronary  Angiography;  Surgeon: Leonie Man, MD;  Location: [facility];  Service: Cardiovascular;  Laterality: N/A;  . CERVICAL FUSION     C5-6; Dr Vertell Limber  . CHOLECYSTECTOMY     for stones  . COLONOSCOPY W/ POLYPECTOMY  01/21/2017  . CYSTOSCOPY WITH STENT PLACEMENT Bilateral 03/18/2017   Procedure: CYSTOSCOPY WITH RETROGRADE AND BILATERAL URETERAL STENT PLACEMENT;  Surgeon: Lucas Mallow, MD;  [facility];  Service: Urology;  Laterality: Bilateral;  . IR RADIOLOGIST EVAL & MGMT  11/16/2016  . IR RADIOLOGIST EVAL & MGMT  12/08/2016  . IR RADIOLOGIST EVAL & MGMT  12/16/2016  . LAMINECTOMY  2010   T1-2; Dr Vertell Limber  . PARTIAL COLECTOMY N/A 03/18/2017   Procedure: OPEN SIGMOIDECTOMY;  Surgeon: Leighton Ruff, MD;  Location: [facility];  Service: General;  Laterality: N/A;  . SHOULDER SURGERY     Left & Right  . THORACENTESIS     for peripneumonic effusion  . TOTAL ABDOMINAL HYSTERECTOMY W/ BILATERAL SALPINGOOPHORECTOMY     For Fibroids & endometriosis  . UMBILICAL HERNIA REPAIR N/A 03/18/2017   Procedure: OPEN HERNIA REPAIR UMBILICAL ADULT;  Surgeon: Leighton Ruff, MD;  Location: [facility];  Service: General;  Laterality: N/A;  ERAs pathway   Social History   Social History Narrative   Lives with husband in a one story home.  Had one child that only lived for 14 hours.     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          Epworth Sleepiness Scale = 2 (as of 03/14/2015)     Objective: Vital Signs: There were no vitals taken for this visit.   Physical Exam   Musculoskeletal Exam: ***  CDAI Exam: No CDAI exam completed.    Investigation: No additional findings. CBC Latest Ref Rng & Units 04/08/2017 03/26/2017 03/25/2017  WBC 3.4 - 10.8 x10E3/uL 9.6 10.7(H) 10.7(H)  Hemoglobin 11.1 - 15.9 g/dL 9.8(L) 8.4(L) 8.4(L)  Hematocrit 34.0 - 46.6 % 30.5(L) 28.4(L) 26.9(L)  Platelets 150 - 379 x10E3/uL 481(H) 356 401(H)   CMP Latest Ref Rng & Units  04/22/2017 04/08/2017 03/21/2017  Glucose 65 - 99 mg/dL 89 93 98  BUN 8 - 27 mg/dL 17 26 10   Creatinine 0.57 - 1.00 mg/dL 1.04(H) 1.61(H) 0.83  Sodium 134 - 144 mmol/L 135 140 139  Potassium 3.5 - 5.2 mmol/L 3.8 3.9 4.3  Chloride 96 - 106 mmol/L 98 96 107  CO2 20 - 29 mmol/L 26 28 28   Calcium 8.7 - 10.3 mg/dL 8.9 10.3 7.7(L)  Total Protein 6.5 - 8.1 g/dL - - -  Total Bilirubin 0.3 - 1.2 mg/dL - - -  Alkaline Phos 38 - 126 U/L - - -  AST 15 - 41 U/L - - -  ALT 14 - 54 U/L - - -   Imaging: No results found.  Speciality Comments: No specialty comments available.  Procedures:  No procedures performed Allergies: Azithromycin; Ibuprofen; Levaquin [levofloxacin in d5w]; Tetracycline; Isovue [iopamidol]; Sulfonamide derivatives; Aleve [naproxen]; Tape; Zosyn [piperacillin sod-tazobactam so]; Amlodipine besy-benazepril hcl; and Amoxicillin   Assessment / Plan:     Visit Diagnoses: No diagnosis found.    Orders: No orders of the defined types were placed in this encounter.  No orders of the defined types were placed in this encounter.   Face-to-face time spent with patient was *** minutes. Greater than 50% of time was spent in counseling and coordination of care.  Follow-Up Instructions: No follow-ups on file.   Earnestine Mealing, CMA  Note - This record has been created using Editor, commissioning.  Chart creation errors have been sought, but may not always  have been located. Such creation errors do not reflect on  the standard of medical care.

## 2017-07-16 ENCOUNTER — Other Ambulatory Visit: Payer: Self-pay | Admitting: Cardiovascular Disease

## 2017-07-21 ENCOUNTER — Telehealth: Payer: Self-pay | Admitting: *Deleted

## 2017-07-21 NOTE — Telephone Encounter (Signed)
-----   Message from Skeet Latch, MD sent at 07/21/2017  5:29 PM EDT ----- OK to stop Eliquis.   ----- Message ----- From: Earvin Hansen, LPN Sent: 2/84/0698   5:05 PM To: Skeet Latch, MD  Hello Dr Oval Linsey, I spoke with [facility] regarding her monitor, no arrhythmias. She was wanting to know if she needed to continue her Eliquis.  Thanks [employer]

## 2017-07-21 NOTE — Telephone Encounter (Signed)
Advised patient, verbalized understanding  

## 2017-07-26 ENCOUNTER — Ambulatory Visit (INDEPENDENT_AMBULATORY_CARE_PROVIDER_SITE_OTHER): Payer: Medicare Other | Admitting: Physician Assistant

## 2017-07-26 ENCOUNTER — Encounter: Payer: Self-pay | Admitting: Physician Assistant

## 2017-07-26 ENCOUNTER — Ambulatory Visit: Payer: Medicare Other | Admitting: Cardiovascular Disease

## 2017-07-26 VITALS — BP 118/70 | HR 70 | Ht 62.0 in | Wt 268.0 lb

## 2017-07-26 DIAGNOSIS — I10 Essential (primary) hypertension: Secondary | ICD-10-CM

## 2017-07-26 DIAGNOSIS — I480 Paroxysmal atrial fibrillation: Secondary | ICD-10-CM

## 2017-07-26 DIAGNOSIS — G7000 Myasthenia gravis without (acute) exacerbation: Secondary | ICD-10-CM | POA: Diagnosis not present

## 2017-07-26 DIAGNOSIS — E785 Hyperlipidemia, unspecified: Secondary | ICD-10-CM

## 2017-07-26 DIAGNOSIS — I2510 Atherosclerotic heart disease of native coronary artery without angina pectoris: Secondary | ICD-10-CM

## 2017-07-26 DIAGNOSIS — I2584 Coronary atherosclerosis due to calcified coronary lesion: Secondary | ICD-10-CM

## 2017-07-26 NOTE — Patient Instructions (Signed)
Medication Instructions:  Your physician recommends that you continue on your current medications as directed. Please refer to the Current Medication list given to you today.  Labwork: None   Testing/Procedures: None   Follow-Up: Your physician recommends that you schedule a follow-up appointment in: 4-6 months with Dr Oval Linsey.  Any Other Special Instructions Will Be Listed Below (If Applicable). If you need a refill on your cardiac medications before your next appointment, please call your pharmacy.

## 2017-07-26 NOTE — Progress Notes (Signed)
Cardiology Office Note    Date:  07/28/2017   ID:  Kimberly [name], DOB [birth year], MRN [id number]  PCP:  [name], MD  Cardiologist:  Dr. Oval Linsey  Chief Complaint  Patient presents with  . 3 month follow-up visit    pt c/o SOB with exertion--nothing new    History of Present Illness:  Kimberly [name] is a 72 y.o. female with PMH of HTN, myasthenia gravis, coronary artery calcification, recurrent right pleural effusion, hyperlipidemia.  She was noted to have coronary calcification on previous CT test used to follow-up on pleural effusion.  She is largely asymptomatic and does not exercise much due to myasthenia gravis.  Myoview obtained in March 2017 was concerning for inferior and apical ischemia.  She subsequently underwent cardiac catheterization in 04/2015 which showed normal but tortuous coronary arteries.  30-day event monitor showed paroxysmal atrial tachycardia and SVT.  She was not started on AV nodal agents due to history of myasthenia.  Echocardiogram in May 2017 showed EF 60-65% with normal diastolic function.  She was seen by Dr. Oval Linsey in September 2018, and noted to have right lower extremity lymphedema.  She was admitted in October 2018 after found to have a pelvic abscess measuring 3.7 cm and a colovesical fistula secondary to diverticulitis.  She was treated with IV antibiotic and the percutaneous drain placement by interventional radiology.  She underwent open sigmoidectomy, umbilical hernia repair, cystoscopy with retrograde and bilateral ureteral stent placement on 03/19/2017. On postoperative days #6, she was noted to be in atrial fibrillation with RVR, this was confirmed by EKG as well.  Heart rate was in the 160s.  Cardiology service was consulted.  Patient was placed on IV amiodarone with successful conversion to sinus rhythm.  She was eventually discharged home on 200 mg twice daily of amiodarone.  She was also placed on Eliquis.  The recommendation is to confirm  the absence of atrial fibrillation as outpatient with event monitor and potentially DC Eliquis if she does not have any recurrence.  The current bout of atrial fibrillation was felt to be brought on by the stress from the surgery.  We will continue to avoid AV nodal agent in the setting of myasthenia gravis.  I last saw the patient on 04/08/2017.  I already wean her off of amiodarone.  A 30-day event monitor obtained since then did not show any recurrence of atrial fibrillation.  Her Eliquis has been discontinued since then.  She has not noticed any recurrence of palpitations since the last office visit.  Her volume status is stable on the current dose of diuretic.  She has been feeling very well.  Lab work obtained in April 2019 showed normalization of renal function.     Past Medical History:  Diagnosis Date  . Allergy   . Aortic atherosclerosis ([place])   . Atrial tachycardia ([place]) 05/12/2015  . Cataract   . Colovesical fistula   . Diverticulitis   . Diverticulosis   . DJD (degenerative joint disease), cervical   . Frequent UTI   . GERD (gastroesophageal reflux disease)   . History of hiatal hernia    Small hernia  . Hypertension   . Internal hemorrhoids 01/21/2017   noted on colonopscopy  . Leukocytoclastic vasculitis ([place])   . Myalgia and myositis, unspecified   . Myasthenia gravis without exacerbation (HCC)    [facility], Dr Anna Genre  . NSVT (nonsustained ventricular tachycardia) ([place]) 05/12/2015  . Osteoarthritis    Both knees  .  Osteopenia   . Other and unspecified hyperlipidemia   . Periumbilical hernia   . Pneumonia 04/2016  . PONV (postoperative nausea and vomiting)   . Psoriasis    plantar ;Dr Jarome Matin  . Sleep difficulties   . Vasculitis ([place])    Dr Estanislado Pandy  . Vitamin D deficiency     Past Surgical History:  Procedure Laterality Date  . APPENDECTOMY     & exploratory for inflammation; Dr Hassell Done  . CARDIAC CATHETERIZATION N/A 04/17/2015   Procedure: Left Heart Cath and  Coronary Angiography;  Surgeon: Leonie Man, MD;  Location: [facility];  Service: Cardiovascular;  Laterality: N/A;  . CERVICAL FUSION     C5-6; Dr Vertell Limber  . CHOLECYSTECTOMY     for [name]  . COLONOSCOPY W/ POLYPECTOMY  01/21/2017  . CYSTOSCOPY WITH STENT PLACEMENT Bilateral 03/18/2017   Procedure: CYSTOSCOPY WITH RETROGRADE AND BILATERAL URETERAL STENT PLACEMENT;  Surgeon: Lucas Mallow, MD;  [facility];  Service: Urology;  Laterality: Bilateral;  . IR RADIOLOGIST EVAL & MGMT  11/16/2016  . IR RADIOLOGIST EVAL & MGMT  12/08/2016  . IR RADIOLOGIST EVAL & MGMT  12/16/2016  . LAMINECTOMY  2010   T1-2; Dr Vertell Limber  . PARTIAL COLECTOMY N/A 03/18/2017   Procedure: OPEN SIGMOIDECTOMY;  Surgeon: Leighton Ruff, MD;  [facility];  Service: General;  Laterality: N/A;  . SHOULDER SURGERY     Left & Right  . THORACENTESIS     for peripneumonic effusion  . TOTAL ABDOMINAL HYSTERECTOMY W/ BILATERAL SALPINGOOPHORECTOMY     For Fibroids & endometriosis  . UMBILICAL HERNIA REPAIR N/A 03/18/2017   Procedure: OPEN HERNIA REPAIR UMBILICAL ADULT;  Surgeon: Leighton Ruff, MD;  [facility];  Service: General;  Laterality: N/A;  ERAs pathway    Current Medications: Outpatient Medications Prior to Visit  Medication Sig Dispense Refill  . acetaminophen (TYLENOL) 325 MG tablet Take 2 tablets (650 mg total) by mouth every 6 (six) hours as needed for mild pain (or temp > 100).    Marland Kitchen augmented betamethasone dipropionate (DIPROLENE-AF) 0.05 % ointment Apply 1 application topically as needed. For vasculitis and psoriasis     . CALCIUM PO Take 1,000 mg by mouth at bedtime.    . cholecalciferol (VITAMIN D) 1000 units tablet Take 1,000 Units by mouth at bedtime.    . folic acid (FOLVITE) 1 MG tablet TAKE 2 TABLETS BY MOUTH ONCE DAILY (Patient taking differently: TAKE 2 TABLETS BY MOUTH ONCE DAILY AT NIGHT) 180 tablet 4  . furosemide (LASIX) 40 MG tablet Take 0.5 tablets (20 mg total) by mouth  daily. 90 tablet 1  . losartan (COZAAR) 100 MG tablet Take 1 tablet (100 mg total) by mouth daily. 90 tablet 3  . Multiple Vitamin (MULTIVITAMIN WITH MINERALS) TABS tablet Take 1 tablet by mouth at bedtime.    . predniSONE (DELTASONE) 5 MG tablet TAKE 1 TABLET BY MOUTH EVERY DAY WITH BREAKFAST 90 tablet 0  . pyridostigmine (MESTINON) 60 MG tablet Take 1 tablet (60 mg total) by mouth 3 (three) times daily. 270 tablet 3  . pyridOXINE (VITAMIN B-6) 100 MG tablet Take 100 mg by mouth at bedtime.    Marland Kitchen spironolactone (ALDACTONE) 25 MG tablet TAKE 1 TABLET BY MOUTH EVERY DAY 90 tablet 3  . vitamin B-12 (CYANOCOBALAMIN) 1000 MCG tablet Take 1,000 mcg by mouth at bedtime.    Marland Kitchen amiodarone (PACERONE) 200 MG tablet Take 1 tablet (200 mg total) by mouth  daily. For 1 month then STOP medication. Last dose May 10 2017 30 tablet 1  . furosemide (LASIX) 40 MG tablet Take 1 tablet (40 mg total) by mouth daily. -- Office visit needed for further refills 90 tablet 0  . LORazepam (ATIVAN) 0.5 MG tablet Take 1 tablet (0.5 mg total) by mouth at bedtime. 30 tablet 1  . methocarbamol (ROBAXIN) 500 MG tablet Take 2 tablets (1,000 mg total) by mouth every 8 (eight) hours as needed for muscle spasms. 20 tablet 0  . oxyCODONE (OXY IR/ROXICODONE) 5 MG immediate release tablet Take 1-2 tablets (5-10 mg total) by mouth every 6 (six) hours as needed for moderate pain. 30 tablet 0  . predniSONE (DELTASONE) 1 MG tablet TAKE 2 TABLETS (2 MG TOTAL) BY MOUTH DAILY WITH BREAKFAST. TAKE WITH A 5 MG TABLET FOR A 7 MG DOSE 180 tablet 1  . traMADol (ULTRAM) 50 MG tablet Take 1 tablet (50 mg total) by mouth every 12 (twelve) hours as needed (mild pain). 10 tablet 0  . traZODone (DESYREL) 50 MG tablet Take 1-2 tablets by mouth daily at bedtime as needed for sleep -- Office visit needed for further refills 30 tablet 0   No facility-administered medications prior to visit.      Allergies:   Azithromycin; Ibuprofen; Levaquin [levofloxacin in  d5w]; Tetracycline; Isovue [iopamidol]; Sulfonamide derivatives; Aleve [naproxen]; Tape; Zosyn [piperacillin sod-tazobactam so]; Amlodipine besy-benazepril hcl; and Amoxicillin   Social History   Socioeconomic History  . Marital status: Married    Spouse name: Not on file  . Number of children: Not on file  . Years of education: Not on file  . Highest education level: Not on file  Occupational History  . 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 Financial resource strain: Not on file  . Food insecurity:    Worry: Not on file    Inability: Not on file  . Transportation needs:    Medical: Not on file    Non-medical: Not on file  Tobacco Use  . Smoking status: Never Smoker  . Smokeless tobacco: Never Used  Substance and Sexual Activity  . Alcohol use: No    Alcohol/week: 0.0 oz  . Drug use: No  . Sexual activity: Not Currently  Lifestyle  . Physical activity:    Days per week: Not on file    Minutes per session: Not on file  . Stress: Not on file  Relationships  . Social connections:    Talks on phone: Not on file    Gets together: Not on file    Attends religious service: Not on file    Active member of club or organization: Not on file    Attends meetings of clubs or organizations: Not on file    Relationship status: Not on file  Other Topics Concern  . Not on file  Social History Narrative   Lives with husband in a one story home.  Had one child that only lived for 14 hours.     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          Epworth Sleepiness Scale = 2 (as of 03/14/2015)     Family History:  The patient's family history includes Cancer in her father; Heart attack (age of onset: 61) in her mother; Heart failure in her mother; Hypertension in her sister; Hyperthyroidism in her sister and sister; Lung cancer in her father; Stroke  in her paternal grandmother.   ROS:   Please see the history of  present illness.    ROS All other systems reviewed and are negative.   PHYSICAL EXAM:   VS:  BP 118/70   Pulse 70   Ht 5\' 2"  (1.575 m)   Wt 268 lb (121.6 kg)   BMI 49.02 kg/m    GEN: Well nourished, well developed, in no acute distress  HEENT: normal  Neck: no JVD, carotid bruits, or masses Cardiac: RRR; no murmurs, rubs, or gallops,no edema  Respiratory:  clear to auscultation bilaterally, normal work of breathing GI: soft, nontender, nondistended, + BS MS: no deformity or atrophy  Skin: warm and dry, no rash Neuro:  Alert and Oriented x 3, Strength and sensation are intact Psych: euthymic mood, full affect  Wt Readings from Last 3 Encounters:  07/26/17 268 lb (121.6 kg)  04/08/17 256 lb 12.8 oz (116.5 kg)  03/26/17 271 lb 2.7 oz (123 kg)      Studies/Labs Reviewed:   EKG:  EKG is ordered today.  The ekg ordered today demonstrates NSR without poor R wave progression in anterior leads  Recent Labs: 10/12/2016: ALT 26 03/24/2017: TSH 3.152 04/08/2017: Hemoglobin 9.8; Platelets 481 04/22/2017: BUN 17; Creatinine, Ser 1.04; Potassium 3.8; Sodium 135   Lipid Panel    Component Value Date/Time   CHOL 256 (H) 08/09/2014 0928   TRIG 180 (H) 08/09/2014 0928   HDL 74 08/09/2014 0928   LDLCALC 146 (H) 08/09/2014 0928    Additional studies/ records that were reviewed today include:   30 day event monitor 05/25/2017 Quality: Fair.  Baseline artifact. Predominant rhythm: sinus rhythm. Average heart rate: 68 bpm  No arrhythmias noted.   ASSESSMENT:    1. PAF (paroxysmal atrial fibrillation) ([place])   2. Essential hypertension   3. Hyperlipidemia, unspecified hyperlipidemia type   4. Myasthenia gravis ([place])      PLAN:  In order of problems listed above:  1. Paroxysmal atrial fibrillation: Occurred in the setting of surgery, recent 30-day event monitor does not show any recurrence of atrial fibrillation despite coming off of amiodarone.  Patient has been taken off of  Eliquis at this point.  No AV nodal blocking agent unless any recurrence.  2. Hypertension: Blood pressure stable  3. Hyperlipidemia: She is not on any statin.  Consider fasting lipid panel on follow-up  4. Coronary artery calcification: Seen on previous CT, however cardiac catheterization in 2017 showed clean coronary arteries.  5. Myasthenia gravis: Chronic weakness.    Medication Adjustments/Labs and Tests Ordered: Current medicines are reviewed at length with the patient today.  Concerns regarding medicines are outlined above.  Medication changes, Labs and Tests ordered today are listed in the Patient Instructions below. Patient Instructions  Medication Instructions:  Your physician recommends that you continue on your current medications as directed. Please refer to the Current Medication list given to you today.  Labwork: None   Testing/Procedures: None   Follow-Up: Your physician recommends that you schedule a follow-up appointment in: 4-6 months with Dr Oval Linsey.  Any Other Special Instructions Will Be Listed Below (If Applicable). If you need a refill on your cardiac medications before your next appointment, please call your pharmacy.     Hilbert Corrigan, [place]  07/28/2017 6:17 PM    [facility] [address], [place], [place]  [address] Phone: [phone]; Fax: [phone]

## 2017-07-28 ENCOUNTER — Encounter: Payer: Self-pay | Admitting: Physician Assistant

## 2017-07-29 ENCOUNTER — Ambulatory Visit: Payer: Medicare Other | Admitting: Physician Assistant

## 2017-07-29 NOTE — Progress Notes (Deleted)
Office Visit Note  Patient: Kimberly [name]             Date of Birth: 20-Aug-1945           MRN: [id number]             PCP: [name], MD Referring: [name], MD Visit Date: 08/05/2017 Occupation: @GUAROCC @  Subjective:  No chief complaint on file.   History of Present Illness: Kimberly [name] is a 72 y.o. female ***   Activities of Daily Living:  Patient reports morning stiffness for *** {minute/hour:19697}.   Patient {ACTIONS;DENIES/REPORTS:21021675::"Denies"} nocturnal pain.  Difficulty dressing/grooming: {ACTIONS;DENIES/REPORTS:21021675::"Denies"} Difficulty climbing stairs: {ACTIONS;DENIES/REPORTS:21021675::"Denies"} Difficulty getting out of chair: {ACTIONS;DENIES/REPORTS:21021675::"Denies"} Difficulty using hands for taps, buttons, cutlery, and/or writing: {ACTIONS;DENIES/REPORTS:21021675::"Denies"}  No Rheumatology ROS completed.   PMFS History:  Patient Active Problem List   Diagnosis Date Noted  . Diverticular stricture ([place]) 03/18/2017  . Skin erythema 11/01/2016  . Sleep difficulties 10/31/2016  . Pelvic abscess in female 10/11/2016  . Bilateral leg edema 08/20/2016  . Recurrent UTI 08/15/2016  . Psoriasis 04/13/2016  . History of diverticulitis 04/13/2016  . Osteopenia of multiple sites 04/13/2016  . DJD (degenerative joint disease), cervical 04/07/2016  . Primary osteoarthritis of both knees 04/07/2016  . Decreased abduction of right shoulder joint 04/07/2016  . Atrial tachycardia ([place]) 05/12/2015  . NSVT (nonsustained ventricular tachycardia) ([place]) 05/12/2015  . Abnormal nuclear stress test - INTERMEDIATE RISK 04/14/2015  . Coronary artery calcification seen on CAT scan 02/28/2015  . Left lower quadrant pain 02/17/2015  . Hyperglycemia 08/09/2014  . Dyspnea 03/20/2013  . Hypokalemia 12/31/2012  . Umbilical hernia 80/99/8338  . Pleural effusion 05/10/2012  . Myasthenia gravis without exacerbation ([place]) 02/12/2010  . DIPLOPIA  11/21/2009  . Vitamin D deficiency 06/25/2008  . Hyperlipidemia 06/25/2008  . Essential hypertension 06/25/2008  . Leukocytoclastic vasculitis ([place]) 06/19/2007  . MUSCLE WEAKNESS (GENERALIZED) 02/10/2007  . Unspecified Myalgia and Myositis 01/24/2007  . EDEMA- LOCALIZED 01/24/2007  . ANA POSITIVE, HX OF 01/24/2007    Past Medical History:  Diagnosis Date  . Allergy   . Aortic atherosclerosis ([place])   . Atrial tachycardia ([place]) 05/12/2015  . Cataract   . Colovesical fistula   . Diverticulitis   . Diverticulosis   . DJD (degenerative joint disease), cervical   . Frequent UTI   . GERD (gastroesophageal reflux disease)   . History of hiatal hernia    Small hernia  . Hypertension   . Internal hemorrhoids 01/21/2017   noted on colonopscopy  . Leukocytoclastic vasculitis ([place])   . Myalgia and myositis, unspecified   . Myasthenia gravis without exacerbation (HCC)    [facility], Dr Anna Genre  . NSVT (nonsustained ventricular tachycardia) ([place]) 05/12/2015  . Osteoarthritis    Both knees  . Osteopenia   . Other and unspecified hyperlipidemia   . Periumbilical hernia   . Pneumonia 04/2016  . PONV (postoperative nausea and vomiting)   . Psoriasis    plantar ;Dr Jarome Matin  . Sleep difficulties   . Vasculitis ([place])    Dr Estanislado Pandy  . Vitamin D deficiency     Family History  Problem Relation Age of Onset  . Heart attack Mother 66  . Heart failure Mother   . Stroke Paternal Grandmother        in 29s  . Lung cancer Father   . Cancer Father        lung  . Hypertension Sister   . Hyperthyroidism Sister  Graves  . Hyperthyroidism Sister        Graves  . Diabetes Neg Hx   . Colon cancer Neg Hx   . Esophageal cancer Neg Hx   . Stomach cancer Neg Hx   . Rectal cancer Neg Hx    Past Surgical History:  Procedure Laterality Date  . APPENDECTOMY     & exploratory for inflammation; Dr Hassell Done  . CARDIAC CATHETERIZATION N/A 04/17/2015   Procedure: Left Heart Cath and Coronary  Angiography;  Surgeon: Leonie Man, MD;  Location: [facility];  Service: Cardiovascular;  Laterality: N/A;  . CERVICAL FUSION     C5-6; Dr Vertell Limber  . CHOLECYSTECTOMY     for stones  . COLONOSCOPY W/ POLYPECTOMY  01/21/2017  . CYSTOSCOPY WITH STENT PLACEMENT Bilateral 03/18/2017   Procedure: CYSTOSCOPY WITH RETROGRADE AND BILATERAL URETERAL STENT PLACEMENT;  Surgeon: Lucas Mallow, MD;  Location: [facility];  Service: Urology;  Laterality: Bilateral;  . IR RADIOLOGIST EVAL & MGMT  11/16/2016  . IR RADIOLOGIST EVAL & MGMT  12/08/2016  . IR RADIOLOGIST EVAL & MGMT  12/16/2016  . LAMINECTOMY  2010   T1-2; Dr Vertell Limber  . PARTIAL COLECTOMY N/A 03/18/2017   Procedure: OPEN SIGMOIDECTOMY;  Surgeon: Leighton Ruff, MD;  Location: [facility];  Service: General;  Laterality: N/A;  . SHOULDER SURGERY     Left & Right  . THORACENTESIS     for peripneumonic effusion  . TOTAL ABDOMINAL HYSTERECTOMY W/ BILATERAL SALPINGOOPHORECTOMY     For Fibroids & endometriosis  . UMBILICAL HERNIA REPAIR N/A 03/18/2017   Procedure: OPEN HERNIA REPAIR UMBILICAL ADULT;  Surgeon: Leighton Ruff, MD;  Location: [facility];  Service: General;  Laterality: N/A;  ERAs pathway   Social History   Social History Narrative   Lives with husband in a one story home.  Had one child that only lived for 14 hours.     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          Epworth Sleepiness Scale = 2 (as of 03/14/2015)    Objective: Vital Signs: There were no vitals taken for this visit.   Physical Exam   Musculoskeletal Exam: ***  CDAI Exam: No CDAI exam completed.   Investigation: No additional findings.  Imaging: No results found.  Recent Labs: Lab Results  Component Value Date   WBC 9.6 04/08/2017   HGB 9.8 (L) 04/08/2017   PLT 481 (H) 04/08/2017   NA 135 04/22/2017   K 3.8 04/22/2017   CL 98 04/22/2017   CO2 26 04/22/2017   GLUCOSE 89 04/22/2017   BUN 17 04/22/2017    CREATININE 1.04 (H) 04/22/2017   BILITOT 0.7 10/12/2016   ALKPHOS 54 10/12/2016   AST 30 10/12/2016   ALT 26 10/12/2016   PROT 6.5 10/12/2016   ALBUMIN 3.1 (L) 10/12/2016   CALCIUM 8.9 04/22/2017   GFRAA 63 04/22/2017    Speciality Comments: No specialty comments available.  Procedures:  No procedures performed Allergies: Azithromycin; Ibuprofen; Levaquin [levofloxacin in d5w]; Tetracycline; Isovue [iopamidol]; Sulfonamide derivatives; Aleve [naproxen]; Tape; Zosyn [piperacillin sod-tazobactam so]; Amlodipine besy-benazepril hcl; and Amoxicillin   Assessment / Plan:     Visit Diagnoses: Leukocytoclastic vasculitis ([place]) - Biopsy proven.  High risk medication use - prednisone 5mg  po qd(off methotrexate since August due to colovesicular fistula)  DDD (degenerative disc disease), cervical  Primary osteoarthritis of both knees  H/O repair of rotator cuff  Pedal  edema  Psoriasis  Osteopenia of multiple sites  Myasthenia gravis without exacerbation (HCC)  History of diverticulitis  History of vitamin D deficiency  History of hypertension  History of hyperlipidemia   Orders: No orders of the defined types were placed in this encounter.  No orders of the defined types were placed in this encounter.   Face-to-face time spent with patient was *** minutes. Greater than 50% of time was spent in counseling and coordination of care.  Follow-Up Instructions: No follow-ups on file.   Ofilia Neas, PA-C  Note - This record has been created using Dragon software.  Chart creation errors have been sought, but may not always  have been located. Such creation errors do not reflect on  the standard of medical care.

## 2017-08-05 ENCOUNTER — Ambulatory Visit: Payer: Medicare Other | Admitting: Physician Assistant

## 2017-08-05 ENCOUNTER — Other Ambulatory Visit: Payer: Self-pay | Admitting: Rheumatology

## 2017-08-05 NOTE — Telephone Encounter (Signed)
Last visit: 02/09/17 Next Visit: 08/26/17  Okay to refill per Dr. Estanislado Pandy

## 2017-08-12 NOTE — Progress Notes (Signed)
Office Visit Note  Patient: Kimberly [name]             Date of Birth: [birth year]           MRN: [id number]             PCP: [name], MD Referring: [name], MD Visit Date: 08/26/2017 Occupation: @GUAROCC @  Subjective:  Pain in multiple joints   History of Present Illness: Kimberly [name] is a 72 y.o. female with history of leukocytoclastic vasculitis, DDD, and osteoarthritis. Patient is on Prednisone 5 mg po daily.  She states that she has been off of methotrexate since September 2018 due to having a colovesicular fistula.  She states that she has been having increased joint pain in multiple joints including bilateral shoulders bilateral hands bilateral hips, bilateral knee joints.  She states that she has a right rotator cuff and left shoulder adhesive capsulitis which cause chronic pain.  She denies any joint swelling at this time.  She reports that she has generalized myalgias as well.  She states that she cannot take NSAIDs.  She states that in the past she has had cortisone injections as well as Visco supplementation for bilateral knee joints.  She states that she is interested in stem cell injections of both knee joints.  She denies any recent rashes.  She states that while she was on IV antibiotics following her colovesicular fistula surgery she had a rash due to being on an antibiotic.  She continues to follow-up with her dermatologist Dr. Ronnald Ramp on a regular basis.  She states that she continues to have skin dryness on bilateral lower extremities which is improving with the topical cream that she has been applying.   Activities of Daily Living:  Patient reports morning stiffness for 0 none.   Patient Denies nocturnal pain.  Difficulty dressing/grooming: Reports Difficulty climbing stairs: Reports Difficulty getting out of chair: Reports Difficulty using hands for taps, buttons, cutlery, and/or writing: Reports  Review of Systems  Constitutional: Negative for  fatigue.  HENT: Negative for mouth sores, mouth dryness and nose dryness.   Eyes: Negative for pain, visual disturbance and dryness.  Respiratory: Negative for cough, hemoptysis, shortness of breath and difficulty breathing.   Cardiovascular: Negative for chest pain, palpitations, hypertension and swelling in legs/feet.  Gastrointestinal: Positive for diarrhea. Negative for blood in stool and constipation.  Endocrine: Negative for increased urination.  Genitourinary: Negative for difficulty urinating and painful urination.  Musculoskeletal: Positive for arthralgias, joint pain and muscle tenderness. Negative for joint swelling, myalgias, muscle weakness, morning stiffness and myalgias.  Skin: Negative for color change, pallor, rash, hair loss, nodules/bumps, skin tightness, ulcers and sensitivity to sunlight.  Allergic/Immunologic: Negative for susceptible to infections.  Neurological: Negative for dizziness, numbness, headaches and weakness.  Hematological: Negative for bruising/bleeding tendency and swollen glands.  Psychiatric/Behavioral: Positive for sleep disturbance. Negative for depressed mood. The patient is not nervous/anxious.     PMFS History:  Patient Active Problem List   Diagnosis Date Noted  . Diverticular stricture ([place]) 03/18/2017  . Skin erythema 11/01/2016  . Sleep difficulties 10/31/2016  . Pelvic abscess in female 10/11/2016  . Bilateral leg edema 08/20/2016  . Recurrent UTI 08/15/2016  . Psoriasis 04/13/2016  . History of diverticulitis 04/13/2016  . Osteopenia of multiple sites 04/13/2016  . DJD (degenerative joint disease), cervical 04/07/2016  . Primary osteoarthritis of both knees 04/07/2016  . Decreased abduction of right shoulder joint 04/07/2016  . Atrial tachycardia ([place])  05/12/2015  . NSVT (nonsustained ventricular tachycardia) ([place]) 05/12/2015  . Abnormal nuclear stress test - INTERMEDIATE RISK 04/14/2015  . Coronary artery calcification seen on CAT  scan 02/28/2015  . Left lower quadrant pain 02/17/2015  . Hyperglycemia 08/09/2014  . Dyspnea 03/20/2013  . Hypokalemia 12/31/2012  . Umbilical hernia 27/06/2374  . Pleural effusion 05/10/2012  . Myasthenia gravis without exacerbation ([place]) 02/12/2010  . DIPLOPIA 11/21/2009  . Vitamin D deficiency 06/25/2008  . Hyperlipidemia 06/25/2008  . Essential hypertension 06/25/2008  . Leukocytoclastic vasculitis ([place]) 06/19/2007  . MUSCLE WEAKNESS (GENERALIZED) 02/10/2007  . Unspecified Myalgia and Myositis 01/24/2007  . EDEMA- LOCALIZED 01/24/2007  . ANA POSITIVE, HX OF 01/24/2007    Past Medical History:  Diagnosis Date  . Allergy   . Aortic atherosclerosis ([place])   . Atrial tachycardia ([place]) 05/12/2015  . Cataract   . Colovesical fistula   . Diverticulitis   . Diverticulosis   . DJD (degenerative joint disease), cervical   . Frequent UTI   . GERD (gastroesophageal reflux disease)   . History of hiatal hernia    Small hernia  . Hypertension   . Internal hemorrhoids 01/21/2017   noted on colonopscopy  . Leukocytoclastic vasculitis ([place])   . Myalgia and myositis, unspecified   . Myasthenia gravis without exacerbation (HCC)    [facility], Dr Anna Genre  . NSVT (nonsustained ventricular tachycardia) ([place]) 05/12/2015  . Osteoarthritis    Both knees  . Osteopenia   . Other and unspecified hyperlipidemia   . Periumbilical hernia   . Pneumonia 04/2016  . PONV (postoperative nausea and vomiting)   . Psoriasis    plantar ;Dr Jarome Matin  . Sleep difficulties   . Vasculitis ([place])    Dr Estanislado Pandy  . Vitamin D deficiency     Family History  Problem Relation Age of Onset  . Heart attack Mother 26  . Heart failure Mother   . Stroke Paternal Grandmother        in 36s  . Lung cancer Father   . Cancer Father        lung  . Hyperthyroidism Sister        Gra[place]  . Hypertension Sister   . Diabetes Neg Hx   . Colon cancer Neg Hx   . Esophageal cancer Neg Hx   . Stomach cancer Neg Hx   .  Rectal cancer Neg Hx    Past Surgical History:  Procedure Laterality Date  . APPENDECTOMY     & exploratory for inflammation; Dr Hassell Done  . CARDIAC CATHETERIZATION N/A 04/17/2015   Procedure: Left Heart Cath and Coronary Angiography;  Surgeon: Leonie Man, MD;  Location: [facility];  Service: Cardiovascular;  Laterality: N/A;  . CERVICAL FUSION     C5-6; Dr Vertell Limber  . CHOLECYSTECTOMY     for stones  . COLONOSCOPY W/ POLYPECTOMY  01/21/2017  . CYSTOSCOPY WITH STENT PLACEMENT Bilateral 03/18/2017   Procedure: CYSTOSCOPY WITH RETROGRADE AND BILATERAL URETERAL STENT PLACEMENT;  Surgeon: Lucas Mallow, MD;  [facility];  Service: Urology;  Laterality: Bilateral;  . HERNIA REPAIR    . IR RADIOLOGIST EVAL & MGMT  11/16/2016  . IR RADIOLOGIST EVAL & MGMT  12/08/2016  . IR RADIOLOGIST EVAL & MGMT  12/16/2016  . LAMINECTOMY  2010   T1-2; Dr Vertell Limber  . PARTIAL COLECTOMY N/A 03/18/2017   Procedure: OPEN SIGMOIDECTOMY;  Surgeon: Leighton Ruff, MD;  [facility];  Service: General;  Laterality: N/A;  .  SHOULDER SURGERY     Left & Right  . THORACENTESIS     for peripneumonic effusion  . TOTAL ABDOMINAL HYSTERECTOMY W/ BILATERAL SALPINGOOPHORECTOMY     For Fibroids & endometriosis  . TOTAL SHOULDER ARTHROPLASTY    . UMBILICAL HERNIA REPAIR N/A 03/18/2017   Procedure: OPEN HERNIA REPAIR UMBILICAL ADULT;  Surgeon: Leighton Ruff, MD;  [facility];  Service: General;  Laterality: N/A;  ERAs pathway   Social History   Social History Narrative   Lives with husband in a one story home.  Had one child that only lived for 14 hours.     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          Epworth Sleepiness Scale = 2 (as of 03/14/2015)    Objective: Vital Signs: BP (!) 143/56 (BP Location: Left Arm, Patient Position: Sitting, Cuff Size: Normal)   Pulse 73   Resp 18   Ht 5\' 2"  (1.575 m)   Wt 277 lb (125.6 kg)   BMI 50.66 kg/m    Physical Exam    Constitutional: She is oriented to person, place, and time. She appears well-developed and well-nourished.  HENT:  Head: Normocephalic and atraumatic.  Eyes: Conjunctivae and EOM are normal.  Neck: Normal range of motion.  Cardiovascular: Normal rate, regular rhythm and intact distal pulses.  Murmur heard. Pitting edema of bilateral lower extremities  Pulmonary/Chest: Effort normal and breath sounds normal.  Abdominal: Soft. Bowel sounds are normal.  Lymphadenopathy:    She has no cervical adenopathy.  Neurological: She is alert and oriented to person, place, and time.  Skin: Skin is warm and dry. Capillary refill takes 2 to 3 seconds.  Psychiatric: She has a normal mood and affect. Her behavior is normal.  Nursing note and vitals reviewed.    Musculoskeletal Exam: C-spine good range of motion.  She has limited range of motion bilateral shoulder joints.  Elbow joints, wrist joints, MCPs, PIPs, DIPs good range of motion with no synovitis.  She is complete fist formation bilaterally.  Hip joints were difficult to assess but she is in a chair.  She has very limited mobility.  Knee joints good range of motion with no warmth or effusion.  She has very significant pitting edema in bilateral lower extremities.  CDAI Exam: No CDAI exam completed.   Investigation: No additional findings.  Imaging: No results found.  Recent Labs: Lab Results  Component Value Date   WBC 9.6 04/08/2017   HGB 9.8 (L) 04/08/2017   PLT 481 (H) 04/08/2017   NA 135 04/22/2017   K 3.8 04/22/2017   CL 98 04/22/2017   CO2 26 04/22/2017   GLUCOSE 89 04/22/2017   BUN 17 04/22/2017   CREATININE 1.04 (H) 04/22/2017   BILITOT 0.7 10/12/2016   ALKPHOS 54 10/12/2016   AST 30 10/12/2016   ALT 26 10/12/2016   PROT 6.5 10/12/2016   ALBUMIN 3.1 (L) 10/12/2016   CALCIUM 8.9 04/22/2017   GFRAA 63 04/22/2017    Speciality Comments: No specialty comments available.  Procedures:  No procedures  performed Allergies: Azithromycin; Ibuprofen; Levaquin [levofloxacin in d5w]; Tetracycline; Isovue [iopamidol]; Sulfonamide derivatives; Aleve [naproxen]; Tape; Zosyn [piperacillin sod-tazobactam so]; Amlodipine besy-benazepril hcl; and Amoxicillin   Assessment / Plan:     Visit Diagnoses: Leukocytoclastic vasculitis ([place]) - Bx proven, lower extremity involvement: She has not have had recent flares.  She had an allergic reaction to Zosyn that caused a severe lower  extremity rash with skin peeling. She continues to have skin dryness and erythema.  She is applying Diprolene AF 0.05% ointment, which has been improving the skin dryness. She was advised to follow up with Dr. Ronnald Ramp.  We will not restart her on MTX due to the risk of infection.  She is on Prednisone 5 mg po daily.   High risk medication use - Prednsione 5 mg qd. She has been off MTX since September 2018 due to having a colovesicular fistula.  She had surgery for correction in March 2019.  DDD (degenerative disc disease), cervical: She has good ROM with no discomfort at this time.   Primary osteoarthritis of both knees: No warmth or effusion of knee joints.  She has good ROM.  She had an inadequate response to cortisone injections and visco gel injections in the past.  She is interested in stem cell therapy.  We explained that the evidence is in early stages.    H/O repair of rotator cuff: Right, She has limited ROM with discomfort.  She has chronic pain and limited ROM of bilateral shoulder joints.   Psoriasis -Managed by Dr. Jarome Matin.  Osteopenia of multiple sites - Followed by PCP. She takes calcium and vitamin D on a regular basis.   Other medical conditions are listed as follows:   Myasthenia gravis without exacerbation ([place])  History of diverticulitis  History of vitamin D deficiency  History of hyperlipidemia  History of hypertension   Orders: No orders of the defined types were placed in this encounter.  No orders  of the defined types were placed in this encounter.   Face-to-face time spent with patient was 30 minutes. Greater than 50% of time was spent in counseling and coordination of care.  Follow-Up Instructions: Return for Leukocytoclastic vasculitis, DDD, osteoarthritis .   Ofilia Neas, PA-C  Note - This record has been created using Dragon software.  Chart creation errors have been sought, but may not always  have been located. Such creation errors do not reflect on  the standard of medical care.

## 2017-08-20 ENCOUNTER — Other Ambulatory Visit: Payer: Self-pay | Admitting: Internal Medicine

## 2017-08-21 ENCOUNTER — Other Ambulatory Visit: Payer: Self-pay | Admitting: Rheumatology

## 2017-08-22 NOTE — Telephone Encounter (Signed)
Last visit: 02/09/2017 Next visit: 08/26/2017  Okay to refill per Dr. Estanislado Pandy.

## 2017-08-26 ENCOUNTER — Ambulatory Visit (INDEPENDENT_AMBULATORY_CARE_PROVIDER_SITE_OTHER): Payer: Medicare Other | Admitting: Physician Assistant

## 2017-08-26 ENCOUNTER — Encounter: Payer: Self-pay | Admitting: Physician Assistant

## 2017-08-26 VITALS — BP 143/56 | HR 73 | Resp 18 | Ht 62.0 in | Wt 277.0 lb

## 2017-08-26 DIAGNOSIS — Z9889 Other specified postprocedural states: Secondary | ICD-10-CM

## 2017-08-26 DIAGNOSIS — I2510 Atherosclerotic heart disease of native coronary artery without angina pectoris: Secondary | ICD-10-CM | POA: Diagnosis not present

## 2017-08-26 DIAGNOSIS — M170 Bilateral primary osteoarthritis of knee: Secondary | ICD-10-CM | POA: Diagnosis not present

## 2017-08-26 DIAGNOSIS — I2584 Coronary atherosclerosis due to calcified coronary lesion: Secondary | ICD-10-CM

## 2017-08-26 DIAGNOSIS — Z8679 Personal history of other diseases of the circulatory system: Secondary | ICD-10-CM | POA: Diagnosis not present

## 2017-08-26 DIAGNOSIS — Z8719 Personal history of other diseases of the digestive system: Secondary | ICD-10-CM | POA: Diagnosis not present

## 2017-08-26 DIAGNOSIS — M8589 Other specified disorders of bone density and structure, multiple sites: Secondary | ICD-10-CM

## 2017-08-26 DIAGNOSIS — Z79899 Other long term (current) drug therapy: Secondary | ICD-10-CM

## 2017-08-26 DIAGNOSIS — M310 Hypersensitivity angiitis: Secondary | ICD-10-CM

## 2017-08-26 DIAGNOSIS — Z8639 Personal history of other endocrine, nutritional and metabolic disease: Secondary | ICD-10-CM | POA: Diagnosis not present

## 2017-08-26 DIAGNOSIS — M5030 Other cervical disc degeneration, unspecified cervical region: Secondary | ICD-10-CM | POA: Diagnosis not present

## 2017-08-26 DIAGNOSIS — L409 Psoriasis, unspecified: Secondary | ICD-10-CM

## 2017-08-26 DIAGNOSIS — G7000 Myasthenia gravis without (acute) exacerbation: Secondary | ICD-10-CM | POA: Diagnosis not present

## 2017-09-09 ENCOUNTER — Other Ambulatory Visit: Payer: Self-pay | Admitting: Internal Medicine

## 2017-09-09 DIAGNOSIS — I10 Essential (primary) hypertension: Secondary | ICD-10-CM

## 2017-09-30 ENCOUNTER — Other Ambulatory Visit: Payer: Self-pay | Admitting: Internal Medicine

## 2017-09-30 DIAGNOSIS — I10 Essential (primary) hypertension: Secondary | ICD-10-CM

## 2017-11-16 ENCOUNTER — Other Ambulatory Visit: Payer: Self-pay | Admitting: Rheumatology

## 2017-11-16 NOTE — Telephone Encounter (Signed)
Last Visit: 08/26/17 Next Visit: 11/24/17  Okay to refill per Dr. Deveshwar 

## 2017-11-19 ENCOUNTER — Other Ambulatory Visit: Payer: Self-pay | Admitting: Cardiovascular Disease

## 2017-11-24 ENCOUNTER — Ambulatory Visit (INDEPENDENT_AMBULATORY_CARE_PROVIDER_SITE_OTHER): Payer: Medicare Other | Admitting: Cardiovascular Disease

## 2017-11-24 ENCOUNTER — Encounter: Payer: Self-pay | Admitting: Cardiovascular Disease

## 2017-11-24 VITALS — BP 124/74 | HR 74 | Resp 16 | Ht 62.0 in | Wt 263.0 lb

## 2017-11-24 DIAGNOSIS — Z23 Encounter for immunization: Secondary | ICD-10-CM | POA: Diagnosis not present

## 2017-11-24 DIAGNOSIS — I480 Paroxysmal atrial fibrillation: Secondary | ICD-10-CM

## 2017-11-24 DIAGNOSIS — I2510 Atherosclerotic heart disease of native coronary artery without angina pectoris: Secondary | ICD-10-CM | POA: Diagnosis not present

## 2017-11-24 DIAGNOSIS — I10 Essential (primary) hypertension: Secondary | ICD-10-CM

## 2017-11-24 DIAGNOSIS — E785 Hyperlipidemia, unspecified: Secondary | ICD-10-CM | POA: Diagnosis not present

## 2017-11-24 DIAGNOSIS — I2584 Coronary atherosclerosis due to calcified coronary lesion: Secondary | ICD-10-CM

## 2017-11-24 NOTE — Progress Notes (Signed)
Cardiology Office Note   Date:  11/24/2017   ID:  Kimberly [name], DOB [birth year], MRN [id number]  PCP:  Kimberly Rail, MD  Cardiologist:   Kimberly Latch, MD   Chief Complaint  Patient presents with  . Atrial Fibrillation      Patient ID: Kimberly [name] is a 72 y.o. female with hypertension, coronary artery calcification, myasthenia gravis, recurrent R pleural effusion, hyperlipidemia who presents for follow up. She was initially seen 03/2015 for evaluation of asymptomatic coronary calcifications.  She had a chest CT to follow up on pleural effusions and was noted to have coronary calcification. She was asymptomatic but did not exercise much due to myasthenia.  She had a [employer] 03/2015 that was concerning for inferior and apical ischemia.  She subsequently underwent left heart catheterization 04/2015 that showed normal, but tortuous coronary arteries.  She reported palpitations and had a 30 day event monitor that showed paroxysmal atrial tachycardia and NSVT. She was not started on any nodal agents due to her history of myasthenia.  She had an echo 05/2015 that revealed LVEF 60-65% with normal diastolic function.  Since her last appointment had one episode of fast heart rates.  It always occurs at night.  This episode lasted for less than one hour.  There was no lightheadedness, dizziness or shortness of breath.  She was also hospitalized for pneumonia in April and she is finally recovering her strength.  She continues to struggle with recurrent UTIs.  She has bee on 9 different antibiotics.  She is scheduled to follow up with urology next week.  Kimberly [name] also has R LE lymphedema.  She is planning to have knee surgery.  She denies orthopnea or PND.  Since her last appointment Kimberly [name] was admitted 03/2017 for repair of her colovesicular fistula.  On postoperative day 6 she developed atrial fibrillation with rapid ventricular response.  She received 1 dose of IV  metoprolol but was switched to amiodarone given her myasthenia.  She converted back to sinus rhythm on amiodarone and was started on Eliquis.  She followed up with Kimberly Deforest, PA, 03/2017 and reported dizziness and nausea that was thought to be due to the amiodarone.  Given that her atrial for relation was thought to be postoperative amiodarone was decreased with plans to discontinue it.  She did not have any recurrent atrial fibrillation after that time.  She again wore a 30-day event monitor that was without atrial fibrillation.  Since that time she has been doing very well.  Lasix was reduced due to acute renal failure.  She has not noted any increased lower extremity edema.  She continues to have some diarrhea but it is not as urgent as before her surgery.  She has noted that carbohydrate seem to be the only food that does not exacerbate her diarrhea.  This is making it hard for her to lose weight.  She is unable to eat solids or other vegetables despite the point that she actually likes them.  She is trying to slowly add back other foods to her diet.   Past Medical History:  Diagnosis Date  . Allergy   . Aortic atherosclerosis ([place])   . Atrial tachycardia ([place]) 05/12/2015  . Cataract   . Colovesical fistula   . Diverticulitis   . Diverticulosis   . DJD (degenerative joint disease), cervical   . Frequent UTI   . GERD (gastroesophageal reflux disease)   . History of hiatal hernia  Small hernia  . Hypertension   . Internal hemorrhoids 01/21/2017   noted on colonopscopy  . Leukocytoclastic vasculitis ([place])   . Myalgia and myositis, unspecified   . Myasthenia gravis without exacerbation (HCC)    [facility], Kimberly [name]  . NSVT (nonsustained ventricular tachycardia) ([place]) 05/12/2015  . Osteoarthritis    Both knees  . Osteopenia   . Other and unspecified hyperlipidemia   . Periumbilical hernia   . Pneumonia 04/2016  . PONV (postoperative nausea and vomiting)   . Psoriasis    plantar ;Kimberly [name]  . Sleep difficulties   . Vasculitis ([place])    Kimberly [name]  . Vitamin D deficiency     Past Surgical History:  Procedure Laterality Date  . APPENDECTOMY     & exploratory for inflammation; Kimberly [name]  . CARDIAC CATHETERIZATION N/A 04/17/2015   Procedure: Left Heart Cath and Coronary Angiography;  Surgeon: Kimberly Man, MD;  Location: [facility];  Service: Cardiovascular;  Laterality: N/A;  . CERVICAL FUSION     C5-6; Kimberly [name]  . CHOLECYSTECTOMY     for stones  . COLONOSCOPY W/ POLYPECTOMY  01/21/2017  . CYSTOSCOPY WITH STENT PLACEMENT Bilateral 03/18/2017   Procedure: CYSTOSCOPY WITH RETROGRADE AND BILATERAL URETERAL STENT PLACEMENT;  Surgeon: Kimberly Mallow, MD;  Location: [facility];  Service: Urology;  Laterality: Bilateral;  . HERNIA REPAIR    . IR RADIOLOGIST EVAL & MGMT  11/16/2016  . IR RADIOLOGIST EVAL & MGMT  12/08/2016  . IR RADIOLOGIST EVAL & MGMT  12/16/2016  . LAMINECTOMY  2010   T1-2; Kimberly [name]  . PARTIAL COLECTOMY N/A 03/18/2017   Procedure: OPEN SIGMOIDECTOMY;  Surgeon: Kimberly Ruff, MD;  Location: [facility];  Service: General;  Laterality: N/A;  . SHOULDER SURGERY     Left & Right  . THORACENTESIS     for peripneumonic effusion  . TOTAL ABDOMINAL HYSTERECTOMY W/ BILATERAL SALPINGOOPHORECTOMY     For Fibroids & endometriosis  . TOTAL SHOULDER ARTHROPLASTY    . UMBILICAL HERNIA REPAIR N/A 03/18/2017   Procedure: OPEN HERNIA REPAIR UMBILICAL ADULT;  Surgeon: Kimberly Ruff, MD;  Location: [facility];  Service: General;  Laterality: N/A;  ERAs pathway     Current Outpatient Medications  Medication Sig Dispense Refill  . acetaminophen (TYLENOL) 325 MG tablet Take 2 tablets (650 mg total) by mouth every 6 (six) hours as needed for mild pain (or temp > 100).    Marland Kitchen augmented betamethasone dipropionate (DIPROLENE-AF) 0.05 % ointment Apply 1 application topically as needed. For vasculitis and psoriasis     . CALCIUM PO Take 1,000 mg by mouth at bedtime.    .  cholecalciferol (VITAMIN D) 1000 units tablet Take 1,000 Units by mouth at bedtime.    . folic acid (FOLVITE) 1 MG tablet TAKE 2 TABLETS EVERY DAY 180 tablet 4  . furosemide (LASIX) 40 MG tablet Take 0.5 tablets (20 mg total) by mouth daily. 90 tablet 1  . losartan (COZAAR) 100 MG tablet Take 1 tablet (100 mg total) by mouth daily. -- Office visit needed for further refills 90 tablet 0  . Multiple Vitamin (MULTIVITAMIN WITH MINERALS) TABS tablet Take 1 tablet by mouth at bedtime.    . predniSONE (DELTASONE) 5 MG tablet TAKE 1 TABLET BY MOUTH EVERY DAY WITH BREAKFAST 90 tablet 0  . pyridostigmine (MESTINON) 60 MG tablet Take 1 tablet (60 mg total) by mouth 3 (three) times daily. 270 tablet 3  . pyridOXINE (  VITAMIN B-6) 100 MG tablet Take 100 mg by mouth at bedtime.    Marland Kitchen spironolactone (ALDACTONE) 25 MG tablet TAKE 1 TABLET BY MOUTH EVERY DAY 90 tablet 1  . vitamin B-12 (CYANOCOBALAMIN) 1000 MCG tablet Take 1,000 mcg by mouth at bedtime.     No current facility-administered medications for this visit.     Allergies:   Azithromycin; Ibuprofen; Levaquin [levofloxacin in d5w]; Tetracycline; Isovue [iopamidol]; Sulfonamide derivatives; Aleve [naproxen]; Tape; Zosyn [piperacillin sod-tazobactam so]; Amlodipine besy-benazepril hcl; and Amoxicillin    Social History:  The patient  reports that she has never smoked. She has never used smokeless tobacco. She reports that she does not drink alcohol or use drugs.   Family History:  The patient's family history includes Cancer in her father; Heart attack (age of onset: 57) in her mother; Heart failure in her mother; Hypertension in her sister; Hyperthyroidism in her sister; Lung cancer in her father; Stroke in her paternal grandmother.   ROS:  Please see the history of present illness.   Otherwise, review of systems are positive for none.   All other systems are reviewed and negative.   PHYSICAL EXAM: VS:  BP 124/74   Pulse 74   Resp 16   Ht 5\' 2"   (1.575 m)   Wt 263 lb (119.3 kg)   SpO2 97%   BMI 48.10 kg/m  , BMI Body mass index is 48.1 kg/m. GENERAL:  Well appearing HEENT: Pupils equal round and reactive, fundi not visualized, oral mucosa unremarkable NECK:  No jugular venous distention, waveform within normal limits, carotid upstroke brisk and symmetric, no bruits LUNGS:  Clear to auscultation bilaterally HEART:  RRR.  PMI not displaced or sustained,S1 and S2 within normal limits, no S3, no S4, no clicks, no rubs, I/VI systolic murmurs ABD:  Flat, positive bowel sounds normal in frequency in pitch, no bruits, no rebound, no guarding, no midline pulsatile mass, no hepatomegaly, no splenomegaly EXT:  2 plus pulses throughout, no edema, no cyanosis no clubbing SKIN:  No rashes no nodules NEURO:  Cranial nerves II through XII grossly intact, motor grossly intact throughout PSYCH:  Cognitively intact, oriented to person place and time   EKG:  EKG is not ordered today.  Echo 05/26/15: Study Conclusions  - Left ventricle: The cavity size was mildly dilated. Systolic   function was normal. The estimated ejection fraction was in the   range of 60% to 65%. Wall motion was normal; there were no   regional wall motion abnormalities. Left ventricular diastolic   function parameters were normal. - Aortic valve: Transvalvular velocity was within the normal range.   There was no stenosis. There was no regurgitation. - Mitral valve: There was no regurgitation. - Left atrium: The atrium was mildly dilated. - Right ventricle: The cavity size was normal. Wall thickness was   normal. Systolic function was normal. - Tricuspid valve: There was no regurgitation. - Inferior vena cava: The vessel was normal in size. The   respirophasic diameter changes were in the normal range (= 50%),   consistent with normal central venous pressure.   28 Day Event Monitor 04/01/15:  Quality: Fair.  Baseline artifact. Predominant rhythm: sinus  rhythm Average heart rate: 70 bpm Max heart rate: 126 bpm Min heart rate: 50 bpm  Short runs of atrial tachycardia lasting up to 11 seconds.  One 6 beat run of NSVT.   Lexiscan Cardiolite 03/28/15: 1. Nuclear stress EF: 71%. 2. The left ventricular ejection fraction is hyperdynamic (>  65%). 3. There was no ST segment deviation noted during stress. 4. This is an intermediate risk study. 5. Findings consistent with ischemia.  Technically difficult study due to increased subdiaphragmatic activity; intermediate risk with small, severe, reversible defects in the apical and inferior basal walls consistent with ischemia; EF 71 with normal wall motion.  LHC 04/17/15: 6. The left ventricular systolic function is normal. 7. Angiographically normal coronary arteries, but very tortuous   Angiographically no evidence of any significant lesions to explain the patient's abnormal stress test. There does appear to be mild calcification in the LAD distribution, but certainly not on the luminal aspect.  Recent Labs: 03/24/2017: TSH 3.152 04/08/2017: Hemoglobin 9.8; Platelets 481 04/22/2017: BUN 17; Creatinine, Ser 1.04; Potassium 3.8; Sodium 135    Lipid Panel    Component Value Date/Time   CHOL 256 (H) 08/09/2014 0928   TRIG 180 (H) 08/09/2014 0928   HDL 74 08/09/2014 0928   LDLCALC 146 (H) 08/09/2014 0928      Wt Readings from Last 3 Encounters:  11/24/17 263 lb (119.3 kg)  08/26/17 277 lb (125.6 kg)  07/26/17 268 lb (121.6 kg)      ASSESSMENT AND PLAN:  # Coronary calcification: # Abnormal stress: No significant CAD on cath. She denies chest pain. Continue aspirin. Avoid statins or nodal agents due to her history of myasthenia.  She will have lipids checked with her PCP soon.  # Hypertension: BP well-controlled on losartan, spironolactone, and furosemide.  # Paroxysmal atrial tachycardia, NSVT: 30 day event monitor showed runs of atrial tachycardia as well as one run of  nonsustained ventricular tachycardia. Since her last appointment she had one episode.  No nodal agents 2/2 Myasthenia.  No ischmia or underlying structural heart disease.  # PAF: Noted postoperatively.  She is currently in sinus rhythm.  No recurrent atrial fibrillation noted on her 30-day event monitor.  She is not on a beta-blocker due to her history of myasthenia gravis.    Current medicines are reviewed at length with the patient today.  The patient does not have concerns regarding medicines.  The following changes have been made:  None  Labs/ tests ordered today include:   No orders of the defined types were placed in this encounter.   Disposition:   FU with Kassity Woodson C. Oval Linsey, MD, [facility] in 6 months.   Signed, Demarkus Remmel C. Oval Linsey, MD, [facility]  11/24/2017 3:25 PM    [facility]

## 2017-11-24 NOTE — Patient Instructions (Signed)
Medication Instructions:  Your physician recommends that you continue on your current medications as directed. Please refer to the Current Medication list given to you today.  If you need a refill on your cardiac medications before your next appointment, please call your pharmacy.   Lab work: NONE  Testing/Procedures: NONE   Follow-Up: At [facility], you and your health needs are our priority.  As part of our continuing mission to provide you with exceptional heart care, we have created designated Provider Care Teams.  These Care Teams include your primary Cardiologist (physician) and Advanced Practice Providers (APPs -  Physician Assistants and Nurse Practitioners) who all work together to provide you with the care you need, when you need it. You will need a follow up appointment in 6 months.  Please call our office 2 months in advance to schedule this appointment.  You may see Tiffany Rising Star, MD or one of the following Advanced Practice Providers on your designated Care Team:   Luke Kilroy, PA-C Krista Kroeger, PA-C . Callie Goodrich, PA-C   

## 2017-11-29 ENCOUNTER — Encounter: Payer: Self-pay | Admitting: Cardiovascular Disease

## 2017-11-30 ENCOUNTER — Other Ambulatory Visit: Payer: Self-pay

## 2017-12-09 ENCOUNTER — Other Ambulatory Visit: Payer: Self-pay | Admitting: Internal Medicine

## 2017-12-09 MED ORDER — LOSARTAN POTASSIUM 100 MG PO TABS: 100.0000 mg | ORAL_TABLET | Freq: Every day | ORAL | 0 refills | Status: DC

## 2017-12-16 ENCOUNTER — Telehealth: Payer: Self-pay | Admitting: Internal Medicine

## 2017-12-16 NOTE — Telephone Encounter (Signed)
Spoke with Mrs. Struve she stated that she will give office a call when she is ready to schedule her wellness visit. SF

## 2018-01-22 ENCOUNTER — Other Ambulatory Visit: Payer: Self-pay | Admitting: Neurology

## 2018-02-06 ENCOUNTER — Telehealth: Payer: Self-pay | Admitting: Neurology

## 2018-02-06 ENCOUNTER — Other Ambulatory Visit: Payer: Self-pay | Admitting: *Deleted

## 2018-02-06 MED ORDER — PYRIDOSTIGMINE BROMIDE 60 MG PO TABS: 60.0000 mg | ORAL_TABLET | Freq: Three times a day (TID) | ORAL | 0 refills | Status: DC

## 2018-02-06 NOTE — Telephone Encounter (Signed)
Rx sent in for a 30 day supply.

## 2018-02-06 NOTE — Telephone Encounter (Signed)
Patient has appt on Friday 02-10-18 to see Dr Posey Pronto and needs [facility] py[facility]ostigm[facility] [address]

## 2018-02-09 NOTE — Progress Notes (Signed)
Follow-up Visit   Date: 02/10/18    Kimberly [name] MRN: [id number] DOB: 08/22/1945   Interim History: Kimberly [name] is a 73 y.o. right-handed Caucasian female with with osteoarthritis, leg edema, leukocytoclastic vasculitis (2010) being followed by Dr. Estanislado Pandy, thoracic T1-2 laminectomy (2011), and cervical fusion at C5-6 (2008), and paroxysmal atrial fibrillation returning to the clinic for follow-up of seronegative myasthenia gravis.   History of present illness: In 2011, patient started developing diplopia, blurred vision, ptosis, and gait abnormality for which she initially saw Dr. 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 She underwent MRI/A/V of the brain which showed T2 abnormality in the right cerebellar lesion so was referred her to Dr. Anna Genre at [facility]  who took over her care.  Per review of Dr. Alberteen Sam notes, the lesion was thought to be nonspecific and likely incidental as it would not explain all her symptoms.  She had two unsuccessful attempts with lumbar puncture and because of low suspicion for MS, it was not pursued again.  In the meantime, she also underwent repetitive nerve stimulation with single fiber EMG which showed evidence of neuromuscular junction disorder.   AChR antibodies were negative. At the time of her myasthenia gravis diagnosis, patient was already taking prednisone (possibly 30mg ?) for vasculitis, so was started on mestinon 60mg  three times daily which helped her muscle strength and resolved her diplopia.  She remained on prednisone but during the winters of 2013 and 2014, she was was struggling with pneumonia with pleural effusion requiring thoracentesis.  Because of this, she was started on methotrexate in January 2015 and reports not having pneumonia this past winter.  She has never been hospitalized for myasthenia, required intubation, or had any crisis.   UPDATE 02/09/2018: She was last seen in the office in July 2018 and is here  for refills on mestinon.  Her myasthenia gravis has been under good control. She had mild right ptosis, no other ocular, bulbar, or limb weakness. No double vision.  She had had a number of other medical issues since being here.  She has been followed by rheumatology for leukocytoclastic vasculitis he has been off methotrexate since September 2018 due to developing colovesicular fistula s/p surgery March 2019.  Hospitalization was complicated by paroxysmal atrial fibrillation.  She is maintained on prednisone 5 mg daily and has not had any exacerbation in myasthenia or vasculitis.  She is having ongoing problems with diarrhea.  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    Medications:  Current Outpatient Medications on File Prior to Visit  Medication Sig Dispense Refill  . acetaminophen (TYLENOL) 325 MG tablet Take 2 tablets (650 mg total) by mouth every 6 (six) hours as needed for mild pain (or temp > 100).    Marland Kitchen augmented betamethasone dipropionate (DIPROLENE-AF) 0.05 % ointment Apply 1 application topically as needed. For vasculitis and psoriasis     . CALCIUM PO Take 1,000 mg by mouth at bedtime.    . cholecalciferol (VITAMIN D) 1000 units tablet Take 1,000 Units by mouth at bedtime.    . folic acid (FOLVITE) 1 MG tablet TAKE 2 TABLETS EVERY DAY 180 tablet 4  . furosemide (LASIX) 40 MG tablet Take 0.5 tablets (20 mg total) by mouth daily. 90 tablet 1  . losartan (COZAAR) 100 MG tablet Take 1 tablet (100 mg total) by mouth daily. 90 tablet 0  . Multiple Vitamin (MULTIVITAMIN WITH MINERALS) TABS tablet Take 1 tablet  by mouth at bedtime.    . predniSONE (DELTASONE) 5 MG tablet TAKE 1 TABLET BY MOUTH EVERY DAY WITH BREAKFAST 90 tablet 0  . pyridOXINE (VITAMIN B-6) 100 MG tablet Take 100 mg by mouth at bedtime.    Marland Kitchen spironolactone (ALDACTONE) 25 MG tablet TAKE 1 TABLET BY MOUTH EVERY DAY 90 tablet 1  . vitamin B-12 (CYANOCOBALAMIN) 1000 MCG tablet Take 1,000 mcg  by mouth at bedtime.     No current facility-administered medications on file prior to visit.     Allergies:  Allergies  Allergen Reactions  . Azithromycin Other (See Comments)    Angioedema  . Ibuprofen     800 mg - edema all over, including lips and tongue  . Levaquin [Levofloxacin In D5w] Other (See Comments)    D/T MYASTHENIA GRAVIS  . Tetracycline Rash  . Isovue [Iopamidol] Dermatitis    Pt had a rash and blisters on bilat feet and lower part of legs.  Full premeds in the future.  This happened twice after IV contrast.   J Bohm  RTRCT  . Sulfonamide Derivatives     Seizure age 32 (Note: probably fever induced)  . Aleve [Naproxen] Other (See Comments)    Caused excessive salivation, throat tightness  . Tape Other (See Comments)    Causes blisters  Paper tape only  . Zosyn [Piperacillin Sod-Tazobactam So]     Severe leg rash, skin peeling  . Amlodipine Besy-Benazepril Hcl Cough  . Amoxicillin Swelling and Rash    Has patient had a PCN reaction causing immediate rash, facial/tongue/throat swelling, SOB or lightheadedness with hypotension: No Has patient had a PCN reaction causing severe rash involving mucus membranes or skin necrosis: No Has patient had a PCN reaction that required hospitalization: No Has patient had a PCN reaction occurring within the last 10 years: Yes If all of the above answers are "NO", then may proceed with Cephalosporin use.     Review of Systems:  CONSTITUTIONAL: No fevers, chills, night sweats, or weight loss.  EYES: No visual changes or eye pain ENT: No hearing changes.  No history of nose bleeds.   RESPIRATORY: No cough, wheezing and shortness of breath.   CARDIOVASCULAR: Negative for chest pain, and palpitations.   GI: Negative for abdominal discomfort, blood in stools or black stools.  No recent change in bowel habits.   GU:  No history of incontinence.   MUSCLOSKELETAL: +history of joint pain or swelling.  No myalgias.   SKIN: Negative for  lesions, rash, and itching.   ENDOCRINE: Negative for cold or heat intolerance, polydipsia or goiter.   PSYCH:  + depression or anxiety symptoms.   NEURO: As Above.   Vital Signs:  BP 110/84   Pulse 94   Ht 5\' 2"  (1.575 m)   Wt 286 lb 2 oz (129.8 kg)   SpO2 98%   BMI 52.33 kg/m   General Medical Exam:   General:  Well appearing, comfortable, obese Eyes/ENT: see cranial nerve examination.   Neck:   No carotid bruits. Respiratory:  Clear to auscultation, good air entry bilaterally.   Cardiac:  Regular rate and rhythm, no murmur.   Ext: Mild edema in the legs  Neurological Exam: MENTAL STATUS including orientation to time, place, person, recent and remote memory, attention span and concentration, language, and fund of knowledge is normal.  Speech is not dysarthric.  CRANIAL NERVES:   Pupils equal round and reactive to light.  Normal conjugate, extra-ocular eye movements in all  directions of gaze.  Mild right ptosis without worsening with sustained upward gaze. Normal facial muscles.  Face is symmetric. Palate elevates symmetrically.  Tongue is midline.  MOTOR:  Motor strength is 5/5 in all extremities. No pronator drift.  Tone is normal.    REFLEXES:  2+/4 in the upper extremities 1+/4 in the lower extremities  COORDINATION/GAIT:   Gait wide-based due to body habitus and knee pain, mildly unsteady.She is unable to stand from chair without using arms.   Data: Labs 08/08/2014:  HbA1c 5.3 Labs 04/09/2014:  TSH 3.217, B12 1422 Labs 01/30/2010:  AChR binding, blocking, and modulating antibody - negative, ANA negative, MPO antibody negative Labs 11/29/2009:  AChR binding, blocking, and modulating antibody - negative, ANA negative, dsDNA neg, RPR neg  MRI brain wo contrast 11/28/2009: 1.  Large area of T1 signal abnormality of the right cerebellar hemisphere, involving the white matter.  This unusual appearance is most suggestive of a demyelinating process such as multiple sclerosis,  ADEM, or PML.  Given the patient's immunosuppression, PML should be excluded.  Vasculitis is of high likelihood given the patient known history.    2.  Chronic parenchymal brain volume loss  MRA/V wo contrast 11/28/2009: 1.  Normal MRV of the brain 2.  No definite abnormality is noted on MRA of the brain.  There is unusual right posterior circulation branching pattern with an apparent fetal origin right PCA and an accessory posterior cerebral artery with an attached patent posterior communicating artery.  No significant stenosis.   MRI brain wwo contrast 11/29/2009: 1.  Stable appearance of asymmetric increased T2 signal within the right cerebellum with an underlying mass lesion or abnormal enhancement identified.  This finding is indeterminate, but may present underlying demyelinating disease.   RNS with SFEMG performed at [facility] 01/09/2010:  Abnormal study.  These electrodiagnostic findings are consistent with a disorder of neuromuscular transmission as can be seen in myasthenia gravis.   IMPRESSION: 1.  Seronegative myasthenia gravis (diagnosed 2011 by RNS and SFEMG at [facility]) without exacerbation.  Myasthenia gravis is well controlled without any relapses.  Exam shows mild right ptosis, otherwise no fatigable weakness.   She takes prednisone 5mg  daily for leukocytoclastic vasculitis which has also controlled her myasthenia.   Continue on prednisone 5mg  and mestinon 60mg  three times daily.    2.  Right cerebellar lesion, less likely MS.  Two prior unsuccessful LPs and work-up at [facility].   Return to clinic in 1 year   Thank you for allowing me to participate in patient's care.  If I can answer any additional questions, I would be pleased to do so.    Sincerely,     K. Posey Pronto, DO

## 2018-02-10 ENCOUNTER — Ambulatory Visit (INDEPENDENT_AMBULATORY_CARE_PROVIDER_SITE_OTHER): Payer: Medicare Other | Admitting: Neurology

## 2018-02-10 ENCOUNTER — Encounter: Payer: Self-pay | Admitting: Neurology

## 2018-02-10 VITALS — BP 110/84 | HR 94 | Ht 62.0 in | Wt 286.1 lb

## 2018-02-10 DIAGNOSIS — G7001 Myasthenia gravis with (acute) exacerbation: Secondary | ICD-10-CM | POA: Diagnosis not present

## 2018-02-10 MED ORDER — PYRIDOSTIGMINE BROMIDE 60 MG PO TABS: 60.0000 mg | ORAL_TABLET | Freq: Three times a day (TID) | ORAL | 3 refills | Status: DC

## 2018-02-10 NOTE — Patient Instructions (Signed)
Continue mestinon 60mg  three times daily  Return to clinic 1 year

## 2018-02-18 ENCOUNTER — Other Ambulatory Visit: Payer: Self-pay | Admitting: Rheumatology

## 2018-02-20 NOTE — Telephone Encounter (Signed)
Last Visit: 08/26/17 Next Visit: 11/24/17  Okay to refill per Dr. Estanislado Pandy

## 2018-02-27 ENCOUNTER — Other Ambulatory Visit: Payer: Self-pay | Admitting: Internal Medicine

## 2018-02-27 DIAGNOSIS — I10 Essential (primary) hypertension: Secondary | ICD-10-CM

## 2018-02-27 MED ORDER — LOSARTAN POTASSIUM 100 MG PO TABS: 100.0000 mg | ORAL_TABLET | Freq: Every day | ORAL | 0 refills | Status: DC

## 2018-02-27 MED ORDER — FUROSEMIDE 40 MG PO TABS: 20.0000 mg | ORAL_TABLET | Freq: Every day | ORAL | 0 refills | Status: DC

## 2018-02-27 NOTE — Telephone Encounter (Signed)
Courtesy refill until appointment 03/10/18.

## 2018-02-27 NOTE — Telephone Encounter (Signed)
Copied from [place] [phone]. Topic: Quick Communication - Rx Refill/Question >> Feb 27, 2018  2:07 PM Kimberly [name], NT wrote: Medication: losartan (COZAAR) 100 MG tablet and furosemide (LASIX) 40 MG tablet  Enough to last until her appt on 03/10/2018  Has the patient contacted their pharmacy? Yes.   (Agent: If no, request that the patient contact the pharmacy for the refill.) (Agent: If yes, when and what did the pharmacy advise?)  Preferred Pharmacy (with phone number or street name): [id number] - [place], [place]. AT [address] [phone] (Phone) [phone] (Fax)    Agent: Please be advised that RX refills may take up to 3 business days. We ask that you follow-up with your pharmacy.

## 2018-03-09 NOTE — Progress Notes (Signed)
Subjective:    Patient ID: Kimberly [name], female    DOB: February 20, 1945, 73 y.o.   MRN: [id number]  HPI The patient is here for an acute visit.  In March 2019 she had a sigmoid ectomy for a colovesicular fistula and abscess.  She had been on multiple antibiotics for recurrent urinary tract infections prior to the fistula being discovered.  She had a history of diverticulitis in 2017.  On 03/18/2017 she had an open sigmoidectomy, umbilical hernial repair and cystoscopy with retrograde and bilateral ureteral stent placement.  Uncontrollable bowels: it started in late sept or October.  She was having 5-6 episodes of diarrhea at least a day.  She never had abdominal cramping or blood.  She called the surgeon and she was placed on a liquid diet for a few days and then followed by a bland diet including mostly  - eggs, chicken, [place], cheese, pasta, rice and other carbs.  This diet helps the stools - they are soft and not diarrhea.   Some days she may go 3-4 times, and some days only twice.  She does have a normal stool on occasion.  At times she will have difficulty controlling her bowels.  She does have some concern going out to appointments because of this.  She denies any abdominal cramping.  She will have occasional left lower abdominal pain that is transient.  She denies any blood in the stool.  He has not had any fevers or chills.    Medications and allergies reviewed with patient and updated if appropriate.  Patient Active Problem List   Diagnosis Date Noted  . Diverticular stricture ([place]) 03/18/2017  . Skin erythema 11/01/2016  . Sleep difficulties 10/31/2016  . Pelvic abscess in female 10/11/2016  . Bilateral leg edema 08/20/2016  . Recurrent UTI 08/15/2016  . Psoriasis 04/13/2016  . History of diverticulitis 04/13/2016  . Osteopenia of multiple sites 04/13/2016  . DJD (degenerative joint disease), cervical 04/07/2016  . Primary osteoarthritis of both knees 04/07/2016  . Decreased  abduction of right shoulder joint 04/07/2016  . Atrial tachycardia ([place]) 05/12/2015  . NSVT (nonsustained ventricular tachycardia) ([place]) 05/12/2015  . Abnormal nuclear stress test - INTERMEDIATE RISK 04/14/2015  . Coronary artery calcification seen on CAT scan 02/28/2015  . Left lower quadrant pain 02/17/2015  . Hyperglycemia 08/09/2014  . Dyspnea 03/20/2013  . Hypokalemia 12/31/2012  . Umbilical hernia 29/52/8413  . Pleural effusion 05/10/2012  . Myasthenia gravis without exacerbation ([place]) 02/12/2010  . DIPLOPIA 11/21/2009  . Vitamin D deficiency 06/25/2008  . Hyperlipidemia 06/25/2008  . Essential hypertension 06/25/2008  . Leukocytoclastic vasculitis ([place]) 06/19/2007  . MUSCLE WEAKNESS (GENERALIZED) 02/10/2007  . Unspecified Myalgia and Myositis 01/24/2007  . EDEMA- LOCALIZED 01/24/2007  . ANA POSITIVE, HX OF 01/24/2007    Current Outpatient Medications on File Prior to Visit  Medication Sig Dispense Refill  . acetaminophen (TYLENOL) 325 MG tablet Take 2 tablets (650 mg total) by mouth every 6 (six) hours as needed for mild pain (or temp > 100).    Marland Kitchen augmented betamethasone dipropionate (DIPROLENE-AF) 0.05 % ointment Apply 1 application topically as needed. For vasculitis and psoriasis     . CALCIUM PO Take 1,000 mg by mouth at bedtime.    . cholecalciferol (VITAMIN D) 1000 units tablet Take 1,000 Units by mouth at bedtime.    . folic acid (FOLVITE) 1 MG tablet TAKE 2 TABLETS EVERY DAY 180 tablet 4  . furosemide (LASIX) 40 MG tablet Take 0.5  tablets (20 mg total) by mouth daily. KEEP APPOINTMENT 03/10/18 FOR ADDITIONAL REFILLS. 6 tablet 0  . losartan (COZAAR) 100 MG tablet TAKE 1 TABLET BY MOUTH EVERY DAY 90 tablet 0  . Multiple Vitamin (MULTIVITAMIN WITH MINERALS) TABS tablet Take 1 tablet by mouth at bedtime.    . predniSONE (DELTASONE) 5 MG tablet TAKE 1 TABLET BY MOUTH EVERY DAY WITH BREAKFAST 90 tablet 0  . pyridostigmine (MESTINON) 60 MG tablet Take 1 tablet (60 mg total)  by mouth 3 (three) times daily. 270 tablet 3  . pyridOXINE (VITAMIN B-6) 100 MG tablet Take 100 mg by mouth at bedtime.    Marland Kitchen spironolactone (ALDACTONE) 25 MG tablet TAKE 1 TABLET BY MOUTH EVERY DAY 90 tablet 1  . vitamin B-12 (CYANOCOBALAMIN) 1000 MCG tablet Take 1,000 mcg by mouth at bedtime.     No current facility-administered medications on file prior to visit.     Past Medical History:  Diagnosis Date  . Allergy   . Aortic atherosclerosis ([place])   . Atrial tachycardia ([place]) 05/12/2015  . Cataract   . Colovesical fistula   . Diverticulitis   . Diverticulosis   . DJD (degenerative joint disease), cervical   . Frequent UTI   . GERD (gastroesophageal reflux disease)   . History of hiatal hernia    Small hernia  . Hypertension   . Internal hemorrhoids 01/21/2017   noted on colonopscopy  . Leukocytoclastic vasculitis ([place])   . Myalgia and myositis, unspecified   . Myasthenia gravis without exacerbation ([facility])    [facility], Dr Anna Genre  . NSVT (nonsustained ventricular tachycardia) ([place]) 05/12/2015  . Osteoarthritis    Both knees  . Osteopenia   . Other and unspecified hyperlipidemia   . Periumbilical hernia   . Pneumonia 04/2016  . PONV (postoperative nausea and vomiting)   . Psoriasis    plantar ;Dr Jarome Matin  . Sleep difficulties   . Vasculitis ([place])    Dr Estanislado Pandy  . Vitamin D deficiency     Past Surgical History:  Procedure Laterality Date  . APPENDECTOMY     & exploratory for inflammation; Dr Hassell Done  . CARDIAC CATHETERIZATION N/A 04/17/2015   Procedure: Left Heart Cath and Coronary Angiography;  Surgeon: Leonie Man, MD;  Location: [facility];  Service: Cardiovascular;  Laterality: N/A;  . CERVICAL FUSION     C5-6; Dr Vertell Limber  . CHOLECYSTECTOMY     for stones  . COLONOSCOPY W/ POLYPECTOMY  01/21/2017  . CYSTOSCOPY WITH STENT PLACEMENT Bilateral 03/18/2017   Procedure: CYSTOSCOPY WITH RETROGRADE AND BILATERAL URETERAL STENT PLACEMENT;  Surgeon: Lucas Mallow, MD;  Location: [facility];  Service: Urology;  Laterality: Bilateral;  . HERNIA REPAIR    . IR RADIOLOGIST EVAL & MGMT  11/16/2016  . IR RADIOLOGIST EVAL & MGMT  12/08/2016  . IR RADIOLOGIST EVAL & MGMT  12/16/2016  . LAMINECTOMY  2010   T1-2; Dr Vertell Limber  . PARTIAL COLECTOMY N/A 03/18/2017   Procedure: OPEN SIGMOIDECTOMY;  Surgeon: Leighton Ruff, MD;  Location: [facility];  Service: General;  Laterality: N/A;  . SHOULDER SURGERY     Left & Right  . THORACENTESIS     for peripneumonic effusion  . TOTAL ABDOMINAL HYSTERECTOMY W/ BILATERAL SALPINGOOPHORECTOMY     For Fibroids & endometriosis  . TOTAL SHOULDER ARTHROPLASTY    . UMBILICAL HERNIA REPAIR N/A 03/18/2017   Procedure: OPEN HERNIA REPAIR UMBILICAL ADULT;  Surgeon: Leighton Ruff, MD;  Location: [facility];  Service: General;  Laterality: N/A;  ERAs pathway    Social History   Socioeconomic History  . Marital status: Married    Spouse name: Not on file  . Number of children: Not on file  . Years of education: Not on file  . Highest education level: Not on file  Occupational History  . 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 Financial resource strain: Not on file  . Food insecurity:    Worry: Not on file    Inability: Not on file  . Transportation needs:    Medical: Not on file    Non-medical: Not on file  Tobacco Use  . Smoking status: Never Smoker  . Smokeless tobacco: Never Used  Substance and Sexual Activity  . Alcohol use: No    Alcohol/week: 0.0 standard drinks  . Drug use: No  . Sexual activity: Not Currently  Lifestyle  . Physical activity:    Days per week: Not on file    Minutes per session: Not on file  . Stress: Not on file  Relationships  . Social connections:    Talks on phone: Not on file    Gets together: Not on file    Attends religious service: Not on file    Active member of club or organization: Not on file    Attends meetings of clubs or organizations: Not on file      Relationship status: Not on file  Other Topics Concern  . Not on file  Social History Narrative   Lives with husband in a one story home.  Had one child that only lived for 14 hours.     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          Epworth Sleepiness Scale = 2 (as of 03/14/2015)    Family History  Problem Relation Age of Onset  . Heart attack Mother 29  . Heart failure Mother   . Stroke Paternal Grandmother        in 58s  . Lung cancer Father   . Cancer Father        lung  . Hyperthyroidism Sister        Graves  . Hypertension Sister   . Diabetes Neg Hx   . Colon cancer Neg Hx   . Esophageal cancer Neg Hx   . Stomach cancer Neg Hx   . Rectal cancer Neg Hx     Review of Systems  Constitutional: Negative for chills and fever.  Respiratory: Negative for shortness of breath.   Cardiovascular: Negative for chest pain and palpitations.  Gastrointestinal: Positive for abdominal pain (occ LLQ) and diarrhea. Negative for blood in stool, nausea and vomiting.       No gerd  Neurological: Positive for headaches (occ, sinus). Negative for dizziness and light-headedness.       Objective:   Vitals:   03/10/18 0741  BP: 138/86  Pulse: 71  Resp: 18  Temp: 98.4 F (36.9 C)  SpO2: 97%   BP Readings from Last 3 Encounters:  03/10/18 138/86  02/10/18 110/84  11/24/17 124/74   Wt Readings from Last 3 Encounters:  03/10/18 293 lb (132.9 kg)  02/10/18 286 lb 2 oz (129.8 kg)  11/24/17 263 lb (119.3 kg)   Body mass index is 53.59 kg/m.   Physical Exam    Constitutional: Appears well-developed and well-nourished. No distress.  HENT:  Head: Normocephalic and atraumatic.  Neck: Neck supple. No tracheal deviation present. No thyromegaly present.  No cervical lymphadenopathy Cardiovascular: Normal rate, regular rhythm and normal heart sounds.   No murmur heard.  No edema Pulmonary/Chest: Effort normal and breath sounds normal. No  respiratory distress. No has no wheezes. No rales.  Abdomen: soft, NT, ND Skin: Skin is warm and dry. Not diaphoretic.  Psychiatric: Normal mood and affect. Behavior is normal.        Assessment & Plan:    See Problem List for Assessment and Plan of chronic medical problems.

## 2018-03-10 ENCOUNTER — Encounter: Payer: Self-pay | Admitting: Internal Medicine

## 2018-03-10 ENCOUNTER — Ambulatory Visit (INDEPENDENT_AMBULATORY_CARE_PROVIDER_SITE_OTHER): Payer: Medicare Other | Admitting: Internal Medicine

## 2018-03-10 DIAGNOSIS — R197 Diarrhea, unspecified: Secondary | ICD-10-CM | POA: Insufficient documentation

## 2018-03-10 NOTE — Assessment & Plan Note (Signed)
She started having diarrhea the fall 2019.  She did have sigmoidectomy 03/2017 for colorectal fistula and has a history of diverticulitis/recurrent urinary tract infections She was on several antibiotics prior to the surgery due to recurrent UTIs and an abscess near the fistula Diarrhea improved with changing her diet to mostly carbohydrate, cheese, chicken, [place] diet Still having soft stools 3-4 times a day Overall does not feel well and has gained weight Sometimes does not have control of her bowels Has not seen GI for this-advised to make an appointment with Dr. Havery Moros Start Metamucil 1-2 times a day Discussed that there are several other things that may help and that we will be able to get her her off of her current diet

## 2018-03-10 NOTE — Patient Instructions (Addendum)
Start metamucil 1-2 times a day.    Make an appointment with Dr Havery Moros

## 2018-03-16 ENCOUNTER — Telehealth: Payer: Self-pay | Admitting: Cardiovascular Disease

## 2018-03-16 NOTE — Telephone Encounter (Signed)
New message     Patient c/o Palpitations:  High priority if patient c/o lightheadedness, shortness of breath, or chest pain  1) How long have you had palpitations/irregular HR/ Afib? Are you having the symptoms now? Has had afib symptoms since last night, no symptoms now   2) Are you currently experiencing lightheadedness, SOB or CP? No   3) Do you have a history of afib (atrial fibrillation) or irregular heart rhythm? Yes   4) Have you checked your BP or HR? (document readings if available): 135/74 bp  72 hr  5) Are you experiencing any other symptoms? No

## 2018-03-16 NOTE — Telephone Encounter (Signed)
Pt called to report that she has had several days of diarrhea which she saw her PMD.. Dr. Quay Burow on 03/10/18... but did not have any labwork... she had an episode of feeling like she may have been in Afib for about 40 minutes.. her HR was up and down and peaked at 164.. she did not feel bad but had an "uneasy feeling in her chest.. she denies dizziness, sob, but was very anxious about it... she says this was her first episode since 11/2017.. she says she feels well today... I advised her to relax today and make sure she is drinking enough fluids and eating well.... I will forward to Dr. Oval Linsey for her recommendations.

## 2018-03-16 NOTE — Telephone Encounter (Addendum)
Spoke with patient and she is still feeling ok. Scheduled her visit in Afib clinic next week. Date, time, and location given to patient. She will call back if any problems before then.

## 2018-03-22 ENCOUNTER — Other Ambulatory Visit: Payer: Self-pay

## 2018-03-22 ENCOUNTER — Encounter (HOSPITAL_COMMUNITY): Payer: Self-pay | Admitting: Physician Assistant

## 2018-03-22 ENCOUNTER — Ambulatory Visit (HOSPITAL_COMMUNITY)
Admission: RE | Admit: 2018-03-22 | Discharge: 2018-03-22 | Disposition: A | Discharge status: Home / Self Care | Payer: Medicare Other | Source: Ambulatory Visit | Attending: Physician Assistant | Admitting: Physician Assistant

## 2018-03-22 VITALS — BP 118/72 | HR 77 | Ht 62.0 in | Wt 291.0 lb

## 2018-03-22 DIAGNOSIS — I480 Paroxysmal atrial fibrillation: Secondary | ICD-10-CM | POA: Insufficient documentation

## 2018-03-22 DIAGNOSIS — I700 Atherosclerosis of aorta: Secondary | ICD-10-CM | POA: Diagnosis not present

## 2018-03-22 DIAGNOSIS — E785 Hyperlipidemia, unspecified: Secondary | ICD-10-CM | POA: Diagnosis not present

## 2018-03-22 DIAGNOSIS — K219 Gastro-esophageal reflux disease without esophagitis: Secondary | ICD-10-CM | POA: Diagnosis not present

## 2018-03-22 DIAGNOSIS — M8580 Other specified disorders of bone density and structure, unspecified site: Secondary | ICD-10-CM | POA: Diagnosis not present

## 2018-03-22 DIAGNOSIS — Z79899 Other long term (current) drug therapy: Secondary | ICD-10-CM | POA: Diagnosis not present

## 2018-03-22 DIAGNOSIS — Z6841 Body Mass Index (BMI) 40.0 and over, adult: Secondary | ICD-10-CM | POA: Diagnosis not present

## 2018-03-22 DIAGNOSIS — K5792 Diverticulitis of intestine, part unspecified, without perforation or abscess without bleeding: Secondary | ICD-10-CM | POA: Insufficient documentation

## 2018-03-22 DIAGNOSIS — M170 Bilateral primary osteoarthritis of knee: Secondary | ICD-10-CM | POA: Diagnosis not present

## 2018-03-22 DIAGNOSIS — I10 Essential (primary) hypertension: Secondary | ICD-10-CM | POA: Diagnosis not present

## 2018-03-22 DIAGNOSIS — Z7901 Long term (current) use of anticoagulants: Secondary | ICD-10-CM | POA: Insufficient documentation

## 2018-03-22 DIAGNOSIS — E669 Obesity, unspecified: Secondary | ICD-10-CM | POA: Diagnosis not present

## 2018-03-22 LAB — BASIC METABOLIC PANEL
ANION GAP: 9 (ref 5–15)
BUN: 19 mg/dL (ref 8–23)
CO2: 25 mmol/L (ref 22–32)
Calcium: 9.5 mg/dL (ref 8.9–10.3)
Chloride: 103 mmol/L (ref 98–111)
Creatinine, Ser: 0.9 mg/dL (ref 0.44–1.00)
GFR calc Af Amer: >60 mL/min (ref >60)
Glucose, Bld: 95 mg/dL (ref 70–99)
Potassium: 4.1 mmol/L (ref 3.5–5.1)
Sodium: 137 mmol/L (ref 135–145)

## 2018-03-22 LAB — TSH: TSH: 2.838 u[IU]/mL (ref 0.350–4.500)

## 2018-03-22 LAB — CBC
HCT: 39.6 % (ref 36.0–46.0)
Hemoglobin: 12.5 g/dL (ref 12.0–15.0)
MCH: 27.7 pg (ref 26.0–34.0)
MCHC: 31.6 g/dL (ref 30.0–36.0)
MCV: 87.8 fL (ref 80.0–100.0)
NRBC: 0 % (ref 0.0–0.2)
PLATELETS: 299 10*3/uL (ref 150–400)
RBC: 4.51 MIL/uL (ref 3.87–5.11)
RDW: 14.6 % (ref 11.5–15.5)
WBC: 11.5 10*3/uL — AB (ref 4.0–10.5)

## 2018-03-22 MED ORDER — APIXABAN 5 MG PO TABS: 5.0000 mg | ORAL_TABLET | Freq: Two times a day (BID) | ORAL | 6 refills | Status: DC

## 2018-03-22 NOTE — Progress Notes (Signed)
Primary Care Physician: [name], MD Primary Cardiologist: Dr Oval Linsey Referring Physician: Dr Aurelio Jew is a 73 y.o. female with a history of hypertension, coronary artery calcification, myasthenia gravis, recurrent R pleural effusion, hyperlipidemia, and paroxysmal atrial fibrillation who presents for consultation in the [facility].  The patient was initially diagnosed with atrial fibrillation post operatively on 03/2017 with symptoms of fatigue, SOB, and palpitations and was started on amiodarone and Eliquis. She had nausea and dizziness which was thought to be 2/2 the amiodarone and so this was discontinued. Her Eliquis was also stopped as she had no further symptoms. She was in her usual state of health until 03/15/18 when she had palpitations and SOB which were identical to her previous episode. This lasted about 45 minutes.  Today, she denies symptoms of chest pain, orthopnea, PND, lower extremity edema, dizziness, presyncope, syncope, snoring, daytime somnolence, bleeding, or neurologic sequela. The patient is tolerating medications without difficulties and is otherwise without complaint today.    Atrial Fibrillation Risk Factors:  she does not have symptoms or diagnosis of sleep apnea. she does not have a history of rheumatic fever. she does not have a history of alcohol use. The patient does not have a history of early familial atrial fibrillation or other arrhythmias.  she has a BMI of Body mass index is 53.23 kg/m.Marland Kitchen Filed Weights   03/22/18 1424  Weight: 132 kg    Family History  Problem Relation Age of Onset  . Heart attack Mother 56  . Heart failure Mother   . Stroke Paternal Grandmother        in 68s  . Lung cancer Father   . Cancer Father        lung  . Hyperthyroidism Sister        Graves  . Hypertension Sister   . Diabetes Neg Hx   . Colon cancer Neg Hx   . Esophageal cancer Neg Hx   . Stomach cancer Neg  Hx   . Rectal cancer Neg Hx      Atrial Fibrillation Management history:  Previous antiarrhythmic drugs: amiodarone (postop) Previous cardioversions: none Previous ablations: none CHADS2VASC score: 51 (age, female, aortic plaque, HTN) Anticoagulation history: previously Eliquis   Past Medical History:  Diagnosis Date  . Allergy   . Aortic atherosclerosis ([place])   . Atrial tachycardia ([place]) 05/12/2015  . Cataract   . Colovesical fistula   . Diverticulitis   . Diverticulosis   . DJD (degenerative joint disease), cervical   . Frequent UTI   . GERD (gastroesophageal reflux disease)   . History of hiatal hernia    Small hernia  . Hypertension   . Internal hemorrhoids 01/21/2017   noted on colonopscopy  . Leukocytoclastic vasculitis ([place])   . Myalgia and myositis, unspecified   . Myasthenia gravis without exacerbation (HCC)    [facility], Dr Anna Genre  . NSVT (nonsustained ventricular tachycardia) ([place]) 05/12/2015  . Osteoarthritis    Both knees  . Osteopenia   . Other and unspecified hyperlipidemia   . Periumbilical hernia   . Pneumonia 04/2016  . PONV (postoperative nausea and vomiting)   . Psoriasis    plantar ;Dr Jarome Matin  . Sleep difficulties   . Vasculitis ([place])    Dr Estanislado Pandy  . Vitamin D deficiency    Past Surgical History:  Procedure Laterality Date  . APPENDECTOMY     & exploratory for inflammation; Dr Hassell Done  . CARDIAC  CATHETERIZATION N/A 04/17/2015   Procedure: Left Heart Cath and Coronary Angiography;  Surgeon: Leonie Man, MD;  Location: [facility];  Service: Cardiovascular;  Laterality: N/A;  . CERVICAL FUSION     C5-6; Dr Vertell Limber  . CHOLECYSTECTOMY     for stones  . COLONOSCOPY W/ POLYPECTOMY  01/21/2017  . CYSTOSCOPY WITH STENT PLACEMENT Bilateral 03/18/2017   Procedure: CYSTOSCOPY WITH RETROGRADE AND BILATERAL URETERAL STENT PLACEMENT;  Surgeon: Lucas Mallow, MD;  Location: [facility];  Service: Urology;  Laterality: Bilateral;  . HERNIA REPAIR     . IR RADIOLOGIST EVAL & MGMT  11/16/2016  . IR RADIOLOGIST EVAL & MGMT  12/08/2016  . IR RADIOLOGIST EVAL & MGMT  12/16/2016  . LAMINECTOMY  2010   T1-2; Dr Vertell Limber  . PARTIAL COLECTOMY N/A 03/18/2017   Procedure: OPEN SIGMOIDECTOMY;  Surgeon: Leighton Ruff, MD;  [facility]: [facility];  Service: General;  Laterality: N/A;  . SHOULDER SURGERY     Left & Right  . THORACENTESIS     for peripneumonic effusion  . TOTAL ABDOMINAL HYSTERECTOMY W/ BILATERAL SALPINGOOPHORECTOMY     For Fibroids & endometriosis  . TOTAL SHOULDER ARTHROPLASTY    . UMBILICAL HERNIA REPAIR N/A 03/18/2017   Procedure: OPEN HERNIA REPAIR UMBILICAL ADULT;  Surgeon: Leighton Ruff, MD;  [facility]: [facility];  Service: General;  Laterality: N/A;  ERAs pathway    Current Outpatient Medications  Medication Sig Dispense Refill  . acetaminophen (TYLENOL) 325 MG tablet Take 2 tablets (650 mg total) by mouth every 6 (six) hours as needed for mild pain (or temp > 100).    . CALCIUM PO Take 1,000 mg by mouth at bedtime.    . cholecalciferol (VITAMIN D) 1000 units tablet Take 1,000 Units by mouth at bedtime.    . folic acid (FOLVITE) 1 MG tablet TAKE 2 TABLETS EVERY DAY 180 tablet 4  . furosemide (LASIX) 40 MG tablet Take 0.5 tablets (20 mg total) by mouth daily. KEEP APPOINTMENT 03/10/18 FOR ADDITIONAL REFILLS. 6 tablet 0  . losartan (COZAAR) 100 MG tablet TAKE 1 TABLET BY MOUTH EVERY DAY 90 tablet 0  . Melatonin 10 MG CAPS Take by mouth.    . Multiple Vitamin (MULTIVITAMIN WITH MINERALS) TABS tablet Take 1 tablet by mouth at bedtime.    . predniSONE (DELTASONE) 5 MG tablet TAKE 1 TABLET BY MOUTH EVERY DAY WITH BREAKFAST 90 tablet 0  . pyridostigmine (MESTINON) 60 MG tablet Take 1 tablet (60 mg total) by mouth 3 (three) times daily. 270 tablet 3  . pyridOXINE (VITAMIN B-6) 100 MG tablet Take 100 mg by mouth at bedtime.    Marland Kitchen spironolactone (ALDACTONE) 25 MG tablet TAKE 1 TABLET BY MOUTH EVERY DAY 90 tablet 1  . vitamin B-12 (CYANOCOBALAMIN)  1000 MCG tablet Take 1,000 mcg by mouth at bedtime.     No current facility-administered medications for this encounter.     Allergies  Allergen Reactions  . Azithromycin Other (See Comments)    Angioedema  . Ibuprofen     800 mg - edema all over, including lips and tongue  . Levaquin [Levofloxacin In D5w] Other (See Comments)    D/T MYASTHENIA GRAVIS  . Tetracycline Rash  . Isovue [Iopamidol] Dermatitis    Pt had a rash and blisters on bilat feet and lower part of legs.  Full premeds in the future.  This happened twice after IV contrast.   J Bohm  RTRCT  . Sulfonamide Derivatives  Seizure age 65 (Note: probably fever induced)  . Aleve [Naproxen] Other (See Comments)    Caused excessive salivation, throat tightness  . Tape Other (See Comments)    Causes blisters  Paper tape only  . Zosyn [Piperacillin Sod-Tazobactam So]     Severe leg rash, skin peeling  . Amlodipine Besy-Benazepril Hcl Cough  . Amoxicillin Swelling and Rash    Has patient had a PCN reaction causing immediate rash, facial/tongue/throat swelling, SOB or lightheadedness with hypotension: No Has patient had a PCN reaction causing severe rash involving mucus membranes or skin necrosis: No Has patient had a PCN reaction that required hospitalization: No Has patient had a PCN reaction occurring within the last 10 years: Yes If all of the above answers are "NO", then may proceed with Cephalosporin use.     Social History   Socioeconomic History  . Marital status: Married    Spouse name: Not on file  . Number of children: Not on file  . Years of education: Not on file  . Highest education level: Not on file  Occupational History  . 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 Financial resource strain: Not on file  . Food insecurity:    Worry: Not on file    Inability: Not on file  . Transportation needs:    Medical: Not on file    Non-medical: Not on file  Tobacco Use  .  Smoking status: Never Smoker  . Smokeless tobacco: Never Used  Substance and Sexual Activity  . Alcohol use: No    Alcohol/week: 0.0 standard drinks  . Drug use: No  . Sexual activity: Not Currently  Lifestyle  . Physical activity:    Days per week: Not on file    Minutes per session: Not on file  . Stress: Not on file  Relationships  . Social connections:    Talks on phone: Not on file    Gets together: Not on file    Attends religious service: Not on file    Active member of club or organization: Not on file    Attends meetings of clubs or organizations: Not on file    Relationship status: Not on file  . Intimate partner violence:    Fear of current or ex partner: Not on file    Emotionally abused: Not on file    Physically abused: Not on file    Forced sexual activity: Not on file  Other Topics Concern  . Not on file  Social History Narrative   Lives with husband in a one story home.  Had one child that only lived for 14 hours.     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          Epworth Sleepiness Scale = 2 (as of 03/14/2015)     ROS- All systems are reviewed and negative except as per the HPI above.  Physical Exam: Vitals:   03/22/18 1424  BP: 118/72  Pulse: 77  Weight: 132 kg  Height: 5\' 2"  (1.575 m)    GEN- The patient is well appearing obese female, alert and oriented x 3 today.   Head- normocephalic, atraumatic Eyes-  Sclera clear, conjunctiva pink Ears- hearing intact Oropharynx- clear Neck- supple  Lungs- Clear to ausculation bilaterally, normal work of breathing Heart- Regular rate and rhythm, no murmurs, rubs or gallops  GI- soft, NT, ND, + BS Extremities- no clubbing, cyanosis, trace  edema MS- no significant deformity or atrophy Skin- no rash or lesion Psych- euthymic mood, full affect Neuro- strength and sensation are intact  Wt Readings from Last 3 Encounters:  03/22/18 132 kg  03/10/18 132.9 kg   02/10/18 129.8 kg    EKG today demonstrates SR HR 77, NST, PR 130, QRS 72, QTc 445  Echo 03/24/17 demonstrated  - Left ventricle: The cavity size was normal. Wall thickness was   increased in a pattern of mild LVH. Indeterminant diastolic   function (atrial fibrillation). Systolic function was normal. The   estimated ejection fraction was in the range of 55% to 60%.   Although no diagnostic regional wall motion abnormality was   identified, this possibility cannot be completely excluded on the   basis of this study. - Aortic valve: There was no stenosis. - Mitral valve: Mildly calcified annulus. There was no significant   regurgitation. - Right ventricle: The cavity size was normal. Systolic function   was normal. - Pulmonary arteries: No complete TR doppler jet so unable to   estimate PA systolic pressure. - Systemic veins: IVC measured 2.0 cm with < 50% respirophasic   variation, suggesting RA pressure 8 mmHg.  Epic records are reviewed at length today  Assessment and Plan:  1. Paroxysmal atrial fibrillation The patient has paroxysmal atrial fibrillation. Will resume Eliquis 5 mg BID. General precautions discussed. Check Bmet/CBC/TSH Avoiding AV nodal agents given myasthenia.  Would not start AAD given infrequency of symptoms. We discussed Kardia and Apple Watch as possible ways to monitor her heart rhythm to better characterize her arrhythmia. Also offered ILR which she was not interested in at this point.  This patients CHA2DS2-VASc Score and unadjusted Ischemic Stroke Rate (% per year) is equal to 4.8 % stroke rate/year from a score of 4  Above score calculated as 1 point each if present [CHF, HTN, DM, Vascular=MI/PAD/Aortic Plaque, Age if 65-74, or Female] Above score calculated as 2 points each if present [Age > 75, or Stroke/TIA/TE]   2. Obesity Body mass index is 53.23 kg/m. Lifestyle modification was discussed at length including regular exercise and weight  reduction.  3. HTN Stable, no changes today.   Follow up in the Afib Clinic in 1 month.   [facility] [address] [place], [place] [address] [phone] 03/22/2018 2:37 PM

## 2018-03-22 NOTE — Patient Instructions (Signed)
Restart Eliquis 5mg  twice a day

## 2018-03-23 ENCOUNTER — Other Ambulatory Visit: Payer: Self-pay | Admitting: Internal Medicine

## 2018-03-23 DIAGNOSIS — I10 Essential (primary) hypertension: Secondary | ICD-10-CM

## 2018-04-20 ENCOUNTER — Ambulatory Visit (HOSPITAL_COMMUNITY)
Admission: RE | Admit: 2018-04-20 | Discharge: 2018-04-20 | Disposition: A | Discharge status: Home / Self Care | Payer: Medicare Other | Source: Ambulatory Visit | Attending: Physician Assistant | Admitting: Physician Assistant

## 2018-04-20 ENCOUNTER — Other Ambulatory Visit: Payer: Self-pay

## 2018-04-20 DIAGNOSIS — I480 Paroxysmal atrial fibrillation: Secondary | ICD-10-CM

## 2018-04-20 NOTE — Progress Notes (Signed)
Electrophysiology TeleHealth Note   Due to national recommendations of social distancing due to [place] 19, Audio telehealth visit is felt to be most appropriate for this patient at this time.  See consent below from today for patient consent regarding telehealth for the Atrial Fibrillation Clinic. Consent obtained verbally.   Date:  04/20/2018   ID:  Kimberly [name], DOB [birth year], MRN [id number]  Location: home  Provider location: [address] [place], [place] [address] Evaluation Performed: Follow up  PCP:  [name], MD  Primary Cardiologist:  Dr Oval Linsey   CC: Follow up for atrial fibrillation   History of Present Illness: Kimberly [name] is a 73 y.o. female who presents via audio conferencing for a telehealth visit today. Patient reports doing well since her last visit. She states that she had one episode of afib which lasted about 11 hours. She felt anxious and fatigued during the episode but her BP was stable and she had no SOB. Her FitBit showed variable heart rates from 60s to 150s. No other episodes.  Today, she denies symptoms of chest pain, shortness of breath, orthopnea, PND, lower extremity edema, claudication, dizziness, presyncope, syncope, bleeding, or neurologic sequela. The patient is tolerating medications without difficulties and is otherwise without complaint today.   she denies symptoms of cough, fevers, chills, or new SOB worrisome for COVID 19.     Atrial Fibrillation Risk Factors:  she does not have symptoms or diagnosis of sleep apnea. she does not have a history of rheumatic fever. she does not have a history of alcohol use. The patient does not have a history of early familial atrial fibrillation or other arrhythmias.  she has a BMI of There is no height or weight on file to calculate BMI.. There were no vitals filed for this visit.  Past Medical History:  Diagnosis Date  . Allergy   . Aortic atherosclerosis ([place])   . Atrial tachycardia  ([place]) 05/12/2015  . Cataract   . Colovesical fistula   . Diverticulitis   . Diverticulosis   . DJD (degenerative joint disease), cervical   . Frequent UTI   . GERD (gastroesophageal reflux disease)   . History of hiatal hernia    Small hernia  . Hypertension   . Internal hemorrhoids 01/21/2017   noted on colonopscopy  . Leukocytoclastic vasculitis ([place])   . Myalgia and myositis, unspecified   . Myasthenia gravis without exacerbation (HCC)    [facility], Dr Anna Genre  . NSVT (nonsustained ventricular tachycardia) ([place]) 05/12/2015  . Osteoarthritis    Both knees  . Osteopenia   . Other and unspecified hyperlipidemia   . Periumbilical hernia   . Pneumonia 04/2016  . PONV (postoperative nausea and vomiting)   . Psoriasis    plantar ;Dr Jarome Matin  . Sleep difficulties   . Vasculitis ([place])    Dr Estanislado Pandy  . Vitamin D deficiency    Past Surgical History:  Procedure Laterality Date  . APPENDECTOMY     & exploratory for inflammation; Dr Hassell Done  . CARDIAC CATHETERIZATION N/A 04/17/2015   Procedure: Left Heart Cath and Coronary Angiography;  Surgeon: Leonie Man, MD;  Location: [facility];  Service: Cardiovascular;  Laterality: N/A;  . CERVICAL FUSION     C5-6; Dr Vertell Limber  . CHOLECYSTECTOMY     for stones  . COLONOSCOPY W/ POLYPECTOMY  01/21/2017  . CYSTOSCOPY WITH STENT PLACEMENT Bilateral 03/18/2017   Procedure: CYSTOSCOPY WITH RETROGRADE AND BILATERAL URETERAL STENT  PLACEMENT;  Surgeon: Lucas Mallow, MD;  [facility];  Service: Urology;  Laterality: Bilateral;  . HERNIA REPAIR    . IR RADIOLOGIST EVAL & MGMT  11/16/2016  . IR RADIOLOGIST EVAL & MGMT  12/08/2016  . IR RADIOLOGIST EVAL & MGMT  12/16/2016  . LAMINECTOMY  2010   T1-2; Dr Vertell Limber  . PARTIAL COLECTOMY N/A 03/18/2017   Procedure: OPEN SIGMOIDECTOMY;  Surgeon: Leighton Ruff, MD;  [facility];  Service: General;  Laterality: N/A;  . SHOULDER SURGERY     Left & Right  . THORACENTESIS     for  peripneumonic effusion  . TOTAL ABDOMINAL HYSTERECTOMY W/ BILATERAL SALPINGOOPHORECTOMY     For Fibroids & endometriosis  . TOTAL SHOULDER ARTHROPLASTY    . UMBILICAL HERNIA REPAIR N/A 03/18/2017   Procedure: OPEN HERNIA REPAIR UMBILICAL ADULT;  Surgeon: Leighton Ruff, MD;  [facility];  Service: General;  Laterality: N/A;  ERAs pathway     Current Outpatient Medications  Medication Sig Dispense Refill  . acetaminophen (TYLENOL) 325 MG tablet Take 2 tablets (650 mg total) by mouth every 6 (six) hours as needed for mild pain (or temp > 100).    Marland Kitchen apixaban (ELIQUIS) 5 MG TABS tablet Take 1 tablet (5 mg total) by mouth 2 (two) times daily. 60 tablet 6  . CALCIUM PO Take 1,000 mg by mouth at bedtime.    . cholecalciferol (VITAMIN D) 1000 units tablet Take 1,000 Units by mouth at bedtime.    . folic acid (FOLVITE) 1 MG tablet TAKE 2 TABLETS EVERY DAY 180 tablet 4  . furosemide (LASIX) 40 MG tablet Take 0.5 tablets (20 mg total) by mouth daily. Annual physical w/labs are due must see provider for refills 30 tablet 0  . losartan (COZAAR) 100 MG tablet TAKE 1 TABLET BY MOUTH EVERY DAY 90 tablet 0  . Melatonin 10 MG CAPS Take by mouth.    . Multiple Vitamin (MULTIVITAMIN WITH MINERALS) TABS tablet Take 1 tablet by mouth at bedtime.    . predniSONE (DELTASONE) 5 MG tablet TAKE 1 TABLET BY MOUTH EVERY DAY WITH BREAKFAST 90 tablet 0  . pyridostigmine (MESTINON) 60 MG tablet Take 1 tablet (60 mg total) by mouth 3 (three) times daily. 270 tablet 3  . pyridOXINE (VITAMIN B-6) 100 MG tablet Take 100 mg by mouth at bedtime.    Marland Kitchen spironolactone (ALDACTONE) 25 MG tablet TAKE 1 TABLET BY MOUTH EVERY DAY 90 tablet 1  . vitamin B-12 (CYANOCOBALAMIN) 1000 MCG tablet Take 1,000 mcg by mouth at bedtime.     No current facility-administered medications for this encounter.     Allergies:   Azithromycin; Ibuprofen; Levaquin [levofloxacin in d5w]; Tetracycline; Isovue [iopamidol]; Sulfonamide derivatives; Aleve  [naproxen]; Tape; Zosyn [piperacillin sod-tazobactam so]; Amlodipine besy-benazepril hcl; and Amoxicillin   Social History:  The patient  reports that she has never smoked. She has never used smokeless tobacco. She reports that she does not drink alcohol or use drugs.   Family History:  The patient's  family history includes Cancer in her father; Heart attack (age of onset: 69) in her mother; Heart failure in her mother; Hypertension in her sister; Hyperthyroidism in her sister; Lung cancer in her father; Stroke in her paternal grandmother.    ROS:  Please see the history of present illness.   All other systems are personally reviewed and negative.   Recent Labs: 03/22/2018: BUN 19; Creatinine, Ser 0.90; Hemoglobin 12.5; Platelets 299; Potassium 4.1; Sodium  137; TSH 2.838  personally reviewed   Other studies personally reviewed: Additional studies/ records that were reviewed today include: Epic notes   ASSESSMENT AND PLAN:  1. Paroxysmal atrial fibrillation Patient has had one additional episode which converted spontaneously.  We discussed starting AAD or even diltiazem 30 mg PRN. Patient hesitant to start medication given myasthenia gravis. She is agreeable to trying medication if her symptoms happen more frequently or last longer. Education given about what to do if she should go into afib again. Continue Eliquis 5 mg BID  This patients CHA2DS2-VASc Score and unadjusted Ischemic Stroke Rate (% per year) is equal to 4.8 % stroke rate/year from a score of 4  Above score calculated as 1 point each if present [CHF, HTN, DM, Vascular=MI/PAD/Aortic Plaque, Age if 65-74, or Female] Above score calculated as 2 points each if present [Age > 75, or Stroke/TIA/TE]  2. Obesity Lifestyle modification was discussed and encouraged including regular physical activity and weight reduction.  3. HTN Stable, no changes  COVID screen The patient does not have any symptoms that suggest any further  testing/ screening at this time.  Social distancing reinforced today.    Follow-up for telehealth visit with AF clinic in 2 months.   Current medicines are reviewed at length with the patient today.   The patient does not have concerns regarding her medicines.  The following changes were made today:  none  Labs/ tests ordered today include:  No orders of the defined types were placed in this encounter.   Patient Risk:  after full review of this patients clinical status, I feel that they are at moderate risk at this time.   Today, I have spent 16 minutes with the patient with telehealth technology discussing atrial fibrillation, medication options, and COVID-19 precautions.    Gwenlyn Perking PA-C 04/20/2018 2:02 PM  Af[facility] [address] [place], [place] [address] [phone]   I hereby voluntarily request, consent and authorize the [facility] and its employed or contracted physicians, physician assistants, nurse practitioners or other licensed health care professionals (the Practitioner), to provide me with telemedicine health care services (the "Services") as deemed necessary by the treating Practitioner. I acknowledge and consent to receive the Services by the Practitioner via telemedicine. I understand that the telemedicine visit will involve communicating with the Practitioner through live audiovisual communication technology and the disclosure of certain medical information by electronic transmission. I acknowledge that I have been given the opportunity to request an in-person assessment or other available alternative prior to the telemedicine visit and am voluntarily participating in the telemedicine visit.   I understand that I have the right to withhold or withdraw my consent to the use of telemedicine in the course of my care at any time, without affecting my right to future care or treatment, and that the Practitioner or I may  terminate the telemedicine visit at any time. I understand that I have the right to inspect all information obtained and/or recorded in the course of the telemedicine visit and may receive copies of available information for a reasonable fee.  I understand that some of the potential risks of receiving the Services via telemedicine include:   Delay or interruption in medical evaluation due to technological equipment failure or disruption;  Information transmitted may not be sufficient (e.g. poor resolution of images) to allow for appropriate medical decision making by the Practitioner; and/or  In rare instances, security protocols could fail, causing a  breach of personal health information.   Furthermore, I acknowledge that it is my responsibility to provide information about my medical history, conditions and care that is complete and accurate to the best of my ability. I acknowledge that Practitioner's advice, recommendations, and/or decision may be based on factors not within their control, such as incomplete or inaccurate data provided by me or distortions of diagnostic images or specimens that may result from electronic transmissions. I understand that the practice of medicine is not an exact science and that Practitioner makes no warranties or guarantees regarding treatment outcomes. I acknowledge that I will receive a copy of this consent concurrently upon execution via email to the email address I last provided but may also request a printed copy by calling the office of the [facility].  I understand that my insurance will be billed for this visit.   I have read or had this consent read to me.  I understand the contents of this consent, which adequately explains the benefits and risks of the Services being provided via telemedicine.  I have been provided ample opportunity to ask questions regarding this consent and the Services and have had my questions answered to my satisfaction.   I give my informed consent for the services to be provided through the use of telemedicine in my medical care  By participating in this telemedicine visit I agree to the above.

## 2018-05-15 ENCOUNTER — Telehealth: Payer: Self-pay | Admitting: *Deleted

## 2018-05-15 NOTE — Telephone Encounter (Signed)
05/15/18 LMOM @ 3:19 pm,re: follow up appointment.

## 2018-05-17 ENCOUNTER — Telehealth: Payer: Self-pay | Admitting: Internal Medicine

## 2018-05-17 ENCOUNTER — Other Ambulatory Visit: Payer: Self-pay | Admitting: Cardiovascular Disease

## 2018-05-17 ENCOUNTER — Other Ambulatory Visit: Payer: Self-pay | Admitting: Rheumatology

## 2018-05-17 ENCOUNTER — Other Ambulatory Visit: Payer: Self-pay | Admitting: Internal Medicine

## 2018-05-17 DIAGNOSIS — I10 Essential (primary) hypertension: Secondary | ICD-10-CM

## 2018-05-17 NOTE — Telephone Encounter (Signed)
Spironolactone 25 mg refilled 

## 2018-05-17 NOTE — Telephone Encounter (Signed)
Last Visit: 08/26/2017 Next Visit: message sent to the front desk to schedule.   Okay to refill per Dr. Estanislado Pandy.

## 2018-05-17 NOTE — Progress Notes (Signed)
Virtual Visit via Telephone Note - she is unable to do virtual video visit  I connected with LAURI PURDUM on 05/18/18 at  8:30 AM EDT by telephone and verified that I am speaking with the correct person using two identifiers.   I discussed the limitations of evaluation and management by telemedicine and the availability of in person appointments. The patient expressed understanding and agreed to proceed.  The patient is currently at home and I am in the office.  Verbal consent for services obtained from patient prior to services given.  Names of all persons present for services: [name], MD, Westley Hummer   No referring provider.   Cumulative time during 7-day interval 15 minutes, there was not an associated office visit for this concern within a 7 day period.    History of Present Illness: She is here for follow up of her chronic medical conditions.   She has some exercise - a little walking  OA:  Her joint pain has been terrible.  She does follow with Dr Estanislado Pandy.  She is taking Tylenol.  Afib, Hypertension: She is taking her medication daily. She is compliant with a low sodium diet.  She has had a couple of episodes of Afib. she was put back on eliquis.  She denies chest pain, palpitations now, shortness of breath and regular headaches. She does monitor her blood pressure at home.  Myasthenia gravis: She is following with neurology, Dr. Posey Pronto.  She is taking her Mestinon as prescribed.  Bilateral leg edema:  She is taking lasix daily.   She feels her edema is well controlled and currently denies any leg swelling.  Diarrhea:  I saw her last in February for diarrhea.   She started to add some of her food back into her diet she was avoiding and her diarrhea resolved.  She can not eat lettuce and beef.  She is eating protein bars because she does not eat a lot of meat.  Overall at this point she feels her bowel movements are normal.   Review of Systems  Constitutional:  Negative for chills and fever.  Respiratory: Positive for cough (allergy related). Negative for shortness of breath and wheezing.   Cardiovascular: Negative for chest pain, palpitations (last Afib 4/16) and leg swelling.  Gastrointestinal: Negative for diarrhea.  Neurological: Negative for headaches.     Social History   Socioeconomic History  . Marital status: Married    Spouse name: Not on file  . Number of children: Not on file  . Years of education: Not on file  . Highest education level: Not on file  Occupational History  . 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 Financial resource strain: Not on file  . Food insecurity:    Worry: Not on file    Inability: Not on file  . Transportation needs:    Medical: Not on file    Non-medical: Not on file  Tobacco Use  . Smoking status: Never Smoker  . Smokeless tobacco: Never Used  Substance and Sexual Activity  . Alcohol use: No    Alcohol/week: 0.0 standard drinks  . Drug use: No  . Sexual activity: Not Currently  Lifestyle  . Physical activity:    Days per week: Not on file    Minutes per session: Not on file  . Stress: Not on file  Relationships  . Social connections:    Talks on phone: Not on file  Gets together: Not on file    Attends religious service: Not on file    Active member of club or organization: Not on file    Attends meetings of clubs or organizations: Not on file    Relationship status: Not on file  Other Topics Concern  . Not on file  Social History Narrative   Lives with husband in a one story home.  Had one child that only lived for 14 hours.     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          Epworth Sleepiness Scale = 2 (as of 03/14/2015)     Observations/Objective: At home vitals:  BP 107/70  Oxygen 98% HR 69 Weight 287   Assessment and Plan:  See Problem List for Assessment and Plan of chronic medical  problems.   Follow Up Instructions:    I discussed the assessment and treatment plan with the patient. The patient was provided an opportunity to ask questions and all were answered. The patient agreed with the plan and demonstrated an understanding of the instructions.   The patient was advised to call back or seek an in-person evaluation if the symptoms worsen or if the condition fails to improve as anticipated.  Ideally follow-up in office in 6 months  [name], MD

## 2018-05-17 NOTE — Telephone Encounter (Signed)
Okay to schedule a telephone visit

## 2018-05-17 NOTE — Telephone Encounter (Signed)
Patient called stating that she is needing a refill on her Furosemide and Losartan. Medications were denied due to patient needing a physical with blood work. Patient called to schedule but states she has no way to do a virtual visit and does not wish to come in due to her health issues and possible exposure. She asked if she could do a phone call visit with you instead? (Patient has Medicare with a BCBS Supplement) She mentioned that Cardiology recently did an EKG and blood work if these can be used as Magazine features editor for you.   Please advise.

## 2018-05-17 NOTE — Telephone Encounter (Signed)
Appointment scheduled.

## 2018-05-18 ENCOUNTER — Encounter: Payer: Self-pay | Admitting: Internal Medicine

## 2018-05-18 ENCOUNTER — Ambulatory Visit (INDEPENDENT_AMBULATORY_CARE_PROVIDER_SITE_OTHER): Payer: Medicare Other | Admitting: Internal Medicine

## 2018-05-18 DIAGNOSIS — R600 Localized edema: Secondary | ICD-10-CM | POA: Diagnosis not present

## 2018-05-18 DIAGNOSIS — R197 Diarrhea, unspecified: Secondary | ICD-10-CM | POA: Diagnosis not present

## 2018-05-18 DIAGNOSIS — I480 Paroxysmal atrial fibrillation: Secondary | ICD-10-CM

## 2018-05-18 DIAGNOSIS — I10 Essential (primary) hypertension: Secondary | ICD-10-CM | POA: Diagnosis not present

## 2018-05-18 DIAGNOSIS — G7000 Myasthenia gravis without (acute) exacerbation: Secondary | ICD-10-CM

## 2018-05-18 MED ORDER — LOSARTAN POTASSIUM 100 MG PO TABS: 100.0000 mg | ORAL_TABLET | Freq: Every day | ORAL | 1 refills | Status: DC

## 2018-05-18 MED ORDER — FUROSEMIDE 40 MG PO TABS: 20.0000 mg | ORAL_TABLET | Freq: Every day | ORAL | 1 refills | Status: DC

## 2018-05-18 NOTE — Assessment & Plan Note (Signed)
Blood pressure at home today 107/70  Blood pressure well controlled Continue current medications

## 2018-05-18 NOTE — Assessment & Plan Note (Signed)
Management per neurology, Dr. Posey Pronto Taking Mestinon as prescribed

## 2018-05-18 NOTE — Assessment & Plan Note (Signed)
Following with cardiology A few recent episodes Her face feels sunburned, feels pulse throughout body, fatigue Placed back on eliquis Avoiding CCB and BB due to myasthenia gravis

## 2018-05-18 NOTE — Assessment & Plan Note (Signed)
She revised her diet-started eating the food she was avoiding because she thought they were causing her diarrhea and her diarrhea actually improved Not able to eat lettuce or beef Making sure she is getting enough protein

## 2018-05-18 NOTE — Assessment & Plan Note (Signed)
Stable, controlled Continue furosemide 20 mg daily

## 2018-07-21 NOTE — Progress Notes (Deleted)
Office Visit Note  Patient: CHARNESE FEDERICI             Date of Birth: 11-05-1945           MRN: [id number]             PCP: [name], MD Referring: [name], MD Visit Date: 08/03/2018 Occupation: @GUAROCC @  Subjective:  No chief complaint on file.   History of Present Illness: Kimberly [name] is a 73 y.o. female ***   Activities of Daily Living:  Patient reports morning stiffness for *** {minute/hour:19697}.   Patient {ACTIONS;DENIES/REPORTS:21021675::"Denies"} nocturnal pain.  Difficulty dressing/grooming: {ACTIONS;DENIES/REPORTS:21021675::"Denies"} Difficulty climbing stairs: {ACTIONS;DENIES/REPORTS:21021675::"Denies"} Difficulty getting out of chair: {ACTIONS;DENIES/REPORTS:21021675::"Denies"} Difficulty using hands for taps, buttons, cutlery, and/or writing: {ACTIONS;DENIES/REPORTS:21021675::"Denies"}  No Rheumatology ROS completed.   PMFS History:  Patient Active Problem List   Diagnosis Date Noted  . Diarrhea 03/10/2018  . Diverticular stricture ([place]) 03/18/2017  . Sleep difficulties 10/31/2016  . Bilateral leg edema 08/20/2016  . Recurrent UTI 08/15/2016  . Psoriasis 04/13/2016  . History of diverticulitis 04/13/2016  . Osteopenia of multiple sites 04/13/2016  . DJD (degenerative joint disease), cervical 04/07/2016  . Primary osteoarthritis of both knees 04/07/2016  . Paroxysmal atrial fibrillation ([place]) 05/12/2015  . NSVT (nonsustained ventricular tachycardia) ([place]) 05/12/2015  . Abnormal nuclear stress test - INTERMEDIATE RISK 04/14/2015  . Coronary artery calcification seen on CAT scan 02/28/2015  . Hyperglycemia 08/09/2014  . Dyspnea 03/20/2013  . Umbilical hernia 49/70/2637  . Pleural effusion 05/10/2012  . Myasthenia gravis without exacerbation ([place]) 02/12/2010  . Vitamin D deficiency 06/25/2008  . Hyperlipidemia 06/25/2008  . Essential hypertension 06/25/2008  . Leukocytoclastic vasculitis ([place]) 06/19/2007  . ANA POSITIVE, HX OF  01/24/2007    Past Medical History:  Diagnosis Date  . Allergy   . Aortic atherosclerosis ([place])   . Atrial tachycardia ([place]) 05/12/2015  . Cataract   . Colovesical fistula   . Diverticulitis   . Diverticulosis   . DJD (degenerative joint disease), cervical   . Frequent UTI   . GERD (gastroesophageal reflux disease)   . History of hiatal hernia    Small hernia  . Hypertension   . Internal hemorrhoids 01/21/2017   noted on colonopscopy  . Leukocytoclastic vasculitis ([place])   . Myalgia and myositis, unspecified   . Myasthenia gravis without exacerbation ([facility])    [facility], Dr Anna Genre  . NSVT (nonsustained ventricular tachycardia) ([place]) 05/12/2015  . Osteoarthritis    Both knees  . Osteopenia   . Other and unspecified hyperlipidemia   . Periumbilical hernia   . Pneumonia 04/2016  . PONV (postoperative nausea and vomiting)   . Psoriasis    plantar ;Dr Jarome Matin  . Sleep difficulties   . Vasculitis ([place])    Dr Estanislado Pandy  . Vitamin D deficiency     Family History  Problem Relation Age of Onset  . Heart attack Mother 22  . Heart failure Mother   . Stroke Paternal Grandmother        in 69s  . Lung cancer Father   . Cancer Father        lung  . Hyperthyroidism Sister        [place]  . Hypertension Sister   . Diabetes Neg Hx   . Colon cancer Neg Hx   . Esophageal cancer Neg Hx   . Stomach cancer Neg Hx   . Rectal cancer Neg Hx    Past Surgical History:  Procedure Laterality  Date  . APPENDECTOMY     & exploratory for inflammation; Dr Hassell Done  . CARDIAC CATHETERIZATION N/A 04/17/2015   Procedure: Left Heart Cath and Coronary Angiography;  Surgeon: Leonie Man, MD;  Location: [facility];  Service: Cardiovascular;  Laterality: N/A;  . CERVICAL FUSION     C5-6; Dr Vertell Limber  . CHOLECYSTECTOMY     for stones  . COLONOSCOPY W/ POLYPECTOMY  01/21/2017  . CYSTOSCOPY WITH STENT PLACEMENT Bilateral 03/18/2017   Procedure: CYSTOSCOPY WITH RETROGRADE AND BILATERAL URETERAL STENT  PLACEMENT;  Surgeon: Lucas Mallow, MD;  Location: [facility];  Service: Urology;  Laterality: Bilateral;  . HERNIA REPAIR    . IR RADIOLOGIST EVAL & MGMT  11/16/2016  . IR RADIOLOGIST EVAL & MGMT  12/08/2016  . IR RADIOLOGIST EVAL & MGMT  12/16/2016  . LAMINECTOMY  2010   T1-2; Dr Vertell Limber  . PARTIAL COLECTOMY N/A 03/18/2017   Procedure: OPEN SIGMOIDECTOMY;  Surgeon: Leighton Ruff, MD;  Location: [facility];  Service: General;  Laterality: N/A;  . SHOULDER SURGERY     Left & Right  . THORACENTESIS     for peripneumonic effusion  . TOTAL ABDOMINAL HYSTERECTOMY W/ BILATERAL SALPINGOOPHORECTOMY     For Fibroids & endometriosis  . TOTAL SHOULDER ARTHROPLASTY    . UMBILICAL HERNIA REPAIR N/A 03/18/2017   Procedure: OPEN HERNIA REPAIR UMBILICAL ADULT;  Surgeon: Leighton Ruff, MD;  Location: [facility];  Service: General;  Laterality: N/A;  ERAs pathway   Social History   Social History Narrative   Lives with husband in a one story home.  Had one child that only lived for 14 hours.     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          Epworth Sleepiness Scale = 2 (as of 03/14/2015)   Immunization History  Administered Date(s) Administered  . Influenza, High Dose Seasonal PF 09/11/2012, 11/01/2016, 11/24/2017  . Influenza,inj,Quad PF,6+ Mos 10/12/2014  . Influenza-Unspecified 10/11/2013, 10/24/2015  . Pneumococcal Conjugate-13 05/09/2015  . Pneumococcal Polysaccharide-23 04/19/2013     Objective: Vital Signs: There were no vitals taken for this visit.   Physical Exam   Musculoskeletal Exam: ***  CDAI Exam: CDAI Score: - Patient Global: -; Provider Global: - Swollen: -; Tender: - Joint Exam   No joint exam has been documented for this visit   There is currently no information documented on the homunculus. Go to the Rheumatology activity and complete the homunculus joint exam.  Investigation: No additional findings.  Imaging: No results found.   Recent Labs: Lab Results  Component Value Date   WBC 11.5 (H) 03/22/2018   HGB 12.5 03/22/2018   PLT 299 03/22/2018   NA 137 03/22/2018   K 4.1 03/22/2018   CL 103 03/22/2018   CO2 25 03/22/2018   GLUCOSE 95 03/22/2018   BUN 19 03/22/2018   CREATININE 0.90 03/22/2018   BILITOT 0.7 10/12/2016   ALKPHOS 54 10/12/2016   AST 30 10/12/2016   ALT 26 10/12/2016   PROT 6.5 10/12/2016   ALBUMIN 3.1 (L) 10/12/2016   CALCIUM 9.5 03/22/2018   GFRAA >60 03/22/2018    Speciality Comments: No specialty comments available.  Procedures:  No procedures performed Allergies: Azithromycin, Ibuprofen, Levaquin [levofloxacin in d5w], Tetracycline, Isovue [iopamidol], Sulfonamide derivatives, Aleve [naproxen], Tape, Zosyn [piperacillin sod-tazobactam so], Amlodipine besy-benazepril hcl, and Amoxicillin   Assessment / Plan:     Visit Diagnoses: No diagnosis found.  Orders: No orders of the defined types were placed in this encounter.  No orders of the defined types were placed in this encounter.   Face-to-face time spent with patient was *** minutes. Greater than 50% of time was spent in counseling and coordination of care.  Follow-Up Instructions: No follow-ups on file.   Ofilia Neas, PA-C  Note - This record has been created using Dragon software.  Chart creation errors have been sought, but [place] not always  have been located. Such creation errors do not reflect on  the standard of medical care.

## 2018-08-02 ENCOUNTER — Ambulatory Visit: Payer: Medicare Other | Admitting: Rheumatology

## 2018-08-03 ENCOUNTER — Ambulatory Visit: Payer: Medicare Other | Admitting: Physician Assistant

## 2018-08-04 NOTE — Progress Notes (Signed)
Virtual Visit viaTelephone Note  I connected with Kimberly [name] on 08/04/18 at  2:00 PM EDT by telephone enabled telemedicine application and verified that I am speaking with the correct person using two identifiers.  Location: Patient: Home  Provider: Clinic  This service was conducted via virtual visit.  She was unable to use webex, so we reached her by telephone. The patient was located at home. I was located in my office.  Consent was obtained prior to the virtual visit and is aware of possible charges through their insurance for this visit.  The patient is an established patient.  Dr. Estanislado Pandy, MD conducted the virtual visit and [name], PA-C acted as scribe during the service.  Office staff helped with scheduling follow up visits after the service was conducted.   I discussed the limitations of evaluation and management by telemedicine and the availability of in person appointments. The patient expressed understanding and agreed to proceed.   CC: Pain in multiple joints   History of Present Illness: Patient is a 73 y.o. female with history of leukocytoclastic vasculitis, DDD, and osteoarthritis. Patient is on Prednisone 5 mg po daily. she has not had any recent vasculitis flares or recurrences.  She is having increased pain in multiple joints.  She is having pain in shoulders, elbows, right hip, and knee joints.  She states she sleeps on her right side and it actually improves her pain.  She states the pain is worse if sitting and better when walking.  She continues to have chronic lower back pain.  She denies any joint swelling.  She has not had any recent rashes.  She has no psoriasis at this time.    Review of Systems  Constitutional: Negative for fever and malaise/fatigue.  Eyes: Negative for photophobia, pain, discharge and redness.  Respiratory: Negative for cough, shortness of breath and wheezing.   Cardiovascular: Negative for chest pain and palpitations.   Gastrointestinal: Negative for blood in stool, constipation and diarrhea.  Genitourinary: Negative for dysuria.  Musculoskeletal: Positive for back pain and joint pain. Negative for myalgias and neck pain.  Skin: Negative for rash.  Neurological: Negative for dizziness and headaches.  Psychiatric/Behavioral: Negative for depression. The patient is not nervous/anxious and does not have insomnia.     Observations/Objective: Physical Exam  Constitutional: She is oriented to person, place, and time and well-developed, well-nourished, and in no distress.  HENT:  Head: Normocephalic and atraumatic.  Eyes: Conjunctivae are normal.  Pulmonary/Chest: Effort normal.  Neurological: She is alert and oriented to person, place, and time.  Psychiatric: Mood, memory, affect and judgment normal.   Patient reports morning stiffness for 1  hour.   Patient reports nocturnal pain.  Difficulty dressing/grooming: Denies Difficulty climbing stairs: Reports Difficulty getting out of chair: Reports Difficulty using hands for taps, buttons, cutlery, and/or writing: Denies  Assessment and Plan: Visit Diagnoses: Leukocytoclastic vasculitis ([place]) - Bx proven, lower extremity involvement: She has not have had recent flares or recurrences She has been off of MTX since September 2018 due to recurrent infections.  She continues to take prednisone 5 mg po daily.  She was advised to notify us if she develops any signs or symptoms of a flare. She is unable to further taper prednisone.  A refill of prednisone was sent to the pharmacy today. She will follow up in 6 months.  High risk medication use -She takes Prednsione 5 mg qd.  A refill of prednisone was sent today. She has been off  MTX since September 2018 due to having a colovesicular fistula.  She had surgery for correction in March 2019.  She has lab monitoring with her PCP.  DDD (degenerative disc disease), cervical: She has not had any neck pain recently.  She  has no symptoms of radiculopathy.  Primary osteoarthritis of both knees: She has chronic pain in both knee joints.  She has no joint swelling.  She is not ready to proceed with knee replacements at this time.  H/O repair of rotator cuff: Right: Doing well. She has intermittent pain in the right shoulder joint.   Psoriasis -Managed by Dr. Jarome Matin. She has no active psoriasis at this time.   Osteopenia of multiple sites - Followed by PCP. She takes calcium and vitamin D on a regular basis.   Trochanteric bursitis, right hip: She has been experiencing tenderness and radiating pain down the right IT band for the past several months.  She has been more sedentary during quarantine.  She declined physical therapy at this time.  We will mail exercises for her to perform.  If her pain persists or worsens, we will refer her to Dr. Ernestina Patches for a cortisone injection.   Follow Up Instructions: She will follow up in 6 months.   I discussed the assessment and treatment plan with the patient. The patient was provided an opportunity to ask questions and all were answered. The patient agreed with the plan and demonstrated an understanding of the instructions.   The patient was advised to call back or seek an in-person evaluation if the symptoms worsen or if the condition fails to improve as anticipated.  I provided 25 minutes of non-face-to-face time during this encounter. Bo Merino, MD   Scribed by-  Ofilia Neas, PA-C

## 2018-08-09 ENCOUNTER — Other Ambulatory Visit: Payer: Self-pay | Admitting: Rheumatology

## 2018-08-09 NOTE — Telephone Encounter (Signed)
Last Visit: 08/26/2017 Next Visit: 08/11/2018  Okay to refill per Dr. Estanislado Pandy

## 2018-08-11 ENCOUNTER — Other Ambulatory Visit: Payer: Self-pay

## 2018-08-11 ENCOUNTER — Telehealth (INDEPENDENT_AMBULATORY_CARE_PROVIDER_SITE_OTHER): Payer: Medicare Other | Admitting: Rheumatology

## 2018-08-11 ENCOUNTER — Encounter: Payer: Self-pay | Admitting: Rheumatology

## 2018-08-11 DIAGNOSIS — M310 Hypersensitivity angiitis: Secondary | ICD-10-CM | POA: Diagnosis not present

## 2018-08-11 DIAGNOSIS — M8589 Other specified disorders of bone density and structure, multiple sites: Secondary | ICD-10-CM

## 2018-08-11 DIAGNOSIS — M170 Bilateral primary osteoarthritis of knee: Secondary | ICD-10-CM

## 2018-08-11 DIAGNOSIS — G7000 Myasthenia gravis without (acute) exacerbation: Secondary | ICD-10-CM

## 2018-08-11 DIAGNOSIS — Z79899 Other long term (current) drug therapy: Secondary | ICD-10-CM | POA: Diagnosis not present

## 2018-08-11 DIAGNOSIS — Z8639 Personal history of other endocrine, nutritional and metabolic disease: Secondary | ICD-10-CM

## 2018-08-11 DIAGNOSIS — M7061 Trochanteric bursitis, right hip: Secondary | ICD-10-CM | POA: Diagnosis not present

## 2018-08-11 DIAGNOSIS — M5030 Other cervical disc degeneration, unspecified cervical region: Secondary | ICD-10-CM | POA: Diagnosis not present

## 2018-08-11 DIAGNOSIS — Z8679 Personal history of other diseases of the circulatory system: Secondary | ICD-10-CM | POA: Diagnosis not present

## 2018-08-11 DIAGNOSIS — L409 Psoriasis, unspecified: Secondary | ICD-10-CM | POA: Diagnosis not present

## 2018-08-11 DIAGNOSIS — Z8719 Personal history of other diseases of the digestive system: Secondary | ICD-10-CM

## 2018-08-11 DIAGNOSIS — Z9889 Other specified postprocedural states: Secondary | ICD-10-CM

## 2018-08-11 MED ORDER — PREDNISONE 5 MG PO TABS: ORAL_TABLET | ORAL | 0 refills | Status: DC

## 2018-09-22 ENCOUNTER — Other Ambulatory Visit: Payer: Self-pay | Admitting: *Deleted

## 2018-09-22 NOTE — Telephone Encounter (Signed)
Error

## 2018-10-11 ENCOUNTER — Telehealth (INDEPENDENT_AMBULATORY_CARE_PROVIDER_SITE_OTHER): Payer: Medicare Other | Admitting: Cardiovascular Disease

## 2018-10-11 ENCOUNTER — Encounter: Payer: Self-pay | Admitting: Cardiovascular Disease

## 2018-10-11 DIAGNOSIS — I480 Paroxysmal atrial fibrillation: Secondary | ICD-10-CM | POA: Diagnosis not present

## 2018-10-11 DIAGNOSIS — I10 Essential (primary) hypertension: Secondary | ICD-10-CM | POA: Diagnosis not present

## 2018-10-11 DIAGNOSIS — I2510 Atherosclerotic heart disease of native coronary artery without angina pectoris: Secondary | ICD-10-CM

## 2018-10-11 DIAGNOSIS — I472 Ventricular tachycardia: Secondary | ICD-10-CM | POA: Diagnosis not present

## 2018-10-11 DIAGNOSIS — I4729 Other ventricular tachycardia: Secondary | ICD-10-CM

## 2018-10-11 MED ORDER — FLECAINIDE ACETATE 150 MG PO TABS: 150.0000 mg | ORAL_TABLET | ORAL | 5 refills | Status: DC | PRN

## 2018-10-11 NOTE — Patient Instructions (Signed)
Medication Instructions:  START FLECAINIDE 150 MG AS NEEDED FOR ATRIAL FIBRILLATION   If you need a refill on your cardiac medications before your next appointment, please call your pharmacy.   Lab work: NONE  Testing/Procedures: NONE  Follow-Up: At [facility], you and your health needs are our priority.  As part of our continuing mission to provide you with exceptional heart care, we have created designated Provider Care Teams.  These Care Teams include your primary Cardiologist (physician) and Advanced Practice Providers (APPs -  Physician Assistants and Nurse Practitioners) who all work together to provide you with the care you need, when you need it. You will need a follow up appointment in 4 months.  Please call our office 2 months in advance to schedule this appointment.  You may see Skeet Latch, MD or one of the following Advanced Practice Providers on your designated Care Team:   Kerin Ransom, PA-C Roby Lofts, [place] . Sande Rives, PA-C

## 2018-10-11 NOTE — Progress Notes (Signed)
Virtual Visit via Telephone Note   This visit type was conducted due to national recommendations for restrictions regarding the COVID-19 Pandemic (e.g. social distancing) in an effort to limit this patient's exposure and mitigate transmission in our community.  Due to her co-morbid illnesses, this patient is at least at moderate risk for complications without adequate follow up.  This format is felt to be most appropriate for this patient at this time.  The patient did not have access to video technology/had technical difficulties with video requiring transitioning to audio format only (telephone).  All issues noted in this document were discussed and addressed.  No physical exam could be performed with this format.  Please refer to the patient's chart for her  consent to telehealth for [facility].   Date:  10/11/2018   ID:  Kimberly [name], DOB 01/31/1945, MRN [id number]  Patient Location: Home Provider Location: Office  PCP:  [name], MD  Cardiologist:  Skeet Latch, MD  Electrophysiologist:  None   Evaluation Performed:  Follow-Up Visit  Chief Complaint:  Atrial fibrillation  History of Present Illness:    Kimberly [name] is a 73 y.o. female with paroxysmal atrial fibrillation, hypertension, coronary artery calcification, myasthenia gravis, recurrent R pleural effusion, hyperlipidemia who presents for follow up. She was initially seen 03/2015 for evaluation of asymptomatic coronary calcifications.  She had a chest CT to follow up on pleural effusions and was noted to have coronary calcification. She was asymptomatic but did not exercise much due to myasthenia.  She had a [employer] 03/2015 that was concerning for inferior and apical ischemia.  She subsequently underwent left heart catheterization 04/2015 that showed normal, but tortuous coronary arteries.  She reported palpitations and had a 30 day event monitor that showed paroxysmal atrial tachycardia and NSVT.  She was not started on any nodal agents due to her history of myasthenia.  She had an echo 05/2015 that revealed LVEF 60-65% with normal diastolic function.  Since her last appointment had one episode of fast heart rates.  It always occurs at night.  This episode lasted for less than one hour.  There was no lightheadedness, dizziness or shortness of breath.  She was also hospitalized for pneumonia in April and she is finally recovering her strength.  She continues to struggle with recurrent UTIs.  She has bee on 9 different antibiotics.  She is scheduled to follow up with urology next week.  Kimberly [name] also has R LE lymphedema.  She is planning to have knee surgery.  She denies orthopnea or PND.  Kimberly [name] was admitted 03/2017 for repair of her colovesicular fistula.  On postoperative day 6 she developed atrial fibrillation with rapid ventricular response.  She received 1 dose of IV metoprolol but was switched to amiodarone given her myasthenia.  She converted back to sinus rhythm on amiodarone and was started on Eliquis.  She followed up with Almyra Deforest, PA, 03/2017 and reported dizziness and nausea that was thought to be due to the amiodarone.  Given that her atrial for relation was thought to be postoperative amiodarone was decreased with plans to discontinue it.  She did not have any recurrent atrial fibrillation after that time.  She again wore a 30-day event monitor that was without atrial fibrillation. She had an episode of afib 03/2018 and was seen in the atrial fibrillation clinic.  She was restarted on Eliquis.    Since she was last seen in atrial fibrillation clinic in April  she had three episodes.  One occurred for six hours and she felt poorly.  She felt her heart quivering but wasn't short of breath.  She then felt exhausted.  She also had two mild episodes that lasted 45 minutes and two hours, respectively.  She thinks that two of the episodes were trigger  No cp, sob, edema Walking in the  house, not much exercise    The patient does not have symptoms concerning for COVID-19 infection (fever, chills, cough, or new shortness of breath).    Past Medical History:  Diagnosis Date  . Allergy   . Aortic atherosclerosis ([place])   . Atrial tachycardia ([place]) 05/12/2015  . Cataract   . Colovesical fistula   . Diverticulitis   . Diverticulosis   . DJD (degenerative joint disease), cervical   . Frequent UTI   . GERD (gastroesophageal reflux disease)   . History of hiatal hernia    Small hernia  . Hypertension   . Internal hemorrhoids 01/21/2017   noted on colonopscopy  . Leukocytoclastic vasculitis ([place])   . Myalgia and myositis, unspecified   . Myasthenia gravis without exacerbation ([facility])    [facility], Dr Anna Genre  . NSVT (nonsustained ventricular tachycardia) ([place]) 05/12/2015  . Osteoarthritis    Both knees  . Osteopenia   . Other and unspecified hyperlipidemia   . Periumbilical hernia   . Pneumonia 04/2016  . PONV (postoperative nausea and vomiting)   . Psoriasis    [place] ;Dr Jarome Matin  . Sleep difficulties   . Vasculitis ([place])    Dr Estanislado Pandy  . Vitamin D deficiency    Past Surgical History:  Procedure Laterality Date  . APPENDECTOMY     & exploratory for inflammation; Dr Hassell Done  . CARDIAC CATHETERIZATION N/A 04/17/2015   Procedure: Left Heart Cath and Coronary Angiography;  Surgeon: Leonie Man, MD;  Location: [place] [facility];  Service: Cardiovascular;  Laterality: N/A;  . CERVICAL FUSION     C5-6; Dr Vertell Limber  . CHOLECYSTECTOMY     for stones  . COLONOSCOPY W/ POLYPECTOMY  01/21/2017  . CYSTOSCOPY WITH STENT PLACEMENT Bilateral 03/18/2017   Procedure: CYSTOSCOPY WITH RETROGRADE AND BILATERAL URETERAL STENT PLACEMENT;  Surgeon: Lucas Mallow, MD;  Location: [facility];  Service: Urology;  Laterality: Bilateral;  . HERNIA REPAIR    . IR RADIOLOGIST EVAL & MGMT  11/16/2016  . IR RADIOLOGIST EVAL & MGMT  12/08/2016  . IR RADIOLOGIST EVAL & MGMT  12/16/2016  .  LAMINECTOMY  2010   T1-2; Dr Vertell Limber  . PARTIAL COLECTOMY N/A 03/18/2017   Procedure: OPEN SIGMOIDECTOMY;  Surgeon: Leighton Ruff, MD;  Location: [facility];  Service: General;  Laterality: N/A;  . SHOULDER SURGERY     Left & Right  . THORACENTESIS     for peripneumonic effusion  . TOTAL ABDOMINAL HYSTERECTOMY W/ BILATERAL SALPINGOOPHORECTOMY     For Fibroids & endometriosis  . TOTAL SHOULDER ARTHROPLASTY    . UMBILICAL HERNIA REPAIR N/A 03/18/2017   Procedure: OPEN HERNIA REPAIR UMBILICAL ADULT;  Surgeon: Leighton Ruff, MD;  Location: [facility];  Service: General;  Laterality: N/A;  ERAs pathway     Current Meds  Medication Sig  . acetaminophen (TYLENOL) 325 MG tablet Take 2 tablets (650 mg total) by mouth every 6 (six) hours as needed for mild pain (or temp > 100).  Marland Kitchen apixaban (ELIQUIS) 5 MG TABS tablet Take 1 tablet (5 mg total) by mouth 2 (two) times daily.  Marland Kitchen  CALCIUM PO Take 1,000 mg by mouth at bedtime.  . cholecalciferol (VITAMIN D) 1000 units tablet Take 1,000 Units by mouth at bedtime.  . furosemide (LASIX) 40 MG tablet Take 0.5 tablets (20 mg total) by mouth daily.  Marland Kitchen losartan (COZAAR) 100 MG tablet Take 1 tablet (100 mg total) by mouth daily.  . Melatonin 10 MG CAPS Take by mouth.  . Multiple Vitamin (MULTIVITAMIN WITH MINERALS) TABS tablet Take 1 tablet by mouth at bedtime.  . predniSONE (DELTASONE) 5 MG tablet Take 1 tablet (5 mg total) by mouth daily with breakfast.  . pyridostigmine (MESTINON) 60 MG tablet Take 60 mg by mouth 2 (two) times daily.  Marland Kitchen pyridOXINE (VITAMIN B-6) 100 MG tablet Take 100 mg by mouth at bedtime.  Marland Kitchen spironolactone (ALDACTONE) 25 MG tablet TAKE 1 TABLET BY MOUTH EVERY DAY  . vitamin B-12 (CYANOCOBALAMIN) 1000 MCG tablet Take 1,000 mcg by mouth at bedtime.     Allergies:   Azithromycin, Ibuprofen, Levaquin [levofloxacin in d5w], Tetracycline, Isovue [iopamidol], Sulfonamide derivatives, Aleve [naproxen], Tape, Zosyn [piperacillin sod-tazobactam so],  Amlodipine besy-benazepril hcl, and Amoxicillin   Social History   Tobacco Use  . Smoking status: Never Smoker  . Smokeless tobacco: Never Used  Substance Use Topics  . Alcohol use: No    Alcohol/week: 0.0 standard drinks  . Drug use: No     Family Hx: The patient's family history includes Cancer in her father; Heart attack (age of onset: 36) in her mother; Heart failure in her mother; Hypertension in her sister; Hyperthyroidism in her sister; Lung cancer in her father; Stroke in her paternal grandmother. There is no history of Diabetes, Colon cancer, Esophageal cancer, Stomach cancer, or Rectal cancer.  ROS:   Please see the history of present illness.     All other systems reviewed and are negative.   Prior CV studies:   The following studies were reviewed today:  Echo 05/26/15: Study Conclusions  - Left ventricle: The cavity size was mildly dilated. Systolic function was normal. The estimated ejection fraction was in the range of 60% to 65%. Wall motion was normal; there were no regional wall motion abnormalities. Left ventricular diastolic function parameters were normal. - Aortic valve: Transvalvular velocity was within the normal range. There was no stenosis. There was no regurgitation. - Mitral valve: There was no regurgitation. - Left atrium: The atrium was mildly dilated. - Right ventricle: The cavity size was normal. Wall thickness was normal. Systolic function was normal. - Tricuspid valve: There was no regurgitation. - Inferior vena cava: The vessel was normal in size. The respirophasic diameter changes were in the normal range (= 50%), consistent with normal central venous pressure.   28 Day Event Monitor 04/01/15:  Quality: Fair.  Baseline artifact. Predominant rhythm: sinus rhythm Average heart rate: 70 bpm Max heart rate: 126 bpm Min heart rate: 50 bpm  Short runs of atrial tachycardia lasting up to 11 seconds.  One 6 beat run of  NSVT.   Lexiscan Cardiolite 03/28/15: 1. Nuclear stress EF: 71%. 2. The left ventricular ejection fraction is hyperdynamic (>65%). 3. There was no ST segment deviation noted during stress. 4. This is an intermediate risk study. 5. Findings consistent with ischemia.  Technically difficult study due to increased subdiaphragmatic activity; intermediate risk with small, severe, reversible defects in the apical and inferior basal walls consistent with ischemia; EF 71 with normal wall motion.  LHC 04/17/15: 6. The left ventricular systolic function is normal. 7. Angiographically normal coronary arteries,  but very tortuous   Angiographically no evidence of any significant lesions to explain the patient's abnormal stress test. There does appear to be mild calcification in the LAD distribution, but certainly not on the luminal aspect.  Labs/Other Tests and Data Reviewed:    EKG:  An ECG dated 03/22/18 was personally reviewed today and demonstrated:  sinus rhythm.  Rate 77 bpm.  Non-specific ST-T changes  Recent Labs: 03/22/2018: BUN 19; Creatinine, Ser 0.90; Hemoglobin 12.5; Platelets 299; Potassium 4.1; Sodium 137; TSH 2.838   Recent Lipid Panel Lab Results  Component Value Date/Time   CHOL 256 (H) 08/09/2014 09:28 AM   TRIG 180 (H) 08/09/2014 09:28 AM   HDL 74 08/09/2014 09:28 AM   LDLCALC 146 (H) 08/09/2014 09:28 AM    Wt Readings from Last 3 Encounters:  10/11/18 287 lb (130.2 kg)  03/22/18 291 lb 0.1 oz (132 kg)  03/10/18 293 lb (132.9 kg)     Objective:    Vital Signs:  BP (!) 134/59   Pulse 74   Ht 5\' 2"  (1.575 m)   Wt 287 lb (130.2 kg)   BMI 52.49 kg/m    VITAL SIGNS:  reviewed GEN:  no acute distress RESPIRATORY:  non-labored PSYCH:  normal affect  ASSESSMENT & PLAN:    # PAF: Noted postoperatively.  She has been experiencing more frequent and longer episodes.  We are avoiding nodal agents given her myasthenia gravis.  We will give her flecainide 150 mg to be  taken as needed if she has an episode of A. fib the last more than 30 minutes.  She will call us if the episode persists.  Continue Eliquis.   # Coronary calcification: # Abnormal stress: No significant CAD on cath. She denies chest pain. Continue aspirin. Avoid statins or nodal agents due to her history of myasthenia.  She will have lipids checked with her PCP soon.  # Hypertension: BP well-controlled on losartan, spironolactone, and furosemide.  # Paroxysmal atrial tachycardia, NSVT: 30 day event monitor showed runs of atrial tachycardia as well as one run of nonsustained ventricular tachycardia. Since her last appointment she had one episode.  No nodal agents 2/2 Myasthenia.  No ischmia or underlying structural heart disease.    COVID-19 Education: The signs and symptoms of COVID-19 were discussed with the patient and how to seek care for testing (follow up with PCP or arrange E-visit).  The importance of social distancing was discussed today.  Time:   Today, I have spent 22 minutes with the patient with telehealth technology discussing the above problems.     Medication Adjustments/Labs and Tests Ordered: Current medicines are reviewed at length with the patient today.  Concerns regarding medicines are outlined above.   Tests Ordered: No orders of the defined types were placed in this encounter.   Medication Changes: Meds ordered this encounter  Medications  . flecainide (TAMBOCOR) 150 MG tablet    Sig: Take 1 tablet (150 mg total) by mouth as needed (for AFIB).    Dispense:  10 tablet    Refill:  5    Follow Up:  Virtual Visit or In Person in 4 month(s)  Signed, Skeet Latch, MD  10/11/2018 10:08 AM    [place]

## 2018-10-14 ENCOUNTER — Other Ambulatory Visit (HOSPITAL_COMMUNITY): Payer: Self-pay | Admitting: Physician Assistant

## 2018-11-02 ENCOUNTER — Other Ambulatory Visit: Payer: Self-pay | Admitting: Rheumatology

## 2018-11-02 NOTE — Telephone Encounter (Signed)
Last Visit: 08/11/18 Next Visit: 03/03/18  Okay to refill per Dr. Estanislado Pandy

## 2018-11-08 ENCOUNTER — Other Ambulatory Visit: Payer: Self-pay | Admitting: Cardiovascular Disease

## 2018-11-08 ENCOUNTER — Other Ambulatory Visit: Payer: Self-pay | Admitting: Internal Medicine

## 2018-11-08 DIAGNOSIS — I10 Essential (primary) hypertension: Secondary | ICD-10-CM

## 2018-11-19 NOTE — Progress Notes (Signed)
Virtual Visit via telephone note  I connected with Kimberly [name] on 11/20/18 at  2:00 PM EST by telephone and verified that I am speaking with the correct person using two identifiers.   I discussed the limitations of evaluation and management by telemedicine and the availability of in person appointments. The patient expressed understanding and agreed to proceed.  Present for the visit:  Myself, Dr [name], Westley Hummer.  The patient is currently at home and I am in the office.    No referring provider.    History of Present Illness: She is here for follow up of her chronic medical conditions.   She is not exercising regularly.   She has been less active being cooped up at home.  She states some mild depression at times, but understands this is to be expected because she is at home more.  Overall she feels well except for joint pain and has no major concerns.  Afib, Hypertension: She is taking her medication daily. She is compliant with a low sodium diet.  She denies chest pain, palpitations, shortness of breath and regular headaches. She does monitor her blood pressure at home - 134/58 this morning.    Myasthenia gravis:  She following with Dr Posey Pronto.    B/l Leg edema:  She takes lasix daily.  She has had some swelling on the tops of her feet.  She wonders if this is from being less active.  Diarrhea:  Her diarrhea is better, but still has it.  It is more of a soft stool.  She has been avoiding the foods that cause it.   Vasculitis, diffuse joint pain: She is following with rheumatology.  She is on low-dose prednisone.    Review of Systems  Constitutional: Negative for fever.  Respiratory: Negative for cough, shortness of breath and wheezing.   Cardiovascular: Positive for leg swelling. Negative for chest pain and palpitations.  Gastrointestinal: Positive for diarrhea. Negative for abdominal pain and heartburn.  Musculoskeletal: Positive for myalgias.  Neurological:  Negative for dizziness and headaches.       Social History   Socioeconomic History  . Marital status: Married    Spouse name: Not on file  . Number of children: Not on file  . Years of education: Not on file  . Highest education level: Not on file  Occupational History  . 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 Financial resource strain: Not on file  . Food insecurity    Worry: Not on file    Inability: Not on file  . Transportation needs    Medical: Not on file    Non-medical: Not on file  Tobacco Use  . Smoking status: Never Smoker  . Smokeless tobacco: Never Used  Substance and Sexual Activity  . Alcohol use: No    Alcohol/week: 0.0 standard drinks  . Drug use: No  . Sexual activity: Not Currently  Lifestyle  . Physical activity    Days per week: Not on file    Minutes per session: Not on file  . Stress: Not on file  Relationships  . Social Herbalist on phone: Not on file    Gets together: Not on file    Attends religious service: Not on file    Active member of club or organization: Not on file    Attends meetings of clubs or organizations: Not on file    Relationship status: Not on file  Other Topics Concern  . Not on file  Social History Narrative   Lives with husband in a one story home.  Had one child that only lived for 14 hours.     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          Epworth Sleepiness Scale = 2 (as of 03/14/2015)     Observations/Objective: Blood pressure at home today 134/58 Pulse 68 Temperature 97.4 Oxygen saturation 97% Weight 289   Assessment and Plan:  See Problem List for Assessment and Plan of chronic medical problems.   Follow Up Instructions:    I discussed the assessment and treatment plan with the patient. The patient was provided an opportunity to ask questions and all were answered. The patient agreed with the plan and demonstrated  an understanding of the instructions.   The patient was advised to call back or seek an in-person evaluation if the symptoms worsen or if the condition fails to improve as anticipated.  Time spent on telephone: 12 minutes  [name], MD

## 2018-11-20 ENCOUNTER — Encounter: Payer: Self-pay | Admitting: Internal Medicine

## 2018-11-20 ENCOUNTER — Ambulatory Visit (INDEPENDENT_AMBULATORY_CARE_PROVIDER_SITE_OTHER): Payer: Medicare Other | Admitting: Internal Medicine

## 2018-11-20 DIAGNOSIS — I10 Essential (primary) hypertension: Secondary | ICD-10-CM

## 2018-11-20 DIAGNOSIS — I480 Paroxysmal atrial fibrillation: Secondary | ICD-10-CM | POA: Diagnosis not present

## 2018-11-20 DIAGNOSIS — R197 Diarrhea, unspecified: Secondary | ICD-10-CM

## 2018-11-20 DIAGNOSIS — M310 Hypersensitivity angiitis: Secondary | ICD-10-CM

## 2018-11-20 DIAGNOSIS — R600 Localized edema: Secondary | ICD-10-CM

## 2018-11-20 DIAGNOSIS — G7000 Myasthenia gravis without (acute) exacerbation: Secondary | ICD-10-CM | POA: Diagnosis not present

## 2018-11-20 NOTE — Assessment & Plan Note (Signed)
Following with Dr. Posey Pronto

## 2018-11-20 NOTE — Assessment & Plan Note (Signed)
Continues to have chronic diarrhea/soft stools Related to several foods, which she does avoid

## 2018-11-20 NOTE — Assessment & Plan Note (Signed)
Following with rheumatology On low-dose prednisone 5 mg daily

## 2018-11-20 NOTE — Assessment & Plan Note (Signed)
Following with cardiology Started on flecainide PRN only - has not needed to use it No palpitations in past 6 weeks or so On eliquis

## 2018-11-20 NOTE — Assessment & Plan Note (Signed)
Has had some occasional swelling-likely from being less active Taking Lasix daily Continue

## 2018-11-20 NOTE — Assessment & Plan Note (Signed)
Blood pressure well controlled at home Continue current medications

## 2019-01-11 ENCOUNTER — Other Ambulatory Visit: Payer: Self-pay | Admitting: Physician Assistant

## 2019-01-11 NOTE — Telephone Encounter (Signed)
Last Visit: 08/11/2018 telemedicine  Next Visit: 02/13/2019  Okay to refill per Dr. Estanislado Pandy.

## 2019-01-31 ENCOUNTER — Other Ambulatory Visit: Payer: Self-pay | Admitting: Internal Medicine

## 2019-01-31 ENCOUNTER — Other Ambulatory Visit: Payer: Self-pay | Admitting: Cardiovascular Disease

## 2019-01-31 DIAGNOSIS — I10 Essential (primary) hypertension: Secondary | ICD-10-CM

## 2019-02-01 ENCOUNTER — Other Ambulatory Visit: Payer: Self-pay | Admitting: Neurology

## 2019-02-01 ENCOUNTER — Other Ambulatory Visit (HOSPITAL_COMMUNITY): Payer: Self-pay | Admitting: Physician Assistant

## 2019-02-05 NOTE — Progress Notes (Deleted)
Virtual Visit via Telephone Note  I connected with Kimberly [name] on 02/05/19 at 10:15 AM EST by telephone and verified that I am speaking with the correct person using two identifiers.  Location: Patient: Home Provider: Clinic  This service was conducted via virtual visit.  The patient was located at home. I was located in my office.  Consent was obtained prior to the virtual visit and is aware of possible charges through their insurance for this visit.  The patient is an established patient.  Dr. Estanislado Pandy, MD conducted the virtual visit and [name], PA-C acted as scribe during the service.  Office staff helped with scheduling follow up visits after the service was conducted.     I discussed the limitations, risks, security and privacy concerns of performing an evaluation and management service by telephone and the availability of in person appointments. I also discussed with the patient that there may be a patient responsible charge related to this service. The patient expressed understanding and agreed to proceed.  CC: History of Present Illness: Patient is a 74 year old female with a past medical history of   Review of Systems  Constitutional: Negative for fever and malaise/fatigue.  Eyes: Negative for photophobia, pain, discharge and redness.  Respiratory: Negative for cough, shortness of breath and wheezing.   Cardiovascular: Negative for chest pain and palpitations.  Gastrointestinal: Negative for blood in stool, constipation and diarrhea.  Genitourinary: Negative for dysuria.  Musculoskeletal: Negative for back pain, joint pain, myalgias and neck pain.  Skin: Negative for rash.  Neurological: Negative for dizziness and headaches.  Psychiatric/Behavioral: Negative for depression. The patient is not nervous/anxious and does not have insomnia.       Observations/Objective: Physical Exam  Constitutional: She is oriented to person, place, and time.  Neurological: She is  alert and oriented to person, place, and time.  Psychiatric: Mood, memory, affect and judgment normal.   Patient reports morning stiffness for *** {minute/hour:19697}.   Patient {Actions; denies-reports:120008} nocturnal pain.  Difficulty dressing/grooming: {ACTIONS;DENIES/REPORTS:21021675::"Denies"} Difficulty climbing stairs: {ACTIONS;DENIES/REPORTS:21021675::"Denies"} Difficulty getting out of chair: {ACTIONS;DENIES/REPORTS:21021675::"Denies"} Difficulty using hands for taps, buttons, cutlery, and/or writing: {ACTIONS;DENIES/REPORTS:21021675::"Denies"}   Assessment and Plan: Visit Diagnoses:Leukocytoclastic vasculitis ([place])- Bx proven, lower extremity involvement: She has not have had recent flares or recurrences She has been off of MTX since September 2018 due to recurrent infections.  She continues to take prednisone 5 mg po daily.    High risk medication use -She takes Prednsione 5 mg qd.  A refill of prednisone was sent today. She has beenoff MTX sinceSeptember2018 due tohaving acolovesicular fistula. She had surgery for correction in March 2019.  She has lab monitoring with her PCP.  DDD (degenerative disc disease), cervical:   Primary osteoarthritis of both knees:   H/O repair of rotator cuff: Right: Doing well.   Psoriasis -Managed byDr. Jarome Matin.  Osteopenia of multiple sites - Followed by PCP.She takes calcium and vitamin D on a regular basis.   Trochanteric bursitis, right hip:   Follow Up Instructions: She will follow up in    I discussed the assessment and treatment plan with the patient. The patient was provided an opportunity to ask questions and all were answered. The patient agreed with the plan and demonstrated an understanding of the instructions.   The patient was advised to call back or seek an in-person evaluation if the symptoms worsen or if the condition fails to improve as anticipated.  I provided *** minutes of non-face-to-face time  during  this encounter.   Ofilia Neas, PA-C

## 2019-02-06 ENCOUNTER — Telehealth (INDEPENDENT_AMBULATORY_CARE_PROVIDER_SITE_OTHER): Payer: Medicare Other | Admitting: Cardiovascular Disease

## 2019-02-06 ENCOUNTER — Encounter: Payer: Self-pay | Admitting: Cardiovascular Disease

## 2019-02-06 VITALS — BP 127/74 | HR 76 | Ht 62.0 in | Wt 289.0 lb

## 2019-02-06 DIAGNOSIS — I10 Essential (primary) hypertension: Secondary | ICD-10-CM

## 2019-02-06 DIAGNOSIS — I480 Paroxysmal atrial fibrillation: Secondary | ICD-10-CM | POA: Diagnosis not present

## 2019-02-06 NOTE — Patient Instructions (Signed)
Medication Instructions:  Continue current medications  *If you need a refill on your cardiac medications before your next appointment, please call your pharmacy*  Lab Work: None ordered  Testing/Procedures: None Ordered  Follow-Up: At [facility], you and your health needs are our priority.  As part of our continuing mission to provide you with exceptional heart care, we have created designated Provider Care Teams.  These Care Teams include your primary Cardiologist (physician) and Advanced Practice Providers (APPs -  Physician Assistants and Nurse Practitioners) who all work together to provide you with the care you need, when you need it.  Your next appointment:   6 month(s)  The format for your next appointment:   In Person  Provider:   Skeet Latch, MD

## 2019-02-06 NOTE — Progress Notes (Signed)
Virtual Visit via Telephone Note   This visit type was conducted due to national recommendations for restrictions regarding the COVID-19 Pandemic (e.g. social distancing) in an effort to limit this patient's exposure and mitigate transmission in our community.  Due to her co-morbid illnesses, this patient is at least at moderate risk for complications without adequate follow up.  This format is felt to be most appropriate for this patient at this time.  The patient did not have access to video technology/had technical difficulties with video requiring transitioning to audio format only (telephone).  All issues noted in this document were discussed and addressed.  No physical exam could be performed with this format.  Please refer to the patient's chart for her  consent to telehealth for [facility].   Date:  02/06/2019   ID:  Kimberly [name], DOB 1945-03-25, MRN [id number]  Patient Location: Home Provider Location: Office  PCP:  [name], MD  Cardiologist:  Skeet Latch, MD  Electrophysiologist:  None   Evaluation Performed:  Follow-Up Visit  Chief Complaint:  Atrial fibrillation  History of Present Illness:    Kimberly [name] is a 74 y.o. female with paroxysmal atrial fibrillation, hypertension, coronary artery calcification, myasthenia gravis, R LE lymphedema, recurrent R pleural effusion, hyperlipidemia who presents for follow up. She was initially seen 03/2015 for evaluation of asymptomatic coronary calcifications.  She had a chest CT to follow up on pleural effusions and was noted to have coronary calcification. She was asymptomatic but did not exercise much due to myasthenia.  She had a [employer] 03/2015 that was concerning for inferior and apical ischemia.  She subsequently underwent left heart catheterization 04/2015 that showed normal, but tortuous coronary arteries.  She reported palpitations and had a 30 day event monitor that showed paroxysmal atrial  tachycardia and NSVT. She was not started on any nodal agents due to her history of myasthenia.  She had an echo 05/2015 that revealed LVEF 60-65% with normal diastolic function.  [name] was admitted 03/2017 for repair of a colovesicular fistula.  On postoperative day 6 she developed atrial fibrillation with rapid ventricular response.  She received 1 dose of IV metoprolol but was switched to amiodarone given her myasthenia.  She converted back to sinus rhythm on amiodarone and was started on Eliquis.  She followed up with Almyra Deforest, PA, 03/2017 and reported dizziness and nausea that was thought to be due to the amiodarone.  Given that her atrial for relation was thought to be postoperative amiodarone was decreased with plans to discontinue it.  She did not have any recurrent atrial fibrillation after that time.  She again wore a 30-day event monitor that was without atrial fibrillation. She had an episode of afib 03/2018 and was seen in the atrial fibrillation clinic.  She was restarted on Eliquis.  At her last appointment she had several episodes.  She was started on flecainide to be taken as needed.  However she has not needed to use it.  She had one episode of atrial fibrillation that lasted for couple hours.  Overall she has been feeling well.  She notes that there are some days when she wakes up feeling extremely fatigued.  It seems to improve throughout the day.  She wonders if this could have been the time when she had episodes of atrial fibrillation overnight.  She denies snoring and her husband has not witnessed any apneic episodes.  Otherwise she has felt well.  She continues to  have some mild edema in her right foot that is stable.  She denies orthopnea or PND.  She is not getting much exercise.  She does try to do some walking around the house and has no exertional chest pain or shortness of breath.  The patient does not have symptoms concerning for COVID-19 infection (fever, chills, cough, or new  shortness of breath).    Past Medical History:  Diagnosis Date  . Allergy   . Aortic atherosclerosis ([place])   . Atrial tachycardia ([place]) 05/12/2015  . Cataract   . Colovesical fistula   . Diverticulitis   . Diverticulosis   . DJD (degenerative joint disease), cervical   . Frequent UTI   . GERD (gastroesophageal reflux disease)   . History of hiatal hernia    Small hernia  . Hypertension   . Internal hemorrhoids 01/21/2017   noted on colonopscopy  . Leukocytoclastic vasculitis ([place])   . Myalgia and myositis, unspecified   . Myasthenia gravis without exacerbation ([facility])    [facility], Dr Anna Genre  . NSVT (nonsustained ventricular tachycardia) ([place]) 05/12/2015  . Osteoarthritis    Both knees  . Osteopenia   . Other and unspecified hyperlipidemia   . Periumbilical hernia   . Pneumonia 04/2016  . PONV (postoperative nausea and vomiting)   . Psoriasis    plantar ;Dr Jarome Matin  . Sleep difficulties   . Vasculitis ([place])    Dr Estanislado Pandy  . Vitamin D deficiency    Past Surgical History:  Procedure Laterality Date  . APPENDECTOMY     & exploratory for inflammation; Dr Hassell Done  . CARDIAC CATHETERIZATION N/A 04/17/2015   Procedure: Left Heart Cath and Coronary Angiography;  Surgeon: Leonie Man, MD;  Location: [facility];  Service: Cardiovascular;  Laterality: N/A;  . CERVICAL FUSION     C5-6; Dr Vertell Limber  . CHOLECYSTECTOMY     for stones  . COLONOSCOPY W/ POLYPECTOMY  01/21/2017  . CYSTOSCOPY WITH STENT PLACEMENT Bilateral 03/18/2017   Procedure: CYSTOSCOPY WITH RETROGRADE AND BILATERAL URETERAL STENT PLACEMENT;  Surgeon: Lucas Mallow, MD;  Location: [facility];  Service: Urology;  Laterality: Bilateral;  . HERNIA REPAIR    . IR RADIOLOGIST EVAL & MGMT  11/16/2016  . IR RADIOLOGIST EVAL & MGMT  12/08/2016  . IR RADIOLOGIST EVAL & MGMT  12/16/2016  . LAMINECTOMY  2010   T1-2; Dr Vertell Limber  . PARTIAL COLECTOMY N/A 03/18/2017   Procedure: OPEN SIGMOIDECTOMY;  Surgeon: Leighton Ruff,  MD;  Location: [facility];  Service: General;  Laterality: N/A;  . SHOULDER SURGERY     Left & Right  . THORACENTESIS     for peripneumonic effusion  . TOTAL ABDOMINAL HYSTERECTOMY W/ BILATERAL SALPINGOOPHORECTOMY     For Fibroids & endometriosis  . TOTAL SHOULDER ARTHROPLASTY    . UMBILICAL HERNIA REPAIR N/A 03/18/2017   Procedure: OPEN HERNIA REPAIR UMBILICAL ADULT;  Surgeon: Leighton Ruff, MD;  Location: [facility];  Service: General;  Laterality: N/A;  ERAs pathway     Current Meds  Medication Sig  . apixaban (ELIQUIS) 5 MG TABS tablet Take 1 tablet (5 mg total) by mouth 2 (two) times daily. Appointment Required For Further Refills [phone]  . CALCIUM PO Take 1,000 mg by mouth at bedtime.  . cholecalciferol (VITAMIN D) 1000 units tablet Take 1,000 Units by mouth at bedtime.  . flecainide (TAMBOCOR) 150 MG tablet Take 1 tablet (150 mg total) by mouth as needed (for AFIB).  . furosemide (  LASIX) 40 MG tablet Take 0.5 tablets (20 mg total) by mouth daily. Annual appt w/labs due in May must see provider for future refills  . losartan (COZAAR) 100 MG tablet Take 1 tablet (100 mg total) by mouth daily. Annual appt w/labs due in May must see provider for future refills  . Melatonin 10 MG CAPS Take by mouth.  . Multiple Vitamin (MULTIVITAMIN WITH MINERALS) TABS tablet Take 1 tablet by mouth at bedtime.  . predniSONE (DELTASONE) 5 MG tablet TAKE 1 TABLET BY MOUTH EVERY DAY WITH BREAKFAST  . pyridostigmine (MESTINON) 60 MG tablet TAKE 1 TABLET BY MOUTH 3 TIMES A DAY  . pyridOXINE (VITAMIN B-6) 100 MG tablet Take 100 mg by mouth at bedtime.  Marland Kitchen spironolactone (ALDACTONE) 25 MG tablet TAKE 1 TABLET BY MOUTH EVERY DAY  . vitamin B-12 (CYANOCOBALAMIN) 1000 MCG tablet Take 1,000 mcg by mouth at bedtime.     Allergies:   Azithromycin, Ibuprofen, Levaquin [levofloxacin in d5w], Tetracycline, Isovue [iopamidol], Sulfonamide derivatives, Aleve [naproxen], Tape, Zosyn [piperacillin sod-tazobactam so],  Amlodipine besy-benazepril hcl, and Amoxicillin   Social History   Tobacco Use  . Smoking status: Never Smoker  . Smokeless tobacco: Never Used  Substance Use Topics  . Alcohol use: No    Alcohol/week: 0.0 standard drinks  . Drug use: No     Family Hx: The patient's family history includes Cancer in her father; Heart attack (age of onset: 29) in her mother; Heart failure in her mother; Hypertension in her sister; Hyperthyroidism in her sister; Lung cancer in her father; Stroke in her paternal grandmother. There is no history of Diabetes, Colon cancer, Esophageal cancer, Stomach cancer, or Rectal cancer.  ROS:   Please see the history of present illness.     All other systems reviewed and are negative.   Prior CV studies:   The following studies were reviewed today:  Echo 05/26/15: Study Conclusions  - Left ventricle: The cavity size was mildly dilated. Systolic function was normal. The estimated ejection fraction was in the range of 60% to 65%. Wall motion was normal; there were no regional wall motion abnormalities. Left ventricular diastolic function parameters were normal. - Aortic valve: Transvalvular velocity was within the normal range. There was no stenosis. There was no regurgitation. - Mitral valve: There was no regurgitation. - Left atrium: The atrium was mildly dilated. - Right ventricle: The cavity size was normal. Wall thickness was normal. Systolic function was normal. - Tricuspid valve: There was no regurgitation. - Inferior vena cava: The vessel was normal in size. The respirophasic diameter changes were in the normal range (= 50%), consistent with normal central venous pressure.   28 Day Event Monitor 04/01/15:  Quality: Fair.  Baseline artifact. Predominant rhythm: sinus rhythm Average heart rate: 70 bpm Max heart rate: 126 bpm Min heart rate: 50 bpm  Short runs of atrial tachycardia lasting up to 11 seconds.  One 6 beat run of  NSVT.   Lexiscan Cardiolite 03/28/15: 1. Nuclear stress EF: 71%. 2. The left ventricular ejection fraction is hyperdynamic (>65%). 3. There was no ST segment deviation noted during stress. 4. This is an intermediate risk study. 5. Findings consistent with ischemia.  Technically difficult study due to increased subdiaphragmatic activity; intermediate risk with small, severe, reversible defects in the apical and inferior basal walls consistent with ischemia; EF 71 with normal wall motion.  LHC 04/17/15: 6. The left ventricular systolic function is normal. 7. Angiographically normal coronary arteries, but very tortuous  Angiographically no evidence of any significant lesions to explain the patient's abnormal stress test. There does appear to be mild calcification in the LAD distribution, but certainly not on the luminal aspect.  Labs/Other Tests and Data Reviewed:    EKG:  An ECG dated 03/22/18 was personally reviewed today and demonstrated:  sinus rhythm.  Rate 77 bpm.  Non-specific ST-T changes  Recent Labs: 03/22/2018: BUN 19; Creatinine, Ser 0.90; Hemoglobin 12.5; Platelets 299; Potassium 4.1; Sodium 137; TSH 2.838   Recent Lipid Panel Lab Results  Component Value Date/Time   CHOL 256 (H) 08/09/2014 09:28 AM   TRIG 180 (H) 08/09/2014 09:28 AM   HDL 74 08/09/2014 09:28 AM   LDLCALC 146 (H) 08/09/2014 09:28 AM    Wt Readings from Last 3 Encounters:  02/06/19 289 lb (131.1 kg)  10/11/18 287 lb (130.2 kg)  03/22/18 291 lb 0.1 oz (132 kg)     Objective:    Vital Signs:  BP 127/74   Pulse 76   Ht 5\' 2"  (1.575 m)   Wt 289 lb (131.1 kg)   SpO2 97%   BMI 52.86 kg/m    VITAL SIGNS:  reviewed GEN:  no acute distress RESPIRATORY:  non-labored PSYCH:  normal affect  ASSESSMENT & PLAN:    # PAF: Noted postoperatively.  She only had one episode since her last appointment and has not needed to take flecainide.  She has it on hand as needed.  It is unclear whether the  episodes of fatigue she has are related to atrial fibrillation overnight.  I suspect that if she were having A. fib it would occur more regularly.  And throughout the day as well.  She has been experiencing more frequent and longer episodes.  We are avoiding nodal agents given her Myasthenia Gravis.  We will give her flecainide 150 mg to be taken as needed if she has an episode of A. fib the last more than 30 minutes.  She will call us if the episode persists.  Continue Eliquis.   # Coronary calcification: # Abnormal stress: No significant CAD on cath. She denies chest pain. Continue aspirin. Avoid statins or nodal agents due to her history of Myasthenia.  She will have lipids checked with her PCP soon.  # Hypertension: BP well-controlled on losartan, spironolactone, and furosemide.  # Paroxysmal atrial tachycardia, NSVT: 30 day event monitor showed runs of atrial tachycardia as well as one run of nonsustained ventricular tachycardia. Since her last appointment she had one episode.  No nodal agents 2/2 Myasthenia.  No ischmia or underlying structural heart disease.    COVID-19 Education: The signs and symptoms of COVID-19 were discussed with the patient and how to seek care for testing (follow up with PCP or arrange E-visit).  The importance of social distancing was discussed today.  Time:   Today, I have spent 17 minutes with the patient with telehealth technology discussing the above problems.     Medication Adjustments/Labs and Tests Ordered: Current medicines are reviewed at length with the patient today.  Concerns regarding medicines are outlined above.   Tests Ordered: No orders of the defined types were placed in this encounter.   Medication Changes: No orders of the defined types were placed in this encounter.   Follow Up:  Virtual Visit or In Person in 6 month(s)  Signed, Skeet Latch, MD  02/06/2019 1:46 PM    [facility]

## 2019-02-08 ENCOUNTER — Telehealth (INDEPENDENT_AMBULATORY_CARE_PROVIDER_SITE_OTHER): Payer: Medicare Other | Admitting: Rheumatology

## 2019-02-08 ENCOUNTER — Other Ambulatory Visit: Payer: Self-pay

## 2019-02-08 ENCOUNTER — Encounter: Payer: Self-pay | Admitting: Rheumatology

## 2019-02-08 DIAGNOSIS — Z8679 Personal history of other diseases of the circulatory system: Secondary | ICD-10-CM | POA: Diagnosis not present

## 2019-02-08 DIAGNOSIS — L409 Psoriasis, unspecified: Secondary | ICD-10-CM

## 2019-02-08 DIAGNOSIS — M8589 Other specified disorders of bone density and structure, multiple sites: Secondary | ICD-10-CM | POA: Diagnosis not present

## 2019-02-08 DIAGNOSIS — Z8719 Personal history of other diseases of the digestive system: Secondary | ICD-10-CM | POA: Diagnosis not present

## 2019-02-08 DIAGNOSIS — M170 Bilateral primary osteoarthritis of knee: Secondary | ICD-10-CM

## 2019-02-08 DIAGNOSIS — Z8639 Personal history of other endocrine, nutritional and metabolic disease: Secondary | ICD-10-CM

## 2019-02-08 DIAGNOSIS — M5030 Other cervical disc degeneration, unspecified cervical region: Secondary | ICD-10-CM

## 2019-02-08 DIAGNOSIS — Z79899 Other long term (current) drug therapy: Secondary | ICD-10-CM | POA: Diagnosis not present

## 2019-02-08 DIAGNOSIS — M7061 Trochanteric bursitis, right hip: Secondary | ICD-10-CM

## 2019-02-08 DIAGNOSIS — G7000 Myasthenia gravis without (acute) exacerbation: Secondary | ICD-10-CM | POA: Diagnosis not present

## 2019-02-08 DIAGNOSIS — M310 Hypersensitivity angiitis: Secondary | ICD-10-CM

## 2019-02-08 DIAGNOSIS — Z9889 Other specified postprocedural states: Secondary | ICD-10-CM | POA: Diagnosis not present

## 2019-02-08 NOTE — Progress Notes (Signed)
Virtual Visit via Telephone Note  I connected with Kimberly [name] on 02/08/19 at 11:45 AM EST by telephone and verified that I am speaking with the correct person using two identifiers.  Location: Patient: Home Provider: Clinic  This service was conducted via virtual visit.    The patient was located at home. I was located in my office.  Consent was obtained prior to the virtual visit and is aware of possible charges through their insurance for this visit.  The patient is an established patient.  Dr. Estanislado Pandy, MD conducted the virtual visit and [name], PA-C acted as scribe during the service.  Office staff helped with scheduling follow up visits after the service was conducted.     I discussed the limitations, risks, security and privacy concerns of performing an evaluation and management service by telephone and the availability of in person appointments. I also discussed with the patient that there may be a patient responsible charge related to this service. The patient expressed understanding and agreed to proceed.  CC: Arthralgias  History of Present Illness: Patient is a 74 year old female with a past medical history of leukocytoclastic vasculitis, osteoarthritis, and DDD.  She is taking prednisone 5 mg po daily.  She has not had any recent rashes or recurrences of vasculitis. She states she has been having increased arthralgias recently.  She denies any joint swelling. She states she has good days and bad days and it seems to be unpredictable. She states the right trochanteric bursitis pain has improved. She decided against scheduling a cortisone injection with Dr. Ernestina Patches.  She denies any psoriasis at this time.    Review of Systems  Constitutional: Positive for malaise/fatigue. Negative for fever.  Eyes: Negative for photophobia, pain, discharge and redness.  Respiratory: Negative for cough, shortness of breath and wheezing.   Cardiovascular: Negative for chest pain and  palpitations.  Gastrointestinal: Negative for blood in stool, constipation and diarrhea.  Genitourinary: Negative for dysuria.  Musculoskeletal: Positive for back pain, joint pain and neck pain. Negative for myalgias.       +Morning stiffness   Skin: Negative for rash.  Neurological: Negative for dizziness and headaches.  Psychiatric/Behavioral: Negative for depression. The patient has insomnia. The patient is not nervous/anxious.       Observations/Objective: Physical Exam  Constitutional: She is oriented to person, place, and time.  Neurological: She is alert and oriented to person, place, and time.  Psychiatric: Mood, memory, affect and judgment normal.   Patient reports morning stiffness for 30   minutes.   Patient reports nocturnal pain.  Difficulty dressing/grooming: Denies Difficulty climbing stairs: Reports Difficulty getting out of chair: Reports Difficulty using hands for taps, buttons, cutlery, and/or writing: Reports   Assessment and Plan: Visit Diagnoses:Leukocytoclastic vasculitis ([place])- Bx proven, lower extremity involvement: She has not had any signs or symptoms of a flare.  No recent recurrences. She is clinically doing well on Prednisone 5 mg po daily.  She has been off of MTX since September 2018 after developing a colovesicular fistula.  She has not had any recent infections.  She does not require immunosuppression at this time. She was advised to notify us if she develops any new or worsening symptoms.  She will follow up in 6 months.  High risk medication use -She takes Prednsione 5 mg qd. She has beenoff MTX sinceSeptember2018 due tohaving acolovesicular fistula. She had surgical correction in March 2019.  She has lab work drawn at Dr. Quay Burow office.  She is overdue for routine lab work.   DDD (degenerative disc disease), cervical: She has intermittent discomfort in her neck.  She has no symptoms of radiculopathy at this time.  Primary osteoarthritis of  both knees: She experiences nocturnal pain in both knee joints. She has difficulty climbing steps and getting up from a chair.    H/O repair of rotator cuff: Right: She has intermittent discomfort in the right shoulder joint. She experiences nocturnal pain.   Psoriasis -Managed byDr. Jarome Matin. She has no active psoriasis at this time.   Osteopenia of multiple sites - Followed by PCP.She takes calcium and vitamin D on a regular basis. She was advised to notify us when she is ready to schedule an updated DEXA.   Trochanteric bursitis, right hip: She declined PT in the past. She was referred to Dr. Ernestina Patches for a cortisone injection, but she decided against scheduling an injection.  Her discomfort has improved.   Follow Up Instructions: She will follow up in 6 months.   I discussed the assessment and treatment plan with the patient. The patient was provided an opportunity to ask questions and all were answered. The patient agreed with the plan and demonstrated an understanding of the instructions.   The patient was advised to call back or seek an in-person evaluation if the symptoms worsen or if the condition fails to improve as anticipated.  I provided 15 minutes of non-face-to-face time during this encounter.   Bo Merino, MD  Scribed by-  [name], PA-C

## 2019-02-09 ENCOUNTER — Telehealth: Payer: Medicare Other | Admitting: Rheumatology

## 2019-02-13 ENCOUNTER — Ambulatory Visit: Payer: Medicare Other | Admitting: Rheumatology

## 2019-02-24 ENCOUNTER — Other Ambulatory Visit (HOSPITAL_COMMUNITY): Payer: Self-pay | Admitting: Physician Assistant

## 2019-03-18 ENCOUNTER — Other Ambulatory Visit: Payer: Self-pay | Admitting: Rheumatology

## 2019-03-19 NOTE — Telephone Encounter (Signed)
Last Visit: 02/08/19 Next Visit: 07/12/19  Okay to refill per Dr. Estanislado Pandy

## 2019-03-21 ENCOUNTER — Other Ambulatory Visit (HOSPITAL_COMMUNITY): Payer: Self-pay | Admitting: Physician Assistant

## 2019-04-14 ENCOUNTER — Other Ambulatory Visit (HOSPITAL_COMMUNITY): Payer: Self-pay | Admitting: Cardiovascular Disease

## 2019-04-25 ENCOUNTER — Other Ambulatory Visit: Payer: Self-pay | Admitting: Internal Medicine

## 2019-04-25 ENCOUNTER — Other Ambulatory Visit: Payer: Self-pay | Admitting: Cardiovascular Disease

## 2019-04-25 DIAGNOSIS — I10 Essential (primary) hypertension: Secondary | ICD-10-CM

## 2019-04-27 ENCOUNTER — Telehealth: Payer: Self-pay | Admitting: Internal Medicine

## 2019-04-27 DIAGNOSIS — I2510 Atherosclerotic heart disease of native coronary artery without angina pectoris: Secondary | ICD-10-CM

## 2019-04-27 NOTE — Chronic Care Management (AMB) (Signed)
  Chronic Care Management   Note  04/27/2019 Name: ANTIANA VAUSE MRN: [id number] DOB: 1945/04/20  ELYA DAINTY is a 74 y.o. year old female who is a primary care patient of Burns, Claudina Lick, MD. I reached out to Dorisann Frames by phone today in response to a referral sent by Ms. Olevia Bowens Cratty's PCP, [name], MD.   Ms. Sutliff was given information about Chronic Care Management services today including:  1. CCM service includes personalized support from designated clinical staff supervised by her physician, including individualized plan of care and coordination with other care providers 2. 24/7 contact phone numbers for assistance for urgent and routine care needs. 3. Service will only be billed when office clinical staff spend 20 minutes or more in a month to coordinate care. 4. Only one practitioner may furnish and bill the service in a calendar month. 5. The patient may stop CCM services at any time (effective at the end of the month) by phone call to the office staff.   Patient agreed to services and verbal consent obtained.   Follow up plan:   Raynicia Dukes [employer] Scheduler

## 2019-05-21 NOTE — Addendum Note (Signed)
Addended by: CAIRRIKIER DAVIDSON, Jeana Kersting M on: 05/21/2019 08:04 AM   Modules accepted: Orders  

## 2019-05-22 ENCOUNTER — Telehealth: Payer: Medicare Other

## 2019-05-27 ENCOUNTER — Other Ambulatory Visit: Payer: Self-pay | Admitting: Rheumatology

## 2019-05-28 ENCOUNTER — Ambulatory Visit: Payer: Medicare Other | Admitting: Pharmacist

## 2019-05-28 ENCOUNTER — Other Ambulatory Visit: Payer: Self-pay

## 2019-05-28 DIAGNOSIS — E785 Hyperlipidemia, unspecified: Secondary | ICD-10-CM

## 2019-05-28 DIAGNOSIS — I480 Paroxysmal atrial fibrillation: Secondary | ICD-10-CM

## 2019-05-28 DIAGNOSIS — I10 Essential (primary) hypertension: Secondary | ICD-10-CM

## 2019-05-28 NOTE — Telephone Encounter (Signed)
Last Visit: 02/08/19 Next Visit: 07/12/19  Current Dose per office note on 02/08/2019: Prednsione 5 mg qd  Okay to refill per Dr. Estanislado Pandy

## 2019-05-28 NOTE — Patient Instructions (Addendum)
Visit Information  Thank you for meeting with me to discuss your medications! I look forward to working with you to achieve your health care goals. Below is a summary of what we talked about during the visit:  Goals Addressed            This Visit's Progress   . Pharmacy Care Plan       CARE PLAN ENTRY  Current Barriers:  . Chronic Disease Management support, education, and care coordination needs related to Hypertension, Hyperlipidemia, and Atrial Fibrillation   Hypertension . Pharmacist Clinical Goal(s): o Over the next 150 days, patient will work with PharmD and providers to maintain BP goal <140/90 . Current regimen:  o Losartan 100 mg daily o Spironolactone 25 mg daily o Furosemide 40 mg - 1/2 tab daily . Interventions: o Discussed BP goal and benefits of medications as well as diet . Patient self care activities - Over the next 150 days, patient will: o Check BP daily, document, and provide at future appointments o Ensure daily salt intake < 2300 mg/day  Hyperlipidemia Lipid Panel     Component Value Date/Time   CHOL 256 (H) 08/09/2014 0928   TRIG 180 (H) 08/09/2014 0928   HDL 74 08/09/2014 0928   LDLCALC 146 (H) 08/09/2014 0928 .  Pharmacist Clinical Goal(s): o Over the next 150 days, patient will work with PharmD and providers to achieve LDL goal < 100 . Current regimen:  o No medications (avoid statin with myasthenia gravis) . Interventions: o Discussed need for repeat cholesterol labs to determine goals and treatment options . Patient self care activities - Over the next 150 days, patient will: o Follow up with PCP and obtain repeat cholesterol panel o Continue low cholesterol diet  Atrial Fibrillation . Pharmacist Clinical Goal(s) o Over the next 150 days, patient will work with PharmD and providers to optimize anticoagulation regimen . Current regimen:  o Flecainide 150 mg as needed o Eliquis 5 mg BID . Interventions: o Discussed benefits and side  effects of Eliquis o Discussed cost issues with Eliquis - may be cheaper at preferred pharmacy . Patient self care activities - Over the next 150 days, patient will: o May consider switching Eliquis to [employer] (preferred pharmacy) for cost savings  Medication management . Pharmacist Clinical Goal(s): o Over the next 150 days, patient will work with PharmD and providers to maintain optimal medication adherence . [facility] . Interventions o Comprehensive medication review performed. o Continue current medication management strategy . Patient self care activities - Over the next 150 days, patient will: o Focus on medication adherence by pill box o Take medications as prescribed o Report any questions or concerns to PharmD and/or provider(s)  Initial goal documentation        Kimberly [name] was given information about Chronic Care Management services today including:  1. CCM service includes personalized support from designated clinical staff supervised by her physician, including individualized plan of care and coordination with other care providers 2. 24/7 contact phone numbers for assistance for urgent and routine care needs. 3. Standard insurance, coinsurance, copays and deductibles apply for chronic care management only during months in which we provide at least 20 minutes of these services. Most insurances cover these services at 100%, however patients may be responsible for any copay, coinsurance and/or deductible if applicable. This service may help you avoid the need for more expensive face-to-face services. 4. Only one practitioner may furnish and bill the service in a calendar  month. 5. The patient may stop CCM services at any time (effective at the end of the month) by phone call to the office staff.  Patient agreed to services and verbal consent obtained.   The patient verbalized understanding of instructions provided today and declined a print copy of patient  instruction materials.  Telephone follow up appointment with pharmacy team member scheduled for: 5 months  Charlene Brooke, PharmD Clinical Pharmacist [facility] [phone]  Cholesterol Content in Foods Cholesterol is a waxy, fat-like substance that helps to carry fat in the blood. The body needs cholesterol in small amounts, but too much cholesterol can cause damage to the arteries and heart. Most people should eat less than 200 milligrams (mg) of cholesterol a day. Foods with cholesterol  Cholesterol is found in animal-based foods, such as meat, seafood, and dairy. Generally, low-fat dairy and lean meats have less cholesterol than full-fat dairy and fatty meats. The milligrams of cholesterol per serving (mg per serving) of common cholesterol-containing foods are listed below. Meat and other proteins  Egg -- one large whole egg has 186 mg.  Veal shank -- 4 oz has 141 mg.  Lean ground [place] (93% lean) -- 4 oz has 118 mg.  Fat-trimmed lamb loin -- 4 oz has 106 mg.  Lean ground beef (90% lean) -- 4 oz has 100 mg.  Lobster -- 3.5 oz has 90 mg.  Pork loin chops -- 4 oz has 86 mg.  Canned salmon -- 3.5 oz has 83 mg.  Fat-trimmed beef top loin -- 4 oz has 78 mg.  Frankfurter -- 1 frank (3.5 oz) has 77 mg.  Crab -- 3.5 oz has 71 mg.  Roasted chicken without skin, white meat -- 4 oz has 66 mg.  Light bologna -- 2 oz has 45 mg.  Deli-cut [place] -- 2 oz has 31 mg.  Canned tuna -- 3.5 oz has 31 mg.  Berniece Salines -- 1 oz has 29 mg.  Oysters and mussels (raw) -- 3.5 oz has 25 mg.  Mackerel -- 1 oz has 22 mg.  Trout -- 1 oz has 20 mg.  Pork sausage -- 1 link (1 oz) has 17 mg.  Salmon -- 1 oz has 16 mg.  Tilapia -- 1 oz has 14 mg. Dairy  Soft-serve ice cream --  cup (4 oz) has 103 mg.  Whole-milk yogurt -- 1 cup (8 oz) has 29 mg.  Cheddar cheese -- 1 oz has 28 mg.  American cheese -- 1 oz has 28 mg.  Whole milk -- 1 cup (8 oz) has 23  mg.  2% milk -- 1 cup (8 oz) has 18 mg.  Cream cheese -- 1 tablespoon (Tbsp) has 15 mg.  Cottage cheese --  cup (4 oz) has 14 mg.  Low-fat (1%) milk -- 1 cup (8 oz) has 10 mg.  Sour cream -- 1 Tbsp has 8.5 mg.  Low-fat yogurt -- 1 cup (8 oz) has 8 mg.  Nonfat Greek yogurt -- 1 cup (8 oz) has 7 mg.  Half-and-half cream -- 1 Tbsp has 5 mg. Fats and oils  Cod liver oil -- 1 tablespoon (Tbsp) has 82 mg.  Butter -- 1 Tbsp has 15 mg.  Lard -- 1 Tbsp has 14 mg.  Bacon grease -- 1 Tbsp has 14 mg.  Mayonnaise -- 1 Tbsp has 5-10 mg.  Margarine -- 1 Tbsp has 3-10 mg. Exact amounts of cholesterol in these foods may vary depending on specific ingredients  and brands. Foods without cholesterol Most plant-based foods do not have cholesterol unless you combine them with a food that has cholesterol. Foods without cholesterol include:  Grains and cereals.  Vegetables.  Fruits.  Vegetable oils, such as olive, canola, and sunflower oil.  Legumes, such as peas, beans, and lentils.  Nuts and seeds.  Egg whites. Summary  The body needs cholesterol in small amounts, but too much cholesterol can cause damage to the arteries and heart.  Most people should eat less than 200 milligrams (mg) of cholesterol a day. This information is not intended to replace advice given to you by your health care provider. Make sure you discuss any questions you have with your health care provider. Document Revised: 12/10/2016 Document Reviewed: 08/24/2016 Elsevier Patient Education  [place].

## 2019-05-28 NOTE — Chronic Care Management (AMB) (Signed)
Chronic Care Management Pharmacy  Name: Kimberly [name]  MRN: [id number] DOB: 1945-04-22  Chief Complaint/ HPI  Kimberly [name],  74 y.o. , female presents for their Initial CCM visit with the clinical pharmacist via telephone due to COVID-19 Pandemic.  PCP : Kimberly Rail, MD  Their chronic conditions include: HTN, Afib, HLD, osteoarthritis, ostepenia,   Husband of 43 years, both retired now. Husband active, she needs walker to do most things - knee trouble, but wants to avoid surgery. Wants to avoid SNF for recovery from surgery. Pain wakes her up at night, sitting, standing.   Office Visits: 11/20/18 Dr Quay Burow [facility]: conditions stable, no med changes.  Consult Visit: 02/08/19 Dr Estanislado Pandy (rheumatology): leukocytoclastic vasculitis on prednisone, no flare  02/06/19 Dr Oval Linsey (cardiology): developed Afib 03/2017 post-op colovesicular fistula repair, stopped Afib treatment due to NSR on 30-day heart monitor. 03/2018 recurrent Afib, restarted Eliquis and flecainide PRN. Abnormal stress test - no CAD on cath. Avoid statins and nodal agents due to MG.   02/10/18 Dr Posey Pronto (neurology): MG dx 2011 at [facility]. Taking prednisone for LCV which also controls MG. No med changes.  Medications: Outpatient Encounter Medications as of 05/28/2019  Medication Sig  . CALCIUM PO Take 1,000 mg by mouth at bedtime.  . cholecalciferol (VITAMIN D) 1000 units tablet Take 1,000 Units by mouth at bedtime.  Marland Kitchen ELIQUIS 5 MG TABS tablet TAKE 1 TABLET BY MOUTH TWICE A DAY APPOINTMENT REQUIRED FOR FURTHER REFILLS [phone]  . flecainide (TAMBOCOR) 150 MG tablet Take 1 tablet (150 mg total) by mouth as needed (for AFIB).  . furosemide (LASIX) 40 MG tablet TAKE 1/2 TABLET DAILY. ANNUAL APPT W/LABS DUE IN MAY MUST SEE PROVIDER FOR FUTURE REFILLS  . losartan (COZAAR) 100 MG tablet TAKE 1 TABLET BY MOUTH DAILY. ANNUAL APPT W/LABS DUE IN MAY MUST SEE PROVIDER FOR FUTURE REFILLS  . Melatonin 10 MG CAPS Take by mouth.  .  Multiple Vitamin (MULTIVITAMIN WITH MINERALS) TABS tablet Take 1 tablet by mouth at bedtime.  . predniSONE (DELTASONE) 5 MG tablet TAKE 1 TABLET BY MOUTH EVERY DAY WITH BREAKFAST  . pyridostigmine (MESTINON) 60 MG tablet TAKE 1 TABLET BY MOUTH 3 TIMES A DAY  . pyridOXINE (VITAMIN B-6) 100 MG tablet Take 100 mg by mouth at bedtime.  Marland Kitchen spironolactone (ALDACTONE) 25 MG tablet TAKE 1 TABLET BY MOUTH EVERY DAY  . vitamin B-12 (CYANOCOBALAMIN) 1000 MCG tablet Take 1,000 mcg by mouth at bedtime.  . [DISCONTINUED] predniSONE (DELTASONE) 5 MG tablet TAKE 1 TABLET BY MOUTH EVERY DAY WITH BREAKFAST   No facility-administered encounter medications on file as of 05/28/2019.    Current Diagnosis/Assessment:  SDOH Interventions     Most Recent Value  SDOH Interventions  SDOH Interventions for the Following Domains  Financial Strain  Financial Strain Interventions  Other (Comment) [medications are generally affordable except Eliquis in donut hole]      Goals Addressed            This Visit's Progress   . Pharmacy Care Plan       CARE PLAN ENTRY  Current Barriers:  . Chronic Disease Management support, education, and care coordination needs related to Hypertension, Hyperlipidemia, and Atrial Fibrillation   Hypertension . Pharmacist Clinical Goal(s): o Over the next 150 days, patient will work with PharmD and providers to maintain BP goal <140/90 . Current regimen:  o Losartan 100 mg daily o Spironolactone 25 mg daily o Furosemide 40 mg - 1/2 tab daily .  Interventions: o Discussed BP goal and benefits of medications as well as diet . Patient self care activities - Over the next 150 days, patient will: o Check BP daily, document, and provide at future appointments o Ensure daily salt intake < 2300 mg/day  Hyperlipidemia Lipid Panel     Component Value Date/Time   CHOL 256 (H) 08/09/2014 0928   TRIG 180 (H) 08/09/2014 0928   HDL 74 08/09/2014 0928   LDLCALC 146 (H) 08/09/2014 0928  .  Pharmacist Clinical Goal(s): o Over the next 150 days, patient will work with PharmD and providers to achieve LDL goal < 100 . Current regimen:  o No medications (avoid statin with myasthenia gravis) . Interventions: o Discussed need for repeat cholesterol labs to determine goals and treatment options . Patient self care activities - Over the next 150 days, patient will: o Follow up with PCP and obtain repeat cholesterol panel o Continue low cholesterol diet  Atrial Fibrillation . Pharmacist Clinical Goal(s) o Over the next 150 days, patient will work with PharmD and providers to optimize anticoagulation regimen . Current regimen:  o Flecainide 150 mg as needed o Eliquis 5 mg BID . Interventions: o Discussed benefits and side effects of Eliquis o Discussed cost issues with Eliquis - may be cheaper at preferred pharmacy . Patient self care activities - Over the next 150 days, patient will: o May consider switching Eliquis to [employer] (preferred pharmacy) for cost savings  Medication management . Pharmacist Clinical Goal(s): o Over the next 150 days, patient will work with PharmD and providers to maintain optimal medication adherence . [facility] . Interventions o Comprehensive medication review performed. o Continue current medication management strategy . Patient self care activities - Over the next 150 days, patient will: o Focus on medication adherence by pill box o Take medications as prescribed o Report any questions or concerns to PharmD and/or provider(s)  Initial goal documentation       AFIB/Hypertension   Patient is currently rate controlled. HR 70s BPM  Office blood pressures are  BP Readings from Last 3 Encounters:  02/06/19 127/74  10/11/18 (!) 134/59  03/22/18 118/72   Kidney Function Lab Results  Component Value Date/Time   CREATININE 0.90 03/22/2018 03:30 PM   CREATININE 1.04 (H) 04/22/2017 03:11 PM   CREATININE 1.16 (H) 04/15/2016  03:28 PM   CREATININE 1.17 (H) 04/11/2015 04:10 PM   GFR 58.27 (L) 08/20/2016 11:14 AM   GFRNONAA >60 03/22/2018 03:30 PM   GFRNONAA 48 (L) 04/15/2016 03:28 PM   GFRAA >60 03/22/2018 03:30 PM   GFRAA 56 (L) 04/15/2016 03:28 PM   Patient has failed these meds in the past: n/a Patient is currently controlled on the following medications:   Losartan 100 mg daily  Spironolactone 25 mg daily  Furosemide 40 mg - 1/2 tab daily  Flecainide 150 mg as needed  Eliquis 5 mg BID  Patient checks BP at home daily  Patient home BP readings are ranging: 130s/70s  We discussed diet and exercise extensively; avoids salt; no fried foods; denies bleeding issues since starting Eliquis. Also reports LE edema has improved greatly since halving furosemide dose  Plan  Continue current medications and control with diet and exercise     Hyperlipidemia   Lipid Panel     Component Value Date/Time   CHOL 256 (H) 08/09/2014 0928   TRIG 180 (H) 08/09/2014 0928   HDL 74 08/09/2014 0928   LDLCALC 146 (H) 08/09/2014 [id number]  The ASCVD Risk score Mikey Bussing DC Jr., et al., 2013) failed to calculate for the following reasons:   Cannot find a previous HDL lab   Cannot find a previous total cholesterol lab   Patient has failed these meds in past: n/a Patient is currently uncontrolled on the following medications: no medications - avoiding statins due to myasthenia gravis  We discussed:  diet and exercise extensively; pt avoiding fried foods, pork, red meat; due for repeat lipid panel (last from 2016 and pt has changed diet since then). Lipid-lowering agents may exacerbate MG so providers have avoided statins, ezetimibe in the past. Pt is also very hesitant about starting new medications due to history of allergic reactions to antibiotics and OTC pain relievers.  Plan  Continue current medications and control with diet and exercise  F/u with PCP for repeat lipid panel   Myasthenia Gravis/Leukocytoclastic  vasculitis   Patient has failed these meds in past: methotrexate (LCV) Patient is currently controlled on the following medications:   Prednisone 5 mg daily  Pyridostigmine 60 mg TID   We discussed:  Pt is well controlled on current medications, denies issues.  Plan  Continue current medications  Osteopenia   Last DEXA Scan: not noted  No results found for: VD25OH  Patient has failed these meds in past: n/a Patient is currently controlled on the following medications:   Calcium 1000 mg HS  Vitamin D 1000 IU HS  We discussed: Pt reports she had BMD scan "many years ago", discussed need for repeat scan to determine osteopenia/osteoporosis status. Pt reports adherence with calcium/vitamin D as above. She denies hx of fracture in last 25 years.  Plan  Continue current medications  F/u with PCP to discuss repeat DEXA  Knee pain   Patient has failed these meds in past: ibuprofen, naproxen, tylenol Patient is currently uncontrolled on the following medications:   No medications  We discussed: Pt has had reactions and Afib-recurrence after taking OTC pain relievers. She had a rash with Aspercreme. She has tried Voltaren gel for 1-2 days but stopped due to no benefit. Discussed Voltaren gel may take 2-4 weeks for maximum benefit, pt agreed to retry.  Plan  Recommended diclofenac gel OTC for knee pain  Health Maintenance   Patient is currently controlled on the following medications:   Multivitamin   Vitamin B-6 100 mg HS  Melatonin 10 mg HS  Vitamin B12 1000 mcg  We discussed:  Patient is satisfied with current OTC regimen and denies issues  Plan  Continue current medications    Medication Management   [facility] Uses pill box? Yes Pt endorses 100% compliance  We discussed: [facility] due to excellent relationship with pharmacist there. Discussed switching Eliquis only to  [facility] for cost savings.  Plan  Continue current medication management strategy    Follow up: 5 month phone visit  Charlene Brooke, PharmD Clinical Pharmacist [facility] [phone]

## 2019-06-10 NOTE — Progress Notes (Signed)
Virtual Visit via phone Note  I connected with Kimberly [name] on 06/12/19 at  3:30 PM EDT by telephone and verified that I am speaking with the correct person using two identifiers.   I discussed the limitations of evaluation and management by telemedicine and the availability of in person appointments. The patient expressed understanding and agreed to proceed.  Present for the visit:  Myself, Dr [name], Westley Hummer.  The patient is currently at home and I am in the office.    No referring provider.    History of Present Illness: This visit is for follow up of her chronic medical conditions.     Afib, Hypertension: She is taking her medication daily. She is compliant with a low sodium diet.   She does monitor her blood pressure at home.  Her BP is controlled - toda 134/77.  She has an as needed medication for her Afib.    Myasthenia gravis:  She follows with neurology.  B/l leg edema:  She takes lasix daily.  The swelling is controlled.   Vasculitis, diffuse joint pain:  She follows with rheumatology.  She is on low dose prednisone.  Her pain is severe.  Rheumatology advised considering knee replacement, but she feels that will help some, but not the other joint pain.  She was diagnosed with lupus years ago and wonders if there is another cause for some of her pain.  Her pain is constant even at rest.  She wonders about lupus and lyme since she had a tick bite years ago.    Oxygen 95%,  Weight 287  Review of Systems  Constitutional: Negative for fever.  Respiratory: Negative for cough, shortness of breath and wheezing.   Cardiovascular: Positive for palpitations (occ) and leg swelling. Negative for chest pain.  Musculoskeletal: Positive for joint pain.  Neurological: Negative for headaches.       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  Had one child that only lived for 14 hours.     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          Epworth Sleepiness Scale = 2 (as of 03/14/2015)   Social Determinants of Health   Financial Resource Strain: Low Risk    Difficulty of Paying Living Expenses: Not very hard  Food Insecurity:    Worried About Charity fundraiser in the Last Year:    Arboriculturist in the Last Year:   Transportation Needs:    Film/video editor (Medical):    Lack of Transportation (Non-Medical):   Physical Activity:    Days of Exercise per Week:    Minutes of Exercise per Session:   Stress:    Feeling of Stress :   Social Connections:    Frequency  of Communication with Friends and Family:    Frequency of Social Gatherings with Friends and Family:    Attends Religious Services:    Active Member of Clubs or Organizations:    Attends Music therapist:    Marital Status:      Assessment and Plan:  See Problem List for Assessment and Plan of chronic medical problems.   Follow Up Instructions:    I discussed the assessment and treatment plan with the patient. The patient was provided an opportunity to ask questions and all were answered. The patient agreed with the plan and demonstrated an understanding of the instructions.   The patient was advised to call back or seek an in-person evaluation if the symptoms worsen or if the condition fails to improve as anticipated.   Time spent on  telephone:  15 min  [name], MD

## 2019-06-12 ENCOUNTER — Telehealth (INDEPENDENT_AMBULATORY_CARE_PROVIDER_SITE_OTHER): Payer: Medicare Other | Admitting: Internal Medicine

## 2019-06-12 ENCOUNTER — Encounter: Payer: Self-pay | Admitting: Internal Medicine

## 2019-06-12 DIAGNOSIS — I480 Paroxysmal atrial fibrillation: Secondary | ICD-10-CM

## 2019-06-12 DIAGNOSIS — R600 Localized edema: Secondary | ICD-10-CM

## 2019-06-12 DIAGNOSIS — G7000 Myasthenia gravis without (acute) exacerbation: Secondary | ICD-10-CM | POA: Diagnosis not present

## 2019-06-12 DIAGNOSIS — I10 Essential (primary) hypertension: Secondary | ICD-10-CM | POA: Diagnosis not present

## 2019-06-12 NOTE — Assessment & Plan Note (Signed)
chronic Management per Dr Posey Pronto

## 2019-06-12 NOTE — Assessment & Plan Note (Signed)
Chronic Following with cardiology On flecainide, eliquis Due for blood work --  Needs to arrange so she can get all of her blood work done at the same time

## 2019-06-12 NOTE — Assessment & Plan Note (Signed)
Chronic BP well controlled at home Current regimen effective and well tolerated Continue current medications at current doses   

## 2019-06-12 NOTE — Assessment & Plan Note (Signed)
Chronic Controlled Continue lasix at current dose

## 2019-07-11 ENCOUNTER — Encounter: Payer: Self-pay | Admitting: Rheumatology

## 2019-07-11 ENCOUNTER — Other Ambulatory Visit: Payer: Self-pay

## 2019-07-11 ENCOUNTER — Telehealth (INDEPENDENT_AMBULATORY_CARE_PROVIDER_SITE_OTHER): Payer: Medicare Other | Admitting: Rheumatology

## 2019-07-11 DIAGNOSIS — M170 Bilateral primary osteoarthritis of knee: Secondary | ICD-10-CM | POA: Diagnosis not present

## 2019-07-11 DIAGNOSIS — M7061 Trochanteric bursitis, right hip: Secondary | ICD-10-CM

## 2019-07-11 DIAGNOSIS — M310 Hypersensitivity angiitis: Secondary | ICD-10-CM | POA: Diagnosis not present

## 2019-07-11 DIAGNOSIS — G7000 Myasthenia gravis without (acute) exacerbation: Secondary | ICD-10-CM

## 2019-07-11 DIAGNOSIS — M8589 Other specified disorders of bone density and structure, multiple sites: Secondary | ICD-10-CM

## 2019-07-11 DIAGNOSIS — M2550 Pain in unspecified joint: Secondary | ICD-10-CM

## 2019-07-11 DIAGNOSIS — Z9889 Other specified postprocedural states: Secondary | ICD-10-CM | POA: Diagnosis not present

## 2019-07-11 DIAGNOSIS — L409 Psoriasis, unspecified: Secondary | ICD-10-CM

## 2019-07-11 DIAGNOSIS — Z79899 Other long term (current) drug therapy: Secondary | ICD-10-CM

## 2019-07-11 DIAGNOSIS — Z8719 Personal history of other diseases of the digestive system: Secondary | ICD-10-CM

## 2019-07-11 DIAGNOSIS — Z8639 Personal history of other endocrine, nutritional and metabolic disease: Secondary | ICD-10-CM

## 2019-07-11 DIAGNOSIS — Z8679 Personal history of other diseases of the circulatory system: Secondary | ICD-10-CM

## 2019-07-11 DIAGNOSIS — M5030 Other cervical disc degeneration, unspecified cervical region: Secondary | ICD-10-CM

## 2019-07-11 DIAGNOSIS — I2510 Atherosclerotic heart disease of native coronary artery without angina pectoris: Secondary | ICD-10-CM

## 2019-07-11 NOTE — Progress Notes (Signed)
Virtual Visit via Telephone Note  I connected with Kimberly [name] on 07/11/19 at  4:00 PM EDT by telephone and verified that I am speaking with the correct person using two identifiers.  Location: Patient: Home  Provider: Clinic  This service was conducted via virtual visit.  The patient was located at home. I was located in my office.  Consent was obtained prior to the virtual visit and is aware of possible charges through their insurance for this visit.  The patient is an established patient.  Dr. Estanislado Pandy, MD conducted the virtual visit and [name], PA-C acted as scribe during the service.  Office staff helped with scheduling follow up visits after the service was conducted.     I discussed the limitations, risks, security and privacy concerns of performing an evaluation and management service by telephone and the availability of in person appointments. I also discussed with the patient that there may be a patient responsible charge related to this service. The patient expressed understanding and agreed to proceed.  CC: Pain in multiple joints  History of Present Illness: Patient is a 73 year old female with a past medical history of leukocytoclastic vasculitis and osteoarthritis.  She takes prednisone 5 mg by mouth daily.  She discontinued MTX in September 2018 without any recurrences in her symptoms. She denies any recent rashes.  She has been experiencing severe pain in multiple joints including both hands and both knee joints.  She states her joint pain has been preventing her from performing her normal activities.  She has difficulty with ambulation.  She does not want to proceed with a knee replacement at this time.  She has been to physical therapy in the past and continues to perform home exercises.   Patient reports in the past she had a positive ANA and is concerned about another underlying autoimmune conditions such as lupus.     Observations/Objective: Review of Systems   Constitutional: Negative for fever and malaise/fatigue.  HENT: Negative for congestion.   Eyes: Negative for photophobia, pain, discharge and redness.  Respiratory: Negative for cough, shortness of breath and wheezing.   Cardiovascular: Positive for leg swelling. Negative for chest pain and palpitations.  Gastrointestinal: Positive for diarrhea. Negative for blood in stool and constipation.  Genitourinary: Negative for dysuria and urgency.  Musculoskeletal: Positive for joint pain. Negative for back pain, myalgias and neck pain.  Skin: Positive for rash.  Neurological: Negative for dizziness and headaches.  Endo/Heme/Allergies: Bruises/bleeds easily.  Psychiatric/Behavioral: Negative for depression and memory loss. The patient is not nervous/anxious and does not have insomnia.    Physical Exam Neurological:     Mental Status: She is alert and oriented to person, place, and time.  Psychiatric:        Mood and Affect: Mood and affect normal.        Cognition and Memory: Memory normal.        Judgment: Judgment normal.    Patient reports joint stiffness all day Patient denies nocturnal pain.  Difficulty dressing/grooming: Reports Difficulty climbing stairs: Reports Difficulty getting out of chair: Reports Difficulty using hands for taps, buttons, cutlery, and/or writing: Reports   Assessment and Plan: Visit Diagnoses:Leukocytoclastic vasculitis ([place])- Bx proven, lower extremity involvement: She has not had any signs or symptoms of a flare. She is clinically doing well on Prednisone 5 mg po daily.  She has been off of MTX since September 2018 after developing a colovesicular fistula.  She has not had any recurrence in  her symptoms since discontinuing MTX. No recent rashes. She was advised to notify us if she develops signs or symptoms of a flare. She does not require immunosuppressive therapy at this time.   High risk medication use -She takesPrednsione 5 mg by mouth daily.She has  beenoff MTX sinceSeptember2018-s/pcolovesicular fistula. She had surgical correction in March 2019.She is not taking any immunosuppressive agents at this time.   Polyarthralgia: She has been experiencing severe arthralgias over the past several months.  Her pain has been most severe in both hands and both knee joints.   She has not noticed any joint inflammation. She has been more sedentary during the pandemic, which is likely contributing to all day joint stiffness she is experiencing.  She is not ready to proceed with a knee replacement at this time.  She has had difficulty with ADLs and increased nocturnal pain. She is unable to take NSAIDs due to taking Eliquis.  She has tried using voltaren gel in the past but had an inadequate response. She is concerned about an underlying rheumatologic disease that is contributing to her increased joint pain.  According to the patient she has a history of positive ANA.  We will obtain autoimmune lab work to complete the workup.  Future orders will be placed today. She declined a referral to pain management at this time.   Joint protection and muscle strengthening was encouraged.  She has been performing home exercises.   DDD (degenerative disc disease), cervical: She has intermittent neck pain and stiffness.    Primary osteoarthritis of both knees:She has been experiencing severe pain in both knee joints.  She has difficulty ambulating and performing ADLs due to the discomfort she is experiencing.  She has difficulty climbing steps and getting up from a seated position.  She tried using voltaren gel topically in the past but she did not notice any improvement.   H/O repair of rotator cuff: Right: Chronic pain.  She has painful ROM and nocturnal pain.    Psoriasis -Managed byDr. Jarome Matin.  Osteopenia of multiple sites - Followed by PCP.She takes calcium and vitamin D on a regular basis.  Trochanteric bursitis, right hip: She declined a  cortisone injection and physical therapy in the past.   Follow Up Instructions: She will follow up in 5 months.    I discussed the assessment and treatment plan with the patient. The patient was provided an opportunity to ask questions and all were answered. The patient agreed with the plan and demonstrated an understanding of the instructions.   The patient was advised to call back or seek an in-person evaluation if the symptoms worsen or if the condition fails to improve as anticipated.  I provided 30 minutes of non-face-to-face time during this encounter.   Ofilia Neas, PA-C

## 2019-07-12 ENCOUNTER — Telehealth: Payer: Medicare Other | Admitting: Rheumatology

## 2019-07-20 ENCOUNTER — Other Ambulatory Visit: Payer: Self-pay | Admitting: Internal Medicine

## 2019-07-20 DIAGNOSIS — I10 Essential (primary) hypertension: Secondary | ICD-10-CM

## 2019-08-10 ENCOUNTER — Other Ambulatory Visit: Payer: Self-pay | Admitting: Internal Medicine

## 2019-08-10 DIAGNOSIS — I10 Essential (primary) hypertension: Secondary | ICD-10-CM

## 2019-08-26 ENCOUNTER — Other Ambulatory Visit: Payer: Self-pay | Admitting: Rheumatology

## 2019-08-27 NOTE — Telephone Encounter (Signed)
Please schedule patient for a follow up visit. Patient was due July 2021. Thanks!

## 2019-08-27 NOTE — Telephone Encounter (Signed)
Last Visit: 02/08/2019 Next Visit: due in July 2021. Message sent to the front to schedule patient.  Current Dose per office note on 02/08/2019: prednisone 5 mg po daily  Okay to refill Prednisone?

## 2019-08-27 NOTE — Telephone Encounter (Addendum)
I LMOM for patient to call, and schedule a return office visit. Patient had a virtual appointment 07/11/19, and was to follow up in 5 months post virtual appointment. Patient due for follow up around 12/11/2019.

## 2019-09-03 ENCOUNTER — Telehealth: Payer: Self-pay | Admitting: Rheumatology

## 2019-09-03 NOTE — Telephone Encounter (Signed)
I spoke with patient in regards to scheduling a follow up appointment for 11/2019. Per patient, she has not been anywhere in little over a year, and prefers to wait until scheduling a follow up appointment. Patient will call back first of November to schedule.

## 2019-09-10 ENCOUNTER — Telehealth: Payer: Self-pay | Admitting: Internal Medicine

## 2019-09-10 MED ORDER — LOSARTAN POTASSIUM 100 MG PO TABS: ORAL_TABLET | ORAL | 1 refills | Status: DC

## 2019-09-10 NOTE — Telephone Encounter (Signed)
Patient does not understand why her RX was not filled. She saw Dr.Burns on 6/1 for a virtual visit and states her and Dr.Burns discussed why she would not be coming into the office for blood work. She states all these should be in Dr.Burns notes.  She would like the medication filled.

## 2019-09-10 NOTE — Addendum Note (Signed)
Addended by: [name] on: 09/10/2019 04:17 PM   Modules accepted: Orders

## 2019-09-10 NOTE — Telephone Encounter (Signed)
Losartan was sent to her pharmacy.  I know she was going to try to arrange to get all of her blood work done at the same time and should try to do that so we can check her kidney function and other blood work with all of her medications.

## 2019-09-11 NOTE — Telephone Encounter (Signed)
Notified pt w/MD response.../lmb 

## 2019-09-19 ENCOUNTER — Encounter: Payer: Self-pay | Admitting: Cardiovascular Disease

## 2019-09-19 ENCOUNTER — Telehealth (INDEPENDENT_AMBULATORY_CARE_PROVIDER_SITE_OTHER): Payer: Medicare Other | Admitting: Cardiovascular Disease

## 2019-09-19 VITALS — BP 138/74 | HR 72 | Ht 62.0 in | Wt 294.0 lb

## 2019-09-19 DIAGNOSIS — E785 Hyperlipidemia, unspecified: Secondary | ICD-10-CM | POA: Diagnosis not present

## 2019-09-19 DIAGNOSIS — I10 Essential (primary) hypertension: Secondary | ICD-10-CM | POA: Diagnosis not present

## 2019-09-19 DIAGNOSIS — I480 Paroxysmal atrial fibrillation: Secondary | ICD-10-CM

## 2019-09-19 DIAGNOSIS — I2510 Atherosclerotic heart disease of native coronary artery without angina pectoris: Secondary | ICD-10-CM | POA: Diagnosis not present

## 2019-09-19 MED ORDER — FLECAINIDE ACETATE 150 MG PO TABS: 150.0000 mg | ORAL_TABLET | ORAL | 5 refills | Status: DC | PRN

## 2019-09-19 NOTE — Patient Instructions (Signed)
Medication Instructions:  Your physician recommends that you continue on your current medications as directed. Please refer to the Current Medication list given to you today.  *If you need a refill on your cardiac medications before your next appointment, please call your pharmacy*  Lab Work: NONE  Testing/Procedures: NONE   Follow-Up: At [facility], you and your health needs are our priority.  As part of our continuing mission to provide you with exceptional heart care, we have created designated Provider Care Teams.  These Care Teams include your primary Cardiologist (physician) and Advanced Practice Providers (APPs -  Physician Assistants and Nurse Practitioners) who all work together to provide you with the care you need, when you need it.  We recommend signing up for the patient portal called "MyChart".  Sign up information is provided on this After Visit Summary.  MyChart is used to connect with patients for Virtual Visits (Telemedicine).  Patients are able to view lab/test results, encounter notes, upcoming appointments, etc.  Non-urgent messages can be sent to your provider as well.   To learn more about what you can do with MyChart, go to [email or web address].    Your next appointment:   03/26/2020 AT 2:20 IN PERSON WITH [facility]

## 2019-09-19 NOTE — Addendum Note (Signed)
Addended by: Alvina Filbert B on: 09/19/2019 12:23 PM   Modules accepted: Orders

## 2019-09-19 NOTE — Progress Notes (Signed)
Virtual Visit via Telephone Note   This visit type was conducted due to national recommendations for restrictions regarding the COVID-19 Pandemic (e.g. social distancing) in an effort to limit this patient's exposure and mitigate transmission in our community.  Due to her co-morbid illnesses, this patient is at least at moderate risk for complications without adequate follow up.  This format is felt to be most appropriate for this patient at this time.  The patient did not have access to video technology/had technical difficulties with video requiring transitioning to audio format only (telephone).  All issues noted in this document were discussed and addressed.  No physical exam could be performed with this format.  Please refer to the patient's chart for her  consent to telehealth for [facility].    Date:  09/19/2019   ID:  Kimberly [name], DOB 1945-07-06, MRN [id number]  Patient Location: Home Provider Location: Office  PCP:  [name], MD  Cardiologist:  Skeet Latch, MD  Electrophysiologist:  None   Evaluation Performed:  Follow-Up Visit  Chief Complaint:  Atrial fibrillation  History of Present Illness:    Kimberly [name] is a 74 y.o. female with paroxysmal atrial fibrillation, hypertension, coronary artery calcification, myasthenia gravis, R LE lymphedema, recurrent R pleural effusion, hyperlipidemia who presents for follow up. She was initially seen 03/2015 for evaluation of asymptomatic coronary calcifications.  She had a chest CT to follow up on pleural effusions and was noted to have coronary calcification. She was asymptomatic but did not exercise much due to myasthenia.  She had a [employer] 03/2015 that was concerning for inferior and apical ischemia.  She subsequently underwent left heart catheterization 04/2015 that showed normal, but tortuous coronary arteries.  She reported palpitations and had a 30 day event monitor that showed paroxysmal atrial  tachycardia and NSVT. She was not started on any nodal agents due to her history of myasthenia.  She had an echo 05/2015 that revealed LVEF 60-65% with normal diastolic function.  [name] was admitted 03/2017 for repair of a colovesicular fistula.  On postoperative day 6 she developed atrial fibrillation with rapid ventricular response.  She received 1 dose of IV metoprolol but was switched to amiodarone given her myasthenia.  She converted back to sinus rhythm on amiodarone and was started on Eliquis.  She followed up with Almyra Deforest, PA, 03/2017 and reported dizziness and nausea that was thought to be due to the amiodarone.  Given that her atrial for relation was thought to be postoperative amiodarone was decreased with plans to discontinue it.  She did not have any recurrent atrial fibrillation after that time.  She again wore a 30-day event monitor that was without atrial fibrillation. She had an episode of afib 03/2018 and was seen in the atrial fibrillation clinic.  She was restarted on Eliquis. She was started on flecainide to be taken as needed.  Since her last appointment she had a couple short episodes of atrial fibrillation.  The longest was 3.5 hour in April.  One week she had two in one week.  Most week she doesn't have any episodes.  The most recent was 30 minutes.  This is more typical.  It makes her feel exhausted and anxious.  When she gets up and moves around it seems to go away.  Her main complaint is joint pain.  Otherwise she feels well.  She continues to walk when she is able.  She has no chest pain   The  patient does not have symptoms concerning for COVID-19 infection (fever, chills, cough, or new shortness of breath).    Past Medical History:  Diagnosis Date  . Allergy   . Aortic atherosclerosis ([place])   . Atrial tachycardia ([place]) 05/12/2015  . Cataract   . Colovesical fistula   . Diverticulitis   . Diverticulosis   . DJD (degenerative joint disease), cervical   . Frequent UTI    . GERD (gastroesophageal reflux disease)   . History of hiatal hernia    Small hernia  . Hypertension   . Internal hemorrhoids 01/21/2017   noted on colonopscopy  . Leukocytoclastic vasculitis ([place])   . Myalgia and myositis, unspecified   . Myasthenia gravis without exacerbation ([facility])    [facility], Dr Anna Genre  . NSVT (nonsustained ventricular tachycardia) ([place]) 05/12/2015  . Osteoarthritis    Both knees  . Osteopenia   . Other and unspecified hyperlipidemia   . Periumbilical hernia   . Pneumonia 04/2016  . PONV (postoperative nausea and vomiting)   . Psoriasis    plantar ;Dr Jarome Matin  . Sleep difficulties   . Vasculitis ([place])    Dr Estanislado Pandy  . Vitamin D deficiency    Past Surgical History:  Procedure Laterality Date  . APPENDECTOMY     & exploratory for inflammation; Dr Hassell Done  . CARDIAC CATHETERIZATION N/A 04/17/2015   Procedure: Left Heart Cath and Coronary Angiography;  Surgeon: Leonie Man, MD;  Location: [place] [facility];  Service: Cardiovascular;  Laterality: N/A;  . CERVICAL FUSION     C5-6; Dr Vertell Limber  . CHOLECYSTECTOMY     for stones  . COLONOSCOPY W/ POLYPECTOMY  01/21/2017  . CYSTOSCOPY WITH STENT PLACEMENT Bilateral 03/18/2017   Procedure: CYSTOSCOPY WITH RETROGRADE AND BILATERAL URETERAL STENT PLACEMENT;  Surgeon: Lucas Mallow, MD;  Location: [facility];  Service: Urology;  Laterality: Bilateral;  . HERNIA REPAIR    . IR RADIOLOGIST EVAL & MGMT  11/16/2016  . IR RADIOLOGIST EVAL & MGMT  12/08/2016  . IR RADIOLOGIST EVAL & MGMT  12/16/2016  . LAMINECTOMY  2010   T1-2; Dr Vertell Limber  . PARTIAL COLECTOMY N/A 03/18/2017   Procedure: OPEN SIGMOIDECTOMY;  Surgeon: Leighton Ruff, MD;  Location: [facility];  Service: General;  Laterality: N/A;  . SHOULDER SURGERY     Left & Right  . THORACENTESIS     for peripneumonic effusion  . TOTAL ABDOMINAL HYSTERECTOMY W/ BILATERAL SALPINGOOPHORECTOMY     For Fibroids & endometriosis  . TOTAL SHOULDER ARTHROPLASTY    . UMBILICAL  HERNIA REPAIR N/A 03/18/2017   Procedure: OPEN HERNIA REPAIR UMBILICAL ADULT;  Surgeon: Leighton Ruff, MD;  Location: [facility];  Service: General;  Laterality: N/A;  ERAs pathway     Current Meds  Medication Sig  . CALCIUM PO Take 1,000 mg by mouth at bedtime.  . cholecalciferol (VITAMIN D) 1000 units tablet Take 1,000 Units by mouth at bedtime.  Marland Kitchen ELIQUIS 5 MG TABS tablet TAKE 1 TABLET BY MOUTH TWICE A DAY APPOINTMENT REQUIRED FOR FURTHER REFILLS [phone]  . flecainide (TAMBOCOR) 150 MG tablet Take 1 tablet (150 mg total) by mouth as needed (for AFIB).  . furosemide (LASIX) 40 MG tablet TAKE 1/2 TABLET DAILY. ANNUAL APPT W/LABS DUE IN MAY MUST SEE PROVIDER FOR FUTURE REFILLS  . losartan (COZAAR) 100 MG tablet TAKE 1 TABLET BY MOUTH DAILY.  . Melatonin 10 MG CAPS Take by mouth.  . Multiple Vitamin (MULTIVITAMIN WITH MINERALS) TABS tablet Take  1 tablet by mouth at bedtime.  . predniSONE (DELTASONE) 5 MG tablet TAKE 1 TABLET BY MOUTH EVERY DAY WITH BREAKFAST  . pyridostigmine (MESTINON) 60 MG tablet TAKE 1 TABLET BY MOUTH 3 TIMES A DAY  . pyridOXINE (VITAMIN B-6) 100 MG tablet Take 100 mg by mouth at bedtime.  Marland Kitchen spironolactone (ALDACTONE) 25 MG tablet TAKE 1 TABLET BY MOUTH EVERY DAY  . vitamin B-12 (CYANOCOBALAMIN) 1000 MCG tablet Take 1,000 mcg by mouth at bedtime.     Allergies:   Azithromycin, Ibuprofen, Levaquin [levofloxacin in d5w], Tetracycline, Isovue [iopamidol], Sulfonamide derivatives, Aleve [naproxen], Tape, Zosyn [piperacillin sod-tazobactam so], Amlodipine besy-benazepril hcl, and Amoxicillin   Social History   Tobacco Use  . Smoking status: Never Smoker  . Smokeless tobacco: Never Used  Vaping Use  . Vaping Use: Never used  Substance Use Topics  . Alcohol use: No    Alcohol/week: 0.0 standard drinks  . Drug use: No     Family Hx: The patient's family history includes Cancer in her father; Heart attack (age of onset: 43) in her mother; Heart failure in her mother;  Hypertension in her sister; Hyperthyroidism in her sister; Lung cancer in her father; Stroke in her paternal grandmother. There is no history of Diabetes, Colon cancer, Esophageal cancer, Stomach cancer, or Rectal cancer.  ROS:   Please see the history of present illness.     All other systems reviewed and are negative.   Prior CV studies:   The following studies were reviewed today:  Echo 05/26/15: Study Conclusions  - Left ventricle: The cavity size was mildly dilated. Systolic function was normal. The estimated ejection fraction was in the range of 60% to 65%. Wall motion was normal; there were no regional wall motion abnormalities. Left ventricular diastolic function parameters were normal. - Aortic valve: Transvalvular velocity was within the normal range. There was no stenosis. There was no regurgitation. - Mitral valve: There was no regurgitation. - Left atrium: The atrium was mildly dilated. - Right ventricle: The cavity size was normal. Wall thickness was normal. Systolic function was normal. - Tricuspid valve: There was no regurgitation. - Inferior vena cava: The vessel was normal in size. The respirophasic diameter changes were in the normal range (= 50%), consistent with normal central venous pressure.   28 Day Event Monitor 04/01/15:  Quality: Fair.  Baseline artifact. Predominant rhythm: sinus rhythm Average heart rate: 70 bpm Max heart rate: 126 bpm Min heart rate: 50 bpm  Short runs of atrial tachycardia lasting up to 11 seconds.  One 6 beat run of NSVT.   Lexiscan Cardiolite 03/28/15: 1. Nuclear stress EF: 71%. 2. The left ventricular ejection fraction is hyperdynamic (>65%). 3. There was no ST segment deviation noted during stress. 4. This is an intermediate risk study. 5. Findings consistent with ischemia.  Technically difficult study due to increased subdiaphragmatic activity; intermediate risk with small, severe, reversible  defects in the apical and inferior basal walls consistent with ischemia; EF 71 with normal wall motion.  LHC 04/17/15: 6. The left ventricular systolic function is normal. 7. Angiographically normal coronary arteries, but very tortuous   Angiographically no evidence of any significant lesions to explain the patient's abnormal stress test. There does appear to be mild calcification in the LAD distribution, but certainly not on the luminal aspect.  Labs/Other Tests and Data Reviewed:    EKG:  An ECG dated 03/22/18 was personally reviewed today and demonstrated:  sinus rhythm.  Rate 77 bpm.  Non-specific ST-T  changes  Recent Labs: No results found for requested labs within last 8760 hours.   Recent Lipid Panel Lab Results  Component Value Date/Time   CHOL 256 (H) 08/09/2014 09:28 AM   TRIG 180 (H) 08/09/2014 09:28 AM   HDL 74 08/09/2014 09:28 AM   LDLCALC 146 (H) 08/09/2014 09:28 AM    Wt Readings from Last 3 Encounters:  09/19/19 294 lb (133.4 kg)  02/06/19 289 lb (131.1 kg)  10/11/18 287 lb (130.2 kg)     Objective:    Vital Signs:  BP 138/74   Pulse 72   Ht 5\' 2"  (1.575 m)   Wt 294 lb (133.4 kg)   SpO2 95%   BMI 53.77 kg/m    VITAL SIGNS:  reviewed GEN:  no acute distress RESPIRATORY:  non-labored PSYCH:  normal affect  ASSESSMENT & PLAN:    # PAF: Noted postoperatively.  She had a couple episodes of atrial fibrillation.  The longest was 3.5 hours.  She hasn't tried flecainide but will try it if she has another episode. We are avoiding nodal agents given her Myasthenia Gravis.  Continue Eliquis.   # Coronary calcification: # Abnormal stress: No significant CAD on cath. She denies chest pain. Continue aspirin. Avoid statins or nodal agents due to her history of Myasthenia.  Her PCP follows her lipids.  LDL goal <70.  # Hypertension: BP well-controlled on losartan, spironolactone, and furosemide.  # Paroxysmal atrial tachycardia, NSVT: 30 day event monitor  showed runs of atrial tachycardia as well as one run of nonsustained ventricular tachycardia. Since her last appointment she had one episode.  No nodal agents 2/2 Myasthenia.  No ischmia or underlying structural heart disease.   COVID-19 Education: The signs and symptoms of COVID-19 were discussed with the patient and how to seek care for testing (follow up with PCP or arrange E-visit).  The importance of social distancing was discussed today.  Time:   Today, I have spent 42minutes with the patient with telehealth technology discussing the above problems.     Medication Adjustments/Labs and Tests Ordered: Current medicines are reviewed at length with the patient today.  Concerns regarding medicines are outlined above.   Tests Ordered: No orders of the defined types were placed in this encounter.   Medication Changes: No orders of the defined types were placed in this encounter.   Follow Up: with Oktober Glazer C. Oval Linsey, MD, [facility] in 6 months.  Signed, Skeet Latch, MD  09/19/2019 12:09 PM    [place]

## 2019-10-10 ENCOUNTER — Ambulatory Visit: Payer: Medicare Other | Admitting: Pharmacist

## 2019-10-10 ENCOUNTER — Other Ambulatory Visit: Payer: Self-pay

## 2019-10-10 ENCOUNTER — Other Ambulatory Visit (HOSPITAL_COMMUNITY): Payer: Self-pay | Admitting: Cardiovascular Disease

## 2019-10-10 DIAGNOSIS — I480 Paroxysmal atrial fibrillation: Secondary | ICD-10-CM

## 2019-10-10 DIAGNOSIS — E785 Hyperlipidemia, unspecified: Secondary | ICD-10-CM

## 2019-10-10 DIAGNOSIS — M170 Bilateral primary osteoarthritis of knee: Secondary | ICD-10-CM

## 2019-10-10 DIAGNOSIS — I10 Essential (primary) hypertension: Secondary | ICD-10-CM

## 2019-10-10 NOTE — Chronic Care Management (AMB) (Signed)
Chronic Care Management Pharmacy  Name: Kimberly [name]  MRN: [id number] DOB: 05/14/1945  Chief Complaint/ HPI  Kimberly [name],  74 y.o. , female presents for their Initial CCM visit with the clinical pharmacist via telephone due to COVID-19 Pandemic.  PCP : [name], MD  Patient Care Team: [name], MD as PCP - General (Internal Medicine) Skeet Latch, MD as PCP - Cardiology (Cardiology) Charlton Haws, [facility] as Pharmacist (Pharmacist)   Their chronic conditions include: HTN, Afib, HLD, osteoarthritis, ostepenia,   Patient lives with husband of 12 years, both retired now. Husband is active, she needs walker to do most things - knee trouble, but wants to avoid surgery. Wants to avoid SNF for recovery from surgery. Pain wakes her up at night, sitting, standing.   Office Visits: 06/12/19 Dr Quay Burow VV: conditions stable, no med changes. Due for blood work.  11/20/18 Dr Quay Burow VV: conditions stable, no med changes.  Consult Visit: 09/19/19 Dr Oval Linsey (cardiology): LDL< 53 d/t coronary calcification. No nodal agents, statins due to Myasthenia.   07/11/19 Dr Estanislado Pandy (rheumatology): polyarthralgia, ordered autoimmune work up - future labs.  02/08/19 Dr Estanislado Pandy (rheumatology): leukocytoclastic vasculitis on prednisone, no flare  02/06/19 Dr Oval Linsey (cardiology): developed Afib 03/2017 post-op colovesicular fistula repair, stopped Afib treatment due to NSR on 30-day heart monitor. 03/2018 recurrent Afib, restarted Eliquis and flecainide PRN. Abnormal stress test - no CAD on cath. Avoid statins and nodal agents due to MG.   02/10/18 Dr Posey Pronto (neurology): MG dx 2011 at [facility]. Taking prednisone for LCV which also controls MG. No med changes.  Medications: Outpatient Encounter Medications as of 10/10/2019  Medication Sig  . CALCIUM PO Take 1,000 mg by mouth at bedtime.  . cholecalciferol (VITAMIN D) 1000 units tablet Take 1,000 Units by mouth at bedtime.  Marland Kitchen ELIQUIS 5 MG  TABS tablet TAKE 1 TABLET BY MOUTH TWICE A DAY APPOINTMENT REQUIRED FOR FURTHER REFILLS [phone]  . flecainide (TAMBOCOR) 150 MG tablet Take 1 tablet (150 mg total) by mouth as needed (for AFIB).  . furosemide (LASIX) 40 MG tablet TAKE 1/2 TABLET DAILY. ANNUAL APPT W/LABS DUE IN MAY MUST SEE PROVIDER FOR FUTURE REFILLS  . losartan (COZAAR) 100 MG tablet TAKE 1 TABLET BY MOUTH DAILY.  . Melatonin 10 MG CAPS Take by mouth.  . Multiple Vitamin (MULTIVITAMIN WITH MINERALS) TABS tablet Take 1 tablet by mouth at bedtime.  . predniSONE (DELTASONE) 5 MG tablet TAKE 1 TABLET BY MOUTH EVERY DAY WITH BREAKFAST  . pyridostigmine (MESTINON) 60 MG tablet TAKE 1 TABLET BY MOUTH 3 TIMES A DAY  . pyridOXINE (VITAMIN B-6) 100 MG tablet Take 100 mg by mouth at bedtime.  Marland Kitchen spironolactone (ALDACTONE) 25 MG tablet TAKE 1 TABLET BY MOUTH EVERY DAY  . vitamin B-12 (CYANOCOBALAMIN) 1000 MCG tablet Take 1,000 mcg by mouth at bedtime.   No facility-administered encounter medications on file as of 10/10/2019.    Current Diagnosis/Assessment:    Goals Addressed            This Visit's Progress   . Pharmacy Care Plan       CARE PLAN ENTRY  Current Barriers:  . Chronic Disease Management support, education, and care coordination needs related to Hypertension, Hyperlipidemia, and Atrial Fibrillation   Hypertension . Pharmacist Clinical Goal(s): o Over the next 180 days, patient will work with PharmD and providers to maintain BP goal <140/90 . Current regimen:  o Losartan 100 mg daily o Spironolactone 25 mg  daily o Furosemide 40 mg - 1/2 tab daily . Interventions: o Discussed BP goal and benefits of medications as well as diet . Patient self care activities - Over the next 180 days, patient will: o Check BP daily, document, and provide at future appointments o Ensure daily salt intake < 2300 mg/day  Hyperlipidemia Lab Results  Component Value Date/Time   LDLCALC 146 (H) 08/09/2014 09:28 AM  .  Pharmacist Clinical Goal(s): o Over the next 150 days, patient will work with PharmD and providers to achieve LDL goal < 100 . Current regimen:  o No medications (avoid statin with myasthenia gravis) . Interventions: o Discussed need for repeat cholesterol labs to determine goals and treatment options . Patient self care activities - Over the next 180 days, patient will: o Follow up with PCP and obtain repeat cholesterol panel o Continue low cholesterol diet  Atrial Fibrillation . Pharmacist Clinical Goal(s) o Over the next 180 days, patient will work with PharmD and providers to optimize anticoagulation regimen . Current regimen:  o Flecainide 150 mg as needed o Eliquis 5 mg BID . Interventions: o Discussed benefits and side effects of Eliquis o Discussed cost issues with Eliquis - may be cheaper at preferred pharmacy . Patient self care activities - Over the next 180 days, patient will: o May consider switching Eliquis to [employer] (preferred pharmacy) for cost savings  Joint pain . Pharmacist Clinical Goal(s) o Over the next 180 days, patient will work with PharmD and providers to optimize therapy . Current regimen:  o Tylenol as needed . Interventions: o Discussed benefits of capsaicin cream for joint pain . Patient self care activities - Over the next 180 days, patient will: o Try capsaicin cream over the counter for several days-weeks  Vaccines . Pharmacist Clinical Goal(s) o Over the next 180 days, patient will work with PharmD and providers to update vaccinations . Interventions: o Advised to get COVID vaccine at the local pharmacy o Given history of allergic reactions, advised to wait 2 weeks after completion of COVID vaccine to get influenza vaccine . Patient self care activities - Over the next 180 days, patient will: o Schedule COVID and influenza vaccines as above  Medication management . Pharmacist Clinical Goal(s): o Over the next 180 days, patient will  work with PharmD and providers to maintain optimal medication adherence . Current pharmacy: [facility] . Interventions o Comprehensive medication review performed. o Continue current medication management strategy . Patient self care activities - Over the next 180 days, patient will: o Focus on medication adherence by pill box o Take medications as prescribed o Report any questions or concerns to PharmD and/or provider(s)  Please see past updates related to this goal by clicking on the "Past Updates" button in the selected goal        AFIB/Hypertension   Patient is currently rate controlled. Pulse Readings from Last 3 Encounters:  09/19/19 72  02/06/19 76  10/11/18 74   Office blood pressures are  BP Readings from Last 3 Encounters:  09/19/19 138/74  02/06/19 127/74  10/11/18 (!) 134/59   Kidney Function Lab Results  Component Value Date/Time   CREATININE 0.90 03/22/2018 03:30 PM   CREATININE 1.04 (H) 04/22/2017 03:11 PM   CREATININE 1.16 (H) 04/15/2016 03:28 PM   CREATININE 1.17 (H) 04/11/2015 04:10 PM   GFR 58.27 (L) 08/20/2016 11:14 AM   GFRNONAA >60 03/22/2018 03:30 PM   GFRNONAA 48 (L) 04/15/2016 03:28 PM   GFRAA >60 03/22/2018 03:30 PM  GFRAA 56 (L) 04/15/2016 03:28 PM   Patient checks BP at home daily  Patient home BP readings are ranging: 130s/70s  Patient has failed these meds in the past: n/a Patient is currently controlled on the following medications:   Losartan 100 mg daily  Spironolactone 25 mg daily  Furosemide 40 mg - 1/2 tab daily  Flecainide 150 mg as needed  Eliquis 5 mg BID  We discussed: pt has not use flecainide for Afib episodes; she denies issues with bleeding;  Plan  Continue current medications and control with diet and exercise     Hyperlipidemia   LDL goal < 130  Lipid Panel     Component Value Date/Time   CHOL 256 (H) 08/09/2014 0928   TRIG 180 (H) 08/09/2014 0928   HDL 74 08/09/2014 0928   LDLCALC 146 (H) 08/09/2014  0928     The ASCVD Risk score (Tamaroa., et al., 2013) failed to calculate for the following reasons:   Cannot find a previous HDL lab   Cannot find a previous total cholesterol lab   Patient has failed these meds in past: n/a Patient is currently uncontrolled on the following medications: no medications - avoiding statins due to myasthenia gravis  We discussed:  diet and exercise extensively; pt has standing order for lipid panel and is planning to get all labs done at once at Rheumatology office on a Friday afternoon.  Plan  Continue control with diet and exercise    Osteoarthritis   Patient has failed these meds in past: ibuprofen, naproxen, tylenol, Voltaren gel (ineffective) Patient is currently uncontrolled on the following medications:   Tylenol [phone] mg q8h PRN  We discussed: Pt reports Voltaren gel was ineffective even after using for several weeks so she stopped; she reports Tylenol helps sometimes but other times not at all; discussed benefits of Capsaicin cream for joint pain  Plan  Recommended OTC capsaicin cream for joint pain  Vaccines   Reviewed and discussed patient's vaccination history.    Immunization History  Administered Date(s) Administered  . Influenza, High Dose Seasonal PF 09/11/2012, 11/01/2016, 11/24/2017  . Influenza,inj,Quad PF,6+ Mos 10/12/2014  . Influenza-Unspecified 10/11/2013, 10/24/2015  . Pneumococcal Conjugate-13 05/09/2015  . Pneumococcal Polysaccharide-23 04/19/2013   We discussed: patient has had reactions to vaccines in the past so she is worried about getting COVID and flu vaccines together. Advised to separate the vaccines by 2 weeks if she is worried.  Plan  Recommended patient receive COVID and influenza vaccine at local pharmacy   Medication Management   [facility] Uses pill box? Yes Pt endorses 100% compliance  We discussed: [facility] despite [facility]-preferred [facility]  status due to excellent relationship with pharmacist there.  Plan  Continue current medication management strategy    Follow up: 6 month phone visit  Charlene Brooke, PharmD, [facility] Clinical Pharmacist [facility] at [facility] [phone]

## 2019-10-10 NOTE — Patient Instructions (Addendum)
Visit Information  Phone number for Pharmacist: [phone]  Goals Addressed            This Visit's Progress   . Pharmacy Care Plan       CARE PLAN ENTRY  Current Barriers:  . Chronic Disease Management support, education, and care coordination needs related to Hypertension, Hyperlipidemia, and Atrial Fibrillation   Hypertension . Pharmacist Clinical Goal(s): o Over the next 180 days, patient will work with PharmD and providers to maintain BP goal <140/90 . Current regimen:  o Losartan 100 mg daily o Spironolactone 25 mg daily o Furosemide 40 mg - 1/2 tab daily . Interventions: o Discussed BP goal and benefits of medications as well as diet . Patient self care activities - Over the next 180 days, patient will: o Check BP daily, document, and provide at future appointments o Ensure daily salt intake < 2300 mg/day  Hyperlipidemia Lab Results  Component Value Date/Time   LDLCALC 146 (H) 08/09/2014 09:28 AM .  Pharmacist Clinical Goal(s): o Over the next 150 days, patient will work with PharmD and providers to achieve LDL goal < 100 . Current regimen:  o No medications (avoid statin with myasthenia gravis) . Interventions: o Discussed need for repeat cholesterol labs to determine goals and treatment options . Patient self care activities - Over the next 180 days, patient will: o Follow up with PCP and obtain repeat cholesterol panel o Continue low cholesterol diet  Atrial Fibrillation . Pharmacist Clinical Goal(s) o Over the next 180 days, patient will work with PharmD and providers to optimize anticoagulation regimen . Current regimen:  o Flecainide 150 mg as needed o Eliquis 5 mg BID . Interventions: o Discussed benefits and side effects of Eliquis o Discussed cost issues with Eliquis - may be cheaper at preferred pharmacy . Patient self care activities - Over the next 180 days, patient will: o May consider switching Eliquis to [employer] (preferred pharmacy) for  cost savings  Vaccines . Pharmacist Clinical Goal(s) o Over the next 180 days, patient will work with PharmD and providers to update vaccinations . Interventions: o Advised to get COVID vaccine at the local pharmacy o Given history of allergic reactions, advised to wait 2 weeks after completion of COVID vaccine to get influenza vaccine . Patient self care activities - Over the next 180 days, patient will: o Schedule COVID and influenza vaccines as above  Medication management . Pharmacist Clinical Goal(s): o Over the next 180 days, patient will work with PharmD and providers to maintain optimal medication adherence . [facility] . Interventions o Comprehensive medication review performed. o Continue current medication management strategy . Patient self care activities - Over the next 180 days, patient will: o Focus on medication adherence by pill box o Take medications as prescribed o Report any questions or concerns to PharmD and/or provider(s)  Please see past updates related to this goal by clicking on the "Past Updates" button in the selected goal        Patient verbalizes understanding of instructions provided today.  Telephone follow up appointment with pharmacy team member scheduled for: 6 months  Charlene Brooke, PharmD, BCACP Clinical Pharmacist [facility] at [facility] [phone] Capsaicin topical cream, lotion, solution What is this medicine? CAPSAICIN (cap SAY sin) is a pain reliever. It is used to treat pain in muscles or joints. This medicine may be used for other purposes; ask your health care provider or pharmacist if you have questions. COMMON BRAND NAME(S): Arthricare  for Women, Capzasin-HP, Capzasin-P, Castiva Warming, DermacinRx, Zostrix What should I tell my health care provider before I take this medicine? They need to know if you have any of these conditions:  broken or irritated skin  an unusual or allergic reaction to  capsaicin, hot peppers, other medicines, foods, dyes, or preservatives  pregnant or trying to get pregnant  breast-feeding How should I use this medicine? Use this medicine on the skin. Do not take by mouth. Follow the directions on the package or prescription label. Rub into painful area until there is little or no visible medicine left on the skin surface. Wash hands with soap and water after applying. If you are using this medicine for pain in the hands, do not wash your hands for at least 30 minutes after using this medicine. After using this medicine do not use a bandage, a heating pad, or expose the affected area to direct sun. Use your medicine at regular intervals. Do not use your medicine more often than directed. Talk to your pediatrician regarding the use of this medicine in children. Special care may be needed. Overdosage: If you think you have taken too much of this medicine contact a poison control center or emergency room at once. NOTE: This medicine is only for you. Do not share this medicine with others. What if I miss a dose? If you miss a dose, use it as soon as you can. If it is almost time for your next dose, use only that dose. Do not use double or extra doses. What may interact with this medicine? Interactions are not expected. Do not use any other skin products on the affected area without asking your doctor or health care professional. This list may not describe all possible interactions. Give your health care provider a list of all the medicines, herbs, non-prescription drugs, or dietary supplements you use. Also tell them if you smoke, drink alcohol, or use illegal drugs. Some items may interact with your medicine. What should I watch for while using this medicine? Tell your doctor or healthcare professional if your symptoms do not start to get better or if they get worse. What side effects may I notice from receiving this medicine? Side effects that you should report to  your doctor or health care professional as soon as possible:  allergic reactions like skin rash, itching or hives, swelling of the face, lips, or tongue  burning pain, redness that does not go away  cough  skin sores or thinning Side effects that usually do not require medical attention (report to your doctor or health care professional if they continue or are bothersome):  mild stinging or warmth where used This list may not describe all possible side effects. Call your doctor for medical advice about side effects. You may report side effects to FDA at 1-800-FDA-1088. Where should I keep my medicine? Keep out of the reach of young children. Store at room temperature, between 15 and 30 degrees C (59 and 86 degrees F). Do not freeze. Protect from light. Throw away any unused medicine after the expiration date. NOTE: This sheet is a summary. It may not cover all possible information. If you have questions about this medicine, talk to your doctor, pharmacist, or health care provider.  2020 Elsevier/Gold Standard (2010-12-14 16:38:28)

## 2019-11-04 DIAGNOSIS — Z23 Encounter for immunization: Secondary | ICD-10-CM | POA: Diagnosis not present

## 2019-11-06 ENCOUNTER — Other Ambulatory Visit: Payer: Self-pay | Admitting: Internal Medicine

## 2019-11-06 DIAGNOSIS — I10 Essential (primary) hypertension: Secondary | ICD-10-CM

## 2019-11-21 ENCOUNTER — Other Ambulatory Visit: Payer: Self-pay | Admitting: Rheumatology

## 2019-11-21 NOTE — Telephone Encounter (Signed)
Please schedule patient for a follow up appointment. Patient due November 2021. Thanks!

## 2019-11-21 NOTE — Telephone Encounter (Signed)
Last Visit: 07/11/2019 Next Visit: due in November 2021. Message sent to the front to schedule patient.  Current Dose per office note on 02/08/2019: prednisone 5 mg po daily  Okay to refill Prednisone?

## 2019-12-02 DIAGNOSIS — Z23 Encounter for immunization: Secondary | ICD-10-CM | POA: Diagnosis not present

## 2019-12-19 NOTE — Progress Notes (Deleted)
Office Visit Note  Patient: Kimberly [name]             Date of Birth: [birth year]           MRN: [id number]             PCP: [name], MD Referring: [name], MD Visit Date: 01/02/2020 Occupation: @GUAROCC @  Subjective:  No chief complaint on file.   History of Present Illness: [name] is a 75 y.o. female ***   Activities of Daily Living:  Patient reports morning stiffness for *** {minute/hour:19697}.   Patient {ACTIONS;DENIES/REPORTS:21021675::"Denies"} nocturnal pain.  Difficulty dressing/grooming: {ACTIONS;DENIES/REPORTS:21021675::"Denies"} Difficulty climbing stairs: {ACTIONS;DENIES/REPORTS:21021675::"Denies"} Difficulty getting out of chair: {ACTIONS;DENIES/REPORTS:21021675::"Denies"} Difficulty using hands for taps, buttons, cutlery, and/or writing: {ACTIONS;DENIES/REPORTS:21021675::"Denies"}  No Rheumatology ROS completed.   PMFS History:  Patient Active Problem List   Diagnosis Date Noted   Diarrhea 03/10/2018   Diverticular stricture ([place]) 03/18/2017   Sleep difficulties 10/31/2016   Bilateral leg edema 08/20/2016   Recurrent UTI 08/15/2016   Psoriasis 04/13/2016   History of diverticulitis 04/13/2016   Osteopenia of multiple sites 04/13/2016   DJD (degenerative joint disease), cervical 04/07/2016   Primary osteoarthritis of both knees 04/07/2016   Paroxysmal atrial fibrillation ([place]) 05/12/2015   NSVT (nonsustained ventricular tachycardia) ([place]) 05/12/2015   Abnormal nuclear stress test - INTERMEDIATE RISK 04/14/2015   Coronary artery calcification seen on CAT scan 02/28/2015   Hyperglycemia 08/09/2014   Dyspnea 25/63/8937   Umbilical hernia 34/28/7681   Pleural effusion 05/10/2012   Myasthenia gravis without exacerbation ([place]) 02/12/2010   Vitamin D deficiency 06/25/2008   Hyperlipidemia 06/25/2008   Essential hypertension 06/25/2008   Leukocytoclastic vasculitis ([place]) 06/19/2007   ANA POSITIVE, HX OF  01/24/2007    Past Medical History:  Diagnosis Date   Allergy    Aortic atherosclerosis (HCC)    Atrial tachycardia ([place]) 05/12/2015   Cataract    Colovesical fistula    Diverticulitis    Diverticulosis    DJD (degenerative joint disease), cervical    Frequent UTI    GERD (gastroesophageal reflux disease)    History of hiatal hernia    Small hernia   Hypertension    Internal hemorrhoids 01/21/2017   noted on colonopscopy   Leukocytoclastic vasculitis (HCC)    Myalgia and myositis, unspecified    Myasthenia gravis without exacerbation (HCC)    [facility], Dr Anna Genre   NSVT (nonsustained ventricular tachycardia) ([place]) 05/12/2015   Osteoarthritis    Both knees   Osteopenia    Other and unspecified hyperlipidemia    Periumbilical hernia    Pneumonia 04/2016   PONV (postoperative nausea and vomiting)    Psoriasis    plantar ;Dr Jarome Matin   Sleep difficulties    Vasculitis ([place])    Dr Estanislado Pandy   Vitamin D deficiency     Family History  Problem Relation Age of Onset   Heart attack Mother 46   Heart failure Mother    Stroke Paternal Grandmother        in 89s   Lung cancer Father    Cancer Father        lung   Hyperthyroidism Sister        Junette Bernat   Hypertension Sister    Diabetes Neg Hx    Colon cancer Neg Hx    Esophageal cancer Neg Hx    Stomach cancer Neg Hx    Rectal cancer Neg Hx    Past Surgical History:  Procedure Laterality  Date   APPENDECTOMY     & exploratory for inflammation; Dr Hassell Done   CARDIAC CATHETERIZATION N/A 04/17/2015   Procedure: Left Heart Cath and Coronary Angiography;  Surgeon: Leonie Man, MD;  Location: [place];  Service: Cardiovascular;  Laterality: N/A;   CERVICAL FUSION     C5-6; Dr Vertell Limber   CHOLECYSTECTOMY     for stones   COLONOSCOPY W/ POLYPECTOMY  01/21/2017   CYSTOSCOPY WITH STENT PLACEMENT Bilateral 03/18/2017   Procedure: CYSTOSCOPY WITH RETROGRADE AND BILATERAL URETERAL STENT  PLACEMENT;  Surgeon: Lucas Mallow, MD;  [facility];  Service: Urology;  Laterality: Bilateral;   HERNIA REPAIR     IR RADIOLOGIST EVAL & MGMT  11/16/2016   IR RADIOLOGIST EVAL & MGMT  12/08/2016   IR RADIOLOGIST EVAL & MGMT  12/16/2016   LAMINECTOMY  2010   T1-2; Dr Vertell Limber   PARTIAL COLECTOMY N/A 03/18/2017   Procedure: OPEN SIGMOIDECTOMY;  Surgeon: Leighton Ruff, MD;  Location[facility];  Service: General;  Laterality: N/A;   SHOULDER SURGERY     Left & Right   THORACENTESIS     for peripneumonic effusion   TOTAL ABDOMINAL HYSTERECTOMY W/ BILATERAL SALPINGOOPHORECTOMY     For Fibroids & endometriosis   TOTAL SHOULDER ARTHROPLASTY     UMBILICAL HERNIA REPAIR N/A 03/18/2017   Procedure: OPEN HERNIA REPAIR UMBILICAL ADULT;  Surgeon: Leighton Ruff, MD;  [facility];  Service: General;  Laterality: N/A;  ERAs pathway   Social History   Social History Narrative   Lives with husband in a one story home.  Had one child that only lived for 14 hours.     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          Epworth Sleepiness Scale = 2 (as of 03/14/2015)   Immunization History  Administered Date(s) Administered   Influenza, High Dose Seasonal PF 09/11/2012, 11/01/2016, 11/24/2017   Influenza,inj,Quad PF,6+ Mos 10/12/2014   Influenza-Unspecified 10/11/2013, 10/24/2015   Pneumococcal Conjugate-13 05/09/2015   Pneumococcal Polysaccharide-23 04/19/2013     Objective: Vital Signs: There were no vitals taken for this visit.   Physical Exam   Musculoskeletal Exam: ***  CDAI Exam: CDAI Score: -- Patient Global: --; Provider Global: -- Swollen: --; Tender: -- Joint Exam 01/02/2020   No joint exam has been documented for this visit   There is currently no information documented on the homunculus. Go to the Rheumatology activity and complete the homunculus joint exam.  Investigation: No additional findings.  Imaging: No  results found.  Recent Labs: Lab Results  Component Value Date   WBC 11.5 (H) 03/22/2018   HGB 12.5 03/22/2018   PLT 299 03/22/2018   NA 137 03/22/2018   K 4.1 03/22/2018   CL 103 03/22/2018   CO2 25 03/22/2018   GLUCOSE 95 03/22/2018   BUN 19 03/22/2018   CREATININE 0.90 03/22/2018   BILITOT 0.7 10/12/2016   ALKPHOS 54 10/12/2016   AST 30 10/12/2016   ALT 26 10/12/2016   PROT 6.5 10/12/2016   ALBUMIN 3.1 (L) 10/12/2016   CALCIUM 9.5 03/22/2018   GFRAA >60 03/22/2018    Speciality Comments: No specialty comments available.  Procedures:  No procedures performed Allergies: Azithromycin, Ibuprofen, Levaquin [levofloxacin in d5w], Tetracycline, Isovue [iopamidol], Sulfonamide derivatives, Aleve [naproxen], Tape, Zosyn [piperacillin sod-tazobactam so], Amlodipine besy-benazepril hcl, and Amoxicillin   Assessment / Plan:     Visit Diagnoses: No diagnosis found.  Orders: No orders of the defined types were placed in this encounter.  No orders of the defined types were placed in this encounter.   Face-to-face time spent with patient was *** minutes. Greater than 50% of time was spent in counseling and coordination of care.  Follow-Up Instructions: No follow-ups on file.   Earnestine Mealing, CMA  Note - This record has been created using Editor, commissioning.  Chart creation errors have been sought, but may not always  have been located. Such creation errors do not reflect on  the standard of medical care.

## 2020-01-02 ENCOUNTER — Ambulatory Visit: Payer: Medicare Other | Admitting: Rheumatology

## 2020-01-02 DIAGNOSIS — Z79899 Other long term (current) drug therapy: Secondary | ICD-10-CM

## 2020-01-02 DIAGNOSIS — Z9889 Other specified postprocedural states: Secondary | ICD-10-CM

## 2020-01-02 DIAGNOSIS — M310 Hypersensitivity angiitis: Secondary | ICD-10-CM

## 2020-01-02 DIAGNOSIS — L409 Psoriasis, unspecified: Secondary | ICD-10-CM

## 2020-01-02 DIAGNOSIS — M7061 Trochanteric bursitis, right hip: Secondary | ICD-10-CM

## 2020-01-02 DIAGNOSIS — M8589 Other specified disorders of bone density and structure, multiple sites: Secondary | ICD-10-CM

## 2020-01-02 DIAGNOSIS — M5030 Other cervical disc degeneration, unspecified cervical region: Secondary | ICD-10-CM

## 2020-01-02 DIAGNOSIS — M170 Bilateral primary osteoarthritis of knee: Secondary | ICD-10-CM

## 2020-01-24 ENCOUNTER — Other Ambulatory Visit: Payer: Self-pay | Admitting: Cardiovascular Disease

## 2020-01-31 ENCOUNTER — Other Ambulatory Visit: Payer: Self-pay | Admitting: Internal Medicine

## 2020-01-31 DIAGNOSIS — I10 Essential (primary) hypertension: Secondary | ICD-10-CM

## 2020-02-17 ENCOUNTER — Other Ambulatory Visit: Payer: Self-pay | Admitting: Physician Assistant

## 2020-03-05 ENCOUNTER — Other Ambulatory Visit: Payer: Self-pay | Admitting: Internal Medicine

## 2020-03-26 ENCOUNTER — Ambulatory Visit (INDEPENDENT_AMBULATORY_CARE_PROVIDER_SITE_OTHER): Payer: Medicare Other | Admitting: Cardiovascular Disease

## 2020-03-26 ENCOUNTER — Encounter: Payer: Self-pay | Admitting: Cardiovascular Disease

## 2020-03-26 ENCOUNTER — Other Ambulatory Visit: Payer: Self-pay

## 2020-03-26 VITALS — BP 130/72 | HR 79 | Ht 62.0 in | Wt 298.0 lb

## 2020-03-26 DIAGNOSIS — I2510 Atherosclerotic heart disease of native coronary artery without angina pectoris: Secondary | ICD-10-CM

## 2020-03-26 DIAGNOSIS — M310 Hypersensitivity angiitis: Secondary | ICD-10-CM | POA: Diagnosis not present

## 2020-03-26 DIAGNOSIS — I472 Ventricular tachycardia: Secondary | ICD-10-CM | POA: Diagnosis not present

## 2020-03-26 DIAGNOSIS — I10 Essential (primary) hypertension: Secondary | ICD-10-CM | POA: Diagnosis not present

## 2020-03-26 DIAGNOSIS — I480 Paroxysmal atrial fibrillation: Secondary | ICD-10-CM | POA: Diagnosis not present

## 2020-03-26 DIAGNOSIS — I776 Arteritis, unspecified: Secondary | ICD-10-CM

## 2020-03-26 DIAGNOSIS — I4729 Other ventricular tachycardia: Secondary | ICD-10-CM

## 2020-03-26 NOTE — Progress Notes (Signed)
Cardiology Office Note   Date:  03/26/2020   ID:  RYA RAUSCH, DOB Mar 26, 1945, MRN [id number]  PCP:  [name], MD  Cardiologist:  Skeet Latch, MD  Electrophysiologist:  None   Evaluation Performed:  Follow-Up Visit  Chief Complaint:  Atrial fibrillation  History of Present Illness:    Kimberly [name] is a 75 y.o. female with paroxysmal atrial fibrillation, hypertension, coronary artery calcification, myasthenia gravis, R LE lymphedema, recurrent R pleural effusion, hyperlipidemia who presents for follow up. She was initially seen 03/2015 for evaluation of asymptomatic coronary calcifications.  She had a chest CT to follow up on pleural effusions and was noted to have coronary calcification. She was asymptomatic but did not exercise much due to myasthenia.  She had a [employer] 03/2015 that was concerning for inferior and apical ischemia.  She subsequently underwent left heart catheterization 04/2015 that showed normal, but tortuous coronary arteries.  She reported palpitations and had a 30 day event monitor that showed paroxysmal atrial tachycardia and NSVT. She was not started on any nodal agents due to her history of myasthenia.  She had an echo 05/2015 that revealed LVEF 60-65% with normal diastolic function.  [name] was admitted 03/2017 for repair of a colovesicular fistula.  On postoperative day 6 she developed atrial fibrillation with rapid ventricular response.  She received 1 dose of IV metoprolol but was switched to amiodarone given her myasthenia.  She converted back to sinus rhythm on amiodarone and was started on Eliquis.  She followed up with Almyra Deforest, PA, 03/2017 and reported dizziness and nausea that was thought to be due to the amiodarone.  Given that her atrial for relation was thought to be postoperative amiodarone was decreased with plans to discontinue it.  She did not have any recurrent atrial fibrillation after that time.  She again wore a 30-day  event monitor that was without atrial fibrillation. She had an episode of afib 03/2018 and was seen in the atrial fibrillation clinic.  She was restarted on Eliquis. She was started on flecainide to be taken as needed.    Since her last appointment she had an episode of jaw pain and cramping in her rib cage.  It occurred when she was in atrial fibrillation.  The episode lasted 2.5 hours and her heart was very irregular.  She had a fever of 101 at the time and a dry cough.  She didn't take flecainide. She isn't able to be active anymore due to joint pain.  She also notes that she has gained weight.  Her diet has been poor.  Her sister recently moved in with them and her diets have been very different.  She has not had any orthopnea or PND and her lower extremity edema has been stable.  She denies any chest pain other than when she has atrial fibrillation.   Past Medical History:  Diagnosis Date  . Allergy   . Aortic atherosclerosis ([place])   . Atrial tachycardia ([place]) 05/12/2015  . Cataract   . Colovesical fistula   . Diverticulitis   . Diverticulosis   . DJD (degenerative joint disease), cervical   . Frequent UTI   . GERD (gastroesophageal reflux disease)   . History of hiatal hernia    Small hernia  . Hypertension   . Internal hemorrhoids 01/21/2017   noted on colonopscopy  . Leukocytoclastic vasculitis ([place])   . Myalgia and myositis, unspecified   . Myasthenia gravis without exacerbation ([place])  [facility], Dr Anna Genre  . NSVT (nonsustained ventricular tachycardia) ([place]) 05/12/2015  . Osteoarthritis    Both knees  . Osteopenia   . Other and unspecified hyperlipidemia   . Periumbilical hernia   . Pneumonia 04/2016  . PONV (postoperative nausea and vomiting)   . Psoriasis    plantar ;Dr Jarome Matin  . Sleep difficulties   . Vasculitis ([place])    Dr Estanislado Pandy  . Vitamin D deficiency    Past Surgical History:  Procedure Laterality Date  . APPENDECTOMY     & exploratory for inflammation; Dr  Hassell Done  . CARDIAC CATHETERIZATION N/A 04/17/2015   Procedure: Left Heart Cath and Coronary Angiography;  Surgeon: Leonie Man, MD;  Location: [facility];  Service: Cardiovascular;  Laterality: N/A;  . CERVICAL FUSION     C5-6; Dr Vertell Limber  . CHOLECYSTECTOMY     for stones  . COLONOSCOPY W/ POLYPECTOMY  01/21/2017  . CYSTOSCOPY WITH STENT PLACEMENT Bilateral 03/18/2017   Procedure: CYSTOSCOPY WITH RETROGRADE AND BILATERAL URETERAL STENT PLACEMENT;  Surgeon: Lucas Mallow, MD;  [facility];  Service: Urology;  Laterality: Bilateral;  . HERNIA REPAIR    . IR RADIOLOGIST EVAL & MGMT  11/16/2016  . IR RADIOLOGIST EVAL & MGMT  12/08/2016  . IR RADIOLOGIST EVAL & MGMT  12/16/2016  . LAMINECTOMY  2010   T1-2; Dr Vertell Limber  . PARTIAL COLECTOMY N/A 03/18/2017   Procedure: OPEN SIGMOIDECTOMY;  Surgeon: Leighton Ruff, MD;  [facility];  Service: General;  Laterality: N/A;  . SHOULDER SURGERY     Left & Right  . THORACENTESIS     for peripneumonic effusion  . TOTAL ABDOMINAL HYSTERECTOMY W/ BILATERAL SALPINGOOPHORECTOMY     For Fibroids & endometriosis  . TOTAL SHOULDER ARTHROPLASTY    . UMBILICAL HERNIA REPAIR N/A 03/18/2017   Procedure: OPEN HERNIA REPAIR UMBILICAL ADULT;  Surgeon: Leighton Ruff, MD;  [facility];  Service: General;  Laterality: N/A;  ERAs pathway     Current Meds  Medication Sig  . CALCIUM PO Take 1,000 mg by mouth at bedtime.  . cholecalciferol (VITAMIN D) 1000 units tablet Take 1,000 Units by mouth at bedtime.  Marland Kitchen ELIQUIS 5 MG TABS tablet TAKE 1 TABLET BY MOUTH TWICE A DAY APPOINTMENT REQUIRED FOR FURTHER REFILLS [phone]  . flecainide (TAMBOCOR) 150 MG tablet Take 1 tablet (150 mg total) by mouth as needed (for AFIB).  . furosemide (LASIX) 40 MG tablet TAKE 1/2 TABLET BY MOUTH EVERY DAY  . losartan (COZAAR) 100 MG tablet TAKE 1 TABLET BY MOUTH EVERY DAY  . Melatonin 10 MG CAPS Take by mouth.  . Multiple Vitamin (MULTIVITAMIN WITH MINERALS) TABS  tablet Take 1 tablet by mouth at bedtime.  . predniSONE (DELTASONE) 5 MG tablet TAKE 1 TABLET BY MOUTH EVERY DAY WITH BREAKFAST  . pyridostigmine (MESTINON) 60 MG tablet TAKE 1 TABLET BY MOUTH 3 TIMES A DAY  . pyridOXINE (VITAMIN B-6) 100 MG tablet Take 100 mg by mouth at bedtime.  Marland Kitchen spironolactone (ALDACTONE) 25 MG tablet TAKE 1 TABLET BY MOUTH EVERY DAY  . vitamin B-12 (CYANOCOBALAMIN) 1000 MCG tablet Take 1,000 mcg by mouth at bedtime.     Allergies:   Azithromycin, Ibuprofen, Levaquin [levofloxacin in d5w], Tetracycline, Isovue [iopamidol], Sulfonamide derivatives, Aleve [naproxen], Tape, Zosyn [piperacillin sod-tazobactam so], Amlodipine besy-benazepril hcl, and Amoxicillin   Social History   Tobacco Use  . Smoking status: Never Smoker  . Smokeless tobacco: Never Used  Vaping Use  .  Vaping Use: Never used  Substance Use Topics  . Alcohol use: No    Alcohol/week: 0.0 standard drinks  . Drug use: No     Family Hx: The patient's family history includes Cancer in her father; Heart attack (age of onset: 55) in her mother; Heart failure in her mother; Hypertension in her sister; Hyperthyroidism in her sister; Lung cancer in her father; Stroke in her paternal grandmother. There is no history of Diabetes, Colon cancer, Esophageal cancer, Stomach cancer, or Rectal cancer.  ROS:   Please see the history of present illness.     All other systems reviewed and are negative.   Prior CV studies:   The following studies were reviewed today:  Echo 05/26/15: Study Conclusions  - Left ventricle: The cavity size was mildly dilated. Systolic function was normal. The estimated ejection fraction was in the range of 60% to 65%. Wall motion was normal; there were no regional wall motion abnormalities. Left ventricular diastolic function parameters were normal. - Aortic valve: Transvalvular velocity was within the normal range. There was no stenosis. There was no regurgitation. -  Mitral valve: There was no regurgitation. - Left atrium: The atrium was mildly dilated. - Right ventricle: The cavity size was normal. Wall thickness was normal. Systolic function was normal. - Tricuspid valve: There was no regurgitation. - Inferior vena cava: The vessel was normal in size. The respirophasic diameter changes were in the normal range (= 50%), consistent with normal central venous pressure.   28 Day Event Monitor 04/01/15:  Quality: Fair.  Baseline artifact. Predominant rhythm: sinus rhythm Average heart rate: 70 bpm Max heart rate: 126 bpm Min heart rate: 50 bpm  Short runs of atrial tachycardia lasting up to 11 seconds.  One 6 beat run of NSVT.   Lexiscan Cardiolite 03/28/15: 1. Nuclear stress EF: 71%. 2. The left ventricular ejection fraction is hyperdynamic (>65%). 3. There was no ST segment deviation noted during stress. 4. This is an intermediate risk study. 5. Findings consistent with ischemia.  Technically difficult study due to increased subdiaphragmatic activity; intermediate risk with small, severe, reversible defects in the apical and inferior basal walls consistent with ischemia; EF 71 with normal wall motion.  LHC 04/17/15: 6. The left ventricular systolic function is normal. 7. Angiographically normal coronary arteries, but very tortuous   Angiographically no evidence of any significant lesions to explain the patient's abnormal stress test. There does appear to be mild calcification in the LAD distribution, but certainly not on the luminal aspect.  Labs/Other Tests and Data Reviewed:    EKG:  An ECG dated 03/22/18 was personally reviewed today and demonstrated:  sinus rhythm.  Rate 77 bpm.  Non-specific ST-T changes  03/26/2020: Sinus rhythm.  Rate 79 bpm.  Nonspecific T wave abnormalities.  Recent Labs: No results found for requested labs within last 8760 hours.   Recent Lipid Panel Lab Results  Component Value Date/Time   CHOL  256 (H) 08/09/2014 09:28 AM   TRIG 180 (H) 08/09/2014 09:28 AM   HDL 74 08/09/2014 09:28 AM   LDLCALC 146 (H) 08/09/2014 09:28 AM    Wt Readings from Last 3 Encounters:  03/26/20 298 lb (135.2 kg)  09/19/19 294 lb (133.4 kg)  02/06/19 289 lb (131.1 kg)     Objective:    VS:  BP 130/72   Pulse 79   Ht _0  (1.575 m)   Wt 298 lb (135.2 kg)   SpO2 92%   BMI 54.50 kg/m  ,  BMI Body mass index is 54.5 kg/m. GENERAL:  Well appearing HEENT: Pupils equal round and reactive, fundi not visualized, oral mucosa unremarkable NECK:  No jugular venous distention, waveform within normal limits, carotid upstroke brisk and symmetric, no bruits, no thyromegaly LYMPHATICS:  No cervical adenopathy LUNGS:  Clear to auscultation bilaterally HEART:  RRR.  PMI not displaced or sustained,S1 and S2 within normal limits, no S3, no S4, no clicks, no rubs, no murmurs ABD:  Central adiposity.  positive bowel sounds normal in frequency in pitch, no bruits, no rebound, no guarding, no midline pulsatile mass, no hepatomegaly, no splenomegaly EXT:  2 plus pulses throughout, 1+ LE edema, no cyanosis no clubbing SKIN:  No rashes no nodules NEURO:  Cranial nerves II through XII grossly intact, motor grossly intact throughout PSYCH:  Cognitively intact, oriented to person place and time   ASSESSMENT & PLAN:    # PAF: Noted postoperatively.  She continues to have brief episodes of atrial fibrillation.  She has not required any flecainide.  We did discuss using it more on a regular basis but she would like to continue taking it only as needed.  Continue Eliquis.  Check CBC  # Coronary calcification: # Abnormal stress: No significant CAD on cath. She denies chest pain. Continue aspirin. Avoid statins or nodal agents due to her history of Myasthenia.  Her PCP follows her lipids.  LDL goal <70.  # Hypertension: BP well-controlled on losartan, spironolactone, and furosemide.  Check a CMP today.  # Paroxysmal  atrial tachycardia, NSVT: 30 day event monitor showed runs of atrial tachycardia as well as one run of nonsustained ventricular tachycardia. Since her last appointment she had one episode.  No nodal agents 2/2 Myasthenia.  No ischmia or underlying structural heart disease.  # Vasculitis:   She has a history of leukocytoclastic vasculitis.  She has many rheumatologic labs pending.  She has not had many symptoms lately.  We will check an ESR and a sed rate.  She will follow up with rheumatology for any additional laboratory testing that is needed.   Medication Adjustments/Labs and Tests Ordered: Current medicines are reviewed at length with the patient today.  Concerns regarding medicines are outlined above.   Tests Ordered: Orders Placed This Encounter  Procedures  . CBC with Differential/Platelet  . Comprehensive metabolic panel  . ANA  . Sed Rate (ESR)  . EKG 12-Lead    Medication Changes: No orders of the defined types were placed in this encounter.   Follow Up: with Jacorion Klem C. Oval Linsey, MD, [facility] in 6 months.  Signed, Skeet Latch, MD  03/26/2020 4:41 PM    [facility]

## 2020-03-26 NOTE — Patient Instructions (Signed)
Medication Instructions:  Your physician recommends that you continue on your current medications as directed. Please refer to the Current Medication list given to you today.   *If you need a refill on your cardiac medications before your next appointment, please call your pharmacy*  Lab Work: CMET/CBC/ANA/SED RATE TODAY   If you have labs (blood work) drawn today and your tests are completely normal, you will receive your results only by: Marland Kitchen MyChart Message (if you have MyChart) OR . A paper copy in the mail If you have any lab test that is abnormal or we need to change your treatment, we will call you to review the results.  Testing/Procedures: NONE  Follow-Up: At [facility], you and your health needs are our priority.  As part of our continuing mission to provide you with exceptional heart care, we have created designated Provider Care Teams.  These Care Teams include your primary Cardiologist (physician) and Advanced Practice Providers (APPs -  Physician Assistants and Nurse Practitioners) who all work together to provide you with the care you need, when you need it.  We recommend signing up for the patient portal called "MyChart".  Sign up information is provided on this After Visit Summary.  MyChart is used to connect with patients for Virtual Visits (Telemedicine).  Patients are able to view lab/test results, encounter notes, upcoming appointments, etc.  Non-urgent messages can be sent to your provider as well.   To learn more about what you can do with MyChart, go to [email or web address].    Your next appointment:   6 month(s)  The format for your next appointment:   In Person  Provider:   Skeet Latch, MD

## 2020-03-27 LAB — CBC WITH DIFFERENTIAL/PLATELET
Basophils Absolute: 0.1 10*3/uL (ref 0.0–0.2)
Basos: 0 %
EOS (ABSOLUTE): 0 10*3/uL (ref 0.0–0.4)
Eos: 0 %
Hematocrit: 39.3 % (ref 34.0–46.6)
Hemoglobin: 13.4 g/dL (ref 11.1–15.9)
Immature Grans (Abs): 0 10*3/uL (ref 0.0–0.1)
Immature Granulocytes: 0 %
Lymphocytes Absolute: 0.9 10*3/uL (ref 0.7–3.1)
Lymphs: 7 %
MCH: 30.7 pg (ref 26.6–33.0)
MCHC: 34.1 g/dL (ref 31.5–35.7)
MCV: 90 fL (ref 79–97)
Monocytes Absolute: 0.8 10*3/uL (ref 0.1–0.9)
Monocytes: 7 %
Neutrophils Absolute: 10.4 10*3/uL — ABNORMAL HIGH (ref 1.4–7.0)
Neutrophils: 86 %
Platelets: 330 10*3/uL (ref 150–450)
RBC: 4.37 x10E6/uL (ref 3.77–5.28)
RDW: 13.3 % (ref 11.7–15.4)
WBC: 12.3 10*3/uL — ABNORMAL HIGH (ref 3.4–10.8)

## 2020-03-27 LAB — COMPREHENSIVE METABOLIC PANEL
ALT: 21 IU/L (ref 0–32)
AST: 28 IU/L (ref 0–40)
Albumin/Globulin Ratio: 1.6 (ref 1.2–2.2)
Albumin: 4.2 g/dL (ref 3.7–4.7)
Alkaline Phosphatase: 66 IU/L (ref 44–121)
BUN/Creatinine Ratio: 19 (ref 12–28)
BUN: 17 mg/dL (ref 8–27)
Bilirubin Total: 0.5 mg/dL (ref 0.0–1.2)
CO2: 22 mmol/L (ref 20–29)
Calcium: 9.6 mg/dL (ref 8.7–10.3)
Chloride: 100 mmol/L (ref 96–106)
Creatinine, Ser: 0.91 mg/dL (ref 0.57–1.00)
Globulin, Total: 2.7 g/dL (ref 1.5–4.5)
Glucose: 102 mg/dL — ABNORMAL HIGH (ref 65–99)
Potassium: 4.4 mmol/L (ref 3.5–5.2)
Sodium: 140 mmol/L (ref 134–144)
Total Protein: 6.9 g/dL (ref 6.0–8.5)
eGFR: 67 mL/min/{1.73_m2} (ref >59)

## 2020-03-27 LAB — ANA: Anti Nuclear Antibody (ANA): POSITIVE — AB

## 2020-03-27 LAB — SEDIMENTATION RATE: Sed Rate: 21 mm/hr (ref 0–40)

## 2020-03-29 ENCOUNTER — Other Ambulatory Visit: Payer: Self-pay | Admitting: Internal Medicine

## 2020-04-02 ENCOUNTER — Other Ambulatory Visit: Payer: Self-pay | Admitting: Internal Medicine

## 2020-04-08 ENCOUNTER — Other Ambulatory Visit: Payer: Self-pay

## 2020-04-08 ENCOUNTER — Ambulatory Visit (INDEPENDENT_AMBULATORY_CARE_PROVIDER_SITE_OTHER): Payer: Medicare Other | Admitting: Pharmacist

## 2020-04-08 DIAGNOSIS — I480 Paroxysmal atrial fibrillation: Secondary | ICD-10-CM | POA: Diagnosis not present

## 2020-04-08 DIAGNOSIS — I10 Essential (primary) hypertension: Secondary | ICD-10-CM

## 2020-04-08 DIAGNOSIS — M170 Bilateral primary osteoarthritis of knee: Secondary | ICD-10-CM | POA: Diagnosis not present

## 2020-04-08 DIAGNOSIS — E785 Hyperlipidemia, unspecified: Secondary | ICD-10-CM

## 2020-04-08 NOTE — Progress Notes (Signed)
 Chronic Care Management Pharmacy Note  04/08/2020 Name:  Kimberly [name] MRN:  [id number] DOB:  11/07/1945  Subjective: Kimberly [name] is an 74 y.o. year old female who is a primary patient of Burns, Stacy J, MD.  The CCM team was consulted for assistance with disease management and care coordination needs.    Engaged with patient by telephone for follow up visit in response to provider referral for pharmacy case management and/or care coordination services.   Consent to Services:  The patient was given the following information about Chronic Care Management services today, agreed to services, and gave verbal consent: 1. CCM service includes personalized support from designated clinical staff supervised by the primary care provider, including individualized plan of care and coordination with other care providers 2. 24/7 contact phone numbers for assistance for urgent and routine care needs. 3. Service will only be billed when office clinical staff spend 20 minutes or more in a month to coordinate care. 4. Only one practitioner may furnish and bill the service in a calendar month. 5.The patient may stop CCM services at any time (effective at the end of the month) by phone call to the office staff. 6. The patient will be responsible for cost sharing (co-pay) of up to 20% of the service fee (after annual deductible is met). Patient agreed to services and consent obtained.  Patient Care Team: Burns, Stacy J, MD as PCP - General (Internal Medicine) Pine, Tiffany, MD as PCP - Cardiology (Cardiology) ,  N, RPH as Pharmacist (Pharmacist)  Recent office visits: 06/12/19 Dr Burns VV  Recent consult visits: 03/26/20 Dr Randalia (cardiology): using flecainide PRN. No significant CAD on cath. Avoid statins or nodal agents d/t hx myasthenia.    Patient lives with husband of 52 years, both retired now. Husband is active, she needs walker to do most things - knee trouble, but wants  to avoid surgery. Hospital visits: None in previous 6 months  Objective:  Lab Results  Component Value Date   CREATININE 0.91 03/26/2020   BUN 17 03/26/2020   GFR 58.27 (L) 08/20/2016   GFRNONAA >60 03/22/2018   GFRAA >60 03/22/2018   NA 140 03/26/2020   K 4.4 03/26/2020   CALCIUM 9.6 03/26/2020   CO2 22 03/26/2020   GLUCOSE 102 (H) 03/26/2020    Lab Results  Component Value Date/Time   HGBA1C 5.3 08/09/2014 09:28 AM   GFR 58.27 (L) 08/20/2016 11:14 AM   GFR 89.82 02/17/2015 04:16 PM    Last diabetic Eye exam: No results found for: HMDIABEYEEXA  Last diabetic Foot exam: No results found for: HMDIABFOOTEX   Lab Results  Component Value Date   CHOL 256 (H) 08/09/2014   HDL 74 08/09/2014   LDLCALC 146 (H) 08/09/2014   TRIG 180 (H) 08/09/2014    Hepatic Function Latest Ref Rng & Units 03/26/2020 10/12/2016 08/20/2016  Total Protein 6.0 - 8.5 g/dL 6.9 6.5 7.6  Albumin 3.7 - 4.7 g/dL 4.2 3.1(L) 3.9  AST 0 - 40 IU/L 28 30 16  ALT 0 - 32 IU/L 21 26 15  Alk Phosphatase 44 - 121 IU/L 66 54 64  Total Bilirubin 0.0 - 1.2 mg/dL 0.5 0.7 0.4  Bilirubin, Direct 0.0 - 0.3 mg/dL - - -    Lab Results  Component Value Date/Time   TSH 2.838 03/22/2018 03:30 PM   TSH 3.152 03/24/2017 09:22 PM   TSH 2.20 08/09/2014 09:28 AM   TSH 1.78 01/24/2007 11:23 AM      CBC Latest Ref Rng & Units 03/26/2020 03/22/2018 04/08/2017  WBC 3.4 - 10.8 x10E3/uL 12.3(H) 11.5(H) 9.6  Hemoglobin 11.1 - 15.9 g/dL 13.4 12.5 9.8(L)  Hematocrit 34.0 - 46.6 % 39.3 39.6 30.5(L)  Platelets 150 - 450 x10E3/uL 330 299 481(H)    No results found for: VD25OH  Clinical ASCVD: No  The ASCVD Risk score Mikey Bussing DC Jr., et al., 2013) failed to calculate for the following reasons:   Cannot find a previous HDL lab   Cannot find a previous total cholesterol lab    Depression screen [facility] 2/9 05/21/2016 05/09/2015  Decreased Interest 0 0  Down, Depressed, Hopeless 0 0  PHQ - 2 Score 0 0  Some recent data might be hidden      CHA2DS2-VASc Score = 4  The patient's score is based upon: CHF History: No HTN History: Yes Diabetes History: No Stroke History: No Vascular Disease History: Yes Age Score: 1 Gender Score: 1      Social History   Tobacco Use  Smoking Status Never Smoker  Smokeless Tobacco Never Used   BP Readings from Last 3 Encounters:  03/26/20 130/72  09/19/19 138/74  02/06/19 127/74   Pulse Readings from Last 3 Encounters:  03/26/20 79  09/19/19 72  02/06/19 76   Wt Readings from Last 3 Encounters:  03/26/20 298 lb (135.2 kg)  09/19/19 294 lb (133.4 kg)  02/06/19 289 lb (131.1 kg)   BMI Readings from Last 3 Encounters:  03/26/20 54.50 kg/m  09/19/19 53.77 kg/m  02/06/19 52.86 kg/m    Assessment/Interventions: Review of patient past medical history, allergies, medications, health status, including review of consultants reports, laboratory and other test data, was performed as part of comprehensive evaluation and provision of chronic care management services.   SDOH:  (Social Determinants of Health) assessments and interventions performed: Yes  SDOH Screenings   Alcohol Screen: Not on file  Depression (JQB3-4): Not on file  Financial Resource Strain: Low Risk   . Difficulty of Paying Living Expenses: Not very hard  Food Insecurity: Not on file  Housing: Not on file  Physical Activity: Not on file  Social Connections: Not on file  Stress: Not on file  Tobacco Use: Low Risk   . Smoking Tobacco Use: Never Smoker  . Smokeless Tobacco Use: Never Used  Transportation Needs: Not on file    CCM Care Plan  Allergies  Allergen Reactions  . Azithromycin Other (See Comments)    Angioedema  . Ibuprofen     800 mg - edema all over, including lips and tongue  . Levaquin [Levofloxacin In D5w] Other (See Comments)    D/T MYASTHENIA GRAVIS  . Tetracycline Rash  . Isovue [Iopamidol] Dermatitis    Pt had a rash and blisters on bilat feet and lower part of legs.  Full  premeds in the future.  This happened twice after IV contrast.   J Bohm  RTRCT  . Sulfonamide Derivatives     Seizure age 40 (Note: probably fever induced)  . Aleve [Naproxen] Other (See Comments)    Caused excessive salivation, throat tightness  . Tape Other (See Comments)    Causes blisters  Paper tape only  . Zosyn [Piperacillin Sod-Tazobactam So]     Severe leg rash, skin peeling  . Amlodipine Besy-Benazepril Hcl Cough  . Amoxicillin Swelling and Rash    Has patient had a PCN reaction causing immediate rash, facial/tongue/throat swelling, SOB or lightheadedness with hypotension: No Has patient had a PCN reaction  causing severe rash involving mucus membranes or skin necrosis: No Has patient had a PCN reaction that required hospitalization: No Has patient had a PCN reaction occurring within the last 10 years: Yes If all of the above answers are "NO", then may proceed with Cephalosporin use.     Medications Reviewed Today    Reviewed by ,  N, RPH (Pharmacist) on 04/08/20 at 1427  Med List Status: <None>  Medication Order Taking? Sig Documenting Provider Last Dose Status Informant  CALCIUM PO [id number] Yes Take 1,000 mg by mouth at bedtime. [provider] Taking Active Self  cholecalciferol (VITAMIN D) 1000 units tablet [id number] Yes Take 1,000 Units by mouth at bedtime. [provider] Taking Active Self  ELIQUIS 5 MG TABS tablet [id number] Yes TAKE 1 TABLET BY MOUTH TWICE A DAY APPOINTMENT REQUIRED FOR FURTHER REFILLS [phone] Colona, Tiffany, MD Taking Active   flecainide (TAMBOCOR) 150 MG tablet [id number] Yes Take 1 tablet (150 mg total) by mouth as needed (for AFIB). [place], Tiffany, MD Taking Active   furosemide (LASIX) 40 MG tablet [id number] Yes TAKE 1/2 TABLET BY MOUTH EVERY DAY Burns, Stacy J, MD Taking Active   losartan (COZAAR) 100 MG tablet [id number] Yes TAKE 1 TABLET BY MOUTH EVERY DAY Burns, Stacy J, MD Taking Active   Melatonin 10  MG CAPS [id number] Yes Take by mouth. [provider] Taking Active   Multiple Vitamin (MULTIVITAMIN WITH MINERALS) TABS tablet [id number] Yes Take 1 tablet by mouth at bedtime. [provider] Taking Active Self  predniSONE (DELTASONE) 5 MG tablet [id number] Yes TAKE 1 TABLET BY MOUTH EVERY DAY WITH BREAKFAST Dale, Taylor M, PA-C Taking Active   pyridostigmine (MESTINON) 60 MG tablet [id number] Yes TAKE 1 TABLET BY MOUTH 3 TIMES A DAY Patel, Donika K, DO Taking Active   pyridOXINE (VITAMIN B-6) 100 MG tablet [id number] Yes Take 100 mg by mouth at bedtime. [provider] Taking Active Self  spironolactone (ALDACTONE) 25 MG tablet [id number] Yes TAKE 1 TABLET BY MOUTH EVERY DAY Ainsworth, Tiffany, MD Taking Active   vitamin B-12 (CYANOCOBALAMIN) 1000 MCG tablet [id number] Yes Take 1,000 mcg by mouth at bedtime. [provider] Taking Active Self          Patient Active Problem List   Diagnosis Date Noted  . Diarrhea 03/10/2018  . Diverticular stricture (HCC) 03/18/2017  . Sleep difficulties 10/31/2016  . Bilateral leg edema 08/20/2016  . Recurrent UTI 08/15/2016  . Psoriasis 04/13/2016  . History of diverticulitis 04/13/2016  . Osteopenia of multiple sites 04/13/2016  . DJD (degenerative joint disease), cervical 04/07/2016  . Primary osteoarthritis of both knees 04/07/2016  . Paroxysmal atrial fibrillation (HCC) 05/12/2015  . NSVT (nonsustained ventricular tachycardia) (HCC) 05/12/2015  . Abnormal nuclear stress test - INTERMEDIATE RISK 04/14/2015  . Coronary artery calcification seen on CAT scan 02/28/2015  . Hyperglycemia 08/09/2014  . Dyspnea 03/20/2013  . Umbilical hernia 10/20/2012  . Pleural effusion 05/10/2012  . Myasthenia gravis without exacerbation (HCC) 02/12/2010  . Vitamin D deficiency 06/25/2008  . Hyperlipidemia 06/25/2008  . Essential hypertension 06/25/2008  . Leukocytoclastic vasculitis (HCC) 06/19/2007  . ANA POSITIVE, HX OF  01/24/2007    Immunization History  Administered Date(s) Administered  . Influenza, High Dose Seasonal PF 09/11/2012, 11/01/2016, 11/24/2017  . Influenza,inj,Quad PF,6+ Mos 10/12/2014  . Influenza-Unspecified 10/11/2013, 10/24/2015  . Pneumococcal Conjugate-13 05/09/2015  . Pneumococcal Polysaccharide-23 04/19/2013    Conditions to be addressed/monitored:  Hypertension, Hyperlipidemia, Atrial Fibrillation and Osteoarthritis  Care Plan :   CCM Pharmacy Care Plan  Updates made by ,  N, RPH since 04/08/2020 12:00 AM    Problem: Hypertension, Hyperlipidemia, Atrial Fibrillation and Osteoarthritis   Priority: High    Long-Range Goal: Disease management   Start Date: 04/08/2020  Expected End Date: 10/09/2020  This Visit's Progress: On track  Priority: High  Note:   Current Barriers:  . Unable to independently monitor therapeutic efficacy  Pharmacist Clinical Goal(s):  . Patient will achieve adherence to monitoring guidelines and medication adherence to achieve therapeutic efficacy through collaboration with PharmD and provider.   Interventions: . 1:1 collaboration with Burns, Stacy J, MD regarding development and update of comprehensive plan of care as evidenced by provider attestation and co-signature . Inter-disciplinary care team collaboration (see longitudinal plan of care) . Comprehensive medication review performed; medication list updated in electronic medical record   AFIB/Hypertension    BP goal < 140/90. HR goal <110 Patient is currently rate controlled. Patient checks BP at home daily Patient home BP readings are ranging: 130s/70s   Patient has failed these meds in the past: n/a Patient is currently controlled on the following medications:   Losartan 100 mg daily  Spironolactone 25 mg daily  Furosemide 40 mg - 1/2 tab daily  Flecainide 150 mg as needed  Eliquis 5 mg BID   We discussed: pt has not required flecainide for Afib episodes; she  denies issues with bleeding;    Plan: Continue current medications and control with diet and exercise    Hyperlipidemia    LDL goal < 70 Patient has failed these meds in past: n/a Patient is currently uncontrolled on the following medications: no medications - avoiding statins due to myasthenia gravis   We discussed:  pt is overdue for lipid panel, this was not ordered with other labs at cardiology visit this month; advised pt to set up PCP visit   Plan: Continue control with diet and exercise    Osteoarthritis    Patient has failed these meds in past: ibuprofen, naproxen, tylenol, Voltaren gel (ineffective) Patient is currently uncontrolled on the following medications:   Tylenol 500-1000 mg q8h PRN   We discussed: Pt is using Tylenol sparingly, avoiding NSAIDs due to Eliquis   Plan: Continue current medication  Patient Goals/Self-Care Activities . Patient will:  - take medications as prescribed focus on medication adherence by pill box check blood pressure weekly', document, and provide at future appointments  Follow Up Plan: Telephone follow up appointment with care management team member scheduled for: 6 months      Medication Assistance: None required.  Patient affirms current coverage meets needs.  Patient's preferred pharmacy is:  [id number] - [place], [place] - [address] [place] [address] Phone: [phone] Fax: [phone]  Uses pill box? Yes Pt endorses 100% compliance  We discussed: Current pharmacy is preferred with insurance plan and patient is satisfied with pharmacy services Patient decided to: Continue current medication management strategy  Care Plan and Follow Up Patient Decision:  Patient agrees to Care Plan and Follow-up.  Plan: Telephone follow up appointment with care management team member scheduled for:  6 months  Advised patient to set up in-person PCP visit at  earliest convenience    , PharmD, BCACP, CPP Clinical Pharmacist [facility] [phone] 

## 2020-04-08 NOTE — Patient Instructions (Signed)
Visit Information  Phone number for Pharmacist: [phone]  Goals Addressed   None    Patient Care Plan: CCM Pharmacy Care Plan    Problem Identified: Hypertension, Hyperlipidemia, Atrial Fibrillation and Osteoarthritis   Priority: High    Long-Range Goal: Disease management   Start Date: 04/08/2020  Expected End Date: 10/09/2020  This Visit's Progress: On track  Priority: High  Note:   Current Barriers:  . Unable to independently monitor therapeutic efficacy  Pharmacist Clinical Goal(s):  Marland Kitchen Patient will achieve adherence to monitoring guidelines and medication adherence to achieve therapeutic efficacy through collaboration with PharmD and provider.   Interventions: . 1:1 collaboration with [name], MD regarding development and update of comprehensive plan of care as evidenced by provider attestation and co-signature . Inter-disciplinary care team collaboration (see longitudinal plan of care) . Comprehensive medication review performed; medication list updated in electronic medical record   AFIB/Hypertension    BP goal < 140/90. HR goal <110 Patient is currently rate controlled. Patient checks BP at home daily Patient home BP readings are ranging: 130s/70s   Patient has failed these meds in the past: n/a Patient is currently controlled on the following medications:   Losartan 100 mg daily  Spironolactone 25 mg daily  Furosemide 40 mg - 1/2 tab daily  Flecainide 150 mg as needed  Eliquis 5 mg BID   We discussed: pt has not required flecainide for Afib episodes; she denies issues with bleeding;    Plan: Continue current medications and control with diet and exercise    Hyperlipidemia    LDL goal < 70 Patient has failed these meds in past: n/a Patient is currently uncontrolled on the following medications: no medications - avoiding statins due to myasthenia gravis   We discussed:  pt is overdue for lipid panel, this was not ordered with other labs at  cardiology visit this month; advised pt to set up PCP visit   Plan: Continue control with diet and exercise    Osteoarthritis    Patient has failed these meds in past: ibuprofen, naproxen, tylenol, Voltaren gel (ineffective) Patient is currently uncontrolled on the following medications:   Tylenol [phone] mg q8h PRN   We discussed: Pt is using Tylenol sparingly, avoiding NSAIDs due to Eliquis   Plan: Continue current medication  Patient Goals/Self-Care Activities . Patient will:  - take medications as prescribed focus on medication adherence by pill box check blood pressure weekly', document, and provide at future appointments  Follow Up Plan: Telephone follow up appointment with care management team member scheduled for: 6 months     The patient verbalized understanding of instructions, educational materials, and care plan provided today and declined offer to receive copy of patient instructions, educational materials, and care plan.  Telephone follow up appointment with pharmacy team member scheduled for: 6 months  Charlene Brooke, PharmD, [place], CPP Clinical Pharmacist [facility] [phone]

## 2020-04-11 ENCOUNTER — Other Ambulatory Visit: Payer: Self-pay | Admitting: Physician Assistant

## 2020-04-11 ENCOUNTER — Other Ambulatory Visit: Payer: Self-pay | Admitting: Internal Medicine

## 2020-04-11 DIAGNOSIS — I10 Essential (primary) hypertension: Secondary | ICD-10-CM

## 2020-04-14 ENCOUNTER — Other Ambulatory Visit: Payer: Self-pay

## 2020-04-14 ENCOUNTER — Telehealth (INDEPENDENT_AMBULATORY_CARE_PROVIDER_SITE_OTHER): Payer: Medicare Other | Admitting: Internal Medicine

## 2020-04-14 ENCOUNTER — Encounter: Payer: Self-pay | Admitting: Internal Medicine

## 2020-04-14 DIAGNOSIS — J019 Acute sinusitis, unspecified: Secondary | ICD-10-CM | POA: Diagnosis not present

## 2020-04-14 DIAGNOSIS — J349 Unspecified disorder of nose and nasal sinuses: Secondary | ICD-10-CM | POA: Insufficient documentation

## 2020-04-14 MED ORDER — CEPHALEXIN 500 MG PO CAPS: 500.0000 mg | ORAL_CAPSULE | Freq: Three times a day (TID) | ORAL | 0 refills | Status: AC

## 2020-04-14 MED ORDER — HYDROCODONE-HOMATROPINE 5-1.5 MG/5ML PO SYRP: 5.0000 mL | ORAL_SOLUTION | Freq: Four times a day (QID) | ORAL | 0 refills | Status: DC | PRN

## 2020-04-14 NOTE — Assessment & Plan Note (Signed)
Acute Likely bacterial  Start Keflex 500 mg 3 times daily x10 days Hydromet cough syrup 5 mg every 6 hours as needed otc cold medications Rest, fluid Call if no improvement

## 2020-04-14 NOTE — Progress Notes (Signed)
Virtual Visit via Video Note  I connected with Kimberly [name] on 04/14/20 at  3:40 PM EDT by telephone and verified that I am speaking with the correct person using two identifiers.   I discussed the limitations of evaluation and management by telemedicine and the availability of in person appointments. The patient expressed understanding and agreed to proceed.  Present for the visit:  Myself, Dr [name], Westley Hummer.  The patient is currently at home and I am in the office.    No referring provider.    History of Present Illness: She is here for an acute visit for cold symptoms.   Her symptoms started several days ago  She is experiencing fever up to 100.4, chills, fatigue, nasal congestion, intermittent right ear pain, scratchy throat, teeth pain on occasion, cough that is occasionally productive and headaches.  I did a virtual visit with her husband earlier today and he has similar symptoms.  She has tried taking tylenol   Review of Systems  Constitutional: Positive for chills, fever (100.4) and malaise/fatigue.  HENT: Positive for congestion (mild) and ear pain (right ear - occ). Negative for sore throat (scratchy).        Teeth pain on occ  Respiratory: Positive for cough and sputum production. Negative for shortness of breath and wheezing.   Neurological: Positive for headaches.       Social History   Socioeconomic History  . Marital status: Married    Spouse name: Not on file  . Number of children: Not on file  . Years of education: Not on file  . Highest education level: Not on file  Occupational History  . 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 Smoking status: Never Smoker  . Smokeless tobacco: Never Used  Vaping Use  . Vaping Use: Never used  Substance and Sexual Activity  . Alcohol use: No    Alcohol/week: 0.0 standard drinks  . Drug use: No  . Sexual activity: Not Currently  Other Topics Concern  . Not on  file  Social History Narrative   Lives with husband in a one story home.  Had one child that only lived for 14 hours.     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          Epworth Sleepiness Scale = 2 (as of 03/14/2015)   Social Determinants of Health   Financial Resource Strain: Low Risk   . Difficulty of Paying Living Expenses: Not very hard  Food Insecurity: Not on file  Transportation Needs: Not on file  Physical Activity: Not on file  Stress: Not on file  Social Connections: Not on file     Assessment and Plan:  See Problem List for Assessment and Plan of chronic medical problems.   Follow Up Instructions:    I discussed the assessment and treatment plan with the patient. The patient was provided an opportunity to ask questions and all were answered. The patient agreed with the plan and demonstrated an understanding of the instructions.   The patient was advised to call back or seek an in-person evaluation if the symptoms worsen or if the condition fails to improve as anticipated.  Time spent on telephone call -8 minutes  [name], MD

## 2020-04-20 ENCOUNTER — Other Ambulatory Visit (HOSPITAL_COMMUNITY): Payer: Self-pay | Admitting: Internal Medicine

## 2020-04-28 ENCOUNTER — Telehealth: Payer: Self-pay

## 2020-04-28 NOTE — Progress Notes (Addendum)
Chronic Care Management Pharmacy Assistant   Name: Kimberly [name]  MRN: [id number] DOB: 04-12-1945  Reason for Encounter: Disease State - Hypertension   Recent office visits:  04/14/20 video Visit - Kimberly [name], PCP   Recent consult visits:  None noted  Hospital visits:  None in previous 6 months  Medications: Outpatient Encounter Medications as of 04/28/2020  Medication Sig   CALCIUM PO Take 1,000 mg by mouth at bedtime.   cholecalciferol (VITAMIN D) 1000 units tablet Take 1,000 Units by mouth at bedtime.   ELIQUIS 5 MG TABS tablet TAKE 1 TABLET BY MOUTH TWICE A DAY APPOINTMENT REQUIRED FOR FURTHER REFILLS [phone]   flecainide (TAMBOCOR) 150 MG tablet Take 1 tablet (150 mg total) by mouth as needed (for AFIB).   furosemide (LASIX) 40 MG tablet TAKE 1/2 TABLET BY MOUTH EVERY DAY   HYDROcodone-homatropine (HYDROMET) 5-1.5 MG/5ML syrup Take 5 mLs by mouth every 6 (six) hours as needed for cough.   losartan (COZAAR) 100 MG tablet TAKE 1 TABLET BY MOUTH EVERY DAY   Melatonin 10 MG CAPS Take by mouth.   Multiple Vitamin (MULTIVITAMIN WITH MINERALS) TABS tablet Take 1 tablet by mouth at bedtime.   predniSONE (DELTASONE) 5 MG tablet TAKE 1 TABLET BY MOUTH EVERY DAY WITH BREAKFAST   pyridostigmine (MESTINON) 60 MG tablet TAKE 1 TABLET BY MOUTH 3 TIMES A DAY   pyridOXINE (VITAMIN B-6) 100 MG tablet Take 100 mg by mouth at bedtime.   spironolactone (ALDACTONE) 25 MG tablet TAKE 1 TABLET BY MOUTH EVERY DAY   vitamin B-12 (CYANOCOBALAMIN) 1000 MCG tablet Take 1,000 mcg by mouth at bedtime.   No facility-administered encounter medications on file as of 04/28/2020.    Reviewed chart prior to disease state call. Spoke with patient regarding BP  Recent Office Vitals: BP Readings from Last 3 Encounters:  03/26/20 130/72  09/19/19 138/74  02/06/19 127/74   Pulse Readings from Last 3 Encounters:  03/26/20 79  09/19/19 72  02/06/19 76    Wt Readings from Last 3 Encounters:   03/26/20 298 lb (135.2 kg)  09/19/19 294 lb (133.4 kg)  02/06/19 289 lb (131.1 kg)     Kidney Function Lab Results  Component Value Date/Time   CREATININE 0.91 03/26/2020 03:10 PM   CREATININE 0.90 03/22/2018 03:30 PM   CREATININE 1.16 (H) 04/15/2016 03:28 PM   CREATININE 1.17 (H) 04/11/2015 04:10 PM   GFR 58.27 (L) 08/20/2016 11:14 AM   GFRNONAA >60 03/22/2018 03:30 PM   GFRNONAA 48 (L) 04/15/2016 03:28 PM   GFRAA >60 03/22/2018 03:30 PM   GFRAA 56 (L) 04/15/2016 03:28 PM    BMP Latest Ref Rng & Units 03/26/2020 03/22/2018 04/22/2017  Glucose 65 - 99 mg/dL 102(H) 95 89  BUN 8 - 27 mg/dL 17 19 17   Creatinine 0.57 - 1.00 mg/dL 0.91 0.90 1.04(H)  BUN/Creat Ratio 12 - 28 19 - 16  Sodium 134 - 144 mmol/L 140 137 135  Potassium 3.5 - 5.2 mmol/L 4.4 4.1 3.8  Chloride 96 - 106 mmol/L 100 103 98  CO2 20 - 29 mmol/L 22 25 26   Calcium 8.7 - 10.3 mg/dL 9.6 9.5 8.9    Current antihypertensive regimen:  Losartan 100 mg daily Spironolactone 25 mg daily Furosemide 40 mg - 1/2 tab daily   How often are you checking your Blood Pressure? daily  Current home BP readings:  Patient states that she checks her BP but doesn't keep a record of her recordings. Patient  states when she does have it checked it ranges 130/70.   What recent interventions/DTPs have been made by any provider to improve Blood Pressure control since last CPP Visit:  None noted.  Any recent hospitalizations or ED visits since last visit with CPP? No  What diet changes have been made to improve Blood Pressure Control?  Patient states she has very little appetite and eats very little.  What exercise is being done to improve your Blood Pressure Control?  Patient states she only walks around house because of her knee pain.  Adherence Review: Is the patient currently on ACE/ARB medication? Yes Does the patient have >5 day gap between last estimated fill dates? No Losartan - last fill 04/11/20 30D Spironolactone -  4/14//22 90D   Star Rating Drugs: Losartan - last fill 04/11/20 30D  Orinda Kenner, RMA Clinical Pharmacists Assistant [phone]  Time Spent: [phone]

## 2020-04-28 NOTE — Progress Notes (Signed)
error 

## 2020-05-06 ENCOUNTER — Other Ambulatory Visit: Payer: Self-pay | Admitting: Internal Medicine

## 2020-05-29 ENCOUNTER — Other Ambulatory Visit: Payer: Self-pay | Admitting: Internal Medicine

## 2020-06-22 ENCOUNTER — Other Ambulatory Visit: Payer: Self-pay | Admitting: Internal Medicine

## 2020-06-30 ENCOUNTER — Telehealth (INDEPENDENT_AMBULATORY_CARE_PROVIDER_SITE_OTHER): Payer: Medicare Other | Admitting: Internal Medicine

## 2020-06-30 ENCOUNTER — Encounter: Payer: Self-pay | Admitting: Internal Medicine

## 2020-06-30 DIAGNOSIS — J349 Unspecified disorder of nose and nasal sinuses: Secondary | ICD-10-CM | POA: Diagnosis not present

## 2020-06-30 DIAGNOSIS — F411 Generalized anxiety disorder: Secondary | ICD-10-CM | POA: Insufficient documentation

## 2020-06-30 MED ORDER — FLUTICASONE PROPIONATE 50 MCG/ACT NA SUSP: 2.0000 | Freq: Every day | NASAL | 6 refills | Status: DC

## 2020-06-30 MED ORDER — DIAZEPAM 5 MG PO TABS
5.0000 mg | ORAL_TABLET | Freq: Two times a day (BID) | ORAL | 0 refills | Status: DC | PRN
Start: 2020-06-30 — End: 2020-07-22

## 2020-06-30 NOTE — Assessment & Plan Note (Signed)
Rx small supply valium and asked to follow up with PCP 1-2 weeks. Advised that likely her breathing problems have made this worse.

## 2020-06-30 NOTE — Progress Notes (Signed)
Virtual Visit via Audio Note  I connected with Kimberly [name] on 06/30/20 at 11:00 AM EDT by an audio-only enabled telemedicine application and verified that I am speaking with the correct person using two identifiers.  The patient and the provider were at separate locations throughout the entire encounter. Patient location: home, Provider location: work   I discussed the limitations of evaluation and management by telemedicine and the availability of in person appointments. The patient expressed understanding and agreed to proceed. The patient and the provider were the only parties present for the visit unless noted in HPI below.  History of Present Illness: The patient is a 75 y.o. female with visit for sinus issues end of April/May. Has had fevers and chills off and on since that time. Keflex did help some. Her nose is very dry and hard to breathe at night. Having more issues with anxiety lately and not sure if she is able to manage this herself. Has been vaccinated against covid-19 and almost due for booster. Denies SOB. Denies fevers lately. Does have nose dryness. Has tried vaporizers and nasal strips. Had an old valium which she took for the anxiety and felt almost normal for 6+ hours.  Observations/Objective: A and O times 3, no cough during visit, no dyspnea during visit  Assessment and Plan: See problem oriented charting  Follow Up Instructions: rx short supply valium and asked to follow up with PCP 1-2 weeks, rx flonase for nose symptoms  Visit time 13 minutes in non-face to face communication with patient and coordination of care.  I discussed the assessment and treatment plan with the patient. The patient was provided an opportunity to ask questions and all were answered. The patient agreed with the plan and demonstrated an understanding of the instructions.   The patient was advised to call back or seek an in-person evaluation if the symptoms worsen or if the condition fails to  improve as anticipated.  Hoyt Koch, MD

## 2020-06-30 NOTE — Assessment & Plan Note (Signed)
Rx flonase concern for some inflammation making night time breathing difficult. F/U PCP 1-2 weeks.

## 2020-07-04 ENCOUNTER — Telehealth: Payer: Self-pay | Admitting: Pharmacist

## 2020-07-04 NOTE — Progress Notes (Signed)
Chronic Care Management Pharmacy Assistant   Name: Kimberly [name]  MRN: [id number] DOB: 23-Jan-1945   Reason for Encounter: Disease State   Conditions to be addressed/monitored: HTN   Recent office visits:  04/14/20 Dr. Quay Burow medication changes cephalexin and hydrocodone 06/30/20 Dr. Sharlet Salina medication changes diazepam 5 mg and fluticasone 50 mcg/act  Recent consult visits:  None ID  Hospital visits:  None in previous 6 months  Medications: Outpatient Encounter Medications as of 07/04/2020  Medication Sig   CALCIUM PO Take 1,000 mg by mouth at bedtime.   cholecalciferol (VITAMIN D) 1000 units tablet Take 1,000 Units by mouth at bedtime.   diazepam (VALIUM) 5 MG tablet Take 1 tablet (5 mg total) by mouth every 12 (twelve) hours as needed for anxiety.   ELIQUIS 5 MG TABS tablet TAKE 1 TABLET BY MOUTH TWICE A DAY APPOINTMENT REQUIRED FOR FURTHER REFILLS [phone]   flecainide (TAMBOCOR) 150 MG tablet Take 1 tablet (150 mg total) by mouth as needed (for AFIB).   fluticasone (FLONASE) 50 MCG/ACT nasal spray Place 2 sprays into both nostrils daily.   furosemide (LASIX) 40 MG tablet TAKE 1/2 TABLET BY MOUTH EVERY DAY   losartan (COZAAR) 100 MG tablet TAKE 1 TABLET BY MOUTH EVERY DAY   Melatonin 10 MG CAPS Take by mouth.   Multiple Vitamin (MULTIVITAMIN WITH MINERALS) TABS tablet Take 1 tablet by mouth at bedtime.   predniSONE (DELTASONE) 5 MG tablet TAKE 1 TABLET BY MOUTH EVERY DAY WITH BREAKFAST   pyridostigmine (MESTINON) 60 MG tablet TAKE 1 TABLET BY MOUTH 3 TIMES A DAY   pyridOXINE (VITAMIN B-6) 100 MG tablet Take 100 mg by mouth at bedtime.   spironolactone (ALDACTONE) 25 MG tablet TAKE 1 TABLET BY MOUTH EVERY DAY   vitamin B-12 (CYANOCOBALAMIN) 1000 MCG tablet Take 1,000 mcg by mouth at bedtime.   No facility-administered encounter medications on file as of 07/04/2020.    Pharmacist Review Reviewed chart prior to disease state call. Spoke with patient regarding  BP  Recent Office Vitals: BP Readings from Last 3 Encounters:  03/26/20 130/72  09/19/19 138/74  02/06/19 127/74   Pulse Readings from Last 3 Encounters:  03/26/20 79  09/19/19 72  02/06/19 76    Wt Readings from Last 3 Encounters:  03/26/20 298 lb (135.2 kg)  09/19/19 294 lb (133.4 kg)  02/06/19 289 lb (131.1 kg)     Kidney Function Lab Results  Component Value Date/Time   CREATININE 0.91 03/26/2020 03:10 PM   CREATININE 0.90 03/22/2018 03:30 PM   CREATININE 1.16 (H) 04/15/2016 03:28 PM   CREATININE 1.17 (H) 04/11/2015 04:10 PM   GFR 58.27 (L) 08/20/2016 11:14 AM   GFRNONAA >60 03/22/2018 03:30 PM   GFRNONAA 48 (L) 04/15/2016 03:28 PM   GFRAA >60 03/22/2018 03:30 PM   GFRAA 56 (L) 04/15/2016 03:28 PM    BMP Latest Ref Rng & Units 03/26/2020 03/22/2018 04/22/2017  Glucose 65 - 99 mg/dL 102(H) 95 89  BUN 8 - 27 mg/dL 17 19 17   Creatinine 0.57 - 1.00 mg/dL 0.91 0.90 1.04(H)  BUN/Creat Ratio 12 - 28 19 - 16  Sodium 134 - 144 mmol/L 140 137 135  Potassium 3.5 - 5.2 mmol/L 4.4 4.1 3.8  Chloride 96 - 106 mmol/L 100 103 98  CO2 20 - 29 mmol/L 22 25 26   Calcium 8.7 - 10.3 mg/dL 9.6 9.5 8.9    Current antihypertensive regimen:  Losartan 100 mg daily Spironolactone 25 mg daily Furosemide 40  mg 1/2 tab daily  How often are you checking your Blood Pressure? 1-2x per week  Current home BP readings: Patient states blood pressure stays around 134/70  What recent interventions/DTPs have been made by any provider to improve Blood Pressure control since last CPP Visit: None ID  Any recent hospitalizations or ED visits since last visit with CPP? No  What diet changes have been made to improve Blood Pressure Control?  Patient states that she has not made any changes in diet  What exercise is being done to improve your Blood Pressure Control?  Patient states she she walks around house to sty active  Adherence Review: Is the patient currently on ACE/ARB medication? Yes Does  the patient have >5 day gap between last estimated fill dates? No     Star Rating Drugs: Losartan 06/23/20 90 ds  [place] Pharmacist Assistant [phone]   Time spent:25

## 2020-07-10 ENCOUNTER — Other Ambulatory Visit: Payer: Self-pay | Admitting: Internal Medicine

## 2020-07-10 DIAGNOSIS — I10 Essential (primary) hypertension: Secondary | ICD-10-CM

## 2020-07-17 ENCOUNTER — Other Ambulatory Visit: Payer: Self-pay | Admitting: Physician Assistant

## 2020-07-17 ENCOUNTER — Other Ambulatory Visit: Payer: Self-pay | Admitting: Internal Medicine

## 2020-07-22 ENCOUNTER — Other Ambulatory Visit: Payer: Self-pay | Admitting: Internal Medicine

## 2020-07-22 DIAGNOSIS — I10 Essential (primary) hypertension: Secondary | ICD-10-CM

## 2020-08-04 ENCOUNTER — Other Ambulatory Visit: Payer: Self-pay | Admitting: *Deleted

## 2020-08-04 MED ORDER — PREDNISONE 5 MG PO TABS: ORAL_TABLET | ORAL | 0 refills | Status: DC

## 2020-08-04 MED ORDER — PREDNISONE 5 MG PO TABS: 5.0000 mg | ORAL_TABLET | Freq: Every day | ORAL | 0 refills | Status: DC

## 2020-08-04 NOTE — Telephone Encounter (Signed)
Next Visit: 08/07/2020  Last Visit: 07/11/2019  Last Fill: 11/21/2019  Dx: Leukocytoclastic vasculitis  Current Dose per office note on 07/11/2019: Prednsione 5 mg by mouth daily  Okay to refill prednisone?

## 2020-08-04 NOTE — Addendum Note (Signed)
Addended by: Carole Binning on: 08/04/2020 04:15 PM   Modules accepted: Orders

## 2020-08-07 ENCOUNTER — Other Ambulatory Visit: Payer: Self-pay

## 2020-08-07 ENCOUNTER — Ambulatory Visit (INDEPENDENT_AMBULATORY_CARE_PROVIDER_SITE_OTHER): Payer: Medicare Other | Admitting: Physician Assistant

## 2020-08-07 ENCOUNTER — Encounter: Payer: Self-pay | Admitting: Physician Assistant

## 2020-08-07 VITALS — BP 165/69 | HR 85 | Resp 18 | Ht 62.0 in

## 2020-08-07 DIAGNOSIS — Z8719 Personal history of other diseases of the digestive system: Secondary | ICD-10-CM

## 2020-08-07 DIAGNOSIS — L409 Psoriasis, unspecified: Secondary | ICD-10-CM | POA: Diagnosis not present

## 2020-08-07 DIAGNOSIS — Z8679 Personal history of other diseases of the circulatory system: Secondary | ICD-10-CM

## 2020-08-07 DIAGNOSIS — M7061 Trochanteric bursitis, right hip: Secondary | ICD-10-CM

## 2020-08-07 DIAGNOSIS — G7000 Myasthenia gravis without (acute) exacerbation: Secondary | ICD-10-CM

## 2020-08-07 DIAGNOSIS — Z8639 Personal history of other endocrine, nutritional and metabolic disease: Secondary | ICD-10-CM | POA: Diagnosis not present

## 2020-08-07 DIAGNOSIS — M5030 Other cervical disc degeneration, unspecified cervical region: Secondary | ICD-10-CM | POA: Diagnosis not present

## 2020-08-07 DIAGNOSIS — Z79899 Other long term (current) drug therapy: Secondary | ICD-10-CM | POA: Diagnosis not present

## 2020-08-07 DIAGNOSIS — Z9889 Other specified postprocedural states: Secondary | ICD-10-CM | POA: Diagnosis not present

## 2020-08-07 DIAGNOSIS — M8589 Other specified disorders of bone density and structure, multiple sites: Secondary | ICD-10-CM | POA: Diagnosis not present

## 2020-08-07 DIAGNOSIS — M310 Hypersensitivity angiitis: Secondary | ICD-10-CM

## 2020-08-07 DIAGNOSIS — M170 Bilateral primary osteoarthritis of knee: Secondary | ICD-10-CM | POA: Diagnosis not present

## 2020-08-07 MED ORDER — BETAMETHASONE DIPROPIONATE 0.05 % EX CREA: TOPICAL_CREAM | Freq: Two times a day (BID) | CUTANEOUS | 0 refills | Status: DC

## 2020-08-07 NOTE — Progress Notes (Signed)
Office Visit Note  Patient: Kimberly [name]             Date of Birth: [birth year]           MRN: [id number]             PCP: [name], MD Referring: [name], MD Visit Date: 08/07/2020 Occupation: @GUAROCC @  Subjective:  Medication monitoring   History of Present Illness: Kimberly [name] is a 75 y.o. female with history of leukocytoclastic vasculitis, osteoarthritis, and DDD.  The patient was last evaluated in the office on 08/26/2017.  Patient remains on prednisone 5 mg 1 tablet by mouth daily.  She has not missed any doses of prednisone recently. According to the patient she has not had a flare of vasculitis in over 2 years.   She states that a rash on her right lower extremity which started several days ago.  She would like a refill of betamethasone cream which has worked in the past to resolve the rash.  She is unsure if the rash is due to vasculitis or psoriasis.  She has not seen her dermatologist Dr. Ronnald Ramp recently.   She has edema in bilateral lower extremities.  She has been having difficulty ambulating due to the severity of pain in both knees.  She also has weakness in bilateral lower extremities secondary to myasthenia gravis.  She continues to follow-up with her neurologist on a regular basis.    Activities of Daily Living:  Patient reports morning stiffness for 0  none .   Patient Denies nocturnal pain.  Difficulty dressing/grooming: Reports Difficulty climbing stairs: Reports Difficulty getting out of chair: Reports Difficulty using hands for taps, buttons, cutlery, and/or writing: Reports  Review of Systems  Constitutional:  Positive for fatigue.  HENT:  Negative for mouth sores, mouth dryness and nose dryness.   Eyes:  Negative for pain, visual disturbance and dryness.  Respiratory:  Negative for cough, hemoptysis, shortness of breath and difficulty breathing.   Cardiovascular:  Positive for swelling in legs/feet. Negative for chest pain, palpitations  and hypertension.  Gastrointestinal:  Positive for diarrhea. Negative for blood in stool and constipation.  Endocrine: Positive for cold intolerance. Negative for increased urination.  Genitourinary:  Negative for difficulty urinating and painful urination.  Musculoskeletal:  Positive for joint pain, gait problem, joint pain and muscle weakness. Negative for joint swelling, myalgias, morning stiffness, muscle tenderness and myalgias.  Skin:  Negative for color change, pallor, rash, hair loss, nodules/bumps, skin tightness, ulcers and sensitivity to sunlight.  Allergic/Immunologic: Positive for susceptible to infections.  Neurological:  Positive for weakness. Negative for dizziness, numbness and headaches.  Hematological:  Positive for bruising/bleeding tendency. Negative for swollen glands.  Psychiatric/Behavioral:  Positive for sleep disturbance. Negative for depressed mood. The patient is not nervous/anxious.    PMFS History:  Patient Active Problem List   Diagnosis Date Noted   GAD (generalized anxiety disorder) 06/30/2020   Nose disorder 04/14/2020   Diarrhea 03/10/2018   Diverticular stricture ([place]) 03/18/2017   Sleep difficulties 10/31/2016   Bilateral leg edema 08/20/2016   Recurrent UTI 08/15/2016   Psoriasis 04/13/2016   History of diverticulitis 04/13/2016   Osteopenia of multiple sites 04/13/2016   DJD (degenerative joint disease), cervical 04/07/2016   Primary osteoarthritis of both knees 04/07/2016   Paroxysmal atrial fibrillation ([place]) 05/12/2015   NSVT (nonsustained ventricular tachycardia) ([place]) 05/12/2015   Abnormal nuclear stress test - INTERMEDIATE RISK 04/14/2015   Coronary artery calcification seen on  CAT scan 02/28/2015   Hyperglycemia 08/09/2014   Dyspnea 01/03/4974   Umbilical hernia 30/05/1100   Pleural effusion 05/10/2012   Myasthenia gravis without exacerbation ([place]) 02/12/2010   Vitamin D deficiency 06/25/2008   Hyperlipidemia 06/25/2008   Essential  hypertension 06/25/2008   Leukocytoclastic vasculitis ([place]) 06/19/2007   ANA POSITIVE, HX OF 01/24/2007    Past Medical History:  Diagnosis Date   A-fib [facility])    Allergy    Aortic atherosclerosis ([facility])    Atrial tachycardia ([place]) 05/12/2015   Cataract    Colovesical fistula    Diverticulitis    Diverticulosis    DJD (degenerative joint disease), cervical    Frequent UTI    GERD (gastroesophageal reflux disease)    History of hiatal hernia    Small hernia   Hypertension    Internal hemorrhoids 01/21/2017   noted on colonopscopy   Leukocytoclastic vasculitis ([facility])    Myalgia and myositis, unspecified    Myasthenia gravis without exacerbation ([facility])    [facility], Dr Anna Genre   NSVT (nonsustained ventricular tachycardia) ([place]) 05/12/2015   Osteoarthritis    Both knees   Osteopenia    Other and unspecified hyperlipidemia    Periumbilical hernia    Pneumonia 04/2016   PONV (postoperative nausea and vomiting)    Psoriasis    plantar ;Dr Jarome Matin   Sleep difficulties    Vasculitis ([place])    Dr Estanislado Pandy   Vitamin D deficiency     Family History  Problem Relation Age of Onset   Heart attack Mother 86   Heart failure Mother    Stroke Paternal Grandmother        in 24s   Lung cancer Father    Cancer Father        lung   Hyperthyroidism Sister        Graves   Hypertension Sister    Diabetes Neg Hx    Colon cancer Neg Hx    Esophageal cancer Neg Hx    Stomach cancer Neg Hx    Rectal cancer Neg Hx    Past Surgical History:  Procedure Laterality Date   APPENDECTOMY     & exploratory for inflammation; Dr Hassell Done   AV FISTULA REPAIR     CARDIAC CATHETERIZATION N/A 04/17/2015   Procedure: Left Heart Cath and Coronary Angiography;  Surgeon: Leonie Man, MD;  Location: [facility];  Service: Cardiovascular;  Laterality: N/A;   CERVICAL FUSION     C5-6; Dr Vertell Limber   CHOLECYSTECTOMY     for stones   COLONOSCOPY W/ POLYPECTOMY  01/21/2017   CYSTOSCOPY WITH STENT  PLACEMENT Bilateral 03/18/2017   Procedure: CYSTOSCOPY WITH RETROGRADE AND BILATERAL URETERAL STENT PLACEMENT;  Surgeon: Lucas Mallow, MD;  Location: [facility];  Service: Urology;  Laterality: Bilateral;   HERNIA REPAIR     IR RADIOLOGIST EVAL & MGMT  11/16/2016   IR RADIOLOGIST EVAL & MGMT  12/08/2016   IR RADIOLOGIST EVAL & MGMT  12/16/2016   LAMINECTOMY  2010   T1-2; Dr Vertell Limber   PARTIAL COLECTOMY N/A 03/18/2017   Procedure: OPEN SIGMOIDECTOMY;  Surgeon: Leighton Ruff, MD;  Location: [facility];  Service: General;  Laterality: N/A;   SHOULDER SURGERY     Left & Right   THORACENTESIS     for peripneumonic effusion   TOTAL ABDOMINAL HYSTERECTOMY W/ BILATERAL SALPINGOOPHORECTOMY     For Fibroids & endometriosis   TOTAL SHOULDER ARTHROPLASTY     UMBILICAL HERNIA REPAIR  N/A 03/18/2017   Procedure: OPEN HERNIA REPAIR UMBILICAL ADULT;  Surgeon: Leighton Ruff, MD;  Location: [facility];  Service: General;  Laterality: N/A;  ERAs pathway   Social History   Social History Narrative   Lives with husband in a one story home.  Had one child that only lived for 14 hours.     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          Epworth Sleepiness Scale = 2 (as of 03/14/2015)   Immunization History  Administered Date(s) Administered   Influenza, High Dose Seasonal PF 09/11/2012, 11/01/2016, 11/24/2017   Influenza,inj,Quad PF,6+ Mos 10/12/2014   Influenza-Unspecified 10/11/2013, 10/24/2015   PFIZER(Purple Top)SARS-COV-2 Vaccination 07/31/2020   Pneumococcal Conjugate-13 05/09/2015   Pneumococcal Polysaccharide-23 04/19/2013     Objective: Vital Signs: BP (!) 165/69 (BP Location: Right Arm, Patient Position: Sitting, Cuff Size: Normal)   Pulse 85   Resp 18   Ht $R'5\' 2"'js$  (1.575 m)   BMI 54.50 kg/m    Physical Exam Vitals and nursing note reviewed.  Constitutional:      Appearance: She is well-developed.  HENT:     Head: Normocephalic and atraumatic.  Eyes:      Conjunctiva/sclera: Conjunctivae normal.  Pulmonary:     Effort: Pulmonary effort is normal.  Abdominal:     Palpations: Abdomen is soft.  Musculoskeletal:     Cervical back: Normal range of motion.  Skin:    General: Skin is warm and dry.     Capillary Refill: Capillary refill takes less than 2 seconds.  Neurological:     Mental Status: She is alert and oriented to person, place, and time.  Psychiatric:        Behavior: Behavior normal.     Musculoskeletal Exam: Patient remained in wheelchair during duration of the visit.  C-spine has good ROM.  Shoulder joints, elbow joints, and wrist joints have good ROM with no discomfort.  Tenderness over the left 1st Mcp joint.  No synovitis of MCP joints noted.  Contracture of right 4th PIP joint.  Hip joints difficult to assess in seated position.  Painful ROM of both knees. Warmth of the right knee noted. Pedal edema bilaterally.   CDAI Exam: CDAI Score: -- Patient Global: --; Provider Global: -- Swollen: --; Tender: -- Joint Exam 08/07/2020   No joint exam has been documented for this visit   There is currently no information documented on the homunculus. Go to the Rheumatology activity and complete the homunculus joint exam.  Investigation: No additional findings.  Imaging: No results found.  Recent Labs: Lab Results  Component Value Date   WBC 12.3 (H) 03/26/2020   HGB 13.4 03/26/2020   PLT 330 03/26/2020   NA 140 03/26/2020   K 4.4 03/26/2020   CL 100 03/26/2020   CO2 22 03/26/2020   GLUCOSE 102 (H) 03/26/2020   BUN 17 03/26/2020   CREATININE 0.91 03/26/2020   BILITOT 0.5 03/26/2020   ALKPHOS 66 03/26/2020   AST 28 03/26/2020   ALT 21 03/26/2020   PROT 6.9 03/26/2020   ALBUMIN 4.2 03/26/2020   CALCIUM 9.6 03/26/2020   GFRAA >60 03/22/2018    Speciality Comments: No specialty comments available.  Procedures:  No procedures performed Allergies: Azithromycin, Ibuprofen, Levaquin [levofloxacin in d5w],  Tetracycline, Isovue [iopamidol], Sulfonamide derivatives, Aleve [naproxen], Tape, Zosyn [piperacillin sod-tazobactam so], Amlodipine besy-benazepril hcl, and Amoxicillin   Assessment / Plan:     Visit Diagnoses: Leukocytoclastic  vasculitis ([place]): She has not had any signs or symptoms of a vasculitis flare in over 2 years.  She has remained on prednisone 5 mg 1 tablet by mouth daily and has not missed any doses recently.  ESR was WNL-21 on 03/26/20.  Pedal edema noted bilateral lower extremities.  Some scaling and weeping of the skin was noted on examination today.  She requested a refill of betamethasone dipropionate 0.05% cream which she applies topically as needed when she develops rashes.  She has not seen Dr. Ronnald Ramp her dermatologist recently.  She was advised to notify us if she develops signs or symptoms of more frequent vasculitis flares.  Patient was offered a 25-month follow-up but requested to be seen on a yearly basis.  High risk medication use: Prednisone 5 mg 1 tablet by mouth daily.  She is aware of the long term risks of prednisone use.  CBC and CMP updated on 03/26/20-results were reviewed today.   Primary osteoarthritis of both knees: Chronic pain. She has significant difficulty with ambulation due to the severity of pain in both knee joints as well as the generalized weakness secondary to myasthenia gravis.  She is not a good candidate for total knee arthoplasty's due to her history of MG. Discussed the importance of fall prevention. She was working with a home therapist for awhile which she found beneficial but her insurance will not approve anymore sessions at this time. Discussed that a referral to pain management is a reasonable option to explore to try to improve her quality of life but she declined at this time.  She previously tried cortisone injections and visco gel injections but had only temporary relief. Declined trying another series of visco gel injections at this time.   Discussed that if she changes her mind and would like to try visco I would recommend scheduling ultrasound guided injections in the future.  She plans on continuing to use a wheelchair or walker to assist her.   H/O repair of rotator cuff: Chronic pain in both shoulders.    Trochanteric bursitis, right hip: She has intermittent discomfort due to trochanteric bursitis of the right hip.   DDD (degenerative disc disease), cervical: She has good ROM with no discomfort at this time.   Psoriasis: She has not seen Dr. Ronnald Ramp recently.  A refill of betamethasone dipropionate 0.05% cream will be sent to the pharmacy as requested.   Osteopenia of multiple sites: She takes calcium 1000 mg daily and vitamin D 1,000 units daily.  History of vitamin D deficiency: She takes vitamin D 1,000 units daily.   Myasthenia gravis without exacerbation ([place]): Followed by neurology. She has generalized weakness affecting her mobility and quality of life.  She has been using a wheelchair and occasionally a walker for assistance.  She is taking pyridostigmine as prescribed.   Other medical conditions are listed as follows:   History of hyperlipidemia  History of diverticulitis  History of hypertension: BP was elevated today in the office-165/69.   Orders: No orders of the defined types were placed in this encounter.  Meds ordered this encounter  Medications   betamethasone dipropionate 0.05 % cream    Sig: Apply topically 2 (two) times daily.    Dispense:  45 g    Refill:  0       Follow-Up Instructions: Return in about 1 year (around 08/07/2021) for Leukocytoclastic vasculitis , Osteoarthritis.   Ofilia Neas, PA-C  Note - This record has been created using Dragon software.  Chart creation errors have been sought, but may not always  have been located. Such creation errors do not reflect on  the standard of medical care.

## 2020-08-09 ENCOUNTER — Other Ambulatory Visit: Payer: Self-pay | Admitting: Internal Medicine

## 2020-08-17 ENCOUNTER — Other Ambulatory Visit: Payer: Self-pay | Admitting: Cardiovascular Disease

## 2020-08-20 ENCOUNTER — Other Ambulatory Visit: Payer: Self-pay | Admitting: Internal Medicine

## 2020-08-20 ENCOUNTER — Other Ambulatory Visit: Payer: Self-pay | Admitting: Physician Assistant

## 2020-08-21 NOTE — Telephone Encounter (Signed)
Please clarify if the patient needs a refill? The prescription was sent at her recent visit on 08/07/20.

## 2020-08-21 NOTE — Telephone Encounter (Signed)
I LMOM for patient to call for a follow up appointment around 07/2021 for Leukocytoclastic Vasculitis and OA with Dr. Estanislado Pandy.

## 2020-08-21 NOTE — Telephone Encounter (Signed)
Next Visit: due July 2023. Message sent to the front to schedule patient   Last Visit: 08/07/2020  Last Fill: 08/07/2020  Dx: Psoriasis  Current Dose per office note on 08/07/2020: betamethasone dipropionate 0.05% cream   Okay to refill betamethasone dipropionate cream?

## 2020-08-21 NOTE — Telephone Encounter (Signed)
Attempted to contact the patient and left message for patient to call the office.  

## 2020-08-21 NOTE — Telephone Encounter (Signed)
Please schedule patient for a follow up visit. Patient due July 2023. Thanks!

## 2020-08-22 NOTE — Telephone Encounter (Signed)
Spoke with patient and she states she does not need a refill at this time. Patient states she will wait to schedule her follow up visit until closer to time.

## 2020-09-05 ENCOUNTER — Telehealth: Payer: Self-pay | Admitting: Internal Medicine

## 2020-09-05 NOTE — Telephone Encounter (Signed)
Waiting for Dr. Quay Burow to sign

## 2020-09-05 NOTE — Telephone Encounter (Signed)
Handicap placard placed in providers box. Requesting a call for pickup  Best number: [phone]

## 2020-09-08 NOTE — Telephone Encounter (Signed)
Handicap left up front.

## 2020-09-08 NOTE — Telephone Encounter (Signed)
Spoke with patient today. 

## 2020-09-09 ENCOUNTER — Other Ambulatory Visit: Payer: Self-pay | Admitting: Physician Assistant

## 2020-09-09 ENCOUNTER — Other Ambulatory Visit: Payer: Self-pay | Admitting: Internal Medicine

## 2020-09-09 ENCOUNTER — Other Ambulatory Visit: Payer: Self-pay | Admitting: Neurology

## 2020-09-12 ENCOUNTER — Telehealth: Payer: Self-pay | Admitting: Neurology

## 2020-09-12 MED ORDER — PYRIDOSTIGMINE BROMIDE 60 MG PO TABS: 60.0000 mg | ORAL_TABLET | Freq: Three times a day (TID) | ORAL | 0 refills | Status: DC

## 2020-09-12 NOTE — Telephone Encounter (Signed)
OK to do virtual visit.  Mestinon will be sent to get her to her next appt.

## 2020-09-12 NOTE — Telephone Encounter (Signed)
Patient called in and scheduled an appointment for 12/26/20. She would like a refill of her pyridostigmine to get her to that appointment. She was advised by her cardiologist not to go into doctor's offices due to her immune system, so that was why she had not been here sooner. She also would like to know if she could do a virtual visit for the 12/26/20 appointment?

## 2020-09-16 ENCOUNTER — Ambulatory Visit (HOSPITAL_COMMUNITY)
Admission: RE | Admit: 2020-09-16 | Discharge: 2020-09-16 | Disposition: A | Discharge status: Home / Self Care | Payer: Medicare Other | Attending: Ophthalmology | Admitting: Ophthalmology

## 2020-09-16 ENCOUNTER — Encounter (HOSPITAL_COMMUNITY)
Admission: RE | Disposition: A | Discharge status: Home / Self Care | Payer: Self-pay | Source: Home / Self Care | Attending: Ophthalmology

## 2020-09-16 ENCOUNTER — Ambulatory Visit (HOSPITAL_COMMUNITY): Payer: Medicare Other | Admitting: Anesthesiology

## 2020-09-16 ENCOUNTER — Other Ambulatory Visit: Payer: Self-pay

## 2020-09-16 DIAGNOSIS — H43812 Vitreous degeneration, left eye: Secondary | ICD-10-CM | POA: Diagnosis not present

## 2020-09-16 DIAGNOSIS — H3321 Serous retinal detachment, right eye: Secondary | ICD-10-CM | POA: Diagnosis not present

## 2020-09-16 DIAGNOSIS — E7849 Other hyperlipidemia: Secondary | ICD-10-CM | POA: Diagnosis not present

## 2020-09-16 DIAGNOSIS — H33011 Retinal detachment with single break, right eye: Secondary | ICD-10-CM | POA: Diagnosis not present

## 2020-09-16 DIAGNOSIS — F411 Generalized anxiety disorder: Secondary | ICD-10-CM | POA: Diagnosis not present

## 2020-09-16 DIAGNOSIS — H5319 Other subjective visual disturbances: Secondary | ICD-10-CM | POA: Diagnosis not present

## 2020-09-16 DIAGNOSIS — H53131 Sudden visual loss, right eye: Secondary | ICD-10-CM | POA: Diagnosis not present

## 2020-09-16 DIAGNOSIS — I4891 Unspecified atrial fibrillation: Secondary | ICD-10-CM | POA: Diagnosis not present

## 2020-09-16 DIAGNOSIS — Z8719 Personal history of other diseases of the digestive system: Secondary | ICD-10-CM | POA: Diagnosis not present

## 2020-09-16 DIAGNOSIS — I10 Essential (primary) hypertension: Secondary | ICD-10-CM | POA: Insufficient documentation

## 2020-09-16 DIAGNOSIS — I480 Paroxysmal atrial fibrillation: Secondary | ICD-10-CM | POA: Diagnosis not present

## 2020-09-16 DIAGNOSIS — H53451 Other localized visual field defect, right eye: Secondary | ICD-10-CM | POA: Diagnosis not present

## 2020-09-16 DIAGNOSIS — Z8249 Family history of ischemic heart disease and other diseases of the circulatory system: Secondary | ICD-10-CM | POA: Diagnosis not present

## 2020-09-16 DIAGNOSIS — Z961 Presence of intraocular lens: Secondary | ICD-10-CM | POA: Diagnosis not present

## 2020-09-16 DIAGNOSIS — H43391 Other vitreous opacities, right eye: Secondary | ICD-10-CM | POA: Diagnosis not present

## 2020-09-16 HISTORY — PX: INJECTION OF SILICONE OIL: SHX6422

## 2020-09-16 HISTORY — PX: REPAIR OF COMPLEX TRACTION RETINAL DETACHMENT: SHX6217

## 2020-09-16 HISTORY — PX: PARS PLANA VITRECTOMY: SHX2166

## 2020-09-16 LAB — BASIC METABOLIC PANEL
Anion gap: 9 (ref 5–15)
BUN: 18 mg/dL (ref 8–23)
CO2: 25 mmol/L (ref 22–32)
Calcium: 9.2 mg/dL (ref 8.9–10.3)
Chloride: 103 mmol/L (ref 98–111)
Creatinine, Ser: 0.9 mg/dL (ref 0.44–1.00)
GFR, Estimated: >60 mL/min (ref >60)
Glucose, Bld: 103 mg/dL — ABNORMAL HIGH (ref 70–99)
Potassium: 3.8 mmol/L (ref 3.5–5.1)
Sodium: 137 mmol/L (ref 135–145)

## 2020-09-16 SURGERY — REPAIR, RETINAL DETACHMENT, COMPLEX
Anesthesia: Monitor Anesthesia Care | Site: Eye | Laterality: Right

## 2020-09-16 MED ORDER — MIDAZOLAM HCL 2 MG/2ML IJ SOLN
INTRAMUSCULAR | Status: AC
  Filled 2020-09-16: qty 2

## 2020-09-16 MED ORDER — PROMETHAZINE HCL 25 MG/ML IJ SOLN: 6.2500 mg | INTRAMUSCULAR | Status: DC | PRN

## 2020-09-16 MED ORDER — FENTANYL CITRATE (PF) 100 MCG/2ML IJ SOLN: 25.0000 ug | INTRAMUSCULAR | Status: DC | PRN

## 2020-09-16 MED ORDER — SODIUM HYALURONATE 10 MG/ML IO SOLUTION
PREFILLED_SYRINGE | INTRAOCULAR | Status: AC
  Filled 2020-09-16: qty 0.85

## 2020-09-16 MED ORDER — ATROPINE SULFATE 1 % OP SOLN
OPHTHALMIC | Status: AC
  Filled 2020-09-16: qty 5

## 2020-09-16 MED ORDER — BACITRACIN-POLYMYXIN B 500-10000 UNIT/GM OP OINT
TOPICAL_OINTMENT | OPHTHALMIC | Status: AC
  Filled 2020-09-16: qty 3.5

## 2020-09-16 MED ORDER — OFLOXACIN 0.3 % OP SOLN
1.0000 [drp] | OPHTHALMIC | Status: AC | PRN
  Administered 2020-09-16 (×3): 1 [drp] via OPHTHALMIC
  Filled 2020-09-16: qty 5

## 2020-09-16 MED ORDER — LIDOCAINE HCL 2 % IJ SOLN
INTRAMUSCULAR | Status: AC
  Filled 2020-09-16: qty 20

## 2020-09-16 MED ORDER — PYRIDOSTIGMINE BROMIDE 60 MG PO TABS
60.0000 mg | ORAL_TABLET | Freq: Once | ORAL | Status: AC
  Administered 2020-09-16: 60 mg via ORAL
  Filled 2020-09-16 (×2): qty 1

## 2020-09-16 MED ORDER — PROPARACAINE HCL 0.5 % OP SOLN
1.0000 [drp] | OPHTHALMIC | Status: AC | PRN
  Administered 2020-09-16 (×2): 1 [drp] via OPHTHALMIC
  Filled 2020-09-16: qty 15

## 2020-09-16 MED ORDER — LIDOCAINE HCL (CARDIAC) PF 100 MG/5ML IV SOSY
PREFILLED_SYRINGE | INTRAVENOUS | Status: DC | PRN
  Administered 2020-09-16: 20 mg via INTRATRACHEAL

## 2020-09-16 MED ORDER — LIDOCAINE HCL 2 % IJ SOLN
INTRAMUSCULAR | Status: DC | PRN
  Administered 2020-09-16: 6 mL via RETROBULBAR

## 2020-09-16 MED ORDER — BSS IO SOLN
INTRAOCULAR | Status: DC | PRN
  Administered 2020-09-16: 15 mL via INTRAOCULAR

## 2020-09-16 MED ORDER — VANCOMYCIN SUBCONJUNCTIVAL INJECTION 25 MG/0.5 ML
INTRAOCULAR | Status: DC | PRN
  Administered 2020-09-16: 25 mg via SUBCONJUNCTIVAL

## 2020-09-16 MED ORDER — PYRIDOSTIGMINE BROMIDE 60 MG PO TABS: 60.0000 mg | ORAL_TABLET | Freq: Once | ORAL | Status: DC

## 2020-09-16 MED ORDER — 0.9 % SODIUM CHLORIDE (POUR BTL) OPTIME
TOPICAL | Status: DC | PRN
  Administered 2020-09-16: 200 mL

## 2020-09-16 MED ORDER — PHENYLEPHRINE HCL 2.5 % OP SOLN
1.0000 [drp] | OPHTHALMIC | Status: AC | PRN
  Administered 2020-09-16 (×3): 1 [drp] via OPHTHALMIC
  Filled 2020-09-16: qty 2

## 2020-09-16 MED ORDER — EPINEPHRINE PF 1 MG/ML IJ SOLN
INTRAOCULAR | Status: DC | PRN
  Administered 2020-09-16: 1 mL

## 2020-09-16 MED ORDER — NA CHONDROIT SULF-NA HYALURON 40-30 MG/ML IO SOSY
INTRAOCULAR | Status: AC
  Filled 2020-09-16: qty 0.5

## 2020-09-16 MED ORDER — MEPERIDINE HCL 25 MG/ML IJ SOLN: 6.2500 mg | INTRAMUSCULAR | Status: DC | PRN

## 2020-09-16 MED ORDER — SODIUM HYALURONATE 10 MG/ML IO SOLUTION
PREFILLED_SYRINGE | INTRAOCULAR | Status: DC | PRN
  Administered 2020-09-16: .85 mL via INTRAOCULAR

## 2020-09-16 MED ORDER — ORAL CARE MOUTH RINSE: 15.0000 mL | Freq: Once | OROMUCOSAL | Status: AC

## 2020-09-16 MED ORDER — CYCLOPENTOLATE HCL 1 % OP SOLN
1.0000 [drp] | OPHTHALMIC | Status: AC | PRN
  Administered 2020-09-16 (×3): 1 [drp] via OPHTHALMIC
  Filled 2020-09-16: qty 2

## 2020-09-16 MED ORDER — CHLORHEXIDINE GLUCONATE 0.12 % MT SOLN
15.0000 mL | Freq: Once | OROMUCOSAL | Status: AC
  Administered 2020-09-16: 15 mL via OROMUCOSAL
  Filled 2020-09-16: qty 15

## 2020-09-16 MED ORDER — STERILE WATER FOR IRRIGATION IR SOLN
Status: DC | PRN
  Administered 2020-09-16: 300 mL

## 2020-09-16 MED ORDER — BUPIVACAINE HCL (PF) 0.75 % IJ SOLN
INTRAMUSCULAR | Status: AC
  Filled 2020-09-16: qty 10

## 2020-09-16 MED ORDER — SODIUM CHLORIDE 0.9 % IV SOLN: INTRAVENOUS | Status: DC

## 2020-09-16 MED ORDER — EPINEPHRINE PF 1 MG/ML IJ SOLN
INTRAMUSCULAR | Status: AC
  Filled 2020-09-16: qty 1

## 2020-09-16 MED ORDER — HYALURONIDASE HUMAN NICU 150 UNIT/ML INJECTION
INTRAMUSCULAR | Status: DC | PRN
  Administered 2020-09-16: 150 [IU]

## 2020-09-16 MED ORDER — CEFAZOLIN SUBCONJUNCTIVAL INJECTION 100 MG/0.5 ML
100.0000 mg | INJECTION | SUBCONJUNCTIVAL | Status: DC
  Filled 2020-09-16: qty 5

## 2020-09-16 MED ORDER — LACTATED RINGERS IV SOLN: INTRAVENOUS | Status: DC | PRN

## 2020-09-16 MED ORDER — MIDAZOLAM HCL 5 MG/5ML IJ SOLN
INTRAMUSCULAR | Status: DC | PRN
  Administered 2020-09-16: .5 mg via INTRAVENOUS

## 2020-09-16 MED ORDER — DEXAMETHASONE SODIUM PHOSPHATE 10 MG/ML IJ SOLN
INTRAMUSCULAR | Status: AC
  Filled 2020-09-16: qty 1

## 2020-09-16 MED ORDER — BSS PLUS IO SOLN
INTRAOCULAR | Status: DC | PRN
  Administered 2020-09-16: 1 via INTRAOCULAR

## 2020-09-16 MED ORDER — TOBRAMYCIN-DEXAMETHASONE 0.3-0.1 % OP OINT
TOPICAL_OINTMENT | OPHTHALMIC | Status: AC
  Filled 2020-09-16: qty 3.5

## 2020-09-16 MED ORDER — INDOCYANINE GREEN 25 MG IV SOLR
INTRAVENOUS | Status: AC
  Filled 2020-09-16: qty 10

## 2020-09-16 MED ORDER — HYALURONIDASE HUMAN 150 UNIT/ML IJ SOLN
INTRAMUSCULAR | Status: AC
  Filled 2020-09-16: qty 1

## 2020-09-16 MED ORDER — DEXAMETHASONE SODIUM PHOSPHATE 10 MG/ML IJ SOLN
INTRAMUSCULAR | Status: DC | PRN
  Administered 2020-09-16: 10 mg

## 2020-09-16 MED ORDER — BSS PLUS IO SOLN
INTRAOCULAR | Status: AC
  Filled 2020-09-16: qty 500

## 2020-09-16 MED ORDER — BSS IO SOLN
INTRAOCULAR | Status: AC
  Filled 2020-09-16: qty 15

## 2020-09-16 MED ORDER — VANCOMYCIN SUBCONJUNCTIVAL INJECTION 25 MG/0.5 ML
25.0000 mg | INTRAOCULAR | Status: DC
  Filled 2020-09-16: qty 0.5

## 2020-09-16 MED ORDER — PROPOFOL 10 MG/ML IV BOLUS
INTRAVENOUS | Status: DC | PRN
  Administered 2020-09-16 (×2): 20 mg via INTRAVENOUS

## 2020-09-16 MED ORDER — TROPICAMIDE 1 % OP SOLN
1.0000 [drp] | OPHTHALMIC | Status: AC | PRN
  Administered 2020-09-16 (×3): 1 [drp] via OPHTHALMIC
  Filled 2020-09-16: qty 15

## 2020-09-16 SURGICAL SUPPLY — 61 items
APPLICATOR COTTON TIP 6 STRL (MISCELLANEOUS) ×1 IMPLANT
APPLICATOR COTTON TIP 6IN STRL (MISCELLANEOUS) ×2 IMPLANT
BAND WRIST GAS GREEN (MISCELLANEOUS) IMPLANT
BLADE MVR KNIFE 20G (BLADE) IMPLANT
BLADE STAB KNIFE 15DEG (BLADE) IMPLANT
CABLE BIPOLOR RESECTION CORD (MISCELLANEOUS) ×2 IMPLANT
CANNULA ANT CHAM MAIN (OPHTHALMIC RELATED) IMPLANT
CANNULA DUAL BORE 23G (CANNULA) IMPLANT
CANNULA DUALBORE 25G (CANNULA) IMPLANT
CANNULA VLV SOFT TIP 25GA (OPHTHALMIC) ×2 IMPLANT
CAUTERY EYE LOW TEMP 1300F FIN (OPHTHALMIC RELATED) IMPLANT
CLSR STERI-STRIP ANTIMIC 1/2X4 (GAUZE/BANDAGES/DRESSINGS) ×2 IMPLANT
COVER MAYO STAND STRL (DRAPES) IMPLANT
DRAPE HALF SHEET 40X57 (DRAPES) ×2 IMPLANT
DRAPE INCISE 51X51 W/FILM STRL (DRAPES) IMPLANT
DRAPE RETRACTOR (MISCELLANEOUS) ×2 IMPLANT
ERASER HMR WETFIELD 23G BP (MISCELLANEOUS) IMPLANT
FORCEPS ECKARDT ILM 25G SERR (OPHTHALMIC RELATED) IMPLANT
FORCEPS GRIESHABER ILM 25G A (INSTRUMENTS) IMPLANT
GAS AUTO FILL CONSTEL (OPHTHALMIC)
GAS AUTO FILL CONSTELLATION (OPHTHALMIC) IMPLANT
GAS WRIST BAND GREEN (MISCELLANEOUS)
GLOVE SURG SYN 7.5  E (GLOVE) ×2
GLOVE SURG SYN 7.5 E (GLOVE) ×1 IMPLANT
GOWN STRL REUS W/ TWL LRG LVL3 (GOWN DISPOSABLE) ×2 IMPLANT
GOWN STRL REUS W/TWL LRG LVL3 (GOWN DISPOSABLE) ×4
KIT BASIN OR (CUSTOM PROCEDURE TRAY) ×2 IMPLANT
KIT TURNOVER KIT B (KITS) ×2 IMPLANT
LENS BIOM SUPER VIEW SET DISP (MISCELLANEOUS) ×2 IMPLANT
MICROPICK 25G (MISCELLANEOUS)
NEEDLE 18GX1X1/2 (RX/OR ONLY) (NEEDLE) ×2 IMPLANT
NEEDLE 25GX 5/8IN NON SAFETY (NEEDLE) IMPLANT
NEEDLE 27GX1/2 REG BEVEL ECLIP (NEEDLE) ×2 IMPLANT
NEEDLE FILTER BLUNT 18X 1/2SAF (NEEDLE) ×2
NEEDLE FILTER BLUNT 18X1 1/2 (NEEDLE) ×2 IMPLANT
NEEDLE HYPO 25GX1X1/2 BEV (NEEDLE) IMPLANT
NEEDLE HYPO 30X.5 LL (NEEDLE) IMPLANT
NEEDLE RETROBULBAR 25GX1.5 (NEEDLE) ×2 IMPLANT
NS IRRIG 1000ML POUR BTL (IV SOLUTION) ×2 IMPLANT
OIL SILICONE OPHTHALMIC 1000 (Ophthalmic Related) ×2 IMPLANT
PACK FRAGMATOME (OPHTHALMIC) IMPLANT
PACK VITRECTOMY CUSTOM (CUSTOM PROCEDURE TRAY) ×2 IMPLANT
PAD ARMBOARD 7.5X6 YLW CONV (MISCELLANEOUS) ×4 IMPLANT
PAK PIK VITRECTOMY CVS 25GA (OPHTHALMIC) ×2 IMPLANT
PENCIL BIPOLAR 25GA STR DISP (OPHTHALMIC RELATED) ×2 IMPLANT
PICK MICROPICK 25G (MISCELLANEOUS) IMPLANT
PROBE ENDO DIATHERMY 25G (MISCELLANEOUS) IMPLANT
PROBE LASER ILLUM FLEX CVD 25G (OPHTHALMIC) ×2 IMPLANT
ROLLS DENTAL (MISCELLANEOUS) IMPLANT
SCRAPER DIAMOND 25GA (OPHTHALMIC RELATED) IMPLANT
SOL ANTI FOG 6CC (MISCELLANEOUS) ×1 IMPLANT
SOLUTION ANTI FOG 6CC (MISCELLANEOUS) ×1
STOPCOCK 4 WAY LG BORE MALE ST (IV SETS) IMPLANT
SUT VICRYL 7 0 TG140 8 (SUTURE) IMPLANT
SUT VICRYL 8 0 TG140 8 (SUTURE) IMPLANT
SYR 10ML LL (SYRINGE) ×2 IMPLANT
SYR 20ML LL LF (SYRINGE) ×2 IMPLANT
SYR 5ML LL (SYRINGE) ×2 IMPLANT
SYR TB 1ML LUER SLIP (SYRINGE) ×4 IMPLANT
WATER STERILE IRR 1000ML POUR (IV SOLUTION) ×2 IMPLANT
WIPE INSTRUMENT VISIWIPE 73X73 (MISCELLANEOUS) IMPLANT

## 2020-09-16 NOTE — Brief Op Note (Signed)
09/16/2020  9:07 PM  PATIENT:  Kimberly [name]  75 y.o. female  PRE-OPERATIVE DIAGNOSIS:  Right eye retinal detachment  POST-OPERATIVE DIAGNOSIS:  Right eye retinal detachment  PROCEDURE:  Procedure(s): REPAIR OF RETINAL DETACHMENT (Right) PARS PLANA VITRECTOMY WITH 25 GAUGE With LASER (Right) INJECTION OF SILICONE OIL (Right)  SURGEON:  Surgeon(s) and Role:    Jalene Mullet, MD - Primary  PHYSICIAN ASSISTANT:   ASSISTANTS: none   ANESTHESIA:   local and MAC  EBL:  minimal   BLOOD ADMINISTERED:none  DRAINS: none   LOCAL MEDICATIONS USED:  MARCAINE    and LIDOCAINE   SPECIMEN:  No Specimen  DISPOSITION OF SPECIMEN:  N/A  COUNTS:  YES  TOURNIQUET:  * No tourniquets in log *  DICTATION: .Note written in EPIC  PLAN OF CARE: Discharge to home after PACU  PATIENT DISPOSITION:  PACU - hemodynamically stable.   Delay start of Pharmacological VTE agent (>24hrs) due to surgical blood loss or risk of bleeding: not applicable

## 2020-09-16 NOTE — Anesthesia Preprocedure Evaluation (Addendum)
Anesthesia Evaluation  Patient identified by MRN, date of birth, ID band Patient awake    Reviewed: Allergy & Precautions, NPO status , Patient's Chart, lab work & pertinent test results  History of Anesthesia Complications (+) PONV and history of anesthetic complications  Airway Mallampati: [name]  TM Distance: >3 FB Neck ROM: Full    Dental  (+) Dental Advisory Given, Teeth Intact   Pulmonary shortness of breath, pneumonia,    Pulmonary exam normal breath sounds clear to auscultation       Cardiovascular hypertension, Pt. on medications + CAD and + Peripheral Vascular Disease  Normal cardiovascular exam+ dysrhythmias Atrial Fibrillation + Valvular Problems/Murmurs  Rhythm:Regular Rate:Normal  Echo 2019 - Left ventricle: The cavity size was normal. Wall thickness was increased in a pattern of mild LVH. Indeterminant diastolic function (atrial fibrillation). Systolic function was normal. The estimated ejection fraction was in the range of 55% to 60%. Although no diagnostic regional wall motion abnormality was identified, this possibility cannot be completely excluded on the basis of this study.  - Aortic valve: There was no stenosis.  - Mitral valve: Mildly calcified annulus. There was no significant regurgitation.  - Right ventricle: The cavity size was normal. Systolic function was normal.  - Pulmonary arteries: No complete TR doppler jet so unable to estimate PA systolic pressure.  - Systemic veins: IVC measured 2.0 cm with < 50% respirophasic variation, suggesting RA pressure 8 mmHg.    Neuro/Psych PSYCHIATRIC DISORDERS Anxiety  Neuromuscular disease    GI/Hepatic Neg liver ROS, hiatal hernia, GERD  ,  Endo/Other  Morbid obesity  Renal/GU negative Renal ROS     Musculoskeletal  (+) Arthritis ,   Abdominal (+) + obese,   Peds  Hematology negative hematology ROS (+)   Anesthesia Other Findings    Reproductive/Obstetrics negative OB ROS                            Anesthesia Physical  Anesthesia Plan  ASA: 4  Anesthesia Plan:    Post-op Pain Management:    Induction: Intravenous  PONV Risk Score and Plan: 4 or greater and Ondansetron, Treatment may vary due to age or medical condition and Dexamethasone  Airway Management Planned:   Additional Equipment: None  Intra-op Plan:   Post-operative Plan:   Informed Consent: I have reviewed the patients History and Physical, chart, labs and discussed the procedure including the risks, benefits and alternatives for the proposed anesthesia with the patient or authorized representative who has indicated his/her understanding and acceptance.     Dental advisory given  Plan Discussed with: CRNA  Anesthesia Plan Comments:       Anesthesia Quick Evaluation

## 2020-09-16 NOTE — Discharge Instructions (Signed)
DO NOT SLEEP ON BACK, THE EYE PRESSURE CAN GO UP AND CAUSE VISION LOSS   SLEEP ON SIDE WITH NOSE TO PILLOW  DURING DAY KEEP UPRIGHT 

## 2020-09-16 NOTE — Transfer of Care (Signed)
Immediate Anesthesia Transfer of Care Note  Patient: Kimberly [name]  Procedure(s) Performed: REPAIR OF COMPLEX TRACTION RETINAL DETACHMENT (Right: Eye) PARS PLANA VITRECTOMY WITH 25 GAUGE With LASER (Right: Eye) INJECTION OF SILICONE OIL (Right: Eye)  Patient Location: PACU  Anesthesia Type:MAC  Level of Consciousness: awake  Airway & Oxygen Therapy: Patient Spontanous Breathing  Post-op Assessment: Report given to RN and Post -op Vital signs reviewed and stable  Post vital signs: Reviewed and stable  Last Vitals:  Vitals Value Taken Time  BP    Temp    Pulse    Resp    SpO2      Last Pain:  Vitals:   09/16/20 1718  TempSrc: Oral  PainSc: 0-No pain         Complications: No notable events documented.

## 2020-09-16 NOTE — H&P (Signed)
Date of examination:  09/16/20  Indication for surgery: retinal detachment right eye  Pertinent past medical history:  Past Medical History:  Diagnosis Date   A-fib [facility])    Allergy    Aortic atherosclerosis ([place])    Atrial tachycardia ([place]) 05/12/2015   Cataract    Colovesical fistula    Diverticulitis    Diverticulosis    DJD (degenerative joint disease), cervical    Frequent UTI    GERD (gastroesophageal reflux disease)    History of hiatal hernia    Small hernia   Hypertension    Internal hemorrhoids 01/21/2017   noted on colonopscopy   Leukocytoclastic vasculitis ([facility])    Myalgia and myositis, unspecified    Myasthenia gravis without exacerbation ([facility])    [facility], Dr Anna Genre   NSVT (nonsustained ventricular tachycardia) ([place]) 05/12/2015   Osteoarthritis    Both knees   Osteopenia    Other and unspecified hyperlipidemia    Periumbilical hernia    Pneumonia 04/2016   PONV (postoperative nausea and vomiting)    Psoriasis    plantar ;Dr Jarome Matin   Sleep difficulties    Vasculitis ([place])    Dr Estanislado Pandy   Vitamin D deficiency     Pertinent ocular history:  floaters  Pertinent family history:  Family History  Problem Relation Age of Onset   Heart attack Mother 64   Heart failure Mother    Stroke Paternal Grandmother        in 39s   Lung cancer Father    Cancer Father        lung   Hyperthyroidism Sister        Graves   Hypertension Sister    Diabetes Neg Hx    Colon cancer Neg Hx    Esophageal cancer Neg Hx    Stomach cancer Neg Hx    Rectal cancer Neg Hx     General:  Healthy appearing patient in no distress.    Eyes:    Acuity OD 20/CF  External: Within normal limits     Anterior segment: Within normal limits         Fundus: temporal retinal detachment right eye   Impression: Retinal detachment right eye  Plan:  Retinal detachment repair right eye   Jalene Mullet, MD

## 2020-09-16 NOTE — Op Note (Signed)
Kimberly [name] 09/16/2020 Diagnosis: Retinal detachment right eye  Procedure: [place]ana Vitrectomy, Endolaser, Drainage of Subretinal Fluid, Fluid Gas Exchange, and Silicone Oil Operative Eye:  right eye  Surgeon: Royston Cowper Estimated Blood Loss: minimal Specimens for Pathology:  None Complications: none   The  patient was prepped and draped in the usual fashion for ocular surgery on the  right eye .  A lid speculum was placed.  Infusion line and trocar was placed at the 8 o'clock position approximately 3.5 mm from the surgical limbus.   The infusion line was allowed to run and then clamped when placed at the cannula opening. The line was inserted and secured to the drape with an adhesive strip.   Active trocars/cannula were placed at the 10 and 2 o'clock positions approximately 3.5 mm from the surgical limbus. The cannula was visualized in the vitreous cavity.  The light pipe and vitreous cutter were inserted into the vitreous cavity and a core vitrectomy was performed.  Care taken to remove the vitreous up to the vitreous base for 360 degrees.  The vitreous was carefully shaved over the detached retina and the temporal break.  Endocautery was used to make a posterior retinotomy, Subretinal fluid was drained and the retina flattened.  A complete air/fluid exchange was performed.  3 rows of endolaser were applied 360 degrees to the periphery and surrounding the retinal break.  [id number] centistoke silicone oil was placed in the eye.  The superior cannulas were sequentially removed with concommitant tamponade using a cotton tipped applicator and noted to be closed.  The infusion line and trocar were removed and the sclerotomy was noted to be air tight with normal intraocular pressure by digital palpapation.  Subconjunctival injection of  Dexamethasone '4mg'$ /53m was placed in the infero-medial quadrant.   The speculum and drapes were removed and the eye was patched with Polymixin/Bacitracin  ophthalmic ointment. An eye shield was placed and the patient was transferred alert and conversant with stable vital signs to the post operative recovery area.  The patient tolerated the procedure well and no complications were noted.  NRoyston CowperMD

## 2020-09-17 ENCOUNTER — Other Ambulatory Visit: Payer: Self-pay | Admitting: Internal Medicine

## 2020-09-17 ENCOUNTER — Other Ambulatory Visit: Payer: Self-pay | Admitting: Neurology

## 2020-09-17 ENCOUNTER — Encounter: Payer: Self-pay | Admitting: Internal Medicine

## 2020-09-17 ENCOUNTER — Encounter (HOSPITAL_COMMUNITY): Payer: Self-pay | Admitting: Ophthalmology

## 2020-09-17 DIAGNOSIS — H3321 Serous retinal detachment, right eye: Secondary | ICD-10-CM | POA: Insufficient documentation

## 2020-09-17 DIAGNOSIS — H33011 Retinal detachment with single break, right eye: Secondary | ICD-10-CM | POA: Diagnosis not present

## 2020-09-17 NOTE — Anesthesia Postprocedure Evaluation (Signed)
Anesthesia Post Note  Patient: Kimberly [name]  Procedure(s) Performed: REPAIR OF COMPLEX TRACTION RETINAL DETACHMENT (Right: Eye) PARS PLANA VITRECTOMY WITH 25 GAUGE With LASER (Right: Eye) INJECTION OF SILICONE OIL (Right: Eye)     Patient location during evaluation: PACU Anesthesia Type: MAC Level of consciousness: awake and alert Pain management: pain level controlled Vital Signs Assessment: post-procedure vital signs reviewed and stable Respiratory status: spontaneous breathing, nonlabored ventilation, respiratory function stable and patient connected to nasal cannula oxygen Cardiovascular status: stable and blood pressure returned to baseline Postop Assessment: no apparent nausea or vomiting Anesthetic complications: no   No notable events documented.  Last Vitals:  Vitals:   09/16/20 2200 09/16/20 2215  BP: (!) 125/110 (!) 131/91  Pulse: 86 81  Resp: 15 19  Temp:  (!) 36.3 C  SpO2: 95% 93%    Last Pain:  Vitals:   09/16/20 2215  TempSrc:   PainSc: 0-No pain                 Tiajuana Amass

## 2020-09-17 NOTE — Addendum Note (Signed)
Addendum  created 09/17/20 0908 by Nolon Nations, MD   SmartForm saved

## 2020-09-19 ENCOUNTER — Encounter (HOSPITAL_BASED_OUTPATIENT_CLINIC_OR_DEPARTMENT_OTHER): Payer: Self-pay | Admitting: Cardiovascular Disease

## 2020-09-19 ENCOUNTER — Other Ambulatory Visit: Payer: Self-pay

## 2020-09-19 ENCOUNTER — Ambulatory Visit (INDEPENDENT_AMBULATORY_CARE_PROVIDER_SITE_OTHER): Payer: Medicare Other | Admitting: Cardiovascular Disease

## 2020-09-19 DIAGNOSIS — E785 Hyperlipidemia, unspecified: Secondary | ICD-10-CM | POA: Diagnosis not present

## 2020-09-19 DIAGNOSIS — I480 Paroxysmal atrial fibrillation: Secondary | ICD-10-CM

## 2020-09-19 DIAGNOSIS — I2510 Atherosclerotic heart disease of native coronary artery without angina pectoris: Secondary | ICD-10-CM

## 2020-09-19 DIAGNOSIS — I10 Essential (primary) hypertension: Secondary | ICD-10-CM | POA: Diagnosis not present

## 2020-09-19 NOTE — Patient Instructions (Signed)
Medication Instructions:  °Continue current medications ° °*If you need a refill on your cardiac medications before your next appointment, please call your pharmacy* ° ° °Lab Work: °None Ordered ° ° °Testing/Procedures: °None Ordered ° ° °Follow-Up: °At [facility], you and your health needs are our priority.  As part of our continuing mission to provide you with exceptional heart care, we have created designated Provider Care Teams.  These Care Teams include your primary Cardiologist (physician) and Advanced Practice Providers (APPs -  Physician Assistants and Nurse Practitioners) who all work together to provide you with the care you need, when you need it. ° °We recommend signing up for the patient portal called "MyChart".  Sign up information is provided on this After Visit Summary.  MyChart is used to connect with patients for Virtual Visits (Telemedicine).  Patients are able to view lab/test results, encounter notes, upcoming appointments, etc.  Non-urgent messages can be sent to your provider as well.   °To learn more about what you can do with MyChart, go to [email or web address].   ° °Your next appointment:   °6 month(s) ° °The format for your next appointment:   °In Person ° °Provider:   °Tiffany Kennedale, MD  ° ° ° ° °

## 2020-09-19 NOTE — Assessment & Plan Note (Signed)
Asymptomatic, however she is not very physically active.  She had a cath and did not have any significant CAD in 2017.  She has not had lipids checked in a while.  She goes to see her PCP soon and will have them checked at that time.  No aspirin given that she is on Eliquis.  No statin given her myasthenia.

## 2020-09-19 NOTE — Assessment & Plan Note (Signed)
Blood pressure is very well-controlled on losartan and spironolactone.

## 2020-09-19 NOTE — Progress Notes (Signed)
Cardiology Office Note   Date:  09/19/2020   ID:  JHARLINE LEGRO, DOB 05-03-1945, MRN [id number]  PCP:  [name], MD  Cardiologist:  Skeet Latch, MD  Electrophysiologist:  None   Evaluation Performed:  Follow-Up Visit  Chief Complaint:  Atrial fibrillation  History of Present Illness:    Kimberly [name] is a 75 y.o. female with paroxysmal atrial fibrillation, hypertension, coronary artery calcification, myasthenia gravis, R LE lymphedema, recurrent R pleural effusion, hyperlipidemia who presents for follow up. She was initially seen 03/2015 for evaluation of asymptomatic coronary calcifications.  She had a chest CT to follow up on pleural effusions and was noted to have coronary calcification. She was asymptomatic but did not exercise much due to myasthenia.  She had a [employer] 03/2015 that was concerning for inferior and apical ischemia.  She subsequently underwent left heart catheterization 04/2015 that showed normal, but tortuous coronary arteries.  She reported palpitations and had a 30 day event monitor that showed paroxysmal atrial tachycardia and NSVT. She was not started on any nodal agents due to her history of myasthenia.  She had an echo 05/2015 that revealed LVEF 60-65% with normal diastolic function.   Ms. Kimberly [name] was admitted 03/2017 for repair of a colovesicular fistula.  On postoperative day 6 she developed atrial fibrillation with rapid ventricular response.  She received 1 dose of IV metoprolol but was switched to amiodarone given her myasthenia.  She converted back to sinus rhythm on amiodarone and was started on Eliquis.  She followed up with Almyra Deforest, PA, 03/2017 and reported dizziness and nausea that was thought to be due to the amiodarone.  Given that her atrial for relation was thought to be postoperative amiodarone was decreased with plans to discontinue it.  She did not have any recurrent atrial fibrillation after that time.  She again wore a 30-day  event monitor that was without atrial fibrillation. She had an episode of afib 03/2018 and was seen in the atrial fibrillation clinic.  She was restarted on Eliquis. She was started on flecainide to be taken as needed.    At her last appointment, she had occasional episodes of atrial fibrillation.  Today, patient is accompanied by her husband. She states she is doing terrible. For the last 3-4 months, she was told she didn't know if she had vasculitis or cellulitis of her R leg.  She had significant R eye swelling and redness.  She also developed shingles on her back. Last weekend, she experiences blurry vision due to a detached retina. She had surgery 09/11/2020. After surgery, she has to sleep in a 30 degree angle in her recliner chair for 10 days, so she isn't getting much rest.  She denies chest pains or pressure. She also reports having two episodes of atrial fibrillation since last visit (3/22). The last episode lasted for 2 hours but had none recently.  She became very ill after her last visit and thinks that she may have had COVID-19.  She has been struggling a lot with anxiety.  She started taking Diazepam 5 mg that has been helpful.  Past Medical History:  Diagnosis Date   A-fib [facility])    Allergy    Aortic atherosclerosis ([place])    Atrial tachycardia ([place]) 05/12/2015   Cataract    Colovesical fistula    Diverticulitis    Diverticulosis    DJD (degenerative joint disease), cervical    Frequent UTI    GERD (gastroesophageal reflux disease)  History of hiatal hernia    Small hernia   Hypertension    Internal hemorrhoids 01/21/2017   noted on colonopscopy   Leukocytoclastic vasculitis (HCC)    Myalgia and myositis, unspecified    Myasthenia gravis without exacerbation ([place])    [facility], Dr Anna Genre   NSVT (nonsustained ventricular tachycardia) ([place]) 05/12/2015   Osteoarthritis    Both knees   Osteopenia    Other and unspecified hyperlipidemia    Periumbilical hernia    Pneumonia  04/2016   PONV (postoperative nausea and vomiting)    Psoriasis    plantar ;Dr Jarome Matin   Sleep difficulties    Vasculitis [facility])    Dr Estanislado Pandy   Vitamin D deficiency    Past Surgical History:  Procedure Laterality Date   APPENDECTOMY     & exploratory for inflammation; Dr Hassell Done   AV FISTULA REPAIR     CARDIAC CATHETERIZATION N/A 04/17/2015   Procedure: Left Heart Cath and Coronary Angiography;  Surgeon: Leonie Man, MD;  Location: [facility];  Service: Cardiovascular;  Laterality: N/A;   CERVICAL FUSION     C5-6; Dr Vertell Limber   CHOLECYSTECTOMY     for stones   COLONOSCOPY W/ POLYPECTOMY  01/21/2017   CYSTOSCOPY WITH STENT PLACEMENT Bilateral 03/18/2017   Procedure: CYSTOSCOPY WITH RETROGRADE AND BILATERAL URETERAL STENT PLACEMENT;  Surgeon: Lucas Mallow, MD;  Location: [facility];  Service: Urology;  Laterality: Bilateral;   HERNIA REPAIR     INJECTION OF SILICONE OIL Right 0000000   Procedure: INJECTION OF SILICONE OIL;  Surgeon: Jalene Mullet, MD;  Location: [place];  Service: Ophthalmology;  Laterality: Right;   IR RADIOLOGIST EVAL & MGMT  11/16/2016   IR RADIOLOGIST EVAL & MGMT  12/08/2016   IR RADIOLOGIST EVAL & MGMT  12/16/2016   LAMINECTOMY  2010   T1-2; Dr Juliette Alcide PLANA VITRECTOMY Right 09/16/2020   Procedure: PARS PLANA VITRECTOMY WITH 25 GAUGE With LASER;  Surgeon: Jalene Mullet, MD;  Location: [place];  Service: Ophthalmology;  Laterality: Right;   PARTIAL COLECTOMY N/A 03/18/2017   Procedure: OPEN SIGMOIDECTOMY;  Surgeon: Leighton Ruff, MD;  Location: [facility];  Service: General;  Laterality: N/A;   REPAIR OF COMPLEX TRACTION RETINAL DETACHMENT Right 09/16/2020   Procedure: REPAIR OF COMPLEX TRACTION RETINAL DETACHMENT;  Surgeon: Jalene Mullet, MD;  Location: [place];  Service: Ophthalmology;  Laterality: Right;   SHOULDER SURGERY     Left & Right   THORACENTESIS     for peripneumonic effusion   TOTAL ABDOMINAL HYSTERECTOMY W/ BILATERAL  SALPINGOOPHORECTOMY     For Fibroids & endometriosis   TOTAL SHOULDER ARTHROPLASTY     UMBILICAL HERNIA REPAIR N/A 03/18/2017   Procedure: OPEN HERNIA REPAIR UMBILICAL ADULT;  Surgeon: Leighton Ruff, MD;  Location: [facility];  Service: General;  Laterality: N/A;  ERAs pathway     Current Meds  Medication Sig   betamethasone dipropionate 0.05 % cream Apply topically 2 (two) times daily. (Patient taking differently: Apply 1 application topically 2 (two) times daily.)   CALCIUM PO Take 1,000 mg by mouth at bedtime.   cholecalciferol (VITAMIN D) 1000 units tablet Take 1,000 Units by mouth at bedtime.   diazepam (VALIUM) 5 MG tablet TAKE 1 TABLET BY MOUTH EVERY 12 HOURS AS NEEDED FOR ANXIETY   ELIQUIS 5 MG TABS tablet TAKE 1 TABLET BY MOUTH TWICE A DAY APPOINTMENT REQUIRED FOR FURTHER REFILLS [phone] (Patient taking differently: Take 5 mg by mouth  2 (two) times daily.)   flecainide (TAMBOCOR) 150 MG tablet Take 1 tablet (150 mg total) by mouth as needed (for AFIB).   fluticasone (FLONASE) 50 MCG/ACT nasal spray Place 2 sprays into both nostrils daily.   furosemide (LASIX) 40 MG tablet TAKE 1/2 TABLET BY MOUTH EVERY DAY (Patient taking differently: Take 20 mg by mouth daily.)   losartan (COZAAR) 100 MG tablet TAKE 1 TABLET BY MOUTH EVERY DAY (Patient taking differently: Take 100 mg by mouth daily.)   Melatonin 10 MG CAPS Take 1 capsule by mouth at bedtime.   Multiple Vitamin (MULTIVITAMIN WITH MINERALS) TABS tablet Take 1 tablet by mouth at bedtime.   ofloxacin (OCUFLOX) 0.3 % ophthalmic solution Place 1 drop into the right eye 4 (four) times daily.   prednisoLONE acetate (PRED FORTE) 1 % ophthalmic suspension Place into the right eye.   predniSONE (DELTASONE) 5 MG tablet Take 1 tablet (5 mg total) by mouth daily with breakfast.   pyridostigmine (MESTINON) 60 MG tablet Take 1 tablet (60 mg total) by mouth 3 (three) times daily.   pyridOXINE (VITAMIN B-6) 100 MG tablet Take 100 mg by mouth at  bedtime.   spironolactone (ALDACTONE) 25 MG tablet TAKE 1 TABLET BY MOUTH EVERY DAY (Patient taking differently: Take 25 mg by mouth daily.)   vitamin B-12 (CYANOCOBALAMIN) 1000 MCG tablet Take 1,000 mcg by mouth at bedtime.     Allergies:   Azithromycin, Ibuprofen, Levaquin [levofloxacin in d5w], Tetracycline, Isovue [iopamidol], Sulfonamide derivatives, Aleve [naproxen], Tape, Zosyn [piperacillin sod-tazobactam so], Amlodipine besy-benazepril hcl, and Amoxicillin   Social History   Tobacco Use   Smoking status: Never   Smokeless tobacco: Never  Vaping Use   Vaping Use: Never used  Substance Use Topics   Alcohol use: No    Alcohol/week: 0.0 standard drinks   Drug use: No     Family Hx: The patient's family history includes Cancer in her father; Heart attack (age of onset: 11) in her mother; Heart failure in her mother; Hypertension in her sister; Hyperthyroidism in her sister; Lung cancer in her father; Stroke in her paternal grandmother. There is no history of Diabetes, Colon cancer, Esophageal cancer, Stomach cancer, or Rectal cancer.  ROS:   Please see the history of present illness.    (+) LE edema in legs- black spots (+) shortness of breath  (+) anxiety  All other systems reviewed and are negative.   Prior CV studies:   The following studies were reviewed today:  CARDIAC TELEMETRY 5/19:   30 Day Event Monitor   Quality: Fair.  Baseline artifact. Predominant rhythm: sinus rhythm. Average heart rate: 68 bpm   No arrhythmias noted.  ECHO 3/19:  Impressions:   - Normal LV size with mild LV hypertrophy. EF 55-60%. Normal RV    size and systolic function. No significant valvular    abnormalities.   Echo 05/26/15: Study Conclusions   - Left ventricle: The cavity size was mildly dilated. Systolic   function was normal. The estimated ejection fraction was in the   range of 60% to 65%. Wall motion was normal; there were no   regional wall motion abnormalities.  Left ventricular diastolic   function parameters were normal. - Aortic valve: Transvalvular velocity was within the normal range.   There was no stenosis. There was no regurgitation. - Mitral valve: There was no regurgitation. - Left atrium: The atrium was mildly dilated. - Right ventricle: The cavity size was normal. Wall thickness was   normal.  Systolic function was normal. - Tricuspid valve: There was no regurgitation. - Inferior vena cava: The vessel was normal in size. The   respirophasic diameter changes were in the normal range (= 50%),   consistent with normal central venous pressure.     28 Day Event Monitor 04/01/15:   Quality: Fair.  Baseline artifact. Predominant rhythm: sinus rhythm Average heart rate: 70 bpm Max heart rate: 126 bpm Min heart rate: 50 bpm   Short runs of atrial tachycardia lasting up to 11 seconds.  One 6 beat run of NSVT.    Lexiscan Cardiolite 03/28/15: Nuclear stress EF: 71%. The left ventricular ejection fraction is hyperdynamic (>65%). There was no ST segment deviation noted during stress. This is an intermediate risk study. Findings consistent with ischemia.   Technically difficult study due to increased subdiaphragmatic activity; intermediate risk with small, severe, reversible defects in the apical and inferior basal walls consistent with ischemia; EF 71 with normal wall motion.    LHC 04/17/15: The left ventricular systolic function is normal. Angiographically normal coronary arteries, but very tortuous     Angiographically no evidence of any significant lesions to explain the patient's abnormal stress test. There does appear to be mild calcification in the LAD distribution, but certainly not on the luminal aspect.  Labs/Other Tests and Data Reviewed:    EKG:   An ECG dated 03/22/18 was personally reviewed today and demonstrated:  sinus rhythm.  Rate 77 bpm.  Non-specific ST-T changes   03/26/2020: Sinus rhythm.  Rate 79 bpm.  Nonspecific T  wave abnormalities. 9/22: sinus rhythm. Rate 68 bpm.   Recent Labs: 03/26/2020: ALT 21; Hemoglobin 13.4; Platelets 330 09/16/2020: BUN 18; Creatinine, Ser 0.90; Potassium 3.8; Sodium 137   Recent Lipid Panel Lab Results  Component Value Date/Time   CHOL 256 (H) 08/09/2014 09:28 AM   TRIG 180 (H) 08/09/2014 09:28 AM   HDL 74 08/09/2014 09:28 AM   LDLCALC 146 (H) 08/09/2014 09:28 AM    Wt Readings from Last 3 Encounters:  09/19/20 297 lb (134.7 kg)  03/26/20 298 lb (135.2 kg)  09/19/19 294 lb (133.4 kg)     Objective:    VS:  BP (!) 126/52   Pulse 68   Ht '5\' 2"'$  (1.575 m)   Wt 297 lb (134.7 kg)   BMI 54.32 kg/m  , BMI Body mass index is 54.32 kg/m. GENERAL:  Well appearing HEENT: Pupils equal round and reactive, fundi not visualized, oral mucosa unremarkable NECK:  No jugular venous distention, waveform within normal limits, carotid upstroke brisk and symmetric, no bruits, no thyromegaly LUNGS:  Clear to auscultation bilaterally HEART:  RRR.  PMI not displaced or sustained,S1 and S2 within normal limits, no S3, no S4, no clicks, no rubs, no murmurs ABD:  Central adiposity.  positive bowel sounds normal in frequency in pitch, no bruits, no rebound, no guarding, no midline pulsatile mass, no hepatomegaly, no splenomegaly EXT:  2 plus pulses throughout, 2+ R LE edema.  1+ LLE edema, no cyanosis no clubbing SKIN:  No rashes no nodules NEURO:  Cranial nerves II through XII grossly intact, motor grossly intact throughout PSYCH:  Cognitively intact, oriented to person place and time   ASSESSMENT & PLAN:    Coronary artery calcification seen on CAT scan Asymptomatic, however she is not very physically active.  She had a cath and did not have any significant CAD in 2017.  She has not had lipids checked in a while.  She goes to  see her PCP soon and will have them checked at that time.  No aspirin given that she is on Eliquis.  No statin given her myasthenia.  Essential  hypertension Blood pressure is very well-controlled on losartan and spironolactone.  Hyperlipidemia Fasting lipids to be checked with her PCP.  Paroxysmal atrial fibrillation (HCC) Today she is in sinus rhythm.  She has had very infrequent episodes.  Continue Eliquis and flecainide.  She is not a candidate for beta-blocker due to her myasthenia gravis.    Medication Adjustments/Labs and Tests Ordered: Current medicines are reviewed at length with the patient today.  Concerns regarding medicines are outlined above.   Tests Ordered: No orders of the defined types were placed in this encounter.   Medication Changes: No orders of the defined types were placed in this encounter.   Follow Up: with Bryston Colocho C. Oval Linsey, MD, [facility] in 6 months  I,Jada Bradford,acting as a Education administrator for Skeet Latch, MD.,have documented all relevant documentation on the behalf of Skeet Latch, MD,as directed by  Skeet Latch, MD while in the presence of Skeet Latch, MD.  I, [place] Oval Linsey, MD have reviewed all documentation for this visit.  The documentation of the exam, diagnosis, procedures, and orders on 09/19/2020 are all accurate and complete.  Signed, Skeet Latch, MD  09/19/2020 4:08 PM    [facility]

## 2020-09-19 NOTE — Assessment & Plan Note (Signed)
Fasting lipids to be checked with her PCP.

## 2020-09-19 NOTE — Assessment & Plan Note (Signed)
Today she is in sinus rhythm.  She has had very infrequent episodes.  Continue Eliquis and flecainide.  She is not a candidate for beta-blocker due to her myasthenia gravis.

## 2020-09-30 ENCOUNTER — Ambulatory Visit: Payer: Medicare Other

## 2020-10-01 DIAGNOSIS — H35362 Drusen (degenerative) of macula, left eye: Secondary | ICD-10-CM | POA: Diagnosis not present

## 2020-10-01 DIAGNOSIS — H35452 Secondary pigmentary degeneration, left eye: Secondary | ICD-10-CM | POA: Diagnosis not present

## 2020-10-01 DIAGNOSIS — H353121 Nonexudative age-related macular degeneration, left eye, early dry stage: Secondary | ICD-10-CM | POA: Diagnosis not present

## 2020-10-07 ENCOUNTER — Other Ambulatory Visit: Payer: Self-pay | Admitting: Internal Medicine

## 2020-10-13 ENCOUNTER — Other Ambulatory Visit: Payer: Self-pay

## 2020-10-13 ENCOUNTER — Ambulatory Visit (INDEPENDENT_AMBULATORY_CARE_PROVIDER_SITE_OTHER): Payer: Medicare Other | Admitting: Pharmacist

## 2020-10-13 DIAGNOSIS — I480 Paroxysmal atrial fibrillation: Secondary | ICD-10-CM

## 2020-10-13 DIAGNOSIS — F411 Generalized anxiety disorder: Secondary | ICD-10-CM

## 2020-10-13 DIAGNOSIS — I10 Essential (primary) hypertension: Secondary | ICD-10-CM

## 2020-10-13 DIAGNOSIS — I2510 Atherosclerotic heart disease of native coronary artery without angina pectoris: Secondary | ICD-10-CM

## 2020-10-13 DIAGNOSIS — E785 Hyperlipidemia, unspecified: Secondary | ICD-10-CM

## 2020-10-13 DIAGNOSIS — G7000 Myasthenia gravis without (acute) exacerbation: Secondary | ICD-10-CM

## 2020-10-13 NOTE — Patient Instructions (Signed)
Visit Information  Phone number for Pharmacist: [phone]   Goals Addressed             This Visit's Progress    Manage My Medicine       Timeframe:  Long-Range Goal Priority:  Medium Start Date:     10/13/20                        Expected End Date:    10/13/21                   Follow Up Date April 2023   - call for medicine refill 2 or 3 days before it runs out - call if I am sick and can't take my medicine - keep a list of all the medicines I take; vitamins and herbals too - use a pillbox to sort medicine  -Try OTC probiotic for diarrhea -Get covid booster and flu, shingles and tetanus vaccines   Why is this important?   These steps will help you keep on track with your medicines.   Notes:         Care Plan : [place]  Updates made by Charlton Haws, RPH since 10/13/2020 12:00 AM     Problem: Hypertension, Hyperlipidemia, Atrial Fibrillation and Osteoarthritis   Priority: High     Long-Range Goal: Disease management   Start Date: 04/08/2020  Expected End Date: 10/13/2021  This Visit's Progress: On track  Recent Progress: On track  Priority: High  Note:   Current Barriers:  Unable to independently monitor therapeutic efficacy  Pharmacist Clinical Goal(s):  Patient will achieve adherence to monitoring guidelines and medication adherence to achieve therapeutic efficacy through collaboration with PharmD and provider.   Interventions: 1:1 collaboration with [name], MD regarding development and update of comprehensive plan of care as evidenced by provider attestation and co-signature Inter-disciplinary care team collaboration (see longitudinal plan of care) Comprehensive medication review performed; medication list updated in electronic medical record  AFIB/Hypertension    BP goal < 140/90. HR goal <110 Patient is currently rate controlled. Patient checks BP at home daily Patient home BP readings are ranging: 130s/70s    Patient is currently controlled on the following medications:  Losartan 100 mg daily Spironolactone 25 mg daily Furosemide 40 mg - 1/2 tab daily Flecainide 150 mg as needed Eliquis 5 mg BID   We discussed: pt has not required flecainide for Afib episodes; she denies issues with bleeding; she denies issues with BP   Plan: Continue current medications and control with diet and exercise    Hyperlipidemia    LDL goal < 70 No medications - avoiding statins due to myasthenia gravis   We discussed:  pt is overdue for lipid panel, advised pt to set up PCP visit   Plan: Advied pt to make appt with PCP; recommend fasting lipid panel   Anxiety  -Controlled - pt reports anxiety is worse at night, she is not sure why; she usually takes diazepam at night to help with sleep -Current treatment: Diazepam 5 mg q12h PRN -Counseled on benzodiazepine side effects; advised to use lowest effective dose  Myasthenia Gravis    Follows with Dr Estanislado Pandy Patient has failed these meds in past: ibuprofen, naproxen, tylenol, Voltaren gel (ineffective) Patient is currently uncontrolled on the following medications:  Prednisone 5 mg daily Pyridostigimine 60 mg TID Tylenol PRN   We discussed:  Plan: Continue current medication  Allergic rhinitis  Patient has failed these meds in past: flonase Patient is currently controlled on the following medications:  Saline nasal spray  We discussed:  pt reports flonase nasal spray made her symptoms worse - she complains of nasal dryness, and saline nasal spray helps; discussed that nasal steroids, antihistamines have a drying effect and should be avoided if dryness is her main problem; if she suffers from other allergic symptoms occasionally a temporary antihistamine (claritin, zyrtec, allegra) would be beneficial  Plan: Continue current medications Consider OTC antihistamine PRN for allergic symptoms  Diarrhea  We discussed:  Pt reports ongoing issues with  diarrhea; she does not like to leave the house for long periods of time for fear of having an accident; she drinks water, decaf eat, and lemonade with artificial sweetener throughout the day -Discussed diarrhea is a common side effect of pyridostigmine due to it's cholinergic effects -discussed food triggers including artificial sweeteners -discussed potential benefits of fiber supplement, probiotics  Plan: Start OTC probiotic (Align or similar); avoid food triggers  Health maintenance  -Vaccine gaps: Flu, covid booster, Shingrix, TDAP  -Screening gaps: Mammogram, DEXA, Hepatitis C -Counseled on vaccines - advised new Shingles vaccine is inactive so she can get it even with MG/autoimmune disease -Advised to schedule PCP visit to discuss missing screenings   Patient Goals/Self-Care Activities Patient will:  - take medications as prescribed -focus on medication adherence by pill box -check blood pressure weekly -Try OTC probiotic for diarrhea -Get covid booster and flu, shingles and tetanus vaccines      The patient verbalized understanding of instructions, educational materials, and care plan provided today and declined offer to receive copy of patient instructions, educational materials, and care plan.  Telephone follow up appointment with pharmacy team member scheduled for: 6 months  Charlene Brooke, PharmD, [place], CPP Clinical Pharmacist [facility] [phone]

## 2020-10-13 NOTE — Progress Notes (Signed)
Chronic Care Management Pharmacy Note  10/13/2020 Name:  Kimberly [name] MRN:  [id number] DOB:  [birth year]  Summary: -Pt reports ongoing diarrhea for at least a year; she limits time away from her home due to this; counseled pyridostigmine may be contributing but agreed benefits > risks for this medication -Pt is overdue for in-person PCP visit; she has not had lipid panel since 2016 (we are avoiding statins due to MG) -Pt is due for Mammogram, DEXA and Hepatitis C screenings - she reports these screenings have fallen off her radar due to other health problems taking precedence but she would like to get back on track  Recommendations/Changes made from today's visit: -Try OTC probiotic (Align or similar) and fiber supplement  -Advised pt to schedule in-person PCP visit; recommend repeat lipid panel and schedule health maintenance screenings   Subjective: Kimberly [name] is an 75 y.o. year old female who is a primary patient of Burns, Claudina Lick, MD.  The CCM team was consulted for assistance with disease management and care coordination needs.    Engaged with patient by telephone for follow up visit in response to provider referral for pharmacy case management and/or care coordination services.   Consent to Services:  The patient was given information about Chronic Care Management services, agreed to services, and gave verbal consent prior to initiation of services.  Please see initial visit note for detailed documentation.   Patient Care Team: [name], MD as PCP - General (Internal Medicine) Skeet Latch, MD as PCP - Cardiology (Cardiology) Charlton Haws, [facility] as Pharmacist (Pharmacist)  Patient lives with husband of 1 years, both retired now. Husband is active, she needs walker to do most things - knee trouble, but wants to avoid surgery.  Recent office visits: 06/30/20 Dr Sharlet Salina VV: sinus issues, anxiety. Rx'd diazepam, flonase.  04/14/20 Dr Quay Burow VV: sinusitis  - rx'd Keflex, Hycodan.  Recent consult visits: 09/19/20 Dr Oval Linsey (cardiology): f/u Afib. No BB or statin d/t MG.  08/07/20 PA Quita Skye (rheumatology): f/u psoriasis, MG. C/o rash. Rx'd betamethasone cream.  03/26/20 Dr Oval Linsey (cardiology): using flecainide PRN. No significant CAD on cath. Avoid statins or nodal agents d/t hx myasthenia.   Hospital visits: 9/6/2 retinal detachment repair None in previous 6 months  Objective:  Lab Results  Component Value Date   CREATININE 0.90 09/16/2020   BUN 18 09/16/2020   GFR 58.27 (L) 08/20/2016   GFRNONAA >60 09/16/2020   GFRAA >60 03/22/2018   NA 137 09/16/2020   K 3.8 09/16/2020   CALCIUM 9.2 09/16/2020   CO2 25 09/16/2020   GLUCOSE 103 (H) 09/16/2020    Lab Results  Component Value Date/Time   HGBA1C 5.3 08/09/2014 09:28 AM   GFR 58.27 (L) 08/20/2016 11:14 AM   GFR 89.82 02/17/2015 04:16 PM    Last diabetic Eye exam: No results found for: HMDIABEYEEXA  Last diabetic Foot exam: No results found for: HMDIABFOOTEX   Lab Results  Component Value Date   CHOL 256 (H) 08/09/2014   HDL 74 08/09/2014   LDLCALC 146 (H) 08/09/2014   TRIG 180 (H) 08/09/2014    Hepatic Function Latest Ref Rng & Units 03/26/2020 10/12/2016 08/20/2016  Total Protein 6.0 - 8.5 g/dL 6.9 6.5 7.6  Albumin 3.7 - 4.7 g/dL 4.2 3.1(L) 3.9  AST 0 - 40 IU/L '28 30 16  ' ALT 0 - 32 IU/L '21 26 15  ' Alk Phosphatase 44 - 121 IU/L 66 54 64  Total Bilirubin 0.0 -  1.2 mg/dL 0.5 0.7 0.4  Bilirubin, Direct 0.0 - 0.3 mg/dL - - -    Lab Results  Component Value Date/Time   TSH 2.838 03/22/2018 03:30 PM   TSH 3.152 03/24/2017 09:22 PM   TSH 2.20 08/09/2014 09:28 AM   TSH 1.78 01/24/2007 11:23 AM    CBC Latest Ref Rng & Units 03/26/2020 03/22/2018 04/08/2017  WBC 3.4 - 10.8 x10E3/uL 12.3(H) 11.5(H) 9.6  Hemoglobin 11.1 - 15.9 g/dL 13.4 12.5 9.8(L)  Hematocrit 34.0 - 46.6 % 39.3 39.6 30.5(L)  Platelets 150 - 450 x10E3/uL 330 299 481(H)    No results found for:  VD25OH  Clinical ASCVD: No  The ASCVD Risk score (Arnett DK, et al., 2019) failed to calculate for the following reasons:   Cannot find a previous HDL lab   Cannot find a previous total cholesterol lab    Depression screen [facility] 2/9 05/21/2016 05/09/2015  Decreased Interest 0 0  Down, Depressed, Hopeless 0 0  PHQ - 2 Score 0 0  Some recent data might be hidden     CHA2DS2-VASc Score = 4  The patient's score is based upon: CHF History: 0 HTN History: 1 Diabetes History: 0 Stroke History: 0 Vascular Disease History: 1 Age Score: 1 Gender Score: 1     Social History   Tobacco Use  Smoking Status Never  Smokeless Tobacco Never   BP Readings from Last 3 Encounters:  09/19/20 (!) 126/52  09/16/20 (!) 131/91  08/07/20 (!) 165/69   Pulse Readings from Last 3 Encounters:  09/19/20 68  09/16/20 81  08/07/20 85   Wt Readings from Last 3 Encounters:  09/19/20 297 lb (134.7 kg)  03/26/20 298 lb (135.2 kg)  09/19/19 294 lb (133.4 kg)   BMI Readings from Last 3 Encounters:  09/19/20 54.32 kg/m  08/07/20 54.50 kg/m  03/26/20 54.50 kg/m    Assessment/Interventions: Review of patient past medical history, allergies, medications, health status, including review of consultants reports, laboratory and other test data, was performed as part of comprehensive evaluation and provision of chronic care management services.   SDOH:  (Social Determinants of Health) assessments and interventions performed: Yes  SDOH Screenings   Alcohol Screen: Not on file  Depression (PHQ2-9): Not on file  Financial Resource Strain: Not on file  Food Insecurity: Not on file  Housing: Not on file  Physical Activity: Not on file  Social Connections: Not on file  Stress: Not on file  Tobacco Use: Low Risk    Smoking Tobacco Use: Never   Smokeless Tobacco Use: Never  Transportation Needs: Not on file    CCM Care Plan  Allergies  Allergen Reactions   Azithromycin Other (See Comments)     Angioedema   Ibuprofen     800 mg - edema all over, including lips and tongue   Levaquin [Levofloxacin In D5w] Other (See Comments)    D/T MYASTHENIA GRAVIS   Tetracycline Rash   Isovue [Iopamidol] Dermatitis    Pt had a rash and blisters on bilat feet and lower part of legs.  Full premeds in the future.  This happened twice after IV contrast.   J Bohm  RTRCT   Sulfonamide Derivatives     Seizure age 89 (Note: probably fever induced)   Aleve [Naproxen] Other (See Comments)    Caused excessive salivation, throat tightness   Tape Other (See Comments)    Causes blisters  Paper tape only   Zosyn [Piperacillin Sod-Tazobactam So]     Severe  leg rash, skin peeling   Amlodipine Besy-Benazepril Hcl Cough   Amoxicillin Swelling and Rash    Has patient had a PCN reaction causing immediate rash, facial/tongue/throat swelling, SOB or lightheadedness with hypotension: No Has patient had a PCN reaction causing severe rash involving mucus membranes or skin necrosis: No Has patient had a PCN reaction that required hospitalization: No Has patient had a PCN reaction occurring within the last 10 years: Yes If all of the above answers are "NO", then may proceed with Cephalosporin use.     Medications Reviewed Today     Reviewed by Charlton Haws, [facility] (Pharmacist) on 10/13/20 at 1125  Med List Status: <None>   Medication Order Taking? Sig Documenting Provider Last Dose Status Informant  betamethasone dipropionate 0.05 % cream [id number] Yes Apply topically 2 (two) times daily.  Patient taking differently: Apply 1 application topically 2 (two) times daily.   Ofilia Neas, PA-C Taking Active Self           Med Note Luna Glasgow Oct 13, 2020 11:23 AM)    CALCIUM PO [id number] Yes Take 1,000 mg by mouth at bedtime. [provider] Taking Active Self  cholecalciferol (VITAMIN D) 1000 units tablet [id number] Yes Take 1,000 Units by mouth at bedtime. [provider]  Taking Active Self  diazepam (VALIUM) 5 MG tablet [id number] Yes TAKE 1 TABLET BY MOUTH EVERY 12 HOURS AS NEEDED FOR ANXIETY Burns, Claudina Lick, MD Taking Active   ELIQUIS 5 MG TABS tablet [id number] Yes TAKE 1 TABLET BY MOUTH TWICE A DAY APPOINTMENT REQUIRED FOR FURTHER REFILLS [phone]  Patient taking differently: Take 5 mg by mouth 2 (two) times daily.   Berniece Salines, DO Taking Active Self  flecainide (TAMBOCOR) 150 MG tablet [id number] Yes Take 1 tablet (150 mg total) by mouth as needed (for AFIB). Skeet Latch, MD Taking Active Self  furosemide (LASIX) 40 MG tablet [id number] Yes TAKE 1/2 TABLET BY MOUTH EVERY DAY  Patient taking differently: Take 20 mg by mouth daily.   [name], MD Taking Active Self  losartan (COZAAR) 100 MG tablet [id number] Yes TAKE 1 TABLET BY MOUTH EVERY DAY [name], MD Taking Active   Melatonin 10 MG CAPS [id number] Yes Take 1 capsule by mouth at bedtime. [provider] Taking Active Self  Multiple Vitamin (MULTIVITAMIN WITH MINERALS) TABS tablet [id number] Yes Take 1 tablet by mouth at bedtime. [provider] Taking Active Self    Discontinued 10/13/20 1124 (Completed Course)   predniSONE (DELTASONE) 5 MG tablet [id number] Yes Take 1 tablet (5 mg total) by mouth daily with breakfast. Bo Merino, MD Taking Active Self  pyridostigmine (MESTINON) 60 MG tablet [id number] Yes Take 1 tablet (60 mg total) by mouth 3 (three) times daily. Narda Amber K, DO Taking Active Self  pyridOXINE (VITAMIN B-6) 100 MG tablet [id number] Yes Take 100 mg by mouth at bedtime. [provider] Taking Active Self  spironolactone (ALDACTONE) 25 MG tablet [id number] Yes TAKE 1 TABLET BY MOUTH EVERY DAY  Patient taking differently: Take 25 mg by mouth daily.   Skeet Latch, MD Taking Active Self  vitamin B-12 (CYANOCOBALAMIN) 1000 MCG tablet [id number] Yes Take 1,000 mcg by mouth at bedtime. [provider] Taking Active Self             Patient Active Problem List   Diagnosis Date Noted   Retinal detachment, right 09/17/2020   GAD (generalized anxiety disorder) 06/30/2020   Nose disorder  04/14/2020   Diarrhea 03/10/2018   Diverticular stricture ([place]) 03/18/2017   Sleep difficulties 10/31/2016   Bilateral leg edema 08/20/2016   Recurrent UTI 08/15/2016   Psoriasis 04/13/2016   History of diverticulitis 04/13/2016   Osteopenia of multiple sites 04/13/2016   DJD (degenerative joint disease), cervical 04/07/2016   Primary osteoarthritis of both knees 04/07/2016   Paroxysmal atrial fibrillation ([place]) 05/12/2015   NSVT (nonsustained ventricular tachycardia) 05/12/2015   Abnormal nuclear stress test - INTERMEDIATE RISK 04/14/2015   Coronary artery calcification seen on CAT scan 02/28/2015   Hyperglycemia 08/09/2014   Dyspnea 94/17/4081   Umbilical hernia 44/81/8563   Pleural effusion 05/10/2012   Myasthenia gravis without exacerbation ([place]) 02/12/2010   Vitamin D deficiency 06/25/2008   Hyperlipidemia 06/25/2008   Essential hypertension 06/25/2008   Leukocytoclastic vasculitis ([place]) 06/19/2007   ANA POSITIVE, HX OF 01/24/2007    Immunization History  Administered Date(s) Administered   Influenza, High Dose Seasonal PF 09/11/2012, 11/01/2016, 11/24/2017   Influenza,inj,Quad PF,6+ Mos 10/12/2014   Influenza-Unspecified 10/11/2013, 10/24/2015   PFIZER(Purple Top)SARS-COV-2 Vaccination 07/31/2020   Pneumococcal Conjugate-13 05/09/2015   Pneumococcal Polysaccharide-23 04/19/2013    Conditions to be addressed/monitored:  Hypertension, Hyperlipidemia, Atrial Fibrillation and Osteoarthritis  Care Plan : CCM Pharmacy Care Plan  Updates made by Charlton Haws, [place] since 10/13/2020 12:00 AM     Problem: Hypertension, Hyperlipidemia, Atrial Fibrillation and Osteoarthritis   Priority: High     Long-Range Goal: Disease management   Start Date: 04/08/2020  Expected End Date: 10/13/2021  This Visit's  Progress: On track  Recent Progress: On track  Priority: High  Note:   Current Barriers:  Unable to independently monitor therapeutic efficacy  Pharmacist Clinical Goal(s):  Patient will achieve adherence to monitoring guidelines and medication adherence to achieve therapeutic efficacy through collaboration with PharmD and provider.   Interventions: 1:1 collaboration with [name], MD regarding development and update of comprehensive plan of care as evidenced by provider attestation and co-signature Inter-disciplinary care team collaboration (see longitudinal plan of care) Comprehensive medication review performed; medication list updated in electronic medical record  AFIB/Hypertension    BP goal < 140/90. HR goal <110 Patient is currently rate controlled. Patient checks BP at home daily Patient home BP readings are ranging: 130s/70s   Patient is currently controlled on the following medications:  Losartan 100 mg daily Spironolactone 25 mg daily Furosemide 40 mg - 1/2 tab daily Flecainide 150 mg as needed Eliquis 5 mg BID   We discussed: pt has not required flecainide for Afib episodes; she denies issues with bleeding; she denies issues with BP   Plan: Continue current medications and control with diet and exercise    Hyperlipidemia    LDL goal < 70 No medications - avoiding statins due to myasthenia gravis   We discussed:  pt is overdue for lipid panel, advised pt to set up PCP visit   Plan: Advied pt to make appt with PCP; recommend fasting lipid panel   Anxiety  -Controlled - pt reports anxiety is worse at night, she is not sure why; she usually takes diazepam at night to help with sleep -Current treatment: Diazepam 5 mg q12h PRN -Counseled on benzodiazepine side effects; advised to use lowest effective dose  Myasthenia Gravis    Follows with Dr Estanislado Pandy Patient has failed these meds in past: ibuprofen, naproxen, tylenol, Voltaren gel  (ineffective) Patient is currently uncontrolled on the following medications:  Prednisone 5 mg daily Pyridostigimine 60 mg TID Tylenol PRN  We discussed:  Plan: Continue current medication  Allergic rhinitis   Patient has failed these meds in past: flonase Patient is currently controlled on the following medications:  Saline nasal spray  We discussed:  pt reports flonase nasal spray made her symptoms worse - she complains of nasal dryness, and saline nasal spray helps; discussed that nasal steroids, antihistamines have a drying effect and should be avoided if dryness is her main problem; if she suffers from other allergic symptoms occasionally a temporary antihistamine (claritin, zyrtec, allegra) would be beneficial  Plan: Continue current medications Consider OTC antihistamine PRN for allergic symptoms  Diarrhea  We discussed:  Pt reports ongoing issues with diarrhea; she does not like to leave the house for long periods of time for fear of having an accident; she drinks water, decaf eat, and lemonade with artificial sweetener throughout the day -Discussed diarrhea is a common side effect of pyridostigmine due to it's cholinergic effects -discussed food triggers including artificial sweeteners -discussed potential benefits of fiber supplement, probiotics  Plan: Start OTC probiotic (Align or similar); avoid food triggers  Health maintenance  -Vaccine gaps: Flu, covid booster, Shingrix, TDAP  -Screening gaps: Mammogram, DEXA, Hepatitis C -Counseled on vaccines - advised new Shingles vaccine is inactive so she can get it even with MG/autoimmune disease -Advised to schedule PCP visit to discuss missing screenings   Patient Goals/Self-Care Activities Patient will:  - take medications as prescribed -focus on medication adherence by pill box -check blood pressure weekly -Try OTC probiotic for diarrhea -Get covid booster and flu, shingles and tetanus vaccines       Compliance/Adherence/Medication fill history: Care Gaps: Hep C screening Mammogram DEXA scan -Advised to schedule PCP visit to discuss missing screenings  Star-Rating Drugs: Losartan - LF 09/09/20 x 30 ds  Medication Assistance: None required.  Patient affirms current coverage meets needs.  Patient's preferred pharmacy is:  [id number]- [place], [place] - [id number]: [phone]: [phone] Uses pill box? Yes Pt endorses 100% compliance  We discussed: Current pharmacy is preferred with insurance plan and patient is satisfied with pharmacy services Patient decided to: Continue current medication management strategy  Care Plan and Follow Up Patient Decision:  Patient agrees to Care Plan and Follow-up.  Plan: Telephone follow up appointment with care management team member scheduled for:  6 months    LCharlene Brooke PharmD, [place] CPP Clinical Pharmacist [phone]

## 2020-10-17 ENCOUNTER — Other Ambulatory Visit (HOSPITAL_COMMUNITY): Payer: Self-pay | Admitting: Cardiology

## 2020-10-17 NOTE — Telephone Encounter (Signed)
Prescription refill request for Eliquis received. Indication:atrial fib Last office visit:9/22 Scr:0.9 Age: 75 Weight:134.7 kg  Prescription refilled

## 2020-10-22 DIAGNOSIS — H35363 Drusen (degenerative) of macula, bilateral: Secondary | ICD-10-CM | POA: Diagnosis not present

## 2020-10-22 DIAGNOSIS — H353131 Nonexudative age-related macular degeneration, bilateral, early dry stage: Secondary | ICD-10-CM | POA: Diagnosis not present

## 2020-10-25 ENCOUNTER — Other Ambulatory Visit: Payer: Self-pay | Admitting: Physician Assistant

## 2020-10-25 ENCOUNTER — Other Ambulatory Visit: Payer: Self-pay | Admitting: Neurology

## 2020-10-25 ENCOUNTER — Other Ambulatory Visit: Payer: Self-pay | Admitting: Internal Medicine

## 2020-10-25 DIAGNOSIS — I10 Essential (primary) hypertension: Secondary | ICD-10-CM

## 2020-10-27 NOTE — Telephone Encounter (Signed)
LMOM for patient to call and schedule 1 year follow-up appointment.

## 2020-10-27 NOTE — Telephone Encounter (Signed)
Next Visit: due July 2023. Message sent to the front to schedule patient    Last Visit: 08/07/2020   Last Fill: 08/07/2020, Prednisone 08/04/2020   Dx: Psoriasis   Current Dose per office note on 08/07/2020: betamethasone dipropionate 0.05% cream Prednisone 5 mg 1 tablet by mouth daily   Okay to refill betamethasone dipropionate cream and Prednisone?

## 2020-10-27 NOTE — Telephone Encounter (Signed)
Patient is requesting a refill of the following medications: Requested Prescriptions   Pending Prescriptions Disp Refills   diazepam (VALIUM) 5 MG tablet [Pharmacy Med Name: DIAZEPAM 5 MG TABLET] 45 tablet 0    Sig: TAKE 1 TABLET BY MOUTH EVERY 12 HOURS AS NEEDED FOR ANXIETY   Signed Prescriptions Disp Refills   furosemide (LASIX) 40 MG tablet 45 tablet 0    Sig: TAKE 1/2 TABLET BY MOUTH EVERY DAY    Authorizing Provider: [name]    Ordering User: Durwin Nora    Date of patient request: 10/27/20  Last office visit: 04/08/20 Date of last refill: 09/17/20 Last refill amount: 45 Follow up time period per chart: n/a

## 2020-10-27 NOTE — Telephone Encounter (Signed)
Please schedule patient for a follow up visit. Patient due July 2023. Thanks!

## 2020-10-28 DIAGNOSIS — H33011 Retinal detachment with single break, right eye: Secondary | ICD-10-CM | POA: Diagnosis not present

## 2020-10-28 DIAGNOSIS — H35371 Puckering of macula, right eye: Secondary | ICD-10-CM | POA: Diagnosis not present

## 2020-10-28 DIAGNOSIS — H43391 Other vitreous opacities, right eye: Secondary | ICD-10-CM | POA: Diagnosis not present

## 2020-10-28 DIAGNOSIS — Z8669 Personal history of other diseases of the nervous system and sense organs: Secondary | ICD-10-CM | POA: Diagnosis not present

## 2020-10-28 DIAGNOSIS — Z48810 Encounter for surgical aftercare following surgery on the sense organs: Secondary | ICD-10-CM | POA: Diagnosis not present

## 2020-10-29 DIAGNOSIS — H35363 Drusen (degenerative) of macula, bilateral: Secondary | ICD-10-CM | POA: Diagnosis not present

## 2020-10-29 DIAGNOSIS — H338 Other retinal detachments: Secondary | ICD-10-CM | POA: Diagnosis not present

## 2020-10-30 ENCOUNTER — Other Ambulatory Visit: Payer: Self-pay | Admitting: Internal Medicine

## 2020-11-05 DIAGNOSIS — H35363 Drusen (degenerative) of macula, bilateral: Secondary | ICD-10-CM | POA: Diagnosis not present

## 2020-11-05 DIAGNOSIS — H35341 Macular cyst, hole, or pseudohole, right eye: Secondary | ICD-10-CM | POA: Diagnosis not present

## 2020-11-05 DIAGNOSIS — H5315 Visual distortions of shape and size: Secondary | ICD-10-CM | POA: Diagnosis not present

## 2020-11-05 DIAGNOSIS — H35371 Puckering of macula, right eye: Secondary | ICD-10-CM | POA: Diagnosis not present

## 2020-11-10 DIAGNOSIS — I480 Paroxysmal atrial fibrillation: Secondary | ICD-10-CM

## 2020-11-10 DIAGNOSIS — I2510 Atherosclerotic heart disease of native coronary artery without angina pectoris: Secondary | ICD-10-CM

## 2020-11-10 DIAGNOSIS — I10 Essential (primary) hypertension: Secondary | ICD-10-CM | POA: Diagnosis not present

## 2020-11-10 DIAGNOSIS — E785 Hyperlipidemia, unspecified: Secondary | ICD-10-CM | POA: Diagnosis not present

## 2020-12-01 ENCOUNTER — Other Ambulatory Visit: Payer: Self-pay | Admitting: Internal Medicine

## 2020-12-02 ENCOUNTER — Other Ambulatory Visit: Payer: Self-pay | Admitting: Internal Medicine

## 2020-12-03 DIAGNOSIS — Z23 Encounter for immunization: Secondary | ICD-10-CM | POA: Diagnosis not present

## 2020-12-12 ENCOUNTER — Telehealth (INDEPENDENT_AMBULATORY_CARE_PROVIDER_SITE_OTHER): Payer: Medicare Other | Admitting: Neurology

## 2020-12-12 ENCOUNTER — Other Ambulatory Visit: Payer: Self-pay

## 2020-12-12 ENCOUNTER — Encounter: Payer: Self-pay | Admitting: Neurology

## 2020-12-12 VITALS — Ht 62.0 in | Wt 297.0 lb

## 2020-12-12 DIAGNOSIS — I2510 Atherosclerotic heart disease of native coronary artery without angina pectoris: Secondary | ICD-10-CM

## 2020-12-12 DIAGNOSIS — G7000 Myasthenia gravis without (acute) exacerbation: Secondary | ICD-10-CM

## 2020-12-12 MED ORDER — PYRIDOSTIGMINE BROMIDE 60 MG PO TABS: 60.0000 mg | ORAL_TABLET | Freq: Three times a day (TID) | ORAL | 3 refills | Status: DC

## 2020-12-12 NOTE — Progress Notes (Signed)
   Virtual Visit via Video Note The purpose of this virtual visit is to provide medical care while limiting exposure to the novel coronavirus.    Consent was obtained for video visit:  Yes.   Answered questions that patient had about telehealth interaction:  Yes.   I discussed the limitations, risks, security and privacy concerns of performing an evaluation and management service by telemedicine. I also discussed with the patient that there may be a patient responsible charge related to this service. The patient expressed understanding and agreed to proceed.  Pt location: Home Physician Location: office Name of referring provider:  [name], MD I connected with Kimberly [name] at patients initiation/request on 12/12/2020 at  2:30 PM EST by video enabled telemedicine application and verified that I am speaking with the correct person using two identifiers. Pt MRN:  [id number] Pt DOB:  1945/05/19 Video Participants:  Kimberly [name]   History of Present Illness: This is a 75 y.o. female returning for follow-up of seronegative myasthenia gravis.  She was last seen in 2020 and doing well.  Over the past two years, she has multiple medical conditions including diverticulitis, shingles, and detached retina. She underwent surgery for right detached retina and has noticed the eye to be more droopy. No double vision. She has distorted vision from retinal disease. No new MG symptoms. She has significant joint pain and now walks with a walker.  She continues to be on prednisone 5mg  for leukocytoclastic vasculitis. She retired in 2020   Observations/Objective:   Vitals:   12/12/20 1040  Weight: 297 lb (134.7 kg)  Height: 5\' 2"  (1.575 m)   Patient is awake, alert, and appears comfortable.  Oriented x 4.   Extraocular muscles are intact. Mild-moderate R ptosis.  Face is symmetric.  Speech is not dysarthric.  Antigravity in all extremities.  No pronator drift. Gait appears slow, stable assisted  with walker.   Assessment and Plan:  Seronegative myasthenia gravis (dx 2011 by [facility] and [facility] at [facility]), without exacerbation.  Despite multiple medical conditions, her myasthenia remains stable. Exam shows R ptosis, which was noted previously also.  - Continue prednisone 5mg  which she takes for leukocytoclastic vasculitis   - Continue mestinon 60mg  TID - refills provided  2.  Right cerebellar lesion, less likely MS.  Two prior unsuccessful LPs and work-up at [facility].   Follow Up Instructions:   I discussed the assessment and treatment plan with the patient. The patient was provided an opportunity to ask questions and all were answered. The patient agreed with the plan and demonstrated an understanding of the instructions.   The patient was advised to call back or seek an in-person evaluation if the symptoms worsen or if the condition fails to improve as anticipated.  Follow-up in 1 year  Total time spent reviewing records, interview, history/exam, documentation, counseling, and coordination of care on day of encounter:  20 min    [place], DO

## 2020-12-23 ENCOUNTER — Telehealth: Payer: Self-pay

## 2020-12-23 NOTE — Progress Notes (Signed)
    Chronic Care Management Pharmacy Assistant   Name: Kimberly [name]  MRN: [id number] DOB: [birth year]   Reason for Encounter: Disease State   Conditions to be addressed/monitored: General  Recent office visits:  None ID  Recent consult visits:  12/12/20 Alda Berthold, DO-Neurology (Myasthenia gravis without exacerbation)   Hospital visits:  None since last coordination call  Medications: Outpatient Encounter Medications as of 12/23/2020  Medication Sig   apixaban (ELIQUIS) 5 MG TABS tablet Take 1 tablet (5 mg total) by mouth 2 (two) times daily.   betamethasone dipropionate 0.05 % cream APPLY TOPICALLY TWICE A DAY   CALCIUM PO Take 1,000 mg by mouth at bedtime.   cholecalciferol (VITAMIN D) 1000 units tablet Take 1,000 Units by mouth at bedtime.   diazepam (VALIUM) 5 MG tablet Take 1 tablet (5 mg total) by mouth every 12 (twelve) hours as needed. for anxiety.  In office follow up needed   flecainide (TAMBOCOR) 150 MG tablet Take 1 tablet (150 mg total) by mouth as needed (for AFIB).   furosemide (LASIX) 40 MG tablet TAKE 1/2 TABLET BY MOUTH EVERY DAY   losartan (COZAAR) 100 MG tablet TAKE 1 TABLET BY MOUTH EVERY DAY   Melatonin 10 MG CAPS Take 1 capsule by mouth at bedtime.   Multiple Vitamin (MULTIVITAMIN WITH MINERALS) TABS tablet Take 1 tablet by mouth at bedtime.   ofloxacin (OCUFLOX) 0.3 % ophthalmic solution Place 1 drop into the right eye 4 (four) times daily.   prednisoLONE acetate (PRED FORTE) 1 % ophthalmic suspension Place into the right eye.   predniSONE (DELTASONE) 5 MG tablet TAKE 1 TABLET BY MOUTH EVERY DAY   pyridostigmine (MESTINON) 60 MG tablet Take 1 tablet (60 mg total) by mouth 3 (three) times daily.   pyridOXINE (VITAMIN B-6) 100 MG tablet Take 100 mg by mouth at bedtime.   spironolactone (ALDACTONE) 25 MG tablet TAKE 1 TABLET BY MOUTH EVERY DAY (Patient taking differently: Take 25 mg by mouth daily.)   vitamin B-12 (CYANOCOBALAMIN) 1000 MCG tablet  Take 1,000 mcg by mouth at bedtime.   No facility-administered encounter medications on file as of 12/23/2020.   Have you had any problems recently with your health?Patient states that she recently had shingles and also had surgery for a detached retina and doing fine. She states that she is not having any new health issues  Have you had any problems with your pharmacy?Patient states that she has not had any problems with getting medications or the cost of medications from the pharmacy  What issues or side effects are you having with your medications?Patient states she is not having any side effects to any medications  What would you like me to pass along to [facility] for them to help you with? Patient states she is doing well and does not have any concerns about her health or medications  What can we do to take care of you better? Patient states there is nothing she needs at this time  Care Gaps: Colonoscopy-01/21/17 Diabetic Foot Exam-NA Mammogram-NA Ophthalmology-NA Dexa Scan - NA Annual Well Visit - NA Micro albumin-NA Hemoglobin A1c- NA  Star Rating Drugs: Losartan 100 mg-last fill 12/02/20 30 ds  Ethelene Hal Clinical Pharmacist Assistant [phone]

## 2020-12-26 ENCOUNTER — Telehealth: Payer: Medicare Other | Admitting: Neurology

## 2020-12-28 ENCOUNTER — Other Ambulatory Visit: Payer: Self-pay | Admitting: Neurology

## 2020-12-28 ENCOUNTER — Other Ambulatory Visit: Payer: Self-pay | Admitting: Internal Medicine

## 2020-12-28 DIAGNOSIS — I10 Essential (primary) hypertension: Secondary | ICD-10-CM

## 2020-12-29 ENCOUNTER — Other Ambulatory Visit: Payer: Self-pay | Admitting: Internal Medicine

## 2021-01-09 ENCOUNTER — Telehealth: Payer: Self-pay | Admitting: Internal Medicine

## 2021-01-09 NOTE — Telephone Encounter (Signed)
With limited access across [facility] patient has decided to go to the ED or UC

## 2021-01-09 NOTE — Telephone Encounter (Signed)
Patient's spouse states patient has a fever of 101.7, oxygen level of 92, and fatigue since 12-29  Caller requesting to speak w/ provider, informed caller provider is out of the office  Caller requesting a call back

## 2021-01-12 ENCOUNTER — Other Ambulatory Visit: Payer: Self-pay | Admitting: Internal Medicine

## 2021-02-27 ENCOUNTER — Ambulatory Visit
Admission: RE | Admit: 2021-02-27 | Discharge: 2021-02-27 | Disposition: A | Discharge status: Home / Self Care | Payer: Medicare Other | Source: Ambulatory Visit | Attending: Internal Medicine | Admitting: Internal Medicine

## 2021-02-27 ENCOUNTER — Telehealth: Payer: Self-pay | Admitting: Internal Medicine

## 2021-02-27 ENCOUNTER — Other Ambulatory Visit: Payer: Self-pay

## 2021-02-27 VITALS — BP 137/90 | HR 80 | Temp 98.1°F | Resp 16

## 2021-02-27 DIAGNOSIS — U071 COVID-19: Secondary | ICD-10-CM

## 2021-02-27 MED ORDER — MOLNUPIRAVIR EUA 200MG CAPSULE: 4.0000 | ORAL_CAPSULE | Freq: Two times a day (BID) | ORAL | 0 refills | Status: AC

## 2021-02-27 NOTE — Discharge Instructions (Signed)
°  Please follow up with your Primary Care Provider regarding Paxlovid prescription given concerns with other co-morbidities.

## 2021-02-27 NOTE — ED Provider Notes (Signed)
[facility]    CSN: [id number] Arrival date & time: 02/27/21  1424      History   Chief Complaint Chief Complaint  Patient presents with   covid +    HPI Kimberly [name] is a 76 y.o. female.   Patient here today for evaluation of cough, congestion, sore throat, headache and weakness that started last night.  She reports that this morning she had a positive COVID test.  Husband also has COVID at this time.  She has been taking Tylenol with mild relief.  She has had some nausea and diarrhea but no vomiting.  Patient reports her PCP made her appointment with urgent care today for evaluation.  She is concerned with Paxlovid given multiple chronic comorbidities including A-fib and myasthenia gravis.  The history is provided by the patient.   Past Medical History:  Diagnosis Date   A-fib [facility])    Allergy    Aortic atherosclerosis (HCC)    Atrial tachycardia ([place]) 05/12/2015   Cataract    Colovesical fistula    Diverticulitis    Diverticulosis    DJD (degenerative joint disease), cervical    Frequent UTI    GERD (gastroesophageal reflux disease)    History of hiatal hernia    Small hernia   Hypertension    Internal hemorrhoids 01/21/2017   noted on colonopscopy   Leukocytoclastic vasculitis ([place])    Myalgia and myositis, unspecified    Myasthenia gravis without exacerbation ([place])    [facility], Dr Anna Genre   NSVT (nonsustained ventricular tachycardia) 05/12/2015   Osteoarthritis    Both knees   Osteopenia    Other and unspecified hyperlipidemia    Periumbilical hernia    Pneumonia 04/2016   PONV (postoperative nausea and vomiting)    Psoriasis    plantar ;Dr Jarome Matin   Sleep difficulties    Vasculitis [facility])    Dr Estanislado Pandy   Vitamin D deficiency     Patient Active Problem List   Diagnosis Date Noted   Retinal detachment, right 09/17/2020   GAD (generalized anxiety disorder) 06/30/2020   Nose disorder 04/14/2020   Diarrhea 03/10/2018    Diverticular stricture ([place]) 03/18/2017   Sleep difficulties 10/31/2016   Bilateral leg edema 08/20/2016   Recurrent UTI 08/15/2016   Psoriasis 04/13/2016   History of diverticulitis 04/13/2016   Osteopenia of multiple sites 04/13/2016   DJD (degenerative joint disease), cervical 04/07/2016   Primary osteoarthritis of both knees 04/07/2016   Paroxysmal atrial fibrillation ([place]) 05/12/2015   NSVT (nonsustained ventricular tachycardia) 05/12/2015   Abnormal nuclear stress test - INTERMEDIATE RISK 04/14/2015   Coronary artery calcification seen on CAT scan 02/28/2015   Hyperglycemia 08/09/2014   Dyspnea 40/98/1191   Umbilical hernia 47/82/9562   Pleural effusion 05/10/2012   Myasthenia gravis without exacerbation ([place]) 02/12/2010   Vitamin D deficiency 06/25/2008   Hyperlipidemia 06/25/2008   Essential hypertension 06/25/2008   Leukocytoclastic vasculitis ([place]) 06/19/2007   ANA POSITIVE, HX OF 01/24/2007    Past Surgical History:  Procedure Laterality Date   APPENDECTOMY     & exploratory for inflammation; Dr Hassell Done   AV FISTULA REPAIR     CARDIAC CATHETERIZATION N/A 04/17/2015   Procedure: Left Heart Cath and Coronary Angiography;  Surgeon: Leonie Man, MD;  Location: [place] [facility];  Service: Cardiovascular;  Laterality: N/A;   CERVICAL FUSION     C5-6; Dr Vertell Limber   CHOLECYSTECTOMY     for stones   COLONOSCOPY W/ POLYPECTOMY  01/21/2017   CYSTOSCOPY WITH STENT PLACEMENT Bilateral 03/18/2017   Procedure: CYSTOSCOPY WITH RETROGRADE AND BILATERAL URETERAL STENT PLACEMENT;  Surgeon: Lucas Mallow, MD;  Location: [facility];  Service: Urology;  Laterality: Bilateral;   HERNIA REPAIR     INJECTION OF SILICONE OIL Right 1/0/6269   Procedure: INJECTION OF SILICONE OIL;  Surgeon: Jalene Mullet, MD;  Location: [place];  Service: Ophthalmology;  Laterality: Right;   IR RADIOLOGIST EVAL & MGMT  11/16/2016   IR RADIOLOGIST EVAL & MGMT  12/08/2016   IR RADIOLOGIST EVAL & MGMT   12/16/2016   LAMINECTOMY  2010   T1-2; Dr Juliette Alcide PLANA VITRECTOMY Right 09/16/2020   Procedure: PARS PLANA VITRECTOMY WITH 25 GAUGE With LASER;  Surgeon: Jalene Mullet, MD;  Location: [place];  Service: Ophthalmology;  Laterality: Right;   PARTIAL COLECTOMY N/A 03/18/2017   Procedure: OPEN SIGMOIDECTOMY;  Surgeon: Leighton Ruff, MD;  Location: [facility];  Service: General;  Laterality: N/A;   REPAIR OF COMPLEX TRACTION RETINAL DETACHMENT Right 09/16/2020   Procedure: REPAIR OF COMPLEX TRACTION RETINAL DETACHMENT;  Surgeon: Jalene Mullet, MD;  Location: [place];  Service: Ophthalmology;  Laterality: Right;   SHOULDER SURGERY     Left & Right   THORACENTESIS     for peripneumonic effusion   TOTAL ABDOMINAL HYSTERECTOMY W/ BILATERAL SALPINGOOPHORECTOMY     For Fibroids & endometriosis   TOTAL SHOULDER ARTHROPLASTY     UMBILICAL HERNIA REPAIR N/A 03/18/2017   Procedure: OPEN HERNIA REPAIR UMBILICAL ADULT;  Surgeon: Leighton Ruff, MD;  Location: [facility];  Service: General;  Laterality: N/A;  ERAs pathway    OB History   No obstetric history on file.      Home Medications    Prior to Admission medications   Medication Sig Start Date End Date Taking? Authorizing Provider  apixaban (ELIQUIS) 5 MG TABS tablet Take 1 tablet (5 mg total) by mouth 2 (two) times daily. 10/17/20   Tobb, Kardie, DO  betamethasone dipropionate 0.05 % cream APPLY TOPICALLY TWICE A DAY 10/27/20   Ofilia Neas, PA-C  CALCIUM PO Take 1,000 mg by mouth at bedtime.    [provider]  cholecalciferol (VITAMIN D) 1000 units tablet Take 1,000 Units by mouth at bedtime.    [provider]  diazepam (VALIUM) 5 MG tablet Take 1 tablet (5 mg total) by mouth every 12 (twelve) hours as needed. for anxiety.  In office follow up needed 12/02/20   [name], MD  flecainide (TAMBOCOR) 150 MG tablet Take 1 tablet (150 mg total) by mouth as needed (for AFIB). 09/19/19   Skeet Latch, MD  furosemide (LASIX)  40 MG tablet TAKE 1/2 TABLET BY MOUTH EVERY DAY 12/29/20   [name], MD  losartan (COZAAR) 100 MG tablet TAKE 1 TABLET BY MOUTH EVERY DAY 01/13/21   [name], MD  Melatonin 10 MG CAPS Take 1 capsule by mouth at bedtime.    [provider]  Multiple Vitamin (MULTIVITAMIN WITH MINERALS) TABS tablet Take 1 tablet by mouth at bedtime.    [provider]  ofloxacin (OCUFLOX) 0.3 % ophthalmic solution Place 1 drop into the right eye 4 (four) times daily. 10/29/20   [provider]  prednisoLONE acetate (PRED FORTE) 1 % ophthalmic suspension Place into the right eye. 10/29/20   [provider]  predniSONE (DELTASONE) 5 MG tablet TAKE 1 TABLET BY MOUTH EVERY DAY 10/27/20   Ofilia Neas, PA-C  pyridostigmine (MESTINON) 60 MG tablet Take 1 tablet (60 mg total) by mouth 3 (three) times daily. 12/12/20   Narda Amber K, DO  pyridOXINE (VITAMIN B-6) 100 MG tablet Take 100 mg by mouth at bedtime.    [provider]  spironolactone (ALDACTONE) 25 MG tablet TAKE 1 TABLET BY MOUTH EVERY DAY Patient taking differently: Take 25 mg by mouth daily. 08/18/20   Skeet Latch, MD  vitamin B-12 (CYANOCOBALAMIN) 1000 MCG tablet Take 1,000 mcg by mouth at bedtime.    [provider]    Family History Family History  Problem Relation Age of Onset   Heart attack Mother 51   Heart failure Mother    Stroke Paternal Grandmother        in 44s   Lung cancer Father    Cancer Father        lung   Hyperthyroidism Sister        Graves   Hypertension Sister    Diabetes Neg Hx    Colon cancer Neg Hx    Esophageal cancer Neg Hx    Stomach cancer Neg Hx    Rectal cancer Neg Hx     Social History Social History   Tobacco Use   Smoking status: Never   Smokeless tobacco: Never  Vaping Use   Vaping Use: Never used  Substance Use Topics   Alcohol use: No    Alcohol/week: 0.0 standard drinks   Drug use: No     Allergies   Azithromycin, Ibuprofen,  Levaquin [levofloxacin in d5w], Tetracycline, Isovue [iopamidol], Sulfonamide derivatives, Aleve [naproxen], Tape, Zosyn [piperacillin sod-tazobactam so], Amlodipine besy-benazepril hcl, and Amoxicillin   Review of Systems Review of Systems  Constitutional:  Negative for chills and fever.  HENT:  Positive for congestion and sore throat. Negative for ear pain.   Eyes:  Negative for discharge and redness.  Respiratory:  Positive for cough. Negative for shortness of breath and wheezing.   Gastrointestinal:  Positive for diarrhea and nausea. Negative for abdominal pain and vomiting.  Neurological:  Positive for weakness (generalized) and headaches.    Physical Exam Triage Vital Signs ED Triage Vitals  Enc Vitals Group     BP      Pulse      Resp      Temp      Temp src      SpO2      Weight      Height      Head Circumference      Peak Flow      Pain Score      Pain Loc      Pain Edu?      Excl. in [place]?    No data found.  Updated Vital Signs BP 137/90 (BP Location: Right Arm)    Pulse 80    Temp 98.1 F (36.7 C) (Oral)    Resp 16    SpO2 95%      Physical Exam Vitals and nursing note reviewed.  Constitutional:      General: She is not in acute distress.    Appearance: Normal appearance. She is not ill-appearing.  HENT:     Head: Normocephalic and atraumatic.     Right Ear: Tympanic membrane normal.     Left Ear: Tympanic membrane normal.     Nose: Congestion present.     Mouth/Throat:     Mouth: Mucous membranes are moist.     Pharynx: No oropharyngeal exudate or posterior oropharyngeal erythema.  Eyes:     Conjunctiva/sclera: Conjunctivae normal.  Cardiovascular:     Rate and Rhythm: Normal rate and regular rhythm.     Heart sounds: Normal heart sounds. No murmur heard. Pulmonary:     Effort: Pulmonary effort is normal. No respiratory distress.     Breath sounds: Normal breath sounds. No wheezing, rhonchi or rales.  Skin:    General: Skin is warm and dry.   Neurological:     Mental Status: She is alert.  Psychiatric:        Mood and Affect: Mood normal.        Thought Content: Thought content normal.     UC Treatments / Results  Labs (all labs ordered are listed, but only abnormal results are displayed) Labs Reviewed - No data to display  EKG   Radiology No results found.  Procedures Procedures (including critical care time)  Medications Ordered in UC Medications - No data to display  Initial Impression / Assessment and Plan / UC Course  I have reviewed the triage vital signs and the nursing notes.  Pertinent labs & imaging results that were available during my care of the patient were reviewed by me and considered in my medical decision making (see chart for details).   Recommended anti-viral therapy however given concerns for co-morbidities recommended PCP ultimately make this decision. Patient expresses understanding, will contact PCP after leaving our office. Recommended ED evaluation with any worsening symptoms.    Final Clinical Impressions(s) / UC Diagnoses   Final diagnoses:  [id number]     Discharge Instructions       Please follow up with your Primary Care Provider regarding Paxlovid prescription given concerns with other co-morbidities.      ED Prescriptions   None    PDMP not reviewed this encounter.   Francene Finders, PA-C 02/27/21 1458

## 2021-02-27 NOTE — Telephone Encounter (Signed)
Pt was seen at Urgent Care today and has tested positive for COVID. Requesting Paxlovid, something for the congestion and sore throat.   Last temp 98.1 temporal.    [id number] - [place], [place] - [address] AT [address] [place] Phone:  [phone]  Fax:  [phone]

## 2021-02-27 NOTE — Telephone Encounter (Signed)
Spoke with patient today. 

## 2021-02-27 NOTE — Telephone Encounter (Signed)
Can not take paxlovid.   Can take molnupiravir - sent to hospital

## 2021-02-27 NOTE — ED Triage Notes (Signed)
Onset last night, cough, congestion, sore throat, HA and weakness. Confirms nausea and diarrhea. No emesis.  Has been taking tylenol.  Positive at home covid test. Husband positive as well.

## 2021-03-23 ENCOUNTER — Other Ambulatory Visit: Payer: Self-pay

## 2021-03-23 ENCOUNTER — Ambulatory Visit (INDEPENDENT_AMBULATORY_CARE_PROVIDER_SITE_OTHER): Payer: Medicare Other | Admitting: Cardiovascular Disease

## 2021-03-23 VITALS — BP 122/64 | HR 77 | Ht 62.0 in | Wt 297.0 lb

## 2021-03-23 DIAGNOSIS — I480 Paroxysmal atrial fibrillation: Secondary | ICD-10-CM | POA: Diagnosis not present

## 2021-03-23 DIAGNOSIS — E785 Hyperlipidemia, unspecified: Secondary | ICD-10-CM

## 2021-03-23 MED ORDER — FLECAINIDE ACETATE 150 MG PO TABS: 150.0000 mg | ORAL_TABLET | ORAL | 5 refills | Status: DC | PRN

## 2021-03-23 NOTE — Assessment & Plan Note (Signed)
Nonobstructive CAD.  She is going to have lipids checked with her PCP soon.  She has not tolerated statins in the past.  We discussed the fact that there are no new agents that we may not be able to try if she needs it. ?

## 2021-03-23 NOTE — Assessment & Plan Note (Signed)
Currently maintaining sinus rhythm.  She did have an episode of A-fib last night which is rare for her.  She notes that her flecainide is expired.  We will refill it and she will continue Eliquis. ?

## 2021-03-23 NOTE — Progress Notes (Incomplete)
Cardiology Office Note   Date:  03/23/2021   ID:  Kimberly [name], DOB Sep 12, 1945, MRN [id number]  PCP:  [name], MD  Cardiologist:  Skeet Latch, MD  Electrophysiologist:  None   Evaluation Performed:  Follow-Up Visit  Chief Complaint:  Atrial fibrillation  History of Present Illness:    Kimberly [name] is a 76 y.o. female with paroxysmal atrial fibrillation, hypertension, coronary artery calcification, myasthenia gravis, R LE lymphedema, recurrent R pleural effusion, hyperlipidemia who presents for follow up. She was initially seen 03/2015 for evaluation of asymptomatic coronary calcifications.  She had a chest CT to follow up on pleural effusions and was noted to have coronary calcification. She was asymptomatic but did not exercise much due to myasthenia.  She had a [employer] 03/2015 that was concerning for inferior and apical ischemia.  She subsequently underwent left heart catheterization 04/2015 that showed normal, but tortuous coronary arteries.  She reported palpitations and had a 30 day event monitor that showed paroxysmal atrial tachycardia and NSVT. She was not started on any nodal agents due to her history of myasthenia.  She had an echo 05/2015 that revealed LVEF 60-65% with normal diastolic function.   Ms. Kimberly [name] was admitted 03/2017 for repair of a colovesicular fistula.  On postoperative day 6 she developed atrial fibrillation with rapid ventricular response.  She received 1 dose of IV metoprolol but was switched to amiodarone given her myasthenia.  She converted back to sinus rhythm on amiodarone and was started on Eliquis.  She followed up with Almyra Deforest, PA, 03/2017 and reported dizziness and nausea that was thought to be due to the amiodarone.  Given that her atrial for relation was thought to be postoperative amiodarone was decreased with plans to discontinue it.  She did not have any recurrent atrial fibrillation after that time.  She again wore a 30-day  event monitor that was without atrial fibrillation. She had an episode of afib 03/2018 and was seen in the atrial fibrillation clinic.  She was restarted on Eliquis. She was started on flecainide to be taken as needed.    At her last appointment, she had occasional episodes of atrial fibrillation. She was also recovering from a detached retina. She had COVID-19 02/2021. She is accompanied by her husband. Today, she reports she was doing well until last night when she developed an episode of atrial fibrillation. Her heart rate fluctuated from 54 bpm up to 137 bpm. She did not take her flecainide because it was expired. She requests a refill today. Her blood pressure has reportedly been elevated since her recent COVID-19 infection. This is atypical for her, as her blood pressure usually averages in the 130s at home. For the past year she also complains of nasal dryness, and difficulty breathing unless there is sufficient air circulation in the room. Using a saline spray and ceiling fans seems to help her manage her symptoms. Additionally she complains of worsening anxiety in the past few months. She denies feeling any full panic attacks. She endorses mild right foot edema which she notes is typical and ongoing. Lately she has not been sleeping well due to her pain. Concerning her diet she has been less conscientious of eating healthy foods and monitoring her sodium. In her regimen she is still taking Prednisone 5 mg. She is no longer applying her eye drops for the detached retina. She denies any chest pain, lightheadedness, headaches, syncope, orthopnea, or PND.   Past Medical History:  Diagnosis  Date   A-fib [facility])    Allergy    Aortic atherosclerosis ([place])    Atrial tachycardia ([place]) 05/12/2015   Cataract    Colovesical fistula    Diverticulitis    Diverticulosis    DJD (degenerative joint disease), cervical    Frequent UTI    GERD (gastroesophageal reflux disease)    History of hiatal hernia     Small hernia   Hypertension    Internal hemorrhoids 01/21/2017   noted on colonopscopy   Leukocytoclastic vasculitis (HCC)    Myalgia and myositis, unspecified    Myasthenia gravis without exacerbation ([facility])    [facility], Dr Anna Genre   NSVT (nonsustained ventricular tachycardia) 05/12/2015   Osteoarthritis    Both knees   Osteopenia    Other and unspecified hyperlipidemia    Periumbilical hernia    Pneumonia 04/2016   PONV (postoperative nausea and vomiting)    Psoriasis    plantar ;Dr Jarome Matin   Sleep difficulties    Vasculitis [facility])    Dr Estanislado Pandy   Vitamin D deficiency    Past Surgical History:  Procedure Laterality Date   APPENDECTOMY     & exploratory for inflammation; Dr Hassell Done   AV FISTULA REPAIR     CARDIAC CATHETERIZATION N/A 04/17/2015   Procedure: Left Heart Cath and Coronary Angiography;  Surgeon: Leonie Man, MD;  Location: [place] [facility];  Service: Cardiovascular;  Laterality: N/A;   CERVICAL FUSION     C5-6; Dr Vertell Limber   CHOLECYSTECTOMY     for stones   COLONOSCOPY W/ POLYPECTOMY  01/21/2017   CYSTOSCOPY WITH STENT PLACEMENT Bilateral 03/18/2017   Procedure: CYSTOSCOPY WITH RETROGRADE AND BILATERAL URETERAL STENT PLACEMENT;  Surgeon: Lucas Mallow, MD;  Location: [facility];  Service: Urology;  Laterality: Bilateral;   HERNIA REPAIR     INJECTION OF SILICONE OIL Right 09/13/2669   Procedure: INJECTION OF SILICONE OIL;  Surgeon: Jalene Mullet, MD;  Location: [place];  Service: Ophthalmology;  Laterality: Right;   IR RADIOLOGIST EVAL & MGMT  11/16/2016   IR RADIOLOGIST EVAL & MGMT  12/08/2016   IR RADIOLOGIST EVAL & MGMT  12/16/2016   LAMINECTOMY  2010   T1-2; Dr Juliette Alcide PLANA VITRECTOMY Right 09/16/2020   Procedure: PARS PLANA VITRECTOMY WITH 25 GAUGE With LASER;  Surgeon: Jalene Mullet, MD;  Location: [place];  Service: Ophthalmology;  Laterality: Right;   PARTIAL COLECTOMY N/A 03/18/2017   Procedure: OPEN SIGMOIDECTOMY;  Surgeon: Leighton Ruff,  MD;  Location: [facility];  Service: General;  Laterality: N/A;   REPAIR OF COMPLEX TRACTION RETINAL DETACHMENT Right 09/16/2020   Procedure: REPAIR OF COMPLEX TRACTION RETINAL DETACHMENT;  Surgeon: Jalene Mullet, MD;  Location: [place];  Service: Ophthalmology;  Laterality: Right;   SHOULDER SURGERY     Left & Right   THORACENTESIS     for peripneumonic effusion   TOTAL ABDOMINAL HYSTERECTOMY W/ BILATERAL SALPINGOOPHORECTOMY     For Fibroids & endometriosis   TOTAL SHOULDER ARTHROPLASTY     UMBILICAL HERNIA REPAIR N/A 03/18/2017   Procedure: OPEN HERNIA REPAIR UMBILICAL ADULT;  Surgeon: Leighton Ruff, MD;  Location: [facility];  Service: General;  Laterality: N/A;  ERAs pathway     Current Meds  Medication Sig   apixaban (ELIQUIS) 5 MG TABS tablet Take 1 tablet (5 mg total) by mouth 2 (two) times daily.   betamethasone dipropionate 0.05 % cream APPLY TOPICALLY TWICE A DAY   CALCIUM PO Take 1,000  mg by mouth at bedtime.   cholecalciferol (VITAMIN D) 1000 units tablet Take 1,000 Units by mouth at bedtime.   diazepam (VALIUM) 5 MG tablet Take 1 tablet (5 mg total) by mouth every 12 (twelve) hours as needed. for anxiety.  In office follow up needed   flecainide (TAMBOCOR) 150 MG tablet Take 1 tablet (150 mg total) by mouth as needed (for AFIB).   furosemide (LASIX) 40 MG tablet TAKE 1/2 TABLET BY MOUTH EVERY DAY   losartan (COZAAR) 100 MG tablet TAKE 1 TABLET BY MOUTH EVERY DAY   Melatonin 10 MG CAPS Take 1 capsule by mouth at bedtime.   Multiple Vitamin (MULTIVITAMIN WITH MINERALS) TABS tablet Take 1 tablet by mouth at bedtime.   predniSONE (DELTASONE) 5 MG tablet TAKE 1 TABLET BY MOUTH EVERY DAY   pyridostigmine (MESTINON) 60 MG tablet Take 1 tablet (60 mg total) by mouth 3 (three) times daily.   pyridOXINE (VITAMIN B-6) 100 MG tablet Take 100 mg by mouth at bedtime.   spironolactone (ALDACTONE) 25 MG tablet TAKE 1 TABLET BY MOUTH EVERY DAY (Patient taking differently: Take 25 mg by mouth daily.)    vitamin B-12 (CYANOCOBALAMIN) 1000 MCG tablet Take 1,000 mcg by mouth at bedtime.     Allergies:   Azithromycin, Ibuprofen, Levaquin [levofloxacin in d5w], Tetracycline, Isovue [iopamidol], Sulfonamide derivatives, Aleve [naproxen], Tape, Zosyn [piperacillin sod-tazobactam so], Amlodipine besy-benazepril hcl, and Amoxicillin   Social History   Tobacco Use   Smoking status: Never   Smokeless tobacco: Never  Vaping Use   Vaping Use: Never used  Substance Use Topics   Alcohol use: No    Alcohol/week: 0.0 standard drinks   Drug use: No     Family Hx: The patient's family history includes Cancer in her father; Heart attack (age of onset: 54) in her mother; Heart failure in her mother; Hypertension in her sister; Hyperthyroidism in her sister; Lung cancer in her father; Stroke in her paternal grandmother. There is no history of Diabetes, Colon cancer, Esophageal cancer, Stomach cancer, or Rectal cancer.  ROS:   Please see the history of present illness.    (+) Palpitations (+) Nasal dryness (+) Shortness of breath (+) Anxiety (+) Right foot edema (+) Insomnia All other systems reviewed and are negative.   Prior CV studies:   The following studies were reviewed today:  Monitor 5/19:  30 Day Event Monitor   Quality: Fair.  Baseline artifact. Predominant rhythm: sinus rhythm. Average heart rate: 68 bpm   No arrhythmias noted.  ECHO 3/19:  Impressions:   - Normal LV size with mild LV hypertrophy. EF 55-60%. Normal RV    size and systolic function. No significant valvular    abnormalities.   Echo 05/26/15: Study Conclusions   - Left ventricle: The cavity size was mildly dilated. Systolic   function was normal. The estimated ejection fraction was in the   range of 60% to 65%. Wall motion was normal; there were no   regional wall motion abnormalities. Left ventricular diastolic   function parameters were normal. - Aortic valve: Transvalvular velocity was within the  normal range.   There was no stenosis. There was no regurgitation. - Mitral valve: There was no regurgitation. - Left atrium: The atrium was mildly dilated. - Right ventricle: The cavity size was normal. Wall thickness was   normal. Systolic function was normal. - Tricuspid valve: There was no regurgitation. - Inferior vena cava: The vessel was normal in size. The   respirophasic diameter  changes were in the normal range (= 50%),   consistent with normal central venous pressure.     28 Day Event Monitor 04/01/15:   Quality: Fair.  Baseline artifact. Predominant rhythm: sinus rhythm Average heart rate: 70 bpm Max heart rate: 126 bpm Min heart rate: 50 bpm   Short runs of atrial tachycardia lasting up to 11 seconds.  One 6 beat run of NSVT.    Lexiscan Cardiolite 03/28/15: Nuclear stress EF: 71%. The left ventricular ejection fraction is hyperdynamic (>65%). There was no ST segment deviation noted during stress. This is an intermediate risk study. Findings consistent with ischemia.   Technically difficult study due to increased subdiaphragmatic activity; intermediate risk with small, severe, reversible defects in the apical and inferior basal walls consistent with ischemia; EF 71 with normal wall motion.    LHC 04/17/15: The left ventricular systolic function is normal. Angiographically normal coronary arteries, but very tortuous     Angiographically no evidence of any significant lesions to explain the patient's abnormal stress test. There does appear to be mild calcification in the LAD distribution, but certainly not on the luminal aspect.  Labs/Other Tests and Data Reviewed:    EKG:  03/23/2021: Sinus rhythm. Rate 77 bpm. Nonspecific T wave abnormalities 9/22: sinus rhythm. Rate 68 bpm.  03/26/2020: Sinus rhythm.  Rate 79 bpm.  Nonspecific T wave abnormalities. 03/22/2018: Sinus rhythm. Rate 77 bpm. Non-specific ST-T changes.  Recent Labs: 03/26/2020: ALT 21; Hemoglobin 13.4;  Platelets 330 09/16/2020: BUN 18; Creatinine, Ser 0.90; Potassium 3.8; Sodium 137   Recent Lipid Panel Lab Results  Component Value Date/Time   CHOL 256 (H) 08/09/2014 09:28 AM   TRIG 180 (H) 08/09/2014 09:28 AM   HDL 74 08/09/2014 09:28 AM   LDLCALC 146 (H) 08/09/2014 09:28 AM    Wt Readings from Last 3 Encounters:  03/23/21 297 lb (134.7 kg)  12/12/20 297 lb (134.7 kg)  09/19/20 297 lb (134.7 kg)     Objective:    VS:  BP 122/64 (BP Location: Right Arm, Patient Position: Sitting, Cuff Size: Large)    Pulse 77    Ht '5\' 2"'$  (1.575 m)    Wt 297 lb (134.7 kg)    SpO2 97%    BMI 54.32 kg/m  , BMI Body mass index is 54.32 kg/m. GENERAL:  Well appearing; ***In wheelchair. HEENT: Pupils equal round and reactive, fundi not visualized, oral mucosa unremarkable NECK:  No jugular venous distention, waveform within normal limits, carotid upstroke brisk and symmetric, no bruits, no thyromegaly LUNGS:  Clear to auscultation bilaterally HEART:  RRR.  PMI not displaced or sustained,S1 and S2 within normal limits, no S3, no S4, no clicks, no rubs, no murmurs ABD:  Central adiposity.  positive bowel sounds normal in frequency in pitch, no bruits, no rebound, no guarding, no midline pulsatile mass, no hepatomegaly, no splenomegaly EXT:  2 plus pulses throughout, ***2+ R LE edema.  ***1+ LLE edema, no cyanosis no clubbing SKIN:  No rashes no nodules NEURO:  Cranial nerves II through XII grossly intact, motor grossly intact throughout PSYCH:  Cognitively intact, oriented to person place and time   ASSESSMENT & PLAN:    No problem-specific Assessment & Plan notes found for this encounter.    Medication Adjustments/Labs and Tests Ordered: Current medicines are reviewed at length with the patient today.  Concerns regarding medicines are outlined above.   Tests Ordered: No orders of the defined types were placed in this encounter.  Medication Changes: No  orders of the defined types were placed  in this encounter.  Disposition:  FU with Tiffany C. Oval Linsey, MD, [facility] in 6 months  I,Mathew Stumpf,acting as a Education administrator for Skeet Latch, MD.,have documented all relevant documentation on the behalf of Skeet Latch, MD,as directed by  Skeet Latch, MD while in the presence of Skeet Latch, MD.  I, Hawkins Oval Linsey, MD have reviewed all documentation for this visit.  The documentation of the exam, diagnosis, procedures, and orders on 03/23/2021 are all accurate and complete.  Waynetta Pean  03/23/2021 4:16 PM    [facility]

## 2021-03-23 NOTE — Assessment & Plan Note (Signed)
Blood pressure was initially elevated but better on repeat.  Continue losartan and spironolactone. ?

## 2021-03-23 NOTE — Patient Instructions (Signed)
Medication Instructions:  Your physician recommends that you continue on your current medications as directed. Please refer to the Current Medication list given to you today.   *If you need a refill on your cardiac medications before your next appointment, please call your pharmacy*  Lab Work: NONE  Testing/Procedures: NONE  Follow-Up: At [facility], you and your health needs are our priority.  As part of our continuing mission to provide you with exceptional heart care, we have created designated Provider Care Teams.  These Care Teams include your primary Cardiologist (physician) and Advanced Practice Providers (APPs -  Physician Assistants and Nurse Practitioners) who all work together to provide you with the care you need, when you need it.  We recommend signing up for the patient portal called "MyChart".  Sign up information is provided on this After Visit Summary.  MyChart is used to connect with patients for Virtual Visits (Telemedicine).  Patients are able to view lab/test results, encounter notes, upcoming appointments, etc.  Non-urgent messages can be sent to your provider as well.   To learn more about what you can do with MyChart, go to [email or web address].    Your next appointment:   6 month(s)  The format for your next appointment:   In Person  Provider:   Tiffany [place], MD            

## 2021-03-24 ENCOUNTER — Encounter (HOSPITAL_BASED_OUTPATIENT_CLINIC_OR_DEPARTMENT_OTHER): Payer: Self-pay | Admitting: Cardiovascular Disease

## 2021-03-24 NOTE — Assessment & Plan Note (Signed)
She will get lipids with her PCP soon.  We discussed the fact that there are non-statin alternatives were maybe available if her lipids remain elevated. ?

## 2021-03-31 ENCOUNTER — Other Ambulatory Visit (HOSPITAL_COMMUNITY): Payer: Self-pay | Admitting: Cardiovascular Disease

## 2021-04-01 NOTE — Telephone Encounter (Signed)
Prescription refill request for Eliquis received. ?Indication:Afib ?Last office visit:3/23 ?Scr:0.9 ?Age: 76 ?Weight:134.7 kg ? ?Prescription refilled ? ?

## 2021-04-01 NOTE — Telephone Encounter (Signed)
Eliquis 

## 2021-04-11 ENCOUNTER — Other Ambulatory Visit: Payer: Self-pay | Admitting: Physician Assistant

## 2021-04-13 ENCOUNTER — Ambulatory Visit (INDEPENDENT_AMBULATORY_CARE_PROVIDER_SITE_OTHER): Payer: Medicare Other

## 2021-04-13 DIAGNOSIS — F411 Generalized anxiety disorder: Secondary | ICD-10-CM

## 2021-04-13 DIAGNOSIS — I10 Essential (primary) hypertension: Secondary | ICD-10-CM

## 2021-04-13 DIAGNOSIS — I480 Paroxysmal atrial fibrillation: Secondary | ICD-10-CM

## 2021-04-13 NOTE — Progress Notes (Signed)
? ?Chronic Care Management ?Pharmacy Note ? ?04/13/2021 ?Name:  Kimberly [name] MRN:  [id number] DOB:  04/08/1945 ? ?Summary: ?-Patient reports that she is doing well, notes that she has been compliant with maintenance medications, denies any issues from current medications ?-Reports that her biggest issue is dealing with dry nasal passages and dry throat - unrelieved by occasional use of saline nasal spray ? ?Recommendations/Changes made from today's visit: ?-Consider antihistamine to be used as needed such as claritin, zyrtec, or allegra for short term use  - previously had used flonase nasal spray which exacerbated her symptoms  ?-Continue all other medications, reach out with any medication issues or concerns  ? ? ?Subjective: ?Kimberly [name] is an 76 y.o. year old female who is a primary patient of Burns, Claudina Lick, MD.  The CCM team was consulted for assistance with disease management and care coordination needs.   ? ?Engaged with patient by telephone for follow up visit in response to provider referral for pharmacy case management and/or care coordination services.  ? ?Consent to Services:  ?The patient was given information about Chronic Care Management services, agreed to services, and gave verbal consent prior to initiation of services.  Please see initial visit note for detailed documentation.  ? ?Patient Care Team: ?[name], MD as PCP - General (Internal Medicine) ?Skeet Latch, MD as PCP - Cardiology (Cardiology) ?Alda Berthold, DO as Consulting Physician (Neurology) ?Tomasa Blase, [facility] (Pharmacist) ? ?Recent office visits: ?None since last visit  ? ?Recent consult visits: ?03/23/2021 - Dr. Oval Linsey - Cardiology - no changes to medications - f/u in 6 months  ? ?Hospital visits: ?02/27/2021 - Urgent care - COVID positive - to follow up with PCP about paxlovid rx - per PCP  - pt to start molunipiravir  ? ?Objective: ? ?Lab Results  ?Component Value Date  ? CREATININE 0.90 09/16/2020  ? BUN  18 09/16/2020  ? GFR 58.27 (L) 08/20/2016  ? GFRNONAA >60 09/16/2020  ? GFRAA >60 03/22/2018  ? NA 137 09/16/2020  ? K 3.8 09/16/2020  ? CALCIUM 9.2 09/16/2020  ? CO2 25 09/16/2020  ? GLUCOSE 103 (H) 09/16/2020  ? ? ?Lab Results  ?Component Value Date/Time  ? HGBA1C 5.3 08/09/2014 09:28 AM  ? GFR 58.27 (L) 08/20/2016 11:14 AM  ? GFR 89.82 02/17/2015 04:16 PM  ?  ?Last diabetic Eye exam: No results found for: HMDIABEYEEXA  ?Last diabetic Foot exam: No results found for: HMDIABFOOTEX  ? ?Lab Results  ?Component Value Date  ? CHOL 256 (H) 08/09/2014  ? HDL 74 08/09/2014  ? LDLCALC 146 (H) 08/09/2014  ? TRIG 180 (H) 08/09/2014  ? ? ? ?  Latest Ref Rng & Units 03/26/2020  ?  3:10 PM 10/12/2016  ?  4:49 AM 08/20/2016  ? 11:14 AM  ?Hepatic Function  ?Total Protein 6.0 - 8.5 g/dL 6.9   6.5   7.6    ?Albumin 3.7 - 4.7 g/dL 4.2   3.1   3.9    ?AST 0 - 40 IU/L '28   30   16    ' ?ALT 0 - 32 IU/L '21   26   15    ' ?Alk Phosphatase 44 - 121 IU/L 66   54   64    ?Total Bilirubin 0.0 - 1.2 mg/dL 0.5   0.7   0.4    ? ? ?Lab Results  ?Component Value Date/Time  ? TSH 2.838 03/22/2018 03:30 PM  ? TSH 3.152  03/24/2017 09:22 PM  ? TSH 2.20 08/09/2014 09:28 AM  ? TSH 1.78 01/24/2007 11:23 AM  ? ? ? ?  Latest Ref Rng & Units 03/26/2020  ?  3:10 PM 03/22/2018  ?  3:30 PM 04/08/2017  ? 11:10 AM  ?CBC  ?WBC 3.4 - 10.8 x10E3/uL 12.3   11.5   9.6    ?Hemoglobin 11.1 - 15.9 g/dL 13.4   12.5   9.8    ?Hematocrit 34.0 - 46.6 % 39.3   39.6   30.5    ?Platelets 150 - 450 x10E3/uL 330   299   481    ? ? ?No results found for: VD25OH ? ?Clinical ASCVD: No  ?The ASCVD Risk score (Arnett DK, et al., 2019) failed to calculate for the following reasons: ?  Cannot find a previous HDL lab ?  Cannot find a previous total cholesterol lab   ? ? ?  05/21/2016  ?  1:04 PM 05/09/2015  ?  8:27 AM  ?Depression screen PHQ 2/9  ?Decreased Interest 0 0  ?Down, Depressed, Hopeless 0 0  ?PHQ - 2 Score 0 0  ?   ? ?Social History  ? ?Tobacco Use  ?Smoking Status Never  ?Smokeless  Tobacco Never  ? ?BP Readings from Last 3 Encounters:  ?03/23/21 122/64  ?02/27/21 137/90  ?09/19/20 (!) 126/52  ? ?Pulse Readings from Last 3 Encounters:  ?03/23/21 77  ?02/27/21 80  ?09/19/20 68  ? ?Wt Readings from Last 3 Encounters:  ?03/23/21 297 lb (134.7 kg)  ?12/12/20 297 lb (134.7 kg)  ?09/19/20 297 lb (134.7 kg)  ? ?BMI Readings from Last 3 Encounters:  ?03/23/21 54.32 kg/m?  ?12/12/20 54.32 kg/m?  ?09/19/20 54.32 kg/m?  ? ? ?Assessment/Interventions: Review of patient past medical history, allergies, medications, health status, including review of consultants reports, laboratory and other test data, was performed as part of comprehensive evaluation and provision of chronic care management services.  ? ?SDOH:  (Social Determinants of Health) assessments and interventions performed: Yes ? ?SDOH Screenings  ? ?Alcohol Screen: Not on file  ?Depression (PHQ2-9): Not on file  ?Financial Resource Strain: Not on file  ?Food Insecurity: Not on file  ?Housing: Not on file  ?Physical Activity: Not on file  ?Social Connections: Not on file  ?Stress: Not on file  ?Tobacco Use: Low Risk   ? Smoking Tobacco Use: Never  ? Smokeless Tobacco Use: Never  ? Passive Exposure: Not on file  ?Transportation Needs: Not on file  ? ? ?Anna ? ?Allergies  ?Allergen Reactions  ? Azithromycin Other (See Comments)  ?  Angioedema  ? Ibuprofen   ?  800 mg - edema all over, including lips and tongue  ? Levaquin [Levofloxacin In D5w] Other (See Comments)  ?  D/T MYASTHENIA GRAVIS  ? Tetracycline Rash  ? Isovue [Iopamidol] Dermatitis  ?  Pt had a rash and blisters on bilat feet and lower part of legs.  Full premeds in the future.  This happened twice after IV contrast.   J Bohm  RTRCT  ? Sulfonamide Derivatives   ?  Seizure age 76 (Note: probably fever induced)  ? Aleve [Naproxen] Other (See Comments)  ?  Caused excessive salivation, throat tightness  ? Tape Other (See Comments)  ?  Causes blisters  ?Paper tape only  ? Zosyn  [Piperacillin Sod-Tazobactam So]   ?  Severe leg rash, skin peeling  ? Amlodipine Besy-Benazepril Hcl Cough  ? Amoxicillin Swelling and Rash  ?  Has patient had a PCN reaction causing immediate rash, facial/tongue/throat swelling, SOB or lightheadedness with hypotension: No ?Has patient had a PCN reaction causing severe rash involving mucus membranes or skin necrosis: No ?Has patient had a PCN reaction that required hospitalization: No ?Has patient had a PCN reaction occurring within the last 10 years: Yes ?If all of the above answers are "NO", then may proceed with Cephalosporin use. ?  ? ? ?Medications Reviewed Today   ? ? Reviewed by Tomasa Blase, [facility] (Pharmacist) on 04/13/21 at [place] List Status: <None>  ? ?Medication Order Taking? Sig Documenting Provider Last Dose Status Informant  ?betamethasone dipropionate 0.05 % cream [id number]  APPLY TOPICALLY TWICE A DAY Ofilia Neas, PA-C  Active   ?CALCIUM PO [id number] Yes Take 1,000 mg by mouth at bedtime. [provider] Taking Active Self  ?cholecalciferol (VITAMIN D) 1000 units tablet [id number] Yes Take 1,000 Units by mouth at bedtime. [provider] Taking Active Self  ?diazepam (VALIUM) 5 MG tablet [id number] Yes Take 1 tablet (5 mg total) by mouth every 12 (twelve) hours as needed. for anxiety.  In office follow up needed [name], MD Taking Active   ?ELIQUIS 5 MG TABS tablet [id number] Yes TAKE 1 TABLET BY MOUTH TWICE A DAY Skeet Latch, MD Taking Active   ?flecainide (TAMBOCOR) 150 MG tablet [id number] No Take 1 tablet (150 mg total) by mouth as needed (for AFIB).  ?Patient not taking: Reported on 04/13/2021  ? Skeet Latch, MD Not Taking Active   ?furosemide (LASIX) 40 MG tablet [id number] Yes TAKE 1/2 TABLET BY MOUTH EVERY DAY Burns, Claudina Lick, MD Taking Active   ?losartan (COZAAR) 100 MG tablet [id number] Yes TAKE 1 TABLET BY MOUTH EVERY DAY Burns, Claudina Lick, MD Taking Active   ?Melatonin 10 MG CAPS [id number] Yes Take 1  capsule by mouth at bedtime. [provider] Taking Active Self  ?Multiple Vitamin (MULTIVITAMIN WITH MINERALS) TABS tablet [id number] Yes Take 1 tablet by mouth at bedtime. [provider] Taking

## 2021-04-13 NOTE — Telephone Encounter (Signed)
Next Visit: due July 2023. Message sent to the front to schedule patient  ?  ?Last Visit: 08/07/2020 ?  ?Last Fill: 10/27/2021 ? ?Dx: Psoriasis ?  ?Current Dose per office note on 08/07/2020: Prednisone 5 mg 1 tablet by mouth daily ? ?Okay to refill Prednisone?  ?

## 2021-04-13 NOTE — Patient Instructions (Signed)
Visit Information ? ?Following are the goals we discussed today:  ? ?Manage My Medicine  ? ?Timeframe:  Long-Range Goal ?Priority:  Medium ?Start Date:     10/13/20                        ?Expected End Date:    10/13/21                  ? ?Follow Up Date October 2023 ?  ?- call for medicine refill 2 or 3 days before it runs out ?- call if I am sick and can't take my medicine ?- keep a list of all the medicines I take; vitamins and herbals too ?- use a pillbox to sort medicine  ?-Get covid booster and flu, shingles and tetanus vaccines ?  ?Why is this important?   ?These steps will help you keep on track with your medicines. ? ?Plan: Telephone follow up appointment with care management team member scheduled for:  6 months ?The patient has been provided with contact information for the care management team and has been advised to call with any health related questions or concerns.  ? ?Tomasa Blase, PharmD ?Clinical Pharmacist, [place]  ? ?Please call the care guide team at [phone] if you need to cancel or reschedule your appointment.  ? ?The patient verbalized understanding of instructions, educational materials, and care plan provided today and declined offer to receive copy of patient instructions, educational materials, and care plan.  ? ?

## 2021-04-13 NOTE — Telephone Encounter (Signed)
Please schedule patient for a follow up visit. Patient due July 2023. Thanks!  ?

## 2021-05-10 DIAGNOSIS — I10 Essential (primary) hypertension: Secondary | ICD-10-CM

## 2021-05-10 DIAGNOSIS — I480 Paroxysmal atrial fibrillation: Secondary | ICD-10-CM

## 2021-05-31 ENCOUNTER — Encounter: Payer: Self-pay | Admitting: Internal Medicine

## 2021-05-31 NOTE — Patient Instructions (Addendum)
     Blood work was ordered.     Medications changes include :   sonata 5 mg at bedtime.   Your prescription(s) have been sent to your pharmacy.    A abdominal US was ordered.     Someone will call you to schedule an appointment.    Return in about 1 year (around 06/02/2022) for follow up.

## 2021-05-31 NOTE — Progress Notes (Signed)
Subjective:    Patient ID: Kimberly [name], female    DOB: 1945-12-03, 76 y.o.   MRN: [id number]     HPI Sarahelizabeth is here for follow up of her chronic medical problems, including htn, afib, myasthenia gravis, hld, hyperglycemia, vit d def   Had colon surgery and repair of hernia at that time.  Her lower abdomen has been getting larger over the years and has become tender especially when she pushes up against something.  She thinks the hernia has recurred and wanted further evaluation to see if that was true or not. While Issues with sleep and anxiety.  Takes melatonin but not helping much.  The knees keep her awake and then she gets anxious.  Did try diazepam for slepp and it did help.   Takes diazepam as needed- rarely takes it during the day.    Medications and allergies reviewed with patient and updated if appropriate.  Current Outpatient Medications on File Prior to Visit  Medication Sig Dispense Refill   betamethasone dipropionate 0.05 % cream APPLY TOPICALLY TWICE A DAY 45 g 0   CALCIUM PO Take 1,000 mg by mouth at bedtime.     cholecalciferol (VITAMIN D) 1000 units tablet Take 1,000 Units by mouth at bedtime.     ELIQUIS 5 MG TABS tablet TAKE 1 TABLET BY MOUTH TWICE A DAY 180 tablet 1   flecainide (TAMBOCOR) 150 MG tablet Take 1 tablet (150 mg total) by mouth as needed (for AFIB). 5 tablet 5   furosemide (LASIX) 40 MG tablet TAKE 1/2 TABLET BY MOUTH EVERY DAY 45 tablet 0   losartan (COZAAR) 100 MG tablet TAKE 1 TABLET BY MOUTH EVERY DAY 90 tablet 1   Melatonin 10 MG CAPS Take 1 capsule by mouth at bedtime.     Multiple Vitamin (MULTIVITAMIN WITH MINERALS) TABS tablet Take 1 tablet by mouth at bedtime.     predniSONE (DELTASONE) 5 MG tablet TAKE 1 TABLET BY MOUTH EVERY DAY 90 tablet 0   pyridostigmine (MESTINON) 60 MG tablet Take 1 tablet (60 mg total) by mouth 3 (three) times daily. 270 tablet 3   pyridOXINE (VITAMIN B-6) 100 MG tablet Take 100 mg by mouth at bedtime.      spironolactone (ALDACTONE) 25 MG tablet TAKE 1 TABLET BY MOUTH EVERY DAY 90 tablet 2   vitamin B-12 (CYANOCOBALAMIN) 1000 MCG tablet Take 1,000 mcg by mouth at bedtime.     No current facility-administered medications on file prior to visit.     Review of Systems  Constitutional:  Positive for fever (occ).  Respiratory:  Positive for shortness of breath (with exertion). Negative for cough and wheezing.   Cardiovascular:  Positive for leg swelling. Negative for chest pain and palpitations.  Gastrointestinal:  Positive for abdominal pain.  Neurological:  Negative for light-headedness and headaches.  Psychiatric/Behavioral:  Positive for sleep disturbance. The patient is nervous/anxious.       Objective:   Vitals:   06/01/21 1516  BP: 124/70  Pulse: 75  Temp: 98.6 F (37 C)  SpO2: 98%   BP Readings from Last 3 Encounters:  06/01/21 124/70  03/23/21 122/64  02/27/21 137/90   Wt Readings from Last 3 Encounters:  03/23/21 297 lb (134.7 kg)  12/12/20 297 lb (134.7 kg)  09/19/20 297 lb (134.7 kg)   Body mass index is 54.32 kg/m.    Physical Exam Constitutional:      General: She is not in acute distress.  Appearance: Normal appearance.  HENT:     Head: Normocephalic and atraumatic.  Eyes:     Conjunctiva/sclera: Conjunctivae normal.  Cardiovascular:     Rate and Rhythm: Normal rate and regular rhythm.     Heart sounds: Normal heart sounds. No murmur heard. Pulmonary:     Effort: Pulmonary effort is normal. No respiratory distress.     Breath sounds: Normal breath sounds. No wheezing.  Musculoskeletal:     Cervical back: Neck supple.     Right lower leg: Edema (trace) present.     Left lower leg: Edema (trace) present.  Lymphadenopathy:     Cervical: No cervical adenopathy.  Skin:    General: Skin is warm and dry.     Findings: No rash.  Neurological:     Mental Status: She is alert. Mental status is at baseline.  Psychiatric:        Mood and Affect: Mood  normal.        Behavior: Behavior normal.       Lab Results  Component Value Date   WBC 12.3 (H) 03/26/2020   HGB 13.4 03/26/2020   HCT 39.3 03/26/2020   PLT 330 03/26/2020   GLUCOSE 103 (H) 09/16/2020   CHOL 256 (H) 08/09/2014   TRIG 180 (H) 08/09/2014   HDL 74 08/09/2014   LDLCALC 146 (H) 08/09/2014   ALT 21 03/26/2020   AST 28 03/26/2020   NA 137 09/16/2020   K 3.8 09/16/2020   CL 103 09/16/2020   CREATININE 0.90 09/16/2020   BUN 18 09/16/2020   CO2 25 09/16/2020   TSH 2.838 03/22/2018   INR 1.11 11/18/2016   HGBA1C 5.3 08/09/2014     Assessment & Plan:    See Problem List for Assessment and Plan of chronic medical problems.

## 2021-06-01 ENCOUNTER — Ambulatory Visit (INDEPENDENT_AMBULATORY_CARE_PROVIDER_SITE_OTHER): Payer: Medicare Other | Admitting: Internal Medicine

## 2021-06-01 VITALS — BP 124/70 | HR 75 | Temp 98.6°F | Ht 62.0 in

## 2021-06-01 DIAGNOSIS — E559 Vitamin D deficiency, unspecified: Secondary | ICD-10-CM

## 2021-06-01 DIAGNOSIS — F411 Generalized anxiety disorder: Secondary | ICD-10-CM

## 2021-06-01 DIAGNOSIS — R739 Hyperglycemia, unspecified: Secondary | ICD-10-CM | POA: Diagnosis not present

## 2021-06-01 DIAGNOSIS — I10 Essential (primary) hypertension: Secondary | ICD-10-CM

## 2021-06-01 DIAGNOSIS — G7000 Myasthenia gravis without (acute) exacerbation: Secondary | ICD-10-CM

## 2021-06-01 DIAGNOSIS — R1030 Lower abdominal pain, unspecified: Secondary | ICD-10-CM | POA: Diagnosis not present

## 2021-06-01 DIAGNOSIS — M2550 Pain in unspecified joint: Secondary | ICD-10-CM

## 2021-06-01 DIAGNOSIS — G479 Sleep disorder, unspecified: Secondary | ICD-10-CM

## 2021-06-01 DIAGNOSIS — I480 Paroxysmal atrial fibrillation: Secondary | ICD-10-CM | POA: Diagnosis not present

## 2021-06-01 DIAGNOSIS — E782 Mixed hyperlipidemia: Secondary | ICD-10-CM | POA: Diagnosis not present

## 2021-06-01 MED ORDER — DIAZEPAM 5 MG PO TABS: 5.0000 mg | ORAL_TABLET | Freq: Two times a day (BID) | ORAL | 0 refills | Status: DC | PRN

## 2021-06-01 MED ORDER — ZALEPLON 5 MG PO CAPS: 5.0000 mg | ORAL_CAPSULE | Freq: Every evening | ORAL | 5 refills | Status: DC | PRN

## 2021-06-01 NOTE — Assessment & Plan Note (Signed)
Chronic Paroxysmal On Eliquis 5 mg twice daily Takes flecainide 50 mg as needed CBC, CMP, TSH

## 2021-06-01 NOTE — Assessment & Plan Note (Signed)
Chronic Check a1c Low sugar / carb diet Stressed regular exercise  

## 2021-06-01 NOTE — Assessment & Plan Note (Signed)
Chronic Continue diazepam 5 mg every 12 hours as needed

## 2021-06-01 NOTE — Assessment & Plan Note (Signed)
Chronic Taking vitamin D daily Check vitamin D level  

## 2021-06-01 NOTE — Assessment & Plan Note (Signed)
Chronic Blood pressure well controlled CMP Continue losartan 100 mg daily, spironolactone 25 mg daily

## 2021-06-01 NOTE — Assessment & Plan Note (Signed)
Chronic Management per neurology-Dr. Posey Pronto On prednisone 5 mg daily and Mestinon 60 mg 3 times daily

## 2021-06-01 NOTE — Assessment & Plan Note (Signed)
Chronic Regular exercise and healthy diet encouraged Check lipid panel  Consider Zetia 10 mg daily

## 2021-06-01 NOTE — Assessment & Plan Note (Signed)
Chronic Has difficulty with sleeping Sleep is interrupted and she has difficulty falling asleep and getting back to sleep when she wakes up Feels like she needs something to help-melatonin is no longer working Trial of Sonata 5 mg nightly as needed

## 2021-06-01 NOTE — Assessment & Plan Note (Signed)
Chronic Has lower abdominal pain that has been getting worse-tender when she pushes up against her abdomen Concern for recurrent hernia Will obtain ultrasound of abdomen-does not want to do CT because she does not think she be able to lay there for it

## 2021-06-01 NOTE — Assessment & Plan Note (Signed)
Chronic Has chronic joint pain Has history of positive ANA and she wonders if positive ANA might be related to lupus or some other autoimmune disease We will recheck ANA.  Discussed that this can often be a false positive-she had a positive test recently, but no titer was done Discussed that we would need the titer to know for sure She does follow with rheumatology and can discuss further with them-the positive ANA may also be secondary to her vasculitis

## 2021-06-02 LAB — COMPREHENSIVE METABOLIC PANEL
ALT: 22 U/L (ref 0–35)
AST: 29 U/L (ref 0–37)
Albumin: 4.3 g/dL (ref 3.5–5.2)
Alkaline Phosphatase: 55 U/L (ref 39–117)
BUN: 22 mg/dL (ref 6–23)
CO2: 26 mEq/L (ref 19–32)
Calcium: 9.9 mg/dL (ref 8.4–10.5)
Chloride: 100 mEq/L (ref 96–112)
Creatinine, Ser: 0.87 mg/dL (ref 0.40–1.20)
GFR: 65.51 mL/min (ref >60.00)
Glucose, Bld: 98 mg/dL (ref 70–99)
Potassium: 4.2 mEq/L (ref 3.5–5.1)
Sodium: 137 mEq/L (ref 135–145)
Total Bilirubin: 0.6 mg/dL (ref 0.2–1.2)
Total Protein: 7.5 g/dL (ref 6.0–8.3)

## 2021-06-02 LAB — CBC WITH DIFFERENTIAL/PLATELET
Basophils Absolute: 0 10*3/uL (ref 0.0–0.1)
Basophils Relative: 0.3 % (ref 0.0–3.0)
Eosinophils Absolute: 0 10*3/uL (ref 0.0–0.7)
Eosinophils Relative: 0.1 % (ref 0.0–5.0)
HCT: 40.9 % (ref 36.0–46.0)
Hemoglobin: 13.5 g/dL (ref 12.0–15.0)
Lymphocytes Relative: 7.6 % — ABNORMAL LOW (ref 12.0–46.0)
Lymphs Abs: 0.9 10*3/uL (ref 0.7–4.0)
MCHC: 33.1 g/dL (ref 30.0–36.0)
MCV: 90 fl (ref 78.0–100.0)
Monocytes Absolute: 0.8 10*3/uL (ref 0.1–1.0)
Monocytes Relative: 6.4 % (ref 3.0–12.0)
Neutro Abs: 10.4 10*3/uL — ABNORMAL HIGH (ref 1.4–7.7)
Neutrophils Relative %: 85.6 % — ABNORMAL HIGH (ref 43.0–77.0)
Platelets: 299 10*3/uL (ref 150.0–400.0)
RBC: 4.55 Mil/uL (ref 3.87–5.11)
RDW: 14.6 % (ref 11.5–15.5)
WBC: 12.2 10*3/uL — ABNORMAL HIGH (ref 4.0–10.5)

## 2021-06-02 LAB — TSH: TSH: 2.1 u[IU]/mL (ref 0.35–5.50)

## 2021-06-02 LAB — LIPID PANEL
Cholesterol: 200 mg/dL (ref 0–200)
HDL: 64.3 mg/dL (ref >39.00)
LDL Cholesterol: 117 mg/dL — ABNORMAL HIGH (ref 0–99)
NonHDL: 135.9
Total CHOL/HDL Ratio: 3
Triglycerides: 93 mg/dL (ref 0.0–149.0)
VLDL: 18.6 mg/dL (ref 0.0–40.0)

## 2021-06-02 LAB — HEMOGLOBIN A1C: Hgb A1c MFr Bld: 5.4 % (ref 4.6–6.5)

## 2021-06-02 LAB — VITAMIN D 25 HYDROXY (VIT D DEFICIENCY, FRACTURES): VITD: 52.66 ng/mL (ref 30.00–100.00)

## 2021-06-02 LAB — ANA: Anti Nuclear Antibody (ANA): NEGATIVE

## 2021-06-15 ENCOUNTER — Ambulatory Visit (HOSPITAL_BASED_OUTPATIENT_CLINIC_OR_DEPARTMENT_OTHER)
Admission: RE | Admit: 2021-06-15 | Discharge: 2021-06-15 | Disposition: A | Discharge status: Home / Self Care | Payer: Medicare Other | Source: Ambulatory Visit | Attending: Internal Medicine | Admitting: Internal Medicine

## 2021-06-15 DIAGNOSIS — R1030 Lower abdominal pain, unspecified: Secondary | ICD-10-CM | POA: Diagnosis not present

## 2021-06-15 DIAGNOSIS — R109 Unspecified abdominal pain: Secondary | ICD-10-CM | POA: Diagnosis not present

## 2021-06-18 ENCOUNTER — Telehealth: Payer: Self-pay | Admitting: Internal Medicine

## 2021-06-18 NOTE — Telephone Encounter (Signed)
Patient would like someone for someone to call to discuss her latest lab results

## 2021-06-21 ENCOUNTER — Other Ambulatory Visit: Payer: Self-pay | Admitting: Internal Medicine

## 2021-06-21 DIAGNOSIS — R109 Unspecified abdominal pain: Secondary | ICD-10-CM

## 2021-06-22 NOTE — Progress Notes (Signed)
Patient would like to be sent to [facility] - please make sure order & appointment is made there.

## 2021-06-23 NOTE — Progress Notes (Signed)
Location changed 

## 2021-06-23 NOTE — Addendum Note (Signed)
Addended by: [name] on: 06/23/2021 02:36 PM   Modules accepted: Orders

## 2021-06-26 ENCOUNTER — Other Ambulatory Visit: Payer: Self-pay | Admitting: Internal Medicine

## 2021-06-26 DIAGNOSIS — I10 Essential (primary) hypertension: Secondary | ICD-10-CM

## 2021-07-01 ENCOUNTER — Other Ambulatory Visit (HOSPITAL_BASED_OUTPATIENT_CLINIC_OR_DEPARTMENT_OTHER): Payer: Medicare Other

## 2021-07-09 ENCOUNTER — Ambulatory Visit (HOSPITAL_BASED_OUTPATIENT_CLINIC_OR_DEPARTMENT_OTHER)
Admission: RE | Admit: 2021-07-09 | Discharge: 2021-07-09 | Disposition: A | Discharge status: Home / Self Care | Payer: Medicare Other | Source: Ambulatory Visit | Attending: Internal Medicine | Admitting: Internal Medicine

## 2021-07-09 DIAGNOSIS — G319 Degenerative disease of nervous system, unspecified: Secondary | ICD-10-CM | POA: Diagnosis not present

## 2021-07-09 DIAGNOSIS — K5730 Diverticulosis of large intestine without perforation or abscess without bleeding: Secondary | ICD-10-CM | POA: Diagnosis not present

## 2021-07-09 DIAGNOSIS — K7689 Other specified diseases of liver: Secondary | ICD-10-CM | POA: Diagnosis not present

## 2021-07-09 DIAGNOSIS — R109 Unspecified abdominal pain: Secondary | ICD-10-CM | POA: Diagnosis not present

## 2021-07-09 DIAGNOSIS — N2889 Other specified disorders of kidney and ureter: Secondary | ICD-10-CM | POA: Diagnosis not present

## 2021-07-12 ENCOUNTER — Other Ambulatory Visit: Payer: Self-pay | Admitting: Cardiovascular Disease

## 2021-07-12 ENCOUNTER — Other Ambulatory Visit: Payer: Self-pay | Admitting: Internal Medicine

## 2021-07-13 ENCOUNTER — Other Ambulatory Visit: Payer: Self-pay | Admitting: Physician Assistant

## 2021-07-13 NOTE — Telephone Encounter (Signed)
Next Visit: 08/06/2021  Last Visit: 08/07/2020   Last Fill: 04/13/2021  Dx: Leukocytoclastic vasculitis   Current Dose per office note on 08/07/2020:  Prednisone 5 mg 1 tablet by mouth daily  Okay to refill prednisone?

## 2021-07-23 NOTE — Progress Notes (Signed)
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  She has been taking prednisone 5 mg p.o. daily without any side effects.  She has not had any recurrence of vasculitis.  She tried tapering prednisone in the past and had recurrence of the rash.  She states she is in a lot of discomfort due to arthritis.  She continues to have pain and discomfort in her bilateral shoulders and her knee joints.  She has an appointment coming up with the surgeon for umbilical hernia.  She ambulates with the help of a walker at home.  She states that ambulation is difficult for her.  Progressively she is having problems supinating her right forearm.  She has difficulty feeding herself.  Activities of Daily Living:  Patient reports morning stiffness for 0 minutes.   Patient Reports nocturnal pain.  Difficulty dressing/grooming: Reports Difficulty climbing stairs: Reports Difficulty getting out of chair: Reports Difficulty using hands for taps, buttons, cutlery, and/or writing: Reports  Review of Systems  Constitutional:  Positive for fatigue.  HENT:  Negative for mouth sores and mouth dryness.   Eyes:  Negative for dryness.  Respiratory:  Positive for shortness of breath.        With exertion   Cardiovascular:  Positive for palpitations. Negative for chest pain.  Gastrointestinal:  Positive for diarrhea. Negative for blood in stool and constipation.  Endocrine: Negative for increased urination.  Genitourinary:  Negative for involuntary urination.  Musculoskeletal:  Positive for joint pain, joint pain and joint swelling. Negative for  myalgias, muscle weakness, morning stiffness, muscle tenderness and myalgias.  Skin:  Negative for color change, rash, hair loss and sensitivity to sunlight.  Allergic/Immunologic: Positive for susceptible to infections.  Neurological:  Positive for weakness. Negative for dizziness and headaches.  Hematological:  Negative for swollen glands.  Psychiatric/Behavioral:  Positive for depressed mood and sleep disturbance. The patient is nervous/anxious.     PMFS History:  Patient Active Problem List   Diagnosis Date Noted   Arthralgia 06/01/2021   Lower abdominal pain 06/01/2021   Retinal detachment, right 09/17/2020   GAD (generalized anxiety disorder) 06/30/2020   Nose disorder 04/14/2020   Diverticular stricture ([place]) 03/18/2017   Sleep difficulties 10/31/2016   Bilateral leg edema 08/20/2016   Recurrent UTI 08/15/2016   Psoriasis 04/13/2016   History of diverticulitis 04/13/2016   Osteopenia of multiple sites 04/13/2016   DJD (degenerative joint disease), cervical 04/07/2016   Primary osteoarthritis of both knees 04/07/2016   Paroxysmal atrial fibrillation ([place]) 05/12/2015   NSVT (nonsustained ventricular tachycardia) ([place]) 05/12/2015   Abnormal nuclear stress test - INTERMEDIATE RISK 04/14/2015   Coronary artery calcification seen on CAT scan 02/28/2015   Hyperglycemia 08/09/2014   Dyspnea 09/32/3557   Umbilical hernia 32/20/2542   Pleural effusion 05/10/2012   Myasthenia gravis without exacerbation ([place]) 02/12/2010   Vitamin D deficiency 06/25/2008   Hyperlipidemia 06/25/2008   Essential hypertension 06/25/2008   Leukocytoclastic vasculitis ([place]) 06/19/2007   ANA POSITIVE, HX OF 01/24/2007  Past Medical History:  Diagnosis Date   A-fib [facility])    Allergy    Aortic atherosclerosis (HCC)    Atrial tachycardia ([place]) 05/12/2015   Cataract    Colovesical fistula    Diverticulitis    Diverticulosis    DJD (degenerative joint disease), cervical    Frequent UTI    GERD  (gastroesophageal reflux disease)    History of hiatal hernia    Small hernia   Hypertension    Internal hemorrhoids 01/21/2017   noted on colonopscopy   Leukocytoclastic vasculitis (HCC)    Myalgia and myositis, unspecified    Myasthenia gravis without exacerbation (HCC)    [facility], Dr Anna Genre   NSVT (nonsustained ventricular tachycardia) ([place]) 05/12/2015   Osteoarthritis    Both knees   Osteopenia    Other and unspecified hyperlipidemia    Periumbilical hernia    Pneumonia 04/2016   PONV (postoperative nausea and vomiting)    Psoriasis    plantar ;Dr Jarome Matin   Sleep difficulties    Vasculitis ([place])    Dr Estanislado Pandy   Vitamin D deficiency     Family History  Problem Relation Age of Onset   Heart attack Mother 20   Heart failure Mother    Stroke Paternal Grandmother        in 93s   Lung cancer Father    Cancer Father        lung   Hyperthyroidism Sister        Graves   Hypertension Sister    Diabetes Neg Hx    Colon cancer Neg Hx    Esophageal cancer Neg Hx    Stomach cancer Neg Hx    Rectal cancer Neg Hx    Past Surgical History:  Procedure Laterality Date   APPENDECTOMY     & exploratory for inflammation; Dr Hassell Done   AV FISTULA REPAIR     CARDIAC CATHETERIZATION N/A 04/17/2015   Procedure: Left Heart Cath and Coronary Angiography;  Surgeon: Leonie Man, MD;  Location: [facility];  Service: Cardiovascular;  Laterality: N/A;   CERVICAL FUSION     C5-6; Dr Vertell Limber   CHOLECYSTECTOMY     for stones   COLONOSCOPY W/ POLYPECTOMY  01/21/2017   CYSTOSCOPY WITH STENT PLACEMENT Bilateral 03/18/2017   Procedure: CYSTOSCOPY WITH RETROGRADE AND BILATERAL URETERAL STENT PLACEMENT;  Surgeon: Lucas Mallow, MD;  Location: [facility];  Service: Urology;  Laterality: Bilateral;   HERNIA REPAIR     INJECTION OF SILICONE OIL Right 08/16/7617   Procedure: INJECTION OF SILICONE OIL;  Surgeon: Jalene Mullet, MD;  Location: [facility];  Service: Ophthalmology;  Laterality:  Right;   IR RADIOLOGIST EVAL & MGMT  11/16/2016   IR RADIOLOGIST EVAL & MGMT  12/08/2016   IR RADIOLOGIST EVAL & MGMT  12/16/2016   LAMINECTOMY  2010   T1-2; Dr Juliette Alcide PLANA VITRECTOMY Right 09/16/2020   Procedure: PARS PLANA VITRECTOMY WITH 25 GAUGE With LASER;  Surgeon: Jalene Mullet, MD;  Location: [facility];  Service: Ophthalmology;  Laterality: Right;   PARTIAL COLECTOMY N/A 03/18/2017   Procedure: OPEN SIGMOIDECTOMY;  Surgeon: Leighton Ruff, MD;  Location: [facility];  Service: General;  Laterality: N/A;   REPAIR OF COMPLEX TRACTION RETINAL DETACHMENT Right 09/16/2020   Procedure: REPAIR OF COMPLEX TRACTION RETINAL DETACHMENT;  Surgeon: Jalene Mullet, MD;  Location: [facility];  Service: Ophthalmology;  Laterality: Right;   SHOULDER SURGERY     Left & Right  THORACENTESIS     for peripneumonic effusion   TOTAL ABDOMINAL HYSTERECTOMY W/ BILATERAL SALPINGOOPHORECTOMY     For Fibroids & endometriosis   TOTAL SHOULDER ARTHROPLASTY     UMBILICAL HERNIA REPAIR N/A 03/18/2017   Procedure: OPEN HERNIA REPAIR UMBILICAL ADULT;  Surgeon: Leighton Ruff, MD;  Location: [facility];  Service: General;  Laterality: N/A;  ERAs pathway   Social History   Social History Narrative   Lives with husband in a one story home.  Had one child that only lived for 14 hours.     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          Epworth Sleepiness Scale = 2 (as of 03/14/2015)   Immunization History  Administered Date(s) Administered   Influenza, High Dose Seasonal PF 09/11/2012, 11/01/2016, 11/24/2017   Influenza,inj,Quad PF,6+ Mos 10/12/2014   Influenza-Unspecified 10/11/2013, 10/24/2015   PFIZER(Purple Top)SARS-COV-2 Vaccination 07/31/2020   Pneumococcal Conjugate-13 05/09/2015   Pneumococcal Polysaccharide-23 04/19/2013     Objective: Vital Signs: BP 139/78 (BP Location: Right Wrist, Patient Position: Sitting, Cuff Size: Normal)   Pulse 80   Ht '5\' 2"'$  (1.575 m)   Wt  297 lb (134.7 kg) Comment: per patient  BMI 54.32 kg/m    Physical Exam Vitals and nursing note reviewed.  Constitutional:      Appearance: She is well-developed.  HENT:     Head: Normocephalic and atraumatic.  Eyes:     Conjunctiva/sclera: Conjunctivae normal.  Cardiovascular:     Rate and Rhythm: Normal rate and regular rhythm.     Heart sounds: Normal heart sounds.  Pulmonary:     Effort: Pulmonary effort is normal.     Breath sounds: Normal breath sounds.  Abdominal:     General: Bowel sounds are normal.     Palpations: Abdomen is soft.  Musculoskeletal:     Cervical back: Normal range of motion.  Lymphadenopathy:     Cervical: No cervical adenopathy.  Skin:    General: Skin is warm and dry.     Capillary Refill: Capillary refill takes less than 2 seconds.  Neurological:     Mental Status: She is alert and oriented to person, place, and time.  Psychiatric:        Behavior: Behavior normal.      Musculoskeletal Exam: Accompanied by her husband and positive wheelchair.  Cervical spine with good range of motion without discomfort.  Right shoulder joint abduction was limited to about 30 degrees and left about 50 degrees.  She had very limited internal rotation.  She had limited pronation of her right forearm.  She had PIP and DIP thickening with the right fourth PIP contracture.  Hip joint range of motion could not be assessed in the wheelchair.  She had almost full extension of her knee joints with crepitus in her bilateral knee joints.  She had no tenderness over ankles.  Lower extremity pitting edema was noted.  CDAI Exam: CDAI Score: -- Patient Global: --; Provider Global: -- Swollen: --; Tender: -- Joint Exam 08/06/2021   No joint exam has been documented for this visit   There is currently no information documented on the homunculus. Go to the Rheumatology activity and complete the homunculus joint exam.  Investigation: No additional findings.  Imaging: CT  Abdomen Pelvis Wo Contrast  Result Date: 07/10/2021 CLINICAL DATA:  Abdominal pain. Complicated hernia. Recent ultrasound with large ventral hernia versus diastasis. EXAM: CT ABDOMEN AND PELVIS WITHOUT CONTRAST TECHNIQUE:  Multidetector CT imaging of the abdomen and pelvis was performed following the standard protocol without IV contrast. RADIATION DOSE REDUCTION: This exam was performed according to the departmental dose-optimization program which includes automated exposure control, adjustment of the mA and/or kV according to patient size and/or use of iterative reconstruction technique. COMPARISON:  Ultrasound 06/15/2021.  Abdominopelvic CT 11/16/2016 FINDINGS: Lower chest: Minor subsegmental atelectasis in the lung bases. No pleural fluid. Hepatobiliary: Borderline hepatic steatosis. Liver is mildly prominent size spanning 17.4 cm cranial caudal. No focal hepatic lesion on this unenhanced exam. Clips in the gallbladder fossa postcholecystectomy. No biliary dilatation. Pancreas: Mild parenchymal atrophy. No ductal dilatation or inflammation. Spleen: Normal in size without focal abnormality. Adrenals/Urinary Tract: Normal adrenal glands. Mild bilateral renal parenchymal thinning. No hydronephrosis. No renal calculi. No contour deforming renal mass, detailed parenchymal assessment limited due to lack of IV contrast. Urinary bladder is near completely empty and not well assessed. No air in the urinary bladder. Stomach/Bowel: Rectus diastasis with associated ventral abdominal wall hernia. Nonobstructed noninflamed small bowel and colon present in the hernia sac. Transverse diastasis of 20 cm, craniocaudal extent of 13.7 cm. Small hiatal hernia. The stomach is decompressed. Distal stomach extends into the region of ventral diastasis. No small bowel obstruction, inflammation, or wall thickening. Terminal ileum and cecum present in the lower aspect of the ventral abdominal wall hernia. Prior appendectomy. Enteric  contrast is seen throughout the colon. Scattered colonic diverticulosis without diverticulitis. No colonic wall thickening. Vascular/Lymphatic: Moderate aortic atherosclerosis. No aortic aneurysm. Mild to moderate branch atherosclerosis. There is no bulky abdominopelvic adenopathy. Reproductive: Status post hysterectomy. No adnexal masses. Other: Rectus diastasis with broad-based lower ventral abdominal wall hernia containing nonobstructed nor inflamed small and large bowel. No ascites. No abdominopelvic collection. Musculoskeletal: Diffuse lumbar spondylosis. L4 bone island is unchanged. There are no acute or suspicious osseous abnormalities. IMPRESSION: 1. Rectus diastasis with associated broad-based lower ventral abdominal wall hernia containing nonobstructed small and large bowel. 2. Colonic diverticulosis without diverticulitis. 3. Aortic and branch atherosclerosis. Aortic Atherosclerosis (ICD10-I70.0). Electronically Signed   By: Keith Rake M.D.   On: 07/10/2021 17:11    Recent Labs: Lab Results  Component Value Date   WBC 12.2 (H) 06/01/2021   HGB 13.5 06/01/2021   PLT 299.0 06/01/2021   NA 137 06/01/2021   K 4.2 06/01/2021   CL 100 06/01/2021   CO2 26 06/01/2021   GLUCOSE 98 06/01/2021   BUN 22 06/01/2021   CREATININE 0.87 06/01/2021   BILITOT 0.6 06/01/2021   ALKPHOS 55 06/01/2021   AST 29 06/01/2021   ALT 22 06/01/2021   PROT 7.5 06/01/2021   ALBUMIN 4.3 06/01/2021   CALCIUM 9.9 06/01/2021   GFRAA >60 03/22/2018    Speciality Comments: No specialty comments available.  Procedures:  No procedures performed Allergies: Azithromycin, Ibuprofen, Levaquin [levofloxacin in d5w], Tetracycline, Isovue [iopamidol], Sulfonamide derivatives, Aleve [naproxen], Tape, Zosyn [piperacillin sod-tazobactam so], Amlodipine besy-benazepril hcl, and Amoxicillin   Assessment / Plan:     Visit Diagnoses: Leukocytoclastic vasculitis (HCC)-she has not had any flare of vasculitis in the last few  years.  She is on prednisone 5 mg p.o. daily.  She has been tolerating prednisone without any side effects.  High risk medication use - Prednisone 5 mg 1 tablet by mouth daily.  She understands the long term side effects of prednisone use.  She had labs done by her PCP on Jun 01, 2021 which was stable.  WBC continues to be elevated due to prednisone use.  Her hemoglobin A1c is 5.4.  Primary osteoarthritis of both knees-she has history of severe osteoarthritis in her bilateral knee joints.  I do not have any x-rays in the system.  She continues to have pain and discomfort in her bilateral knee joints and difficulty with mobility.  She mobilizes with the help of a walker.  According to her husband she is in severe pain and discomfort due to pain in her knee joints.  I offered referral to pain management whenever she is ready.  H/O repair of rotator cuff-she has limited range of motion of bilateral shoulder joints and discomfort range of motion of her shoulders.  She also supination in her right forearm.  I advised her to schedule an appointment with Dr. Fredna Dow.  Trochanteric bursitis, right hip-she gives history of intermittent discomfort.  DDD (degenerative disc disease), cervical-she had good range of motion without any discomfort.  Psoriasis -she denies any active psoriasis lesions today.  She uses betamethasone dipropionate 0.05% cream as needed.  Osteopenia of multiple sites - calcium 1000 mg daily and vitamin D 1,000 units daily.  I do not have a DEXA scan in the system.  I offered repeating DEXA scan but she would like to wait.  History of vitamin D deficiency-vitamin D level was 52.66 on Jun 01, 2021.  Myasthenia gravis without exacerbation (HCC)-followed by neurology.  History of hyperlipidemia  History of diverticulitis  History of hypertension-blood pressure was normal today.  Paroxysmal atrial fibrillation-patient is on Eliquis .  She has been followed by cardiology.  Orders: No  orders of the defined types were placed in this encounter.  No orders of the defined types were placed in this encounter.   Face-to-face time spent with patient was 30 minutes. Greater than 50% of time was spent in counseling and coordination of care.  Follow-Up Instructions: Return in about 1 year (around 08/07/2022) for vasculitus, OA.   Bo Merino, MD  Note - This record has been created using Editor, commissioning.  Chart creation errors have been sought, but may not always  have been located. Such creation errors do not reflect on  the standard of medical care.

## 2021-08-06 ENCOUNTER — Encounter: Payer: Self-pay | Admitting: Rheumatology

## 2021-08-06 ENCOUNTER — Ambulatory Visit (INDEPENDENT_AMBULATORY_CARE_PROVIDER_SITE_OTHER): Payer: Medicare Other | Admitting: Rheumatology

## 2021-08-06 VITALS — BP 139/78 | HR 80 | Ht 62.0 in | Wt 297.0 lb

## 2021-08-06 DIAGNOSIS — M7061 Trochanteric bursitis, right hip: Secondary | ICD-10-CM | POA: Diagnosis not present

## 2021-08-06 DIAGNOSIS — Z9889 Other specified postprocedural states: Secondary | ICD-10-CM | POA: Diagnosis not present

## 2021-08-06 DIAGNOSIS — M170 Bilateral primary osteoarthritis of knee: Secondary | ICD-10-CM

## 2021-08-06 DIAGNOSIS — M310 Hypersensitivity angiitis: Secondary | ICD-10-CM

## 2021-08-06 DIAGNOSIS — M8589 Other specified disorders of bone density and structure, multiple sites: Secondary | ICD-10-CM

## 2021-08-06 DIAGNOSIS — Z8639 Personal history of other endocrine, nutritional and metabolic disease: Secondary | ICD-10-CM

## 2021-08-06 DIAGNOSIS — Z8679 Personal history of other diseases of the circulatory system: Secondary | ICD-10-CM

## 2021-08-06 DIAGNOSIS — I480 Paroxysmal atrial fibrillation: Secondary | ICD-10-CM

## 2021-08-06 DIAGNOSIS — Z79899 Other long term (current) drug therapy: Secondary | ICD-10-CM

## 2021-08-06 DIAGNOSIS — Z8719 Personal history of other diseases of the digestive system: Secondary | ICD-10-CM

## 2021-08-06 DIAGNOSIS — G7000 Myasthenia gravis without (acute) exacerbation: Secondary | ICD-10-CM

## 2021-08-06 DIAGNOSIS — M5030 Other cervical disc degeneration, unspecified cervical region: Secondary | ICD-10-CM

## 2021-08-06 DIAGNOSIS — L409 Psoriasis, unspecified: Secondary | ICD-10-CM

## 2021-08-10 ENCOUNTER — Other Ambulatory Visit: Payer: Self-pay | Admitting: Internal Medicine

## 2021-08-13 DIAGNOSIS — K439 Ventral hernia without obstruction or gangrene: Secondary | ICD-10-CM | POA: Diagnosis not present

## 2021-09-16 ENCOUNTER — Other Ambulatory Visit: Payer: Self-pay | Admitting: Physician Assistant

## 2021-09-25 ENCOUNTER — Other Ambulatory Visit: Payer: Self-pay | Admitting: Internal Medicine

## 2021-09-25 DIAGNOSIS — I10 Essential (primary) hypertension: Secondary | ICD-10-CM

## 2021-10-06 ENCOUNTER — Ambulatory Visit: Payer: Medicare Other

## 2021-10-13 ENCOUNTER — Ambulatory Visit (INDEPENDENT_AMBULATORY_CARE_PROVIDER_SITE_OTHER): Payer: Medicare Other

## 2021-10-13 ENCOUNTER — Telehealth: Payer: Medicare Other

## 2021-10-13 VITALS — Ht 62.0 in | Wt 297.0 lb

## 2021-10-13 DIAGNOSIS — Z0000 Encounter for general adult medical examination without abnormal findings: Secondary | ICD-10-CM | POA: Diagnosis not present

## 2021-10-13 NOTE — Patient Instructions (Addendum)
Kimberly [name] , Thank you for taking time to come for your Medicare Wellness Visit. I appreciate your ongoing commitment to your health goals. Please review the following plan we discussed and let me know if I can assist you in the future.   These are the goals we discussed:  Goals      Dealing with short term goals right now.     Manage My Medicine     Timeframe:  Long-Range Goal Priority:  Medium Start Date:     10/13/20                        Expected End Date:    10/13/21                   Follow Up Date October 2023   - call for medicine refill 2 or 3 days before it runs out - call if I am sick and can't take my medicine - keep a list of all the medicines I take; vitamins and herbals too - use a pillbox to sort medicine  -Get covid booster and flu, shingles and tetanus vaccines   Why is this important?   These steps will help you keep on track with your medicines.   Notes:         This is a list of the screening recommended for you and due dates:  Health Maintenance  Topic Date Due   Hepatitis C Screening: USPSTF Recommendation to screen - Ages 18-79 yo.  Never done   Tetanus Vaccine  Never done   Zoster (Shingles) Vaccine (1 of 2) Never done   DEXA scan (bone density measurement)  Never done   COVID-19 Vaccine (4 - Pfizer risk series) 09/25/2020   Flu Shot  08/11/2021   Colon Cancer Screening  01/22/2027   Pneumonia Vaccine  Completed   HPV Vaccine  Aged Out    Advanced directives: NO  Conditions/risks identified: YES  Next appointment: Follow up in one year for your annual wellness visit.   Preventive Care 75 Years and Older, Female Preventive care refers to lifestyle choices and visits with your health care provider that can promote health and wellness. What does preventive care include? A yearly physical exam. This is also called an annual well check. Dental exams once or twice a year. Routine eye exams. Ask your health care provider how often you should  have your eyes checked. Personal lifestyle choices, including: Daily care of your teeth and gums. Regular physical activity. Eating a healthy diet. Avoiding tobacco and drug use. Limiting alcohol use. Practicing safe sex. Taking low-dose aspirin every day. Taking vitamin and mineral supplements as recommended by your health care provider. What happens during an annual well check? The services and screenings done by your health care provider during your annual well check will depend on your age, overall health, lifestyle risk factors, and family history of disease. Counseling  Your health care provider may ask you questions about your: Alcohol use. Tobacco use. Drug use. Emotional well-being. Home and relationship well-being. Sexual activity. Eating habits. History of falls. Memory and ability to understand (cognition). Work and work Statistician. Reproductive health. Screening  You may have the following tests or measurements: Height, weight, and BMI. Blood pressure. Lipid and cholesterol levels. These may be checked every 5 years, or more frequently if you are over 39 years old. Skin check. Lung cancer screening. You may have this screening every year starting at age 74  if you have a 30-pack-year history of smoking and currently smoke or have quit within the past 15 years. Fecal occult blood test (FOBT) of the stool. You may have this test every year starting at age 5. Flexible sigmoidoscopy or colonoscopy. You may have a sigmoidoscopy every 5 years or a colonoscopy every 10 years starting at age 27. Hepatitis C blood test. Hepatitis B blood test. Sexually transmitted disease (STD) testing. Diabetes screening. This is done by checking your blood sugar (glucose) after you have not eaten for a while (fasting). You may have this done every 1-3 years. Bone density scan. This is done to screen for osteoporosis. You may have this done starting at age 55. Mammogram. This may be done  every 1-2 years. Talk to your health care provider about how often you should have regular mammograms. Talk with your health care provider about your test results, treatment options, and if necessary, the need for more tests. Vaccines  Your health care provider may recommend certain vaccines, such as: Influenza vaccine. This is recommended every year. Tetanus, diphtheria, and acellular pertussis (Tdap, Td) vaccine. You may need a Td booster every 10 years. Zoster vaccine. You may need this after age 12. Pneumococcal 13-valent conjugate (PCV13) vaccine. One dose is recommended after age 59. Pneumococcal polysaccharide (PPSV23) vaccine. One dose is recommended after age 42. Talk to your health care provider about which screenings and vaccines you need and how often you need them. This information is not intended to replace advice given to you by your health care provider. Make sure you discuss any questions you have with your health care provider. Document Released: 01/24/2015 Document Revised: 09/17/2015 Document Reviewed: 10/29/2014 Elsevier Interactive Patient Education  2017 [place] Prevention in the Home Falls can cause injuries. They can happen to people of all ages. There are many things you can do to make your home safe and to help prevent falls. What can I do on the outside of my home? Regularly fix the edges of walkways and driveways and fix any cracks. Remove anything that might make you trip as you walk through a door, such as a raised step or threshold. Trim any bushes or trees on the path to your home. Use bright outdoor lighting. Clear any walking paths of anything that might make someone trip, such as rocks or tools. Regularly check to see if handrails are loose or broken. Make sure that both sides of any steps have handrails. Any raised decks and porches should have guardrails on the edges. Have any leaves, snow, or ice cleared regularly. Use sand or salt on  walking paths during winter. Clean up any spills in your garage right away. This includes oil or grease spills. What can I do in the bathroom? Use night lights. Install grab bars by the toilet and in the tub and shower. Do not use towel bars as grab bars. Use non-skid mats or decals in the tub or shower. If you need to sit down in the shower, use a plastic, non-slip stool. Keep the floor dry. Clean up any water that spills on the floor as soon as it happens. Remove soap buildup in the tub or shower regularly. Attach bath mats securely with double-sided non-slip rug tape. Do not have throw rugs and other things on the floor that can make you trip. What can I do in the bedroom? Use night lights. Make sure that you have a light by your bed that is easy to reach. Do  not use any sheets or blankets that are too big for your bed. They should not hang down onto the floor. Have a firm chair that has side arms. You can use this for support while you get dressed. Do not have throw rugs and other things on the floor that can make you trip. What can I do in the kitchen? Clean up any spills right away. Avoid walking on wet floors. Keep items that you use a lot in easy-to-reach places. If you need to reach something above you, use a strong step stool that has a grab bar. Keep electrical cords out of the way. Do not use floor polish or wax that makes floors slippery. If you must use wax, use non-skid floor wax. Do not have throw rugs and other things on the floor that can make you trip. What can I do with my stairs? Do not leave any items on the stairs. Make sure that there are handrails on both sides of the stairs and use them. Fix handrails that are broken or loose. Make sure that handrails are as long as the stairways. Check any carpeting to make sure that it is firmly attached to the stairs. Fix any carpet that is loose or worn. Avoid having throw rugs at the top or bottom of the stairs. If you do  have throw rugs, attach them to the floor with carpet tape. Make sure that you have a light switch at the top of the stairs and the bottom of the stairs. If you do not have them, ask someone to add them for you. What else can I do to help prevent falls? Wear shoes that: Do not have high heels. Have rubber bottoms. Are comfortable and fit you well. Are closed at the toe. Do not wear sandals. If you use a stepladder: Make sure that it is fully opened. Do not climb a closed stepladder. Make sure that both sides of the stepladder are locked into place. Ask someone to hold it for you, if possible. Clearly mark and make sure that you can see: Any grab bars or handrails. First and last steps. Where the edge of each step is. Use tools that help you move around (mobility aids) if they are needed. These include: Canes. Walkers. Scooters. Crutches. Turn on the lights when you go into a dark area. Replace any light bulbs as soon as they burn out. Set up your furniture so you have a clear path. Avoid moving your furniture around. If any of your floors are uneven, fix them. If there are any pets around you, be aware of where they are. Review your medicines with your doctor. Some medicines can make you feel dizzy. This can increase your chance of falling. Ask your doctor what other things that you can do to help prevent falls. This information is not intended to replace advice given to you by your health care provider. Make sure you discuss any questions you have with your health care provider. Document Released: 10/24/2008 Document Revised: 06/05/2015 Document Reviewed: 02/01/2014 Elsevier Interactive Patient Education  2017 [employer].

## 2021-10-13 NOTE — Progress Notes (Signed)
Virtual Visit via Telephone Note  I connected with  Kimberly [name] on 10/13/21 at  9:30 AM EDT by telephone and verified that I am speaking with the correct person using two identifiers.  Location: Patient: HOME Provider: [facility] Persons participating in the virtual visit: patient/Nurse Health Advisor   I discussed the limitations, risks, security and privacy concerns of performing an evaluation and management service by telephone and the availability of in person appointments. The patient expressed understanding and agreed to proceed.  Interactive audio and video telecommunications were attempted between this nurse and patient, however failed, due to patient having technical difficulties OR patient did not have access to video capability.  We continued and completed visit with audio only.  Some vital signs may be absent or patient reported.   Sheral Flow, LPN  Subjective:   Kimberly [name] is a 76 y.o. female who presents for Medicare Annual (Subsequent) preventive examination.  Review of Systems     Cardiac Risk Factors include: advanced age (>35mn, >>75women);hypertension;obesity (BMI >30kg/m2);family history of premature cardiovascular disease     Objective:    Today's Vitals   10/13/21 0938  Weight: 297 lb (134.7 kg)  Height: '5\' 2"'$  (1.575 m)  PainSc: 6    Body mass index is 54.32 kg/m.     10/13/2021    9:43 AM 12/12/2020   10:46 AM 09/16/2020    5:07 PM 03/24/2017    2:00 PM 03/20/2017    7:00 AM 03/11/2017    1:26 PM 11/18/2016   12:34 PM  Advanced Directives  Does Patient Have a Medical Advance Directive? No No No  No No No  Would patient like information on creating a medical advance directive? No - Patient declined  No - Patient declined No - Patient declined   No - Patient declined    Current Medications (verified) Outpatient Encounter Medications as of 10/13/2021  Medication Sig   betamethasone dipropionate 0.05 % cream APPLY TOPICALLY  TWICE A DAY   CALCIUM PO Take 1,000 mg by mouth at bedtime.   cholecalciferol (VITAMIN D) 1000 units tablet Take 1,000 Units by mouth at bedtime.   diazepam (VALIUM) 5 MG tablet Take 1 tablet (5 mg total) by mouth every 12 (twelve) hours as needed for anxiety.   ELIQUIS 5 MG TABS tablet TAKE 1 TABLET BY MOUTH TWICE A DAY   flecainide (TAMBOCOR) 150 MG tablet Take 1 tablet (150 mg total) by mouth as needed (for AFIB).   furosemide (LASIX) 40 MG tablet TAKE 1/2 TABLET BY MOUTH DAILY   losartan (COZAAR) 100 MG tablet TAKE 1 TABLET BY MOUTH EVERY DAY   Melatonin 10 MG CAPS Take 1 capsule by mouth at bedtime.   Multiple Vitamin (MULTIVITAMIN WITH MINERALS) TABS tablet Take 1 tablet by mouth at bedtime.   predniSONE (DELTASONE) 5 MG tablet TAKE 1 TABLET BY MOUTH EVERY DAY   pyridostigmine (MESTINON) 60 MG tablet Take 1 tablet (60 mg total) by mouth 3 (three) times daily.   pyridOXINE (VITAMIN B-6) 100 MG tablet Take 100 mg by mouth at bedtime.   spironolactone (ALDACTONE) 25 MG tablet TAKE 1 TABLET BY MOUTH EVERY DAY   vitamin B-12 (CYANOCOBALAMIN) 1000 MCG tablet Take 1,000 mcg by mouth at bedtime.   zaleplon (SONATA) 5 MG capsule Take 1 capsule (5 mg total) by mouth at bedtime as needed for sleep.   No facility-administered encounter medications on file as of 10/13/2021.    Allergies (verified) Azithromycin, Ibuprofen, Levaquin [levofloxacin  in d5w], Tetracycline, Isovue [iopamidol], Sulfonamide derivatives, Aleve [naproxen], Tape, Zosyn [piperacillin sod-tazobactam so], Amlodipine besy-benazepril hcl, and Amoxicillin   History: Past Medical History:  Diagnosis Date   A-fib ([place])    Allergy    Aortic atherosclerosis ([place])    Atrial tachycardia 05/12/2015   Cataract    Colovesical fistula    Diverticulitis    Diverticulosis    DJD (degenerative joint disease), cervical    Frequent UTI    GERD (gastroesophageal reflux disease)    History of hiatal hernia    Small hernia   Hypertension     Internal hemorrhoids 01/21/2017   noted on colonopscopy   Leukocytoclastic vasculitis (HCC)    Myalgia and myositis, unspecified    Myasthenia gravis without exacerbation (HCC)    [facility], Dr Anna Genre   NSVT (nonsustained ventricular tachycardia) ([place]) 05/12/2015   Osteoarthritis    Both knees   Osteopenia    Other and unspecified hyperlipidemia    Periumbilical hernia    Pneumonia 04/2016   PONV (postoperative nausea and vomiting)    Psoriasis    plantar ;Dr Jarome Matin   Sleep difficulties    Vasculitis [facility])    Dr Estanislado Pandy   Vitamin D deficiency    Past Surgical History:  Procedure Laterality Date   APPENDECTOMY     & exploratory for inflammation; Dr Hassell Done   AV FISTULA REPAIR     CARDIAC CATHETERIZATION N/A 04/17/2015   Procedure: Left Heart Cath and Coronary Angiography;  Surgeon: Leonie Man, MD;  Location: [place] [facility];  Service: Cardiovascular;  Laterality: N/A;   CERVICAL FUSION     C5-6; Dr Vertell Limber   CHOLECYSTECTOMY     for stones   COLONOSCOPY W/ POLYPECTOMY  01/21/2017   CYSTOSCOPY WITH STENT PLACEMENT Bilateral 03/18/2017   Procedure: CYSTOSCOPY WITH RETROGRADE AND BILATERAL URETERAL STENT PLACEMENT;  Surgeon: Lucas Mallow, MD;  Location: [facility];  Service: Urology;  Laterality: Bilateral;   HERNIA REPAIR     INJECTION OF SILICONE OIL Right 02/15/3662   Procedure: INJECTION OF SILICONE OIL;  Surgeon: Jalene Mullet, MD;  Location: [place];  Service: Ophthalmology;  Laterality: Right;   IR RADIOLOGIST EVAL & MGMT  11/16/2016   IR RADIOLOGIST EVAL & MGMT  12/08/2016   IR RADIOLOGIST EVAL & MGMT  12/16/2016   LAMINECTOMY  2010   T1-2; Dr Juliette Alcide PLANA VITRECTOMY Right 09/16/2020   Procedure: PARS PLANA VITRECTOMY WITH 25 GAUGE With LASER;  Surgeon: Jalene Mullet, MD;  Location: [place];  Service: Ophthalmology;  Laterality: Right;   PARTIAL COLECTOMY N/A 03/18/2017   Procedure: OPEN SIGMOIDECTOMY;  Surgeon: Leighton Ruff, MD;  Location[facility];   Service: General;  Laterality: N/A;   REPAIR OF COMPLEX TRACTION RETINAL DETACHMENT Right 09/16/2020   Procedure: REPAIR OF COMPLEX TRACTION RETINAL DETACHMENT;  Surgeon: Jalene Mullet, MD;  Location: [place];  Service: Ophthalmology;  Laterality: Right;   SHOULDER SURGERY     Left & Right   THORACENTESIS     for peripneumonic effusion   TOTAL ABDOMINAL HYSTERECTOMY W/ BILATERAL SALPINGOOPHORECTOMY     For Fibroids & endometriosis   TOTAL SHOULDER ARTHROPLASTY     UMBILICAL HERNIA REPAIR N/A 03/18/2017   Procedure: OPEN HERNIA REPAIR UMBILICAL ADULT;  Surgeon: Leighton Ruff, MD;  [facility];  Service: General;  Laterality: N/A;  ERAs pathway   Family History  Problem Relation Age of Onset   Heart attack Mother 59   Heart failure  Mother    Stroke Paternal Grandmother        in 67s   Lung cancer Father    Cancer Father        lung   Hyperthyroidism Sister        Graves   Hypertension Sister    Diabetes Neg Hx    Colon cancer Neg Hx    Esophageal cancer Neg Hx    Stomach cancer Neg Hx    Rectal cancer Neg Hx    Social History   Socioeconomic History   Marital status: Married    Spouse name: Not on file   Number of children: Not on file   Years of education: Not on file   Highest education level: Not on file  Occupational History   Occupation: NCS/EMG Technologist    Comment: Dr Domingo Cocking  Tobacco Use   Smoking status: Never    Passive exposure: Past   Smokeless tobacco: Never  Vaping Use   Vaping Use: Never used  Substance and Sexual Activity   Alcohol use: No    Alcohol/week: 0.0 standard drinks of alcohol   Drug use: No   Sexual activity: Not Currently  Other Topics Concern   Not on file  Social History Narrative   Lives with husband in a one story home.  Had one child that only lived for 14 hours.     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          Epworth Sleepiness Scale = 2 (as of 03/14/2015)   Social Determinants  of Health   Financial Resource Strain: Low Risk  (10/13/2021)   Overall Financial Resource Strain (CARDIA)    Difficulty of Paying Living Expenses: Not hard at all  Food Insecurity: No Food Insecurity (10/13/2021)   Hunger Vital Sign    Worried About Running Out of Food in the Last Year: Never true    Ran Out of Food in the Last Year: Never true  Transportation Needs: No Transportation Needs (10/13/2021)   PRAPARE - Hydrologist (Medical): No    Lack of Transportation (Non-Medical): No  Physical Activity: Sufficiently Active (10/13/2021)   Exercise Vital Sign    Days of Exercise per Week: 5 days    Minutes of Exercise per Session: 30 min  Stress: No Stress Concern Present (10/13/2021)   [place]    Feeling of Stress : Not at all  Social Connections: [place] (10/13/2021)   Social Connection and Isolation Panel [NHANES]    Frequency of Communication with Friends and Family: More than three times a week    Frequency of Social Gatherings with Friends and Family: More than three times a week    Attends Religious Services: More than 4 times per year    Active Member of [employer] or Organizations: Yes    Attends Music therapist: More than 4 times per year    Marital Status: Married    Tobacco Counseling Counseling given: Not Answered   Clinical Intake:  Pre-visit preparation completed: Yes  Pain : 0-10 Pain Score: 6  Pain Type: Chronic pain Pain Location: Generalized     BMI - recorded: 54.32 Nutritional Risks: None Diabetes: No  How often do you need to have someone help you when you read instructions, pamphlets, or other written materials from your doctor or pharmacy?: 1 - Never What is the last grade level  you completed in school?: [employer]; Associate's Degree in EMG Tech/NCS  Diabetic? NO  Interpreter Needed?: No  Information entered by :: Lisette Abu,  LPN.   Activities of Daily Living    10/13/2021   12:35 PM  In your present state of health, do you have any difficulty performing the following activities:  Hearing? 0  Vision? 1  Comment Detached Retina  Difficulty concentrating or making decisions? 0  Walking or climbing stairs? 0  Dressing or bathing? 1  Doing errands, shopping? 1  Preparing Food and eating ? N  Using the Toilet? N  In the past six months, have you accidently leaked urine? Y  Do you have problems with loss of bowel control? Y  Managing your Medications? N  Managing your Finances? N  Housekeeping or managing your Housekeeping? Y    Patient Care Team: [name], MD as PCP - General (Internal Medicine) Skeet Latch, MD as PCP - Cardiology (Cardiology) Alda Berthold, DO as Consulting Physician (Neurology) [place], Darnelle Maffucci, [facility] (Inactive) (Pharmacist)  Indicate any recent Medical Services you may have received from other than [facility] providers in the past year (date may be approximate).     Assessment:   This is a routine wellness examination for Kimberly [name].  Hearing/Vision screen Hearing Screening - Comments:: Denies hearing difficulties   Vision Screening - Comments:: Wears rx glasses - up to date with routine [facility] exams with [facility]   Dietary issues and exercise activities discussed: Current Exercise Habits: Home exercise routine, Type of exercise: Other - see comments (chair exercises, PT & OT exercises), Time (Minutes): 30, Frequency (Times/Week): 5, Weekly Exercise (Minutes/Week): 150, Intensity: Mild, Exercise limited by: orthopedic condition(s);cardiac condition(s)   Goals Addressed             This Visit's Progress    Dealing with short term goals right now.        Depression Screen    10/13/2021    9:46 AM 05/21/2016    1:04 PM 05/09/2015    8:27 AM  PHQ 2/9 Scores  PHQ - 2 Score 0 0 0    Fall Risk    10/13/2021    9:45 AM 12/12/2020   10:45 AM 02/10/2018   10:54 AM  11/30/2017    1:15 PM 08/09/2016    9:45 AM  Fall Risk   Falls in the past year? 0 0 0 0 No  Comment    Emmi Telephone Survey: data to providers prior to load   Number falls in past yr: 0 0 0    Injury with Fall? 0 0 0    Risk for fall due to : No Fall Risks      Follow up Falls prevention discussed  Falls evaluation completed      [place]:  Any stairs in or around the home? Yes  If so, are there any without handrails? No  Home free of loose throw rugs in walkways, pet beds, electrical cords, etc? Yes  Adequate lighting in your home to reduce risk of falls? Yes   ASSISTIVE DEVICES UTILIZED TO PREVENT FALLS:  Life alert? No  Use of a cane, walker or w/c? No  Grab bars in the bathroom? Yes  Shower chair or bench in shower? Yes  Elevated toilet seat or a handicapped toilet? Yes   TIMED UP AND GO:  Was the test performed? No . PHONE VISIT  Cognitive Function:  10/13/2021    9:46 AM  6CIT Screen  What Year? 0 points  What month? 0 points  What time? 0 points  Count back from 20 0 points  Months in reverse 0 points  Repeat phrase 0 points  Total Score 0 points    Immunizations Immunization History  Administered Date(s) Administered   Influenza, High Dose Seasonal PF 09/11/2012, 11/01/2016, 11/24/2017   Influenza,inj,Quad PF,6+ Mos 10/12/2014   Influenza-Unspecified 10/11/2013, 10/24/2015   PFIZER(Purple Top)SARS-COV-2 Vaccination 11/04/2019, 12/02/2019, 07/31/2020   Pfizer Covid-19 Vaccine Bivalent Booster 15yr & up 12/04/2019   Pneumococcal Conjugate-13 05/09/2015   Pneumococcal Polysaccharide-23 04/19/2013    TDAP status: Due, Education has been provided regarding the importance of this vaccine. Advised may receive this vaccine at local pharmacy or Health Dept. Aware to provide a copy of the vaccination record if obtained from local pharmacy or Health Dept. Verbalized acceptance and understanding.  Flu Vaccine status:  Due, Education has been provided regarding the importance of this vaccine. Advised may receive this vaccine at local pharmacy or Health Dept. Aware to provide a copy of the vaccination record if obtained from local pharmacy or Health Dept. Verbalized acceptance and understanding.  Pneumococcal vaccine status: Up to date  Covid-19 vaccine status: Completed vaccines  Qualifies for Shingles Vaccine? Yes   Zostavax completed No   Shingrix Completed?: No.    Education has been provided regarding the importance of this vaccine. Patient has been advised to call insurance company to determine out of pocket expense if they have not yet received this vaccine. Advised may also receive vaccine at local pharmacy or Health Dept. Verbalized acceptance and understanding.  Screening Tests Health Maintenance  Topic Date Due   Hepatitis C Screening  Never done   TETANUS/TDAP  Never done   Zoster Vaccines- Shingrix (1 of 2) Never done   DEXA SCAN  Never done   COVID-19 Vaccine (4 - Pfizer risk series) 09/25/2020   INFLUENZA VACCINE  08/11/2021   COLONOSCOPY (Pts 45-413yrInsurance coverage will need to be confirmed)  01/22/2027   Pneumonia Vaccine 6527Years old  Completed   HPV VACCINES  Aged Out    Health Maintenance  Health Maintenance Due  Topic Date Due   Hepatitis C Screening  Never done   TETANUS/TDAP  Never done   Zoster Vaccines- Shingrix (1 of 2) Never done   DEXA SCAN  Never done   COVID-19 Vaccine (4 - Pfizer risk series) 09/25/2020   INFLUENZA VACCINE  08/11/2021    Colorectal cancer screening: Type of screening: Colonoscopy. Completed 01/21/2017. Repeat every 10 years  Mammography status: patient will schedule  Bone Density status: never done  Lung Cancer Screening: (Low Dose CT Chest recommended if Age 238-80ears, 30 pack-year currently smoking OR have quit w/in 15years.) does not qualify.   Lung Cancer Screening Referral: NO  Additional Screening:  Hepatitis C Screening:  does qualify; Completed NO  Vision Screening: Recommended annual ophthalmology exams for early detection of glaucoma and other disorders of the eye. Is the patient up to date with their annual eye exam?  Yes  Who is the provider or what is the name of the office in which the patient attends annual eye exams? [facility] If pt is not established with a provider, would they like to be referred to a provider to establish care? No .   Dental Screening: Recommended annual dental exams for proper oral hygiene  Community Resource Referral / Chronic Care Management: CRR required this  visit?  No   CCM required this visit?  No      Plan:     I have personally reviewed and noted the following in the patient's chart:   Medical and social history Use of alcohol, tobacco or illicit drugs  Current medications and supplements including opioid prescriptions. Patient is not currently taking opioid prescriptions. Functional ability and status Nutritional status Physical activity Advanced directives List of other physicians Hospitalizations, surgeries, and ER visits in previous 12 months Vitals Screenings to include cognitive, depression, and falls Referrals and appointments  In addition, I have reviewed and discussed with patient certain preventive protocols, quality metrics, and best practice recommendations. A written personalized care plan for preventive services as well as general preventive health recommendations were provided to patient.     Sheral Flow, LPN   12/12/5832   Nurse Notes: N/A

## 2021-10-19 ENCOUNTER — Other Ambulatory Visit: Payer: Self-pay | Admitting: Internal Medicine

## 2021-10-20 ENCOUNTER — Ambulatory Visit (HOSPITAL_BASED_OUTPATIENT_CLINIC_OR_DEPARTMENT_OTHER): Payer: Medicare Other | Admitting: Cardiovascular Disease

## 2021-10-21 ENCOUNTER — Other Ambulatory Visit: Payer: Self-pay | Admitting: Physician Assistant

## 2021-10-21 NOTE — Telephone Encounter (Signed)
Reached out to patient to schedule follow up appointment. Patient states there is no way she can schedule out that far. Patient states she will call back to schedule.

## 2021-10-21 NOTE — Telephone Encounter (Signed)
Next Visit: Due January 2024. Message sent to the front to schedule.    Last Visit: 08/06/2021   Last Fill: 07/13/2021   Dx: Leukocytoclastic vasculitis   Current Dose per office note on 08/06/2021: Prednisone 5 mg 1 tablet by mouth daily   Okay to refill Prednisone?   

## 2021-10-21 NOTE — Telephone Encounter (Signed)
Please schedule patient a follow up visit. Patient due January 2024. Thanks!   Return in about 1 year (around 08/07/2022) for vasculitus, OA.

## 2021-11-02 ENCOUNTER — Ambulatory Visit (INDEPENDENT_AMBULATORY_CARE_PROVIDER_SITE_OTHER): Payer: Medicare Other | Admitting: Cardiovascular Disease

## 2021-11-02 ENCOUNTER — Encounter (HOSPITAL_BASED_OUTPATIENT_CLINIC_OR_DEPARTMENT_OTHER): Payer: Self-pay | Admitting: Cardiovascular Disease

## 2021-11-02 VITALS — BP 132/68 | HR 76 | Ht 62.0 in | Wt 297.0 lb

## 2021-11-02 DIAGNOSIS — E782 Mixed hyperlipidemia: Secondary | ICD-10-CM

## 2021-11-02 DIAGNOSIS — I700 Atherosclerosis of aorta: Secondary | ICD-10-CM | POA: Diagnosis not present

## 2021-11-02 DIAGNOSIS — I480 Paroxysmal atrial fibrillation: Secondary | ICD-10-CM

## 2021-11-02 DIAGNOSIS — I10 Essential (primary) hypertension: Secondary | ICD-10-CM

## 2021-11-02 DIAGNOSIS — I2510 Atherosclerotic heart disease of native coronary artery without angina pectoris: Secondary | ICD-10-CM

## 2021-11-02 HISTORY — DX: Atherosclerosis of aorta: I70.0

## 2021-11-02 NOTE — Assessment & Plan Note (Signed)
Blood pressure marginally controlled.  Her goal is less than 130/80.  Continue losartan, spironolactone, and Lasix.

## 2021-11-02 NOTE — Assessment & Plan Note (Signed)
She was recently advised increased the protein in her diet to help with her hernia.  She has been eating very few carbs.  We discussed importance of choosing lean proteins.  This is not an ideal diet for someone with coronary and aortic atherosclerosis.  Starting Repatha as above.

## 2021-11-02 NOTE — Patient Instructions (Signed)
Medication Instructions:  Your physician recommends that you continue on your current medications as directed. Please refer to the Current Medication list given to you today.   *If you need a refill on your cardiac medications before your next appointment, please call your pharmacy*  Lab Work: NONE  Testing/Procedures: NONE   Follow-Up: At [facility], you and your health needs are our priority.  As part of our continuing mission to provide you with exceptional heart care, we have created designated Provider Care Teams.  These Care Teams include your primary Cardiologist (physician) and Advanced Practice Providers (APPs -  Physician Assistants and Nurse Practitioners) who all work together to provide you with the care you need, when you need it.  We recommend signing up for the patient portal called "MyChart".  Sign up information is provided on this After Visit Summary.  MyChart is used to connect with patients for Virtual Visits (Telemedicine).  Patients are able to view lab/test results, encounter notes, upcoming appointments, etc.  Non-urgent messages can be sent to your provider as well.   To learn more about what you can do with MyChart, go to [email or web address].    Your next appointment:   6 month(s)  The format for your next appointment:   In Person  Provider:   Skeet Latch, MD    You have been referred to [place]

## 2021-11-02 NOTE — Assessment & Plan Note (Signed)
Noted on CT. LDL goal is <70.  She has tried several statins in the past and did not tolerate them 2/2 myalgias.  She also has myasthenia gravis.  We will have her meet with our pharmacist to try and get her on Repatha.

## 2021-11-02 NOTE — Assessment & Plan Note (Signed)
She had an episode of atrial fibrillation.  This episode lasted longer than usual.  She did not take the flecainide. Continue Eliquis.

## 2021-11-02 NOTE — Assessment & Plan Note (Signed)
Asymptomatic coronary calcification.  Lipids aren't controlled. She hasn't tolerated statins. She is on Eliquis, so no aspirin.

## 2021-11-02 NOTE — Progress Notes (Signed)
Cardiology Office Note   Date:  11/02/2021   ID:  Kimberly [name], DOB 1945/05/05, MRN [id number]  PCP:  [name], MD  Cardiologist:  Skeet Latch, MD  Electrophysiologist:  None   Evaluation Performed:  Follow-Up Visit  Chief Complaint:  Atrial fibrillation  History of Present Illness:    Kimberly [name] is a 76 y.o. female with paroxysmal atrial fibrillation, hypertension, coronary artery calcification, myasthenia gravis, R LE lymphedema, recurrent R pleural effusion, hyperlipidemia who presents for follow up. She was initially seen 03/2015 for evaluation of asymptomatic coronary calcifications.  She had a chest CT to follow up on pleural effusions and was noted to have coronary calcification. She was asymptomatic but did not exercise much due to myasthenia.  She had a [employer] 03/2015 that was concerning for inferior and apical ischemia.  She subsequently underwent left heart catheterization 04/2015 that showed normal, but tortuous coronary arteries.  She reported palpitations and had a 30 day event monitor that showed paroxysmal atrial tachycardia and NSVT. She was not started on any nodal agents due to her history of myasthenia.  She had an echo 05/2015 that revealed LVEF 60-65% with normal diastolic function.   Ms. Kimberly [name] was admitted 03/2017 for repair of a colovesicular fistula.  On postoperative day 6 she developed atrial fibrillation with rapid ventricular response.  She received 1 dose of IV metoprolol but was switched to amiodarone given her myasthenia.  She converted back to sinus rhythm on amiodarone and was started on Eliquis.  She followed up with Almyra Deforest, PA, 03/2017 and reported dizziness and nausea that was thought to be due to the amiodarone.  Given that her atrial for relation was thought to be postoperative amiodarone was decreased with plans to discontinue it.  She did not have any recurrent atrial fibrillation after that time.  She again wore a  30-day event monitor that was without atrial fibrillation. She had an episode of afib 03/2018 and was seen in the atrial fibrillation clinic.  She was restarted on Eliquis. She was started on flecainide to be taken as needed.    At the last visit with me she was doing well and had rare episodes of atrial fibrillation. Today, patient notes that she has been doing well overall apart from having 1 episode of A-fib x2 weeks ago. She reports having severe left-sided jaw pain and a headache prior to her A-fib episodes. Her most recent episode lasted a few hours, but she did not take a Flecainide as she was home by herself and scared that she would have a reaction to the medications. She has not had any further episodes of A-fib. She states that she has been under a lot of stress. She denies having any chest pain or pressure. Patient reports that her blood pressure has been well controlled at home. She states that her leg swelling has significantly improved. She has tried 2 different statins in the past and could not tolerate them due to severe myalgias and her history of Myasthenia Gravis.  She reports a secondary complaint of a mild productive cough for the last few days. She states that the cough is most bothersome in the mornings and at night but clears up throughout the day. She notes that her symptoms were precipitated by a sore throat but this has resolved. She notes an accompanying mild headache and sinus pressure. She has not tested for COVID. She feels that her symptoms are due to seasonal allergies. She denies  any shortness of breath. Patient is following with her general surgeon Dr. Hassell Done regarding her ventral hernia who advised her to follow a high protein low carb diet.  Past Medical History:  Diagnosis Date   Allergy    Aortic atherosclerosis ([place]) 11/02/2021   Atrial tachycardia 05/12/2015   Cataract    Colovesical fistula    Diverticulitis    Diverticulosis    DJD (degenerative joint  disease), cervical    Frequent UTI    GERD (gastroesophageal reflux disease)    History of hiatal hernia    Small hernia   Hypertension    Internal hemorrhoids 01/21/2017   noted on colonopscopy   Leukocytoclastic vasculitis (HCC)    Myalgia and myositis, unspecified    Myasthenia gravis without exacerbation (HCC)    [facility], Dr Anna Genre   NSVT (nonsustained ventricular tachycardia) ([place]) 05/12/2015   Osteoarthritis    Both knees   Osteopenia    Other and unspecified hyperlipidemia    Periumbilical hernia    Pneumonia 04/2016   PONV (postoperative nausea and vomiting)    Psoriasis    plantar ;Dr Jarome Matin   Sleep difficulties    Vasculitis [facility])    Dr Estanislado Pandy   Vitamin D deficiency    Past Surgical History:  Procedure Laterality Date   APPENDECTOMY     & exploratory for inflammation; Dr Hassell Done   AV FISTULA REPAIR     CARDIAC CATHETERIZATION N/A 04/17/2015   Procedure: Left Heart Cath and Coronary Angiography;  Surgeon: Leonie Man, MD;  Location: [facility];  Service: Cardiovascular;  Laterality: N/A;   CERVICAL FUSION     C5-6; Dr Vertell Limber   CHOLECYSTECTOMY     for stones   COLONOSCOPY W/ POLYPECTOMY  01/21/2017   CYSTOSCOPY WITH STENT PLACEMENT Bilateral 03/18/2017   Procedure: CYSTOSCOPY WITH RETROGRADE AND BILATERAL URETERAL STENT PLACEMENT;  Surgeon: Lucas Mallow, MD;  Location: [facility];  Service: Urology;  Laterality: Bilateral;   HERNIA REPAIR     INJECTION OF SILICONE OIL Right 07/19/4694   Procedure: INJECTION OF SILICONE OIL;  Surgeon: Jalene Mullet, MD;  Location: [facility];  Service: Ophthalmology;  Laterality: Right;   IR RADIOLOGIST EVAL & MGMT  11/16/2016   IR RADIOLOGIST EVAL & MGMT  12/08/2016   IR RADIOLOGIST EVAL & MGMT  12/16/2016   LAMINECTOMY  2010   T1-2; Dr Juliette Alcide PLANA VITRECTOMY Right 09/16/2020   Procedure: PARS PLANA VITRECTOMY WITH 25 GAUGE With LASER;  Surgeon: Jalene Mullet, MD;  Location: [facility];  Service: Ophthalmology;   Laterality: Right;   PARTIAL COLECTOMY N/A 03/18/2017   Procedure: OPEN SIGMOIDECTOMY;  Surgeon: Leighton Ruff, MD;  Location: [facility];  Service: General;  Laterality: N/A;   REPAIR OF COMPLEX TRACTION RETINAL DETACHMENT Right 09/16/2020   Procedure: REPAIR OF COMPLEX TRACTION RETINAL DETACHMENT;  Surgeon: Jalene Mullet, MD;  Location: [facility];  Service: Ophthalmology;  Laterality: Right;   SHOULDER SURGERY     Left & Right   THORACENTESIS     for peripneumonic effusion   TOTAL ABDOMINAL HYSTERECTOMY W/ BILATERAL SALPINGOOPHORECTOMY     For Fibroids & endometriosis   TOTAL SHOULDER ARTHROPLASTY     UMBILICAL HERNIA REPAIR N/A 03/18/2017   Procedure: OPEN HERNIA REPAIR UMBILICAL ADULT;  Surgeon: Leighton Ruff, MD;  Location: [facility];  Service: General;  Laterality: N/A;  ERAs pathway     Current Meds  Medication Sig   betamethasone dipropionate 0.05 % cream APPLY TOPICALLY  TWICE A DAY   CALCIUM PO Take 1,000 mg by mouth at bedtime.   cholecalciferol (VITAMIN D) 1000 units tablet Take 1,000 Units by mouth at bedtime.   diazepam (VALIUM) 5 MG tablet TAKE 1 TABLET BY MOUTH EVERY 12 HOURS AS NEEDED FOR ANXIETY.   ELIQUIS 5 MG TABS tablet TAKE 1 TABLET BY MOUTH TWICE A DAY   flecainide (TAMBOCOR) 150 MG tablet Take 1 tablet (150 mg total) by mouth as needed (for AFIB).   furosemide (LASIX) 40 MG tablet TAKE 1/2 TABLET BY MOUTH DAILY   losartan (COZAAR) 100 MG tablet TAKE 1 TABLET BY MOUTH EVERY DAY   Melatonin 10 MG CAPS Take 1 capsule by mouth at bedtime.   Multiple Vitamin (MULTIVITAMIN WITH MINERALS) TABS tablet Take 1 tablet by mouth at bedtime.   predniSONE (DELTASONE) 5 MG tablet TAKE 1 TABLET BY MOUTH EVERY DAY   pyridostigmine (MESTINON) 60 MG tablet Take 1 tablet (60 mg total) by mouth 3 (three) times daily.   pyridOXINE (VITAMIN B-6) 100 MG tablet Take 100 mg by mouth at bedtime.   spironolactone (ALDACTONE) 25 MG tablet TAKE 1 TABLET BY MOUTH EVERY DAY   vitamin B-12  (CYANOCOBALAMIN) 1000 MCG tablet Take 1,000 mcg by mouth at bedtime.   zaleplon (SONATA) 5 MG capsule Take 1 capsule (5 mg total) by mouth at bedtime as needed for sleep.     Allergies:   Azithromycin, Ibuprofen, Levaquin [levofloxacin in d5w], Tetracycline, Isovue [iopamidol], Sulfonamide derivatives, Aleve [naproxen], Tape, Zosyn [piperacillin sod-tazobactam so], Amlodipine besy-benazepril hcl, and Amoxicillin   Social History   Tobacco Use   Smoking status: Never    Passive exposure: Past   Smokeless tobacco: Never  Vaping Use   Vaping Use: Never used  Substance Use Topics   Alcohol use: No    Alcohol/week: 0.0 standard drinks of alcohol   Drug use: No     Family Hx: The patient's family history includes Cancer in her father; Heart attack (age of onset: 47) in her mother; Heart failure in her mother; Hypertension in her sister; Hyperthyroidism in her sister; Lung cancer in her father; Stroke in her paternal grandmother. There is no history of Diabetes, Colon cancer, Esophageal cancer, Stomach cancer, or Rectal cancer.  ROS:   Please see the history of present illness.    (+) Cough (+) Sinus pressure (+) Headache All other systems reviewed and are negative.   Prior CV studies:   The following studies were reviewed today:  Monitor 5/19:  30 Day Event Monitor   Quality: Fair.  Baseline artifact. Predominant rhythm: sinus rhythm. Average heart rate: 68 bpm   No arrhythmias noted.  ECHO 3/19:  Impressions:   - Normal LV size with mild LV hypertrophy. EF 55-60%. Normal RV    size and systolic function. No significant valvular    abnormalities.   Echo 05/26/15: Study Conclusions   - Left ventricle: The cavity size was mildly dilated. Systolic   function was normal. The estimated ejection fraction was in the   range of 60% to 65%. Wall motion was normal; there were no   regional wall motion abnormalities. Left ventricular diastolic   function parameters were  normal. - Aortic valve: Transvalvular velocity was within the normal range.   There was no stenosis. There was no regurgitation. - Mitral valve: There was no regurgitation. - Left atrium: The atrium was mildly dilated. - Right ventricle: The cavity size was normal. Wall thickness was   normal. Systolic function was  normal. - Tricuspid valve: There was no regurgitation. - Inferior vena cava: The vessel was normal in size. The   respirophasic diameter changes were in the normal range (= 50%),   consistent with normal central venous pressure.     28 Day Event Monitor 04/01/15:   Quality: Fair.  Baseline artifact. Predominant rhythm: sinus rhythm Average heart rate: 70 bpm Max heart rate: 126 bpm Min heart rate: 50 bpm   Short runs of atrial tachycardia lasting up to 11 seconds.  One 6 beat run of NSVT.    Lexiscan Cardiolite 03/28/15: Nuclear stress EF: 71%. The left ventricular ejection fraction is hyperdynamic (>65%). There was no ST segment deviation noted during stress. This is an intermediate risk study. Findings consistent with ischemia.   Technically difficult study due to increased subdiaphragmatic activity; intermediate risk with small, severe, reversible defects in the apical and inferior basal walls consistent with ischemia; EF 71 with normal wall motion.    LHC 04/17/15: The left ventricular systolic function is normal. Angiographically normal coronary arteries, but very tortuous     Angiographically no evidence of any significant lesions to explain the patient's abnormal stress test. There does appear to be mild calcification in the LAD distribution, but certainly not on the luminal aspect.  Labs/Other Tests and Data Reviewed:    EKG: EKG is ordered today. EKG reviewed and demonstrated sinus rhythm. Rate of 76bpm. Nonspecific T wave abnormalities. 03/23/2021: Sinus rhythm. Rate 77 bpm. Nonspecific T wave abnormalities 9/22: sinus rhythm. Rate 68 bpm.  03/26/2020: Sinus  rhythm.  Rate 79 bpm.  Nonspecific T wave abnormalities. 03/22/2018: Sinus rhythm. Rate 77 bpm. Non-specific ST-T changes.  Recent Labs: 06/01/2021: ALT 22; BUN 22; Creatinine, Ser 0.87; Hemoglobin 13.5; Platelets 299.0; Potassium 4.2; Sodium 137; TSH 2.10   Recent Lipid Panel Lab Results  Component Value Date/Time   CHOL 200 06/01/2021 04:30 PM   CHOL 256 (H) 08/09/2014 09:28 AM   TRIG 93.0 06/01/2021 04:30 PM   TRIG 180 (H) 08/09/2014 09:28 AM   HDL 64.30 06/01/2021 04:30 PM   HDL 74 08/09/2014 09:28 AM   CHOLHDL 3 06/01/2021 04:30 PM   LDLCALC 117 (H) 06/01/2021 04:30 PM   LDLCALC 146 (H) 08/09/2014 09:28 AM    Wt Readings from Last 3 Encounters:  11/02/21 297 lb (134.7 kg)  10/13/21 297 lb (134.7 kg)  08/06/21 297 lb (134.7 kg)     Objective:    VS:  BP 132/68 (BP Location: Right Arm)   Pulse 76   Ht '5\' 2"'$  (1.575 m)   Wt 297 lb (134.7 kg)   BMI 54.32 kg/m  , BMI Body mass index is 54.32 kg/m. GENERAL:  Well appearing; In wheelchair. HEENT: Pupils equal round and reactive, fundi not visualized, oral mucosa unremarkable NECK:  No jugular venous distention, waveform within normal limits, carotid upstroke brisk and symmetric, no bruits, no thyromegaly LUNGS:  Clear to auscultation bilaterally HEART:  RRR.  PMI not displaced or sustained,S1 and S2 within normal limits, no S3, no S4, no clicks, no rubs, no murmurs ABD:  Central adiposity.  positive bowel sounds normal in frequency in pitch, no bruits, no rebound, no guarding, no midline pulsatile mass, no hepatomegaly, no splenomegaly EXT:  2 plus pulses throughout, 2+ R LE edema. 1+ LLE edema, no cyanosis no clubbing SKIN:  No rashes no nodules NEURO:  Cranial nerves II through XII grossly intact, motor grossly intact throughout PSYCH:  Cognitively intact, oriented to person place and time  ASSESSMENT & PLAN:    Coronary artery calcification seen on CAT scan Asymptomatic coronary calcification.  Lipids aren't  controlled. She hasn't tolerated statins. She is on Eliquis, so no aspirin.  Paroxysmal atrial fibrillation (HCC) She had an episode of atrial fibrillation.  This episode lasted longer than usual.  She did not take the flecainide. Continue Eliquis.  Aortic atherosclerosis ([place]) Noted on CT. LDL goal is <70.  She has tried several statins in the past and did not tolerate them 2/2 myalgias.  She also has myasthenia gravis.  We will have her meet with our pharmacist to try and get her on Repatha.  Essential hypertension Blood pressure marginally controlled.  Her goal is less than 130/80.  Continue losartan, spironolactone, and Lasix.  Hyperlipidemia She was recently advised increased the protein in her diet to help with her hernia.  She has been eating very few carbs.  We discussed importance of choosing lean proteins.  This is not an ideal diet for someone with coronary and aortic atherosclerosis.  Starting Repatha as above.     Medication Adjustments/Labs and Tests Ordered: Current medicines are reviewed at length with the patient today.  Concerns regarding medicines are outlined above.   Tests Ordered: Orders Placed This Encounter  Procedures   AMB Referral to [facility]-D   EKG 12-Lead   Medication Changes: No orders of the defined types were placed in this encounter.  Disposition:  Shewanda Sharpe C. Oval Linsey, MD, [facility] in 6 months   I,Alexis Herring,acting as a Education administrator for Skeet Latch, MD.,have documented all relevant documentation on the behalf of Skeet Latch, MD,as directed by  Skeet Latch, MD while in the presence of Skeet Latch, MD.  I, [place] Oval Linsey, MD have reviewed all documentation for this visit.  The documentation of the exam, diagnosis, procedures, and orders on 11/02/2021 are all accurate and complete.    Signed, Skeet Latch, MD  11/02/2021 2:41 PM    [place]

## 2021-11-04 ENCOUNTER — Other Ambulatory Visit (HOSPITAL_COMMUNITY): Payer: Self-pay | Admitting: Cardiovascular Disease

## 2021-11-05 NOTE — Telephone Encounter (Signed)
Prescription refill request for Eliquis received. Indication: afib  Last office visit: 11/02/2021 Scr: 0.87, 06/01/2021 Age: 76 yo Weight: 134.7 kg  Refill sent.

## 2021-11-30 ENCOUNTER — Other Ambulatory Visit: Payer: Self-pay | Admitting: Internal Medicine

## 2021-11-30 ENCOUNTER — Other Ambulatory Visit: Payer: Self-pay | Admitting: Rheumatology

## 2021-12-01 NOTE — Telephone Encounter (Signed)
Next Visit: Due January 2024. Message sent to the front to schedule.    Last Visit: 08/06/2021   Last Fill: 07/13/2021   Dx: Leukocytoclastic vasculitis   Current Dose per office note on 08/06/2021: Prednisone 5 mg 1 tablet by mouth daily   Okay to refill Prednisone?

## 2021-12-01 NOTE — Telephone Encounter (Signed)
Please schedule patient a follow up visit. Patient due January 2024. Thanks!

## 2021-12-07 ENCOUNTER — Telehealth: Payer: Self-pay | Admitting: Cardiovascular Disease

## 2021-12-07 DIAGNOSIS — I776 Arteritis, unspecified: Secondary | ICD-10-CM

## 2021-12-07 DIAGNOSIS — E785 Hyperlipidemia, unspecified: Secondary | ICD-10-CM

## 2021-12-07 DIAGNOSIS — E782 Mixed hyperlipidemia: Secondary | ICD-10-CM

## 2021-12-07 MED ORDER — EZETIMIBE 10 MG PO TABS: 10.0000 mg | ORAL_TABLET | Freq: Every day | ORAL | 3 refills | Status: DC

## 2021-12-07 NOTE — Telephone Encounter (Signed)
Send in prescription for ezetimibe 10 mg qd and have her repeat labs after 3 months

## 2021-12-07 NOTE — Telephone Encounter (Signed)
We don't have any history in her char that tells [place] which statins she has tried in the past.  There are really just 3 options for non-injectable medications. Statins - she apparently has already tried several (?), and with her myasthenia gravis, if we were to re-challenge, would go with a low dose such as 5 mg once or twice weekly. Ezetimibe - doesn't have much cholesterol lowering ability - about 15-20%, so might bring her LDL down closer to 100.  Nexletol - this would be more effective, but is brand name, so the whole "donut hole" issue would apply.    See if she has any preferences

## 2021-12-07 NOTE — Telephone Encounter (Signed)
Pt would like a callback regarding whether a oral Statin mediation can be prescribed verses an injectable medication. Please advise

## 2021-12-07 NOTE — Telephone Encounter (Signed)
Returned  call to patient, she thinks she previously took Lipitor, she states she is doing a low cholesterol diet. Patient would like to trial the Zetia. Patient is skeptical about starting any new medications, but states she will try the Zetia.

## 2021-12-07 NOTE — Telephone Encounter (Signed)
Returned call to patient, patient states that she does not want to the injectable medication for cholesterol, she had an appointment with pharmd on 11/29 that she cancelled. She previously took an oral statin, but cannot remember the name. She states she is already in the donut hole every year by June. She would like Dr. Blenda Mounts advice on an oral cholesterol medication that she could take instead of the injectable.   Routing to Dr. Oval Linsey and PharmD for review and advisement.

## 2021-12-09 ENCOUNTER — Ambulatory Visit (HOSPITAL_BASED_OUTPATIENT_CLINIC_OR_DEPARTMENT_OTHER): Payer: Medicare Other

## 2021-12-14 ENCOUNTER — Ambulatory Visit (INDEPENDENT_AMBULATORY_CARE_PROVIDER_SITE_OTHER): Payer: Medicare Other | Admitting: Neurology

## 2021-12-14 ENCOUNTER — Encounter: Payer: Self-pay | Admitting: Neurology

## 2021-12-14 VITALS — BP 132/75 | HR 76 | Ht 62.0 in | Wt 297.0 lb

## 2021-12-14 DIAGNOSIS — I2510 Atherosclerotic heart disease of native coronary artery without angina pectoris: Secondary | ICD-10-CM | POA: Diagnosis not present

## 2021-12-14 DIAGNOSIS — G7000 Myasthenia gravis without (acute) exacerbation: Secondary | ICD-10-CM | POA: Diagnosis not present

## 2021-12-14 DIAGNOSIS — R292 Abnormal reflex: Secondary | ICD-10-CM | POA: Diagnosis not present

## 2021-12-14 MED ORDER — PYRIDOSTIGMINE BROMIDE 60 MG PO TABS: 60.0000 mg | ORAL_TABLET | Freq: Three times a day (TID) | ORAL | 3 refills | Status: DC

## 2021-12-14 NOTE — Patient Instructions (Signed)
If your would like to proceed with MRI of the cervical spine, please call my office.  Return to clinic in 1 year

## 2021-12-14 NOTE — Progress Notes (Signed)
Follow-up Visit   Date: 12/14/21    Kimberly [name] MRN: [id number] DOB: 06/16/1945   Interim History: Kimberly [name] is a 76 y.o. right-handed Caucasian female with with osteoarthritis, leg edema, leukocytoclastic vasculitis (2010) being followed by Dr. Estanislado Pandy, thoracic T1-2 laminectomy (2011), and cervical fusion at C5-6 (2008), and paroxysmal atrial fibrillation returning to the clinic for follow-up of seronegative myasthenia gravis. She is retired Nurse, learning disability.  IMPRESSION/PLAN: Seronegative myasthenia gravis (Dx 2011 by RNS and SFEMG at [facility]) without exacerbation.  Exam shows right ptosis, stable.  - Continue prednisone '5mg'$  daily which she takes for leukocytoclastic vasculitis  - Continue mestinon '60mg'$  TID  2.  Generalized weakness in the setting of hyperreflexia (new) could indicate cervical canal stenosis. She has history of prior cervical fusion and may have adjacent disease.  MRI cervical spine was offered, however, because she does not want surgery, she opted not to obtain imaging.   2.  Right cerebellar lesion, less likely MS.  Two prior unsuccessful LPs and work-up at [facility].   Return to clinic in 1 year  ---------------------------------------------------------------- History of present illness: In 2011, patient started developing diplopia, blurred vision, ptosis, and gait abnormality for which she initially saw Dr. 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 She underwent MRI/A/V of the brain which showed T2 abnormality in the right cerebellar lesion so was referred her to Dr. Anna Genre at [facility]  who took over her care.  Per review of Dr. Alberteen Sam notes, the lesion was thought to be nonspecific and likely incidental as it would not explain all her symptoms.  She had two unsuccessful attempts with lumbar puncture and because of low suspicion for MS, it was not pursued again.  In the meantime, she also underwent repetitive nerve stimulation  with single fiber EMG which showed evidence of neuromuscular junction disorder.   AChR antibodies were negative. At the time of her myasthenia gravis diagnosis, patient was already taking prednisone (possibly '30mg'$ ?) for vasculitis, so was started on mestinon '60mg'$  three times daily which helped her muscle strength and resolved her diplopia.  She remained on prednisone but during the winters of 2013 and 2014, she was was struggling with pneumonia with pleural effusion requiring thoracentesis.  Because of this, she was started on methotrexate in January 2015 and reports not having pneumonia this past winter.  She has never been hospitalized for myasthenia, required intubation, or had any crisis.   UPDATE 12/14/2021:  She is here for 1 year visit.  Her overall health continues to be fair due to multiple medical comorbidities.  However, since getting COVID infection earlier in 2023, she has developed generalized weakness.  She uses a walker and wheelchair.  Husband is primary caregiver who assisted with bathing and dressing.    She continues to have mild right ptosis, which gets worse with prolonged reading.  No double vision, difficulty with swallowing/speech.    She has generalized polyarthralgia especially in the shoulders and hips.  She is followed by rheumatology and takes prednisone '5mg'$  daily for leukocytoclastic vasculitis.  She has recurrent umbilical hernia which is in operable.     Medications:  Current Outpatient Medications on File Prior to Visit  Medication Sig Dispense Refill   betamethasone dipropionate 0.05 % cream APPLY TOPICALLY TWICE A DAY 45 g 0   CALCIUM PO Take 1,000 mg by mouth at bedtime.     cholecalciferol (VITAMIN D) 1000 units tablet Take 1,000 Units by mouth at bedtime.  diazepam (VALIUM) 5 MG tablet TAKE 1 TABLET BY MOUTH EVERY 12 HOURS AS NEEDED FOR ANXIETY. 60 tablet 0   ELIQUIS 5 MG TABS tablet TAKE 1 TABLET BY MOUTH TWICE A DAY 180 tablet 1   ezetimibe (ZETIA) 10 MG  tablet Take 1 tablet (10 mg total) by mouth daily. 90 tablet 3   flecainide (TAMBOCOR) 150 MG tablet Take 1 tablet (150 mg total) by mouth as needed (for AFIB). 5 tablet 5   furosemide (LASIX) 40 MG tablet TAKE 1/2 TABLET BY MOUTH DAILY 45 tablet 1   losartan (COZAAR) 100 MG tablet TAKE 1 TABLET BY MOUTH EVERY DAY 90 tablet 1   Melatonin 10 MG CAPS Take 1 capsule by mouth at bedtime.     Multiple Vitamin (MULTIVITAMIN WITH MINERALS) TABS tablet Take 1 tablet by mouth at bedtime.     predniSONE (DELTASONE) 5 MG tablet TAKE 1 TABLET BY MOUTH EVERY DAY 30 tablet 0   pyridostigmine (MESTINON) 60 MG tablet Take 1 tablet (60 mg total) by mouth 3 (three) times daily. 270 tablet 3   pyridOXINE (VITAMIN B-6) 100 MG tablet Take 100 mg by mouth at bedtime.     spironolactone (ALDACTONE) 25 MG tablet TAKE 1 TABLET BY MOUTH EVERY DAY 90 tablet 2   vitamin B-12 (CYANOCOBALAMIN) 1000 MCG tablet Take 1,000 mcg by mouth at bedtime.     zaleplon (SONATA) 5 MG capsule TAKE 1 CAPSULE BY MOUTH AT BEDTIME AS NEEDED FOR SLEEP. 30 capsule 5   No current facility-administered medications on file prior to visit.    Allergies:  Allergies  Allergen Reactions   Azithromycin Other (See Comments)    Angioedema   Ibuprofen     800 mg - edema all over, including lips and tongue   Levaquin [Levofloxacin In D5w] Other (See Comments)    D/T MYASTHENIA GRAVIS   Tetracycline Rash   Isovue [Iopamidol] Dermatitis    Pt had a rash and blisters on bilat feet and lower part of legs.  Full premeds in the future.  This happened twice after IV contrast.   J Bohm  RTRCT   Sulfonamide Derivatives     Seizure age 81 (Note: probably fever induced)   Aleve [Naproxen] Other (See Comments)    Caused excessive salivation, throat tightness   Tape Other (See Comments)    Causes blisters  Paper tape only   Zosyn [Piperacillin Sod-Tazobactam So]     Severe leg rash, skin peeling   Amlodipine Besy-Benazepril Hcl Cough   Amoxicillin  Swelling and Rash    Has patient had a PCN reaction causing immediate rash, facial/tongue/throat swelling, SOB or lightheadedness with hypotension: No Has patient had a PCN reaction causing severe rash involving mucus membranes or skin necrosis: No Has patient had a PCN reaction that required hospitalization: No Has patient had a PCN reaction occurring within the last 10 years: Yes If all of the above answers are "NO", then may proceed with Cephalosporin use.    Vital Signs:  BP 132/75   Pulse 76   Ht '5\' 2"'$  (1.575 m)   Wt 297 lb (134.7 kg)   SpO2 96%   BMI 54.32 kg/m   Neurological Exam: MENTAL STATUS including orientation to time, place, person, recent and remote memory, attention span and concentration, language, and fund of knowledge is normal.  Speech is not dysarthric.  CRANIAL NERVES:   Pupils equal round and reactive to light.  Normal conjugate, extra-ocular eye movements in all directions of gaze.  Mild right ptosis without worsening with sustained upward gaze.  Normal facial muscles.  Face is symmetric. Palate elevates symmetrically.  Tongue strength is 5/5.  MOTOR:  Motor strength is 5-/5 throughout with limited ROM in the upper and lower extremities, proximally. No pronator drift.  Tone is normal.    MSRs:                                           Right        Left brachioradialis 3+  3+  biceps 3+  3+  triceps 3+  3+  patellar 3+  3+  ankle jerk 2+  2+   COORDINATION/GAIT:   Gait not tested, patient in a wheelchair.   Data: Labs 08/08/2014:  HbA1c 5.3 Labs 04/09/2014:  TSH 3.217, B12 1422 Labs 01/30/2010:  AChR binding, blocking, and modulating antibody - negative, ANA negative, MPO antibody negative Labs 11/29/2009:  AChR binding, blocking, and modulating antibody - negative, ANA negative, dsDNA neg, RPR neg  MRI brain wo contrast 11/28/2009: 1.  Large area of T1 signal abnormality of the right cerebellar hemisphere, involving the white matter.  This unusual  appearance is most suggestive of a demyelinating process such as multiple sclerosis, ADEM, or PML.  Given the patient's immunosuppression, PML should be excluded.  Vasculitis is of high likelihood given the patient known history.    2.  Chronic parenchymal brain volume loss  MRA/V wo contrast 11/28/2009: 1.  Normal MRV of the brain 2.  No definite abnormality is noted on MRA of the brain.  There is unusual right posterior circulation branching pattern with an apparent fetal origin right PCA and an accessory posterior cerebral artery with an attached patent posterior communicating artery.  No significant stenosis.   MRI brain wwo contrast 11/29/2009: 1.  Stable appearance of asymmetric increased T2 signal within the right cerebellum with an underlying mass lesion or abnormal enhancement identified.  This finding is indeterminate, but may present underlying demyelinating disease.   RNS with SFEMG performed at [facility] 01/09/2010:  Abnormal study.  These electrodiagnostic findings are consistent with a disorder of neuromuscular transmission as can be seen in myasthenia gravis.   Total time spent reviewing records, interview, history/exam, documentation, and coordination of care on day of encounter:  30 min    Thank you for allowing me to participate in patient's care.  If I can answer any additional questions, I would be pleased to do so.    Sincerely,    Ashar Lewinski K. Posey Pronto, DO

## 2021-12-31 ENCOUNTER — Other Ambulatory Visit: Payer: Self-pay | Admitting: Internal Medicine

## 2022-01-12 ENCOUNTER — Other Ambulatory Visit: Payer: Self-pay | Admitting: Internal Medicine

## 2022-02-04 ENCOUNTER — Other Ambulatory Visit: Payer: Self-pay | Admitting: Rheumatology

## 2022-02-05 NOTE — Telephone Encounter (Signed)
Next Visit: due July 2024, message sent to the front desk to schedule.   Last Visit: 08/06/2021  Last Fill: 12/01/2021  DX: Leukocytoclastic vasculitis   Current Dose per office note on 08/06/2021: Prednisone 5 mg 1 tablet by mouth daily.    Okay to refill prednisone?

## 2022-02-23 ENCOUNTER — Other Ambulatory Visit: Payer: Self-pay | Admitting: Internal Medicine

## 2022-03-01 ENCOUNTER — Other Ambulatory Visit: Payer: Self-pay | Admitting: Physician Assistant

## 2022-03-01 ENCOUNTER — Other Ambulatory Visit: Payer: Self-pay | Admitting: Internal Medicine

## 2022-03-01 NOTE — Telephone Encounter (Signed)
Next Visit: 08/05/2022   Last Visit: 08/06/2021   Last Fill: 02/05/2022   DX: Leukocytoclastic vasculitis    Current Dose per office note on 08/06/2021: Prednisone 5 mg 1 tablet by mouth daily.     Okay to refill prednisone?

## 2022-03-31 ENCOUNTER — Other Ambulatory Visit: Payer: Self-pay | Admitting: Physician Assistant

## 2022-03-31 NOTE — Telephone Encounter (Signed)
Last Fill: 03/01/2022  Next Visit: 08/05/2022  Last Visit: 08/06/2021  Dx:  Leukocytoclastic vasculitis   Current Dose per office note on 08/06/2021: Prednisone 5 mg 1 tablet by mouth daily   Okay to refill Prednisone?

## 2022-04-03 ENCOUNTER — Other Ambulatory Visit: Payer: Self-pay | Admitting: Internal Medicine

## 2022-04-03 ENCOUNTER — Other Ambulatory Visit: Payer: Self-pay | Admitting: Cardiovascular Disease

## 2022-04-03 DIAGNOSIS — I10 Essential (primary) hypertension: Secondary | ICD-10-CM

## 2022-04-05 NOTE — Telephone Encounter (Signed)
Rx request sent to pharmacy.  

## 2022-04-12 ENCOUNTER — Other Ambulatory Visit: Payer: Self-pay | Admitting: Internal Medicine

## 2022-04-29 ENCOUNTER — Other Ambulatory Visit (HOSPITAL_COMMUNITY): Payer: Self-pay | Admitting: Cardiovascular Disease

## 2022-04-29 DIAGNOSIS — I480 Paroxysmal atrial fibrillation: Secondary | ICD-10-CM

## 2022-04-29 NOTE — Telephone Encounter (Signed)
Please review for refill. Thank you! 

## 2022-04-29 NOTE — Telephone Encounter (Signed)
Eliquis  refill request received. Patient is 77 years old, weight-134.7kg, Crea-0.87 on 06/01/21, Diagnosis-afib, and last seen by Dr. Duke Salvia on 11/02/21. Dose is appropriate based on dosing criteria. Will send in refill to requested pharmacy.

## 2022-05-18 ENCOUNTER — Other Ambulatory Visit: Payer: Self-pay | Admitting: Internal Medicine

## 2022-06-22 ENCOUNTER — Other Ambulatory Visit: Payer: Self-pay | Admitting: Physician Assistant

## 2022-06-22 ENCOUNTER — Other Ambulatory Visit: Payer: Self-pay | Admitting: Internal Medicine

## 2022-06-22 NOTE — Telephone Encounter (Signed)
Last Fill: 03/31/2022   Next Visit: 08/05/2022   Last Visit: 08/06/2021   Dx:  Leukocytoclastic vasculitis    Current Dose per office note on 08/06/2021: Prednisone 5 mg 1 tablet by mouth daily    Okay to refill Prednisone?

## 2022-06-29 ENCOUNTER — Encounter: Payer: Self-pay | Admitting: Internal Medicine

## 2022-06-29 DIAGNOSIS — M159 Polyosteoarthritis, unspecified: Secondary | ICD-10-CM | POA: Insufficient documentation

## 2022-06-29 NOTE — Progress Notes (Unsigned)
Subjective:    Patient ID: Kimberly [name], female    DOB: 1945/12/04, 77 y.o.   MRN: [id number]     HPI Kailia is here for follow up of her chronic medical problems.  weakness feeling intermittently - no SOB, no Palps.  ? Afib attack -- usually can tell if it is an A-fib attack and this is not as bad, but feels similar.  She typically has these episodes a few times a week.  There is no pattern to when it occurs.  She has a hard time describing what she feels, but it is more like a weakness.   MG- looked good.  Neuro - had brisk reflexes-concern for some cervical canal stenosis.  She did recommend an MRI, but she did not want to have it done because she cannot have surgery and is not having any concerning symptoms at this time that would make her want to have an MRI  Has had increased anxiety.  Has had a lot of stresses recently in the last several months.  She does take the Valium in the evening or later in the day and that often helps.  She tries not to take it any other times because she wanted to continue to work and not get addicted to it.  Sleep is better with the Sonata.  She is getting 4-5 hours of sleep which is better than she has ever had in a long time.Marland Kitchen     Anxiety - rarely takes it inday -- takes it in evening or bedtime    Medications and allergies reviewed with patient and updated if appropriate.  Current Outpatient Medications on File Prior to Visit  Medication Sig Dispense Refill   CALCIUM PO Take 1,000 mg by mouth at bedtime.     cholecalciferol (VITAMIN D) 1000 units tablet Take 1,000 Units by mouth at bedtime.     diazepam (VALIUM) 5 MG tablet TAKE 1 TABLET BY MOUTH EVERY 12 HOURS AS NEEDED FOR ANXIETY 60 tablet 0   ELIQUIS 5 MG TABS tablet TAKE 1 TABLET BY MOUTH TWICE A DAY 180 tablet 1   ezetimibe (ZETIA) 10 MG tablet Take 1 tablet (10 mg total) by mouth daily. 90 tablet 3   furosemide (LASIX) 40 MG tablet TAKE 1/2 TABLET BY MOUTH DAILY 45 tablet 1    losartan (COZAAR) 100 MG tablet TAKE 1 TABLET BY MOUTH EVERY DAY Annual appt is due must see provider for future refills 30 tablet 0   Melatonin 10 MG CAPS Take 1 capsule by mouth at bedtime.     Multiple Vitamin (MULTIVITAMIN WITH MINERALS) TABS tablet Take 1 tablet by mouth at bedtime.     predniSONE (DELTASONE) 5 MG tablet TAKE 1 TABLET BY MOUTH EVERY DAY 30 tablet 2   pyridOXINE (VITAMIN B-6) 100 MG tablet Take 100 mg by mouth at bedtime.     spironolactone (ALDACTONE) 25 MG tablet TAKE 1 TABLET BY MOUTH EVERY DAY 90 tablet 2   vitamin B-12 (CYANOCOBALAMIN) 1000 MCG tablet Take 1,000 mcg by mouth at bedtime.     zaleplon (SONATA) 5 MG capsule TAKE 1 CAPSULE BY MOUTH AT BEDTIME AS NEEDED FOR SLEEP. 30 capsule 5   betamethasone dipropionate 0.05 % cream APPLY TOPICALLY TWICE A DAY (Patient not taking: Reported on 06/30/2022) 45 g 0   flecainide (TAMBOCOR) 150 MG tablet Take 1 tablet (150 mg total) by mouth as needed (for AFIB). (Patient not taking: Reported on 06/30/2022) 5 tablet 5  pyridostigmine (MESTINON) 60 MG tablet Take 1 tablet (60 mg total) by mouth 3 (three) times daily. (Patient not taking: Reported on 06/30/2022) 270 tablet 3   No current facility-administered medications on file prior to visit.     Review of Systems  Constitutional:  Positive for fatigue (gen weakness at times).  Respiratory:  Negative for cough, shortness of breath and wheezing.   Cardiovascular:  Positive for palpitations. Negative for chest pain and leg swelling.  Musculoskeletal:  Positive for arthralgias. Negative for back pain.  Neurological:  Positive for headaches (occ with stress). Negative for dizziness and light-headedness.       Objective:   Vitals:   06/30/22 1508  BP: 134/76  Pulse: 75  Temp: 98 F (36.7 C)  SpO2: 95%   BP Readings from Last 3 Encounters:  06/30/22 134/76  12/14/21 132/75  11/02/21 132/68   Wt Readings from Last 3 Encounters:  12/14/21 297 lb (134.7 kg)  11/02/21  297 lb (134.7 kg)  10/13/21 297 lb (134.7 kg)   Body mass index is 54.32 kg/m.    Physical Exam Constitutional:      General: She is not in acute distress.    Appearance: Normal appearance.  HENT:     Head: Normocephalic and atraumatic.  Eyes:     Conjunctiva/sclera: Conjunctivae normal.  Cardiovascular:     Rate and Rhythm: Normal rate and regular rhythm.     Heart sounds: Normal heart sounds.  Pulmonary:     Effort: Pulmonary effort is normal. No respiratory distress.     Breath sounds: Normal breath sounds. No wheezing.  Musculoskeletal:     Cervical back: Neck supple.     Right lower leg: No edema.     Left lower leg: No edema.  Lymphadenopathy:     Cervical: No cervical adenopathy.  Skin:    General: Skin is warm and dry.     Findings: No rash.  Neurological:     Mental Status: She is alert. Mental status is at baseline.     Comments: Difficult time checking reflexes in lower extremities because knees were resting on the floor  Psychiatric:        Mood and Affect: Mood normal.        Behavior: Behavior normal.        Lab Results  Component Value Date   WBC 12.2 (H) 06/01/2021   HGB 13.5 06/01/2021   HCT 40.9 06/01/2021   PLT 299.0 06/01/2021   GLUCOSE 98 06/01/2021   CHOL 200 06/01/2021   TRIG 93.0 06/01/2021   HDL 64.30 06/01/2021   LDLCALC 117 (H) 06/01/2021   ALT 22 06/01/2021   AST 29 06/01/2021   NA 137 06/01/2021   K 4.2 06/01/2021   CL 100 06/01/2021   CREATININE 0.87 06/01/2021   BUN 22 06/01/2021   CO2 26 06/01/2021   TSH 2.10 06/01/2021   INR 1.11 11/18/2016   HGBA1C 5.4 06/01/2021     Assessment & Plan:    See Problem List for Assessment and Plan of chronic medical problems.

## 2022-06-29 NOTE — Patient Instructions (Addendum)
      Blood work was ordered.   The lab is on the first floor.    Medications changes include :   none      Return in about 1 year (around 06/30/2023) for follow up.

## 2022-06-30 ENCOUNTER — Ambulatory Visit (INDEPENDENT_AMBULATORY_CARE_PROVIDER_SITE_OTHER): Payer: Medicare Other | Admitting: Internal Medicine

## 2022-06-30 VITALS — BP 134/76 | HR 75 | Temp 98.0°F | Ht 62.0 in

## 2022-06-30 DIAGNOSIS — I480 Paroxysmal atrial fibrillation: Secondary | ICD-10-CM | POA: Diagnosis not present

## 2022-06-30 DIAGNOSIS — R739 Hyperglycemia, unspecified: Secondary | ICD-10-CM

## 2022-06-30 DIAGNOSIS — E782 Mixed hyperlipidemia: Secondary | ICD-10-CM | POA: Diagnosis not present

## 2022-06-30 DIAGNOSIS — G479 Sleep disorder, unspecified: Secondary | ICD-10-CM

## 2022-06-30 DIAGNOSIS — M159 Polyosteoarthritis, unspecified: Secondary | ICD-10-CM

## 2022-06-30 DIAGNOSIS — M310 Hypersensitivity angiitis: Secondary | ICD-10-CM

## 2022-06-30 DIAGNOSIS — G7000 Myasthenia gravis without (acute) exacerbation: Secondary | ICD-10-CM | POA: Diagnosis not present

## 2022-06-30 DIAGNOSIS — I10 Essential (primary) hypertension: Secondary | ICD-10-CM

## 2022-06-30 DIAGNOSIS — E559 Vitamin D deficiency, unspecified: Secondary | ICD-10-CM | POA: Diagnosis not present

## 2022-06-30 DIAGNOSIS — F411 Generalized anxiety disorder: Secondary | ICD-10-CM | POA: Diagnosis not present

## 2022-06-30 LAB — COMPREHENSIVE METABOLIC PANEL
ALT: 21 U/L (ref 0–35)
AST: 26 U/L (ref 0–37)
Albumin: 4.4 g/dL (ref 3.5–5.2)
Alkaline Phosphatase: 62 U/L (ref 39–117)
BUN: 17 mg/dL (ref 6–23)
CO2: 29 mEq/L (ref 19–32)
Calcium: 9.9 mg/dL (ref 8.4–10.5)
Chloride: 99 mEq/L (ref 96–112)
Creatinine, Ser: 0.82 mg/dL (ref 0.40–1.20)
GFR: 69.8 mL/min (ref >60.00)
Glucose, Bld: 106 mg/dL — ABNORMAL HIGH (ref 70–99)
Potassium: 4.9 mEq/L (ref 3.5–5.1)
Sodium: 137 mEq/L (ref 135–145)
Total Bilirubin: 0.7 mg/dL (ref 0.2–1.2)
Total Protein: 7.8 g/dL (ref 6.0–8.3)

## 2022-06-30 LAB — CBC WITH DIFFERENTIAL/PLATELET
Basophils Absolute: 0 10*3/uL (ref 0.0–0.1)
Basophils Relative: 0.4 % (ref 0.0–3.0)
Eosinophils Absolute: 0 10*3/uL (ref 0.0–0.7)
Eosinophils Relative: 0.1 % (ref 0.0–5.0)
HCT: 43.3 % (ref 36.0–46.0)
Hemoglobin: 13.7 g/dL (ref 12.0–15.0)
Lymphocytes Relative: 8.2 % — ABNORMAL LOW (ref 12.0–46.0)
Lymphs Abs: 1 10*3/uL (ref 0.7–4.0)
MCHC: 31.7 g/dL (ref 30.0–36.0)
MCV: 92.2 fl (ref 78.0–100.0)
Monocytes Absolute: 0.8 10*3/uL (ref 0.1–1.0)
Monocytes Relative: 6.6 % (ref 3.0–12.0)
Neutro Abs: 10.5 10*3/uL — ABNORMAL HIGH (ref 1.4–7.7)
Neutrophils Relative %: 84.7 % — ABNORMAL HIGH (ref 43.0–77.0)
Platelets: 304 10*3/uL (ref 150.0–400.0)
RBC: 4.7 Mil/uL (ref 3.87–5.11)
RDW: 13.8 % (ref 11.5–15.5)
WBC: 12.4 10*3/uL — ABNORMAL HIGH (ref 4.0–10.5)

## 2022-06-30 LAB — LIPID PANEL
Cholesterol: 212 mg/dL — ABNORMAL HIGH (ref 0–200)
HDL: 63.9 mg/dL (ref >39.00)
LDL Cholesterol: 118 mg/dL — ABNORMAL HIGH (ref 0–99)
NonHDL: 148.48
Total CHOL/HDL Ratio: 3
Triglycerides: 152 mg/dL — ABNORMAL HIGH (ref 0.0–149.0)
VLDL: 30.4 mg/dL (ref 0.0–40.0)

## 2022-06-30 LAB — VITAMIN D 25 HYDROXY (VIT D DEFICIENCY, FRACTURES): VITD: 48.43 ng/mL (ref 30.00–100.00)

## 2022-06-30 LAB — HEMOGLOBIN A1C: Hgb A1c MFr Bld: 5.3 % (ref 4.6–6.5)

## 2022-06-30 LAB — TSH: TSH: 2.59 u[IU]/mL (ref 0.35–5.50)

## 2022-06-30 NOTE — Assessment & Plan Note (Addendum)
Chronic Paroxysmal On Eliquis 5 mg twice daily Takes flecainide 150 mg as needed-has not taken because of possible side effects that made her nervous Having episodes of weakness that are less severe than what she gets with her A-fib presents denies any palpitations or shortness of breath/lightheadedness with this episodes of weakness-concern for episodes of atrial fibrillation Has Kardia at home and will try to capture these events Deferred wearing a Holter monitor Sees cardio in August CBC, CMP, TSH

## 2022-06-30 NOTE — Assessment & Plan Note (Signed)
Chronic Blood pressure well controlled CMP Continue losartan 100 mg daily, spironolactone 25 mg daily 

## 2022-06-30 NOTE — Assessment & Plan Note (Addendum)
Chronic Increased anxiety recently-typically only taking diltiazem once a day, but on rare occasion will take it twice a day She does not really want to add any medication-discussed options-can try BuSpar in addition to the diltiazem for him try adding a low-dose SSRI Continue diazepam 5 mg every 12 hours as needed

## 2022-06-30 NOTE — Assessment & Plan Note (Signed)
Chronic Following with rheumatology - Dr Corliss Skains

## 2022-06-30 NOTE — Assessment & Plan Note (Signed)
Chronic Check a1c Low sugar / carb diet Stressed regular exercise  

## 2022-06-30 NOTE — Assessment & Plan Note (Addendum)
Chronic Regular exercise and healthy diet encouraged Check lipid panel  Started on Zetia 10 mg daily

## 2022-06-30 NOTE — Assessment & Plan Note (Addendum)
Chronic Has difficulty with sleeping Sleep is interrupted and she has difficulty falling asleep and getting back to sleep when she wakes up Sleep improved with taking the diltiazem in the evening which helps her relax and then the sleep medication at night Getting 4-5 hours of sleep at night which is very good for her Sonata 5 mg nightly as needed

## 2022-06-30 NOTE — Assessment & Plan Note (Signed)
Chronic Taking vitamin D daily Check vitamin D level  

## 2022-06-30 NOTE — Assessment & Plan Note (Signed)
Chronic Following with rheumatology On low-dose prednisone 5 mg daily

## 2022-06-30 NOTE — Assessment & Plan Note (Addendum)
Chronic Management per neurology-Dr. Allena Katz Controlled On prednisone 5 mg daily

## 2022-07-09 ENCOUNTER — Telehealth: Payer: Self-pay | Admitting: Internal Medicine

## 2022-07-09 NOTE — Telephone Encounter (Signed)
Called pt she states she does not used Clinical cytogeneticist. Read MD results from labs on 06/30/22.Marland KitchenRaechel Chute   Your cholesterol is about the same.  Ideally your LDL should be less than 70-Dr. Duke Salvia will talk to you about other alternatives for your cholesterol.  Your white blood cell count is slightly elevated, but stable.  Your other blood counts, kidney function and liver tests are normal.  Your sugars have been in the normal range.  Your vitamin D level is very good.  Your thyroid function is normal

## 2022-07-09 NOTE — Telephone Encounter (Signed)
Patient would like a call back to go over recent lab results. Best callback is [phone].

## 2022-07-09 NOTE — Telephone Encounter (Signed)
Prescription Request  07/09/2022  LOV: 06/30/2022  What is the name of the medication or equipment? diazepam (VALIUM) 5 MG tablet  zaleplon (SONATA) 5 MG capsule  Have you contacted your pharmacy to request a refill? Yes   Which pharmacy would you like this sent to?  [id number] - [place], [place] - [address] AT [address] OF [facility] [address] [place] [address] Phone: [phone] Fax: [phone]    Patient notified that their request is being sent to the clinical staff for review and that they should receive a response within 2 business days.   Please advise at [facility] [phone]

## 2022-07-10 MED ORDER — DIAZEPAM 5 MG PO TABS: 5.0000 mg | ORAL_TABLET | Freq: Two times a day (BID) | ORAL | 5 refills | Status: DC | PRN

## 2022-07-13 ENCOUNTER — Other Ambulatory Visit: Payer: Self-pay | Admitting: Internal Medicine

## 2022-07-14 ENCOUNTER — Telehealth: Payer: Self-pay

## 2022-07-14 NOTE — Telephone Encounter (Signed)
*  Internal Med  PA request received for diazePAM 5MG  tablets  PA submitted to [facility] Medicare via CMM and is pending additional questions/determination  Key: [id number]

## 2022-07-15 ENCOUNTER — Other Ambulatory Visit (HOSPITAL_COMMUNITY): Payer: Self-pay

## 2022-07-15 NOTE — Telephone Encounter (Signed)
Patient Advocate Encounter  Prior Authorization for Diazepam 5mg  has been approved with [employer].    PA#  [id number] Effective dates: 07/14/22 through 07/14/23  Per [facility] test claim, copay for 30 days supply is $6  Approval letter indexed to chart

## 2022-07-22 NOTE — Progress Notes (Signed)
Office Visit Note  Patient: Kimberly [name]             Date of Birth: [birth year]           MRN: [id number]             PCP: Pincus Sanes, MD Referring: Pincus Sanes, MD Visit Date: 08/05/2022 Occupation: @GUAROCC @  Subjective:  Pain of the Right Shoulder, Pain of the Left Shoulder   History of Present Illness: Kimberly [name] is a 77 y.o. female leukocytoclastic vasculitis and degenerative disc disease.  She returns today after her last visit on August 06, 2021.  She continues to take prednisone 5 mg p.o. daily without any interruption.  She tried to taper prednisone in the past and had recurrence of the rash.  She has been having pain and discomfort in her bilateral shoulders.  She states she is having difficulty supinating her hand so she has been using a larger spoon.  She continues to have pain and discomfort over bilateral thumb base in her middle fingers.  She continues to have pain and discomfort in her knee joints.  He describes discomfort on the top of her feet.  She denies any joint swelling.    Activities of Daily Living:  Patient reports morning stiffness for 24 hours.   Patient Reports nocturnal pain.  Difficulty dressing/grooming: Reports Difficulty climbing stairs: Reports Difficulty getting out of chair: Reports Difficulty using hands for taps, buttons, cutlery, and/or writing: Reports  Review of Systems  Constitutional:  Positive for fatigue.  HENT:  Negative for mouth sores and mouth dryness.   Eyes:  Positive for dryness.  Respiratory:  Negative for shortness of breath.   Cardiovascular:  Positive for palpitations. Negative for chest pain.  Gastrointestinal:  Positive for diarrhea. Negative for blood in stool and constipation.  Endocrine: Negative for increased urination.  Genitourinary:  Negative for involuntary urination.  Musculoskeletal:  Positive for joint pain, gait problem, joint pain, myalgias, muscle weakness, morning stiffness, muscle  tenderness and myalgias. Negative for joint swelling.  Skin:  Positive for rash, hair loss and sensitivity to sunlight. Negative for color change.  Allergic/Immunologic: Negative for susceptible to infections.  Neurological:  Negative for dizziness and headaches.  Hematological:  Negative for swollen glands.  Psychiatric/Behavioral:  Positive for sleep disturbance. Negative for depressed mood. The patient is nervous/anxious.     PMFS History:  Patient Active Problem List   Diagnosis Date Noted   Generalized osteoarthritis 06/29/2022   Aortic atherosclerosis (HCC) 11/02/2021   Arthralgia 06/01/2021   Lower abdominal pain 06/01/2021   Retinal detachment, right 09/17/2020   GAD (generalized anxiety disorder) 06/30/2020   Nose disorder 04/14/2020   Diverticular stricture (HCC) 03/18/2017   Sleep difficulties 10/31/2016   Bilateral leg edema 08/20/2016   Recurrent UTI 08/15/2016   Psoriasis 04/13/2016   History of diverticulitis 04/13/2016   Osteopenia of multiple sites 04/13/2016   DJD (degenerative joint disease), cervical 04/07/2016   Primary osteoarthritis of both knees 04/07/2016   Paroxysmal atrial fibrillation (HCC) 05/12/2015   NSVT (nonsustained ventricular tachycardia) (HCC) 05/12/2015   Abnormal nuclear stress test - INTERMEDIATE RISK 04/14/2015   Coronary artery calcification seen on CAT scan 02/28/2015   Hyperglycemia 08/09/2014   Dyspnea 03/20/2013   Umbilical hernia 10/20/2012   Pleural effusion 05/10/2012   Myasthenia gravis without exacerbation (HCC) 02/12/2010   Vitamin D deficiency 06/25/2008   Hyperlipidemia 06/25/2008   Essential hypertension 06/25/2008   Leukocytoclastic vasculitis (HCC) 06/19/2007  ANA POSITIVE, HX OF 01/24/2007    Past Medical History:  Diagnosis Date   Allergy    Aortic atherosclerosis (HCC) 11/02/2021   Atrial tachycardia 05/12/2015   Cataract    Colovesical fistula    Diverticulitis    Diverticulosis    DJD (degenerative joint  disease), cervical    Frequent UTI    GERD (gastroesophageal reflux disease)    History of hiatal hernia    Small hernia   Hypertension    Internal hemorrhoids 01/21/2017   noted on colonopscopy   Leukocytoclastic vasculitis (HCC)    Myalgia and myositis, unspecified    Myasthenia gravis without exacerbation (HCC)    [facility], Dr Fidela Juneau   NSVT (nonsustained ventricular tachycardia) (HCC) 05/12/2015   Osteoarthritis    Both knees   Osteopenia    Other and unspecified hyperlipidemia    Periumbilical hernia    Pneumonia 04/2016   PONV (postoperative nausea and vomiting)    Psoriasis    plantar ;Dr Donzetta Starch   Sleep difficulties    Vasculitis (HCC)    Dr Corliss Skains   Vitamin D deficiency     Family History  Problem Relation Age of Onset   Heart attack Mother 54   Heart failure Mother    Stroke Paternal Grandmother        in 24s   Lung cancer Father    Cancer Father        lung   Hyperthyroidism Sister        Graves   Hypertension Sister    Diabetes Neg Hx    Colon cancer Neg Hx    Esophageal cancer Neg Hx    Stomach cancer Neg Hx    Rectal cancer Neg Hx    Past Surgical History:  Procedure Laterality Date   APPENDECTOMY     & exploratory for inflammation; Dr Daphine Deutscher   AV FISTULA REPAIR     CARDIAC CATHETERIZATION N/A 04/17/2015   Procedure: Left Heart Cath and Coronary Angiography;  Surgeon: Marykay Lex, MD;  Location: [facility] INVASIVE CV LAB;  Service: Cardiovascular;  Laterality: N/A;   CERVICAL FUSION     C5-6; Dr Venetia Maxon   CHOLECYSTECTOMY     for stones   COLONOSCOPY W/ POLYPECTOMY  01/21/2017   CYSTOSCOPY WITH STENT PLACEMENT Bilateral 03/18/2017   Procedure: CYSTOSCOPY WITH RETROGRADE AND BILATERAL URETERAL STENT PLACEMENT;  Surgeon: Crista Elliot, MD;  Location: [facility] ORS;  Service: Urology;  Laterality: Bilateral;   HERNIA REPAIR     INJECTION OF SILICONE OIL Right 09/16/2020   Procedure: INJECTION OF SILICONE OIL;  Surgeon: Carmela Rima, MD;  Location:  [facility] OR;  Service: Ophthalmology;  Laterality: Right;   IR RADIOLOGIST EVAL & MGMT  11/16/2016   IR RADIOLOGIST EVAL & MGMT  12/08/2016   IR RADIOLOGIST EVAL & MGMT  12/16/2016   LAMINECTOMY  2010   T1-2; Dr Daphine Deutscher PLANA VITRECTOMY Right 09/16/2020   Procedure: PARS PLANA VITRECTOMY WITH 25 GAUGE With LASER;  Surgeon: Carmela Rima, MD;  Location: [facility] OR;  Service: Ophthalmology;  Laterality: Right;   PARTIAL COLECTOMY N/A 03/18/2017   Procedure: OPEN SIGMOIDECTOMY;  Surgeon: Romie Levee, MD;  Location: [facility];  Service: General;  Laterality: N/A;   REPAIR OF COMPLEX TRACTION RETINAL DETACHMENT Right 09/16/2020   Procedure: REPAIR OF COMPLEX TRACTION RETINAL DETACHMENT;  Surgeon: Carmela Rima, MD;  Location: [facility] OR;  Service: Ophthalmology;  Laterality: Right;   SHOULDER SURGERY     Left &  Right   THORACENTESIS     for peripneumonic effusion   TOTAL ABDOMINAL HYSTERECTOMY W/ BILATERAL SALPINGOOPHORECTOMY     For Fibroids & endometriosis   TOTAL SHOULDER ARTHROPLASTY     UMBILICAL HERNIA REPAIR N/A 03/18/2017   Procedure: OPEN HERNIA REPAIR UMBILICAL ADULT;  Surgeon: Romie Levee, MD;  Location: [facility] ORS;  Service: General;  Laterality: N/A;  ERAs pathway   Social History   Social History Narrative   Lives with husband in a one story home.  Had one child that only lived for 14 hours.     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          Epworth Sleepiness Scale = 2 (as of 03/14/2015)      Right Handed    Immunization History  Administered Date(s) Administered   Influenza, High Dose Seasonal PF 09/11/2012, 11/01/2016, 11/24/2017   Influenza,inj,Quad PF,6+ Mos 10/12/2014   Influenza-Unspecified 10/11/2013, 10/24/2015   PFIZER(Purple Top)SARS-COV-2 Vaccination 11/04/2019, 12/02/2019, 07/31/2020   Pfizer Covid-19 Vaccine Bivalent Booster 42yrs & up 12/04/2019   Pneumococcal Conjugate-13 05/09/2015   Pneumococcal Polysaccharide-23 04/19/2013      Objective: Vital Signs: BP 127/78 (BP Location: Right Wrist, Patient Position: Sitting, Cuff Size: Normal)   Pulse 77   Resp 17   Ht 5\' 2"  (1.575 m)   BMI 54.32 kg/m    Physical Exam Vitals and nursing note reviewed.  Constitutional:      Appearance: She is well-developed.  HENT:     Head: Normocephalic and atraumatic.  Eyes:     Conjunctiva/sclera: Conjunctivae normal.  Cardiovascular:     Rate and Rhythm: Normal rate and regular rhythm.     Heart sounds: Normal heart sounds.  Pulmonary:     Effort: Pulmonary effort is normal.     Breath sounds: Normal breath sounds.  Abdominal:     General: Bowel sounds are normal.     Palpations: Abdomen is soft.  Musculoskeletal:     Cervical back: Normal range of motion.  Lymphadenopathy:     Cervical: No cervical adenopathy.  Skin:    General: Skin is warm and dry.     Capillary Refill: Capillary refill takes less than 2 seconds.  Neurological:     Mental Status: She is alert and oriented to person, place, and time.  Psychiatric:        Behavior: Behavior normal.      Musculoskeletal Exam: Patient remains seated in the wheelchair.  She good range of motion of the cervical spine without discomfort.  Shoulder joint abduction was limited to 30 degrees bilaterally.  She had limited forward flexion and also internal rotation.  She had difficulty pronating her right forearm.  PIP and DIP thickening was noted.  Right fifth PIP contracture was noted.  Hip joints could not be assessed in the seated position.  Knee joints were in good range of motion with discomfort.  No warmth swelling or effusion was noted.  There was no tenderness over ankles or MTPs.  Bilateral lower extremity edema was noted.  CDAI Exam: CDAI Score: -- Patient Global: --; Provider Global: -- Swollen: --; Tender: -- Joint Exam 08/05/2022   No joint exam has been documented for this visit   There is currently no information documented on the homunculus. Go to  the Rheumatology activity and complete the homunculus joint exam.  Investigation: No additional findings.  Imaging: No results found.  Recent Labs: Lab Results  Component Value Date  WBC 12.4 (H) 06/30/2022   HGB 13.7 06/30/2022   PLT 304.0 06/30/2022   NA 137 06/30/2022   K 4.9 06/30/2022   CL 99 06/30/2022   CO2 29 06/30/2022   GLUCOSE 106 (H) 06/30/2022   BUN 17 06/30/2022   CREATININE 0.82 06/30/2022   BILITOT 0.7 06/30/2022   ALKPHOS 62 06/30/2022   AST 26 06/30/2022   ALT 21 06/30/2022   PROT 7.8 06/30/2022   ALBUMIN 4.4 06/30/2022   CALCIUM 9.9 06/30/2022   GFRAA >60 03/22/2018  03/01/2022 vitamin D48.43, TSH normal  Speciality Comments: No specialty comments available.  Procedures:  No procedures performed Allergies: Azithromycin, Ibuprofen, Levaquin [levofloxacin in d5w], Tetracycline, Isovue [iopamidol], Sulfonamide derivatives, Aleve [naproxen], Tape, Zosyn [piperacillin sod-tazobactam so], Amlodipine besy-benazepril hcl, and Amoxicillin   Assessment / Plan:     Visit Diagnoses: Leukocytoclastic vasculitis (HCC)-patient denies having a flare of vasculitis.  She has been taking prednisone 5 mg p.o. daily without any interruption.  High risk medication use - Prednisone 5 mg 1 tablet by mouth daily.  Patient has been getting labs through her PCP.  The most recent labs were within normal limits except elevated WBC count due to prednisone use.  Primary osteoarthritis of both knees-she complains of discomfort in her knee joints.  No warmth swelling or effusion was noted.  H/O repair of rotator cuff-she is limited range of motion of bilateral shoulders.  She seen Dr. Merlyn Lot in the past.  Trochanteric bursitis, right hip-improved.  DDD (degenerative disc disease), cervical-she has limited lateral rotation without discomfort.  Psoriasis-she denies any recent flares.  Osteopenia of multiple sites - I do not have a DEXA scan in the system.  Patient does not wish to  have DEXA scan at this time.  History of vitamin D deficiency-vitamin D was 48.43 in February 2024.  Other medical problems are listed as follows:  History of hyperlipidemia  Myasthenia gravis without exacerbation (HCC) - followed by neurology.  History of diverticulitis  History of hypertension-blood pressure was elevated at 147/79.  Repeat blood pressure was 127/78.  Paroxysmal atrial fibrillation (HCC) - patient is on Eliquis .  She has been followed by cardiology.  Orders: No orders of the defined types were placed in this encounter.  No orders of the defined types were placed in this encounter.    Follow-Up Instructions: Return in about 1 year (around 08/05/2023) for Osteoarthritis, vasulitis.   Pollyann Savoy, MD  Note - This record has been created using Animal nutritionist.  Chart creation errors have been sought, but may not always  have been located. Such creation errors do not reflect on  the standard of medical care.

## 2022-08-05 ENCOUNTER — Encounter: Payer: Self-pay | Admitting: Rheumatology

## 2022-08-05 ENCOUNTER — Ambulatory Visit: Payer: Medicare Other | Attending: Rheumatology | Admitting: Rheumatology

## 2022-08-05 ENCOUNTER — Other Ambulatory Visit: Payer: Self-pay | Admitting: Internal Medicine

## 2022-08-05 VITALS — BP 127/78 | HR 77 | Resp 17 | Ht 62.0 in

## 2022-08-05 DIAGNOSIS — I480 Paroxysmal atrial fibrillation: Secondary | ICD-10-CM

## 2022-08-05 DIAGNOSIS — L409 Psoriasis, unspecified: Secondary | ICD-10-CM

## 2022-08-05 DIAGNOSIS — M170 Bilateral primary osteoarthritis of knee: Secondary | ICD-10-CM | POA: Diagnosis not present

## 2022-08-05 DIAGNOSIS — Z79899 Other long term (current) drug therapy: Secondary | ICD-10-CM | POA: Diagnosis not present

## 2022-08-05 DIAGNOSIS — M310 Hypersensitivity angiitis: Secondary | ICD-10-CM | POA: Diagnosis not present

## 2022-08-05 DIAGNOSIS — Z8719 Personal history of other diseases of the digestive system: Secondary | ICD-10-CM | POA: Diagnosis not present

## 2022-08-05 DIAGNOSIS — Z9889 Other specified postprocedural states: Secondary | ICD-10-CM | POA: Diagnosis not present

## 2022-08-05 DIAGNOSIS — M5030 Other cervical disc degeneration, unspecified cervical region: Secondary | ICD-10-CM

## 2022-08-05 DIAGNOSIS — G7000 Myasthenia gravis without (acute) exacerbation: Secondary | ICD-10-CM

## 2022-08-05 DIAGNOSIS — M7061 Trochanteric bursitis, right hip: Secondary | ICD-10-CM | POA: Diagnosis not present

## 2022-08-05 DIAGNOSIS — Z8639 Personal history of other endocrine, nutritional and metabolic disease: Secondary | ICD-10-CM | POA: Diagnosis not present

## 2022-08-05 DIAGNOSIS — M8589 Other specified disorders of bone density and structure, multiple sites: Secondary | ICD-10-CM

## 2022-08-05 DIAGNOSIS — Z8679 Personal history of other diseases of the circulatory system: Secondary | ICD-10-CM

## 2022-08-05 NOTE — Patient Instructions (Signed)
Heart Disease Prevention   Your inflammatory disease increases your risk of heart disease which includes heart attack, stroke, atrial fibrillation (irregular heartbeats), high blood pressure, heart failure and atherosclerosis (plaque in the arteries).  It is important to reduce your risk by:   Keep blood pressure, cholesterol, and blood sugar at healthy levels   Smoking Cessation   Maintain a healthy weight  BMI 20-25   Eat a healthy diet  Plenty of fresh fruit, vegetables, and whole grains  Limit saturated fats, foods high in sodium, and added sugars  DASH and Mediterranean diet   Increase physical activity  Recommend moderate physically activity for 150 minutes per week/ 30 minutes a day for five days a week These can be broken up into three separate ten-minute sessions during the day.   Reduce Stress  Meditation, slow breathing exercises, yoga, coloring books  Dental visits twice a year

## 2022-08-12 ENCOUNTER — Encounter (HOSPITAL_BASED_OUTPATIENT_CLINIC_OR_DEPARTMENT_OTHER): Payer: Self-pay | Admitting: Family

## 2022-08-12 ENCOUNTER — Other Ambulatory Visit (HOSPITAL_BASED_OUTPATIENT_CLINIC_OR_DEPARTMENT_OTHER): Payer: Self-pay

## 2022-08-12 ENCOUNTER — Ambulatory Visit (INDEPENDENT_AMBULATORY_CARE_PROVIDER_SITE_OTHER): Payer: Medicare Other | Admitting: Family

## 2022-08-12 VITALS — BP 107/67 | HR 74 | Ht 62.0 in | Wt 297.0 lb

## 2022-08-12 DIAGNOSIS — E785 Hyperlipidemia, unspecified: Secondary | ICD-10-CM | POA: Diagnosis not present

## 2022-08-12 DIAGNOSIS — I10 Essential (primary) hypertension: Secondary | ICD-10-CM | POA: Diagnosis not present

## 2022-08-12 DIAGNOSIS — I2510 Atherosclerotic heart disease of native coronary artery without angina pectoris: Secondary | ICD-10-CM

## 2022-08-12 DIAGNOSIS — I480 Paroxysmal atrial fibrillation: Secondary | ICD-10-CM | POA: Diagnosis not present

## 2022-08-12 MED ORDER — FLECAINIDE ACETATE 150 MG PO TABS
150.0000 mg | ORAL_TABLET | ORAL | 5 refills | Status: DC | PRN
Start: 2022-08-12 — End: 2023-08-26

## 2022-08-12 MED ORDER — APIXABAN 5 MG PO TABS
5.0000 mg | ORAL_TABLET | Freq: Two times a day (BID) | ORAL | 1 refills | Status: DC
Start: 2022-08-12 — End: 2023-02-28

## 2022-08-12 MED ORDER — SPIRONOLACTONE 25 MG PO TABS
25.0000 mg | ORAL_TABLET | Freq: Every day | ORAL | 2 refills | Status: DC
Start: 2022-08-12 — End: 2022-10-05

## 2022-08-12 NOTE — Patient Instructions (Signed)
Medication Instructions:  Your physician recommends that you continue on your current medications as directed. Please refer to the Current Medication list given to you today.  *If you need a refill on your cardiac medications before your next appointment, please call your pharmacy*   Follow-Up: At [facility], you and your health needs are our priority.  As part of our continuing mission to provide you with exceptional heart care, we have created designated Provider Care Teams.  These Care Teams include your primary Cardiologist (physician) and Advanced Practice Providers (APPs -  Physician Assistants and Nurse Practitioners) who all work together to provide you with the care you need, when you need it.  We recommend signing up for the patient portal called "MyChart".  Sign up information is provided on this After Visit Summary.  MyChart is used to connect with patients for Virtual Visits (Telemedicine).  Patients are able to view lab/test results, encounter notes, upcoming appointments, etc.  Non-urgent messages can be sent to your provider as well.   To learn more about what you can do with MyChart, go to [email or web address].    Your next appointment:   6 months with Dr. Duke Salvia

## 2022-08-12 NOTE — Progress Notes (Signed)
Cardiology Office Note:  .   Date:  08/12/2022  ID:  Kimberly [name], DOB 10-Jun-1945, MRN [id number] PCP: Pincus Sanes, MD  [facility] Providers Cardiologist:  Chilton Si, MD    History of Present Illness: .   Kimberly [name] is a 77 y.o. female with hx of PAF, HTN, coronary calcification on CT, myasthenia gravis, RLE lymphedema, recurrent R pleural effusion, HLD>   Seen 03/2015 due to asymptomatic coronary calcifications. Myoview 03/2015 concerning for inferior and apical ischemia. LHC 04/2015 with normal but tortuous coronary arteries. Due to palpitations, 30 day monitor with PAT and NSVT. No nodal blocking agent due to myasthenia gravis. Echo 05/2015 normal LVEF 60-65%, normal diastolic function.   Admitted 03/2017 for repair of colovesicular fistula. POD6 had atrial fibrillation. Converted to NSR with Amiodarone while admitted. Discharged on Eliquis. Subsequent 30 day monitor with no atrial fib. However, due to recurrence 03/2018 Eliquis was resumed.   Last seen 10/2021 recommended for Repatha due to lipids uncontrolled with statin intolerance. She did not wish to pursue injectable therapy and was started on Zetia.   Presents today for follow up. Since last seen 2 episodes of PAF. She has not yet taken her PRN Flecainide as nervous about taking new medication. Feels wiped out after episodes of PAF. PAF most often triggered by stress. She asks about her Zetia. Had neuropathy with statin previously. 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 Not so bothersome she wishes to stop.  No new dyspnea, chest pain, edema, orthopnea, PND.    ROS: Please see the history of present illness.    All other systems reviewed and are negative.   Studies Reviewed: .        Cardiac Studies & Procedures   CARDIAC CATHETERIZATION  CARDIAC CATHETERIZATION 04/17/2015  Narrative Images from  the original result were not included. 1. The left ventricular systolic function is normal. 2. Angiographically normal coronary arteries, but very tortuous   Angiographically no evidence of any significant lesions to explain the patient's abnormal stress test. There does appear to be mild calcification in the LAD distribution, but certainly not on the luminal aspect.  Plan:  TR band removal per protocol in short stay holding area.  Discharge home after bedrest  Continue home medications  Follow back up with Dr. Duke Salvia as scheduled    [facility], Piedad Climes, M.D., M.S. Interventional Cardiologist  Pager # [phone] Phone # [phone] [address] [place], [place] [address]  Findings Coronary Findings Diagnostic  Dominance: Right  Left Main . Vessel is large.  Left Anterior Descending The vessel is tortuous. Tapers to a relatively small caliber vessel at the apex.  First Diagonal Branch The vessel is moderate in size.  First Septal Branch The vessel is moderate in size.  Second Diagonal Branch The vessel is small in size.  Third Septal Branch The vessel is small in size.  Ramus Intermedius . Vessel is moderate in size. The vessel is tortuous. With a hairpin turn   Tapers to a small vessel. Courses and OM distribution  Left Circumflex . Vessel is large. Vessel is angiographically normal.  First Obtuse Marginal Branch The vessel is large in size.  Lateral First Obtuse Marginal Branch The vessel is small in size.  Second Obtuse Marginal Branch The vessel is small in size.  Right Coronary Artery . Vessel is large.  Right Posterior Descending  Artery The vessel is moderate in size.  Right Posterior Atrioventricular Artery The vessel is moderate in size.  Intervention  No interventions have been documented.   STRESS TESTS  MYOCARDIAL PERFUSION IMAGING 03/28/2015  Narrative  Nuclear stress EF: 71%.  The left ventricular ejection  fraction is hyperdynamic (>65%).  There was no ST segment deviation noted during stress.  This is an intermediate risk study.  Findings consistent with ischemia.  Technically difficult study due to increased subdiaphragmatic activity; intermediate risk with small, severe, reversible defects in the apical and inferior basal walls consistent with ischemia; EF 71 with normal wall motion.   ECHOCARDIOGRAM  ECHOCARDIOGRAM COMPLETE 03/24/2017  Narrative *[place]* *[facility]* [address] [place], [place] [address] [phone]  ------------------------------------------------------------------- Transthoracic Echocardiography  Patient:    Kennley, Schwandt MR #:       [id number] Study Date: 03/24/2017 Gender:     F Age:        71 Height:     157.5 cm Weight:     118.9 kg BSA:        2.36 m^2 Pt. Status: Room:       [address]    Romie Levee [id number] ATTENDING    Romie Levee [id number] PERFORMING   [facility], Inpatient SONOGRAPHER  Leta Jungling, RDCS ORDERING     Duke, Roe Rutherford REFERRING    Marcelino Duster  cc:  ------------------------------------------------------------------- LV EF: 55% -   60%  ------------------------------------------------------------------- Indications:      Atrial fibrillation - 427.31.  ------------------------------------------------------------------- History:   PMH:  Myasthenia Gravis. Recurrent Right Pleural Effusion.  Coronary artery disease.  Risk factors:  Dyslipidemia.  ------------------------------------------------------------------- Study Conclusions  - Left ventricle: The cavity size was normal. Wall thickness was increased in a pattern of mild LVH. Indeterminant diastolic function (atrial fibrillation). Systolic function was normal. The estimated ejection fraction was in the range of 55% to 60%. Although no diagnostic regional wall motion abnormality was identified, this possibility cannot  be completely excluded on the basis of this study. - Aortic valve: There was no stenosis. - Mitral valve: Mildly calcified annulus. There was no significant regurgitation. - Right ventricle: The cavity size was normal. Systolic function was normal. - Pulmonary arteries: No complete TR doppler jet so unable to estimate PA systolic pressure. - Systemic veins: IVC measured 2.0 cm with < 50% respirophasic variation, suggesting RA pressure 8 mmHg.  Impressions:  - Normal LV size with mild LV hypertrophy. EF 55-60%. Normal RV size and systolic function. No significant valvular abnormalities.  ------------------------------------------------------------------- Study data:  No prior study was available for comparison.  Study status:  Routine.  Procedure:  The patient reported no pain pre or post test. Transthoracic echocardiography. Image quality was poor. The study was technically difficult, as a result of restricted patient mobility.  Study completion:  There were no complications. Transthoracic echocardiography.  M-mode, complete 2D, spectral Doppler, and color Doppler.  Birthdate:  Patient birthdate: 1945-06-03.  Age:  Patient is 77 yr old.  Sex:  Gender: female.    BMI: 47.9 kg/m^2.  Blood pressure:     127/71  Patient status:  Inpatient.  Study date:  Study date: 03/24/2017. Study time: 12:27 PM.  Location:  ICU/CCU  -------------------------------------------------------------------  ------------------------------------------------------------------- Left ventricle:  The cavity size was normal. Wall thickness was increased in a pattern of mild LVH. Indeterminant diastolic function (atrial fibrillation). Systolic function was normal. The estimated ejection fraction was in the range of 55% to 60%.  Although no diagnostic regional wall motion abnormality was identified, this possibility cannot be completely excluded on the basis of this  study.  ------------------------------------------------------------------- Aortic valve:   Trileaflet; mildly calcified leaflets.  Doppler: There was no stenosis.   There was no regurgitation.  ------------------------------------------------------------------- Aorta:  Aortic root: The aortic root was normal in size. Ascending aorta: The ascending aorta was normal in size.  ------------------------------------------------------------------- Mitral valve:   Mildly calcified annulus.  Doppler:   There was no evidence for stenosis.   There was no significant regurgitation. Peak gradient (D): 3 mm Hg.  ------------------------------------------------------------------- Left atrium:  The atrium was normal in size.  ------------------------------------------------------------------- Right ventricle:  The cavity size was normal. Systolic function was normal.  ------------------------------------------------------------------- Pulmonic valve:    Structurally normal valve.   Cusp separation was normal.  Doppler:  Transvalvular velocity was within the normal range. There was no regurgitation.  ------------------------------------------------------------------- Tricuspid valve:   Doppler:  There was no significant regurgitation.  ------------------------------------------------------------------- Pulmonary artery:   No complete TR doppler jet so unable to estimate PA systolic pressure.  ------------------------------------------------------------------- Right atrium:  The atrium was normal in size.  ------------------------------------------------------------------- Pericardium:  There was no pericardial effusion.  ------------------------------------------------------------------- Systemic veins:  IVC measured 2.0 cm with < 50% respirophasic variation, suggesting RA pressure 8 mmHg.  ------------------------------------------------------------------- Measurements  Left  ventricle                           Value        Reference LV ID, ED, PLAX chordal        (L)       41.4  mm     43 - 52 LV ID, ES, PLAX chordal                  28.9  mm     23 - 38 LV fx shortening, PLAX chordal           30    %      >=29 LV PW thickness, ED                      12    mm     ---------- Stroke volume, 2D                        36    ml     ---------- Stroke volume/bsa, 2D                    15    ml/m^2 ---------- LV e&', lateral                           9.88  cm/s   ---------- LV E/e&', lateral                         9.24         ---------- LV e&', medial                            9.79  cm/s   ---------- LV E/e&', medial                          9.33         ---------- LV e&', average  9.84  cm/s   ---------- LV E/e&', average                         9.28         ----------  Ventricular septum                       Value        Reference IVS thickness, ED                        11.3  mm     ----------  LVOT                                     Value        Reference LVOT ID, S                               20    mm     ---------- LVOT area                                3.14  cm^2   ---------- LVOT peak velocity, S                    89.4  cm/s   ---------- LVOT mean velocity, S                    46.3  cm/s   ---------- LVOT VTI, S                              11.4  cm     ----------  Aorta                                    Value        Reference Aortic root ID, ED                       28    mm     ----------  Left atrium                              Value        Reference LA ID, A-P, ES                           35    mm     ---------- LA ID/bsa, A-P                           1.49  cm/m^2 <=2.2 LA volume, S                             47.7  ml     ---------- LA volume/bsa, S  20.3  ml/m^2 ---------- LA volume, ES, 1-p A4C                   42.3  ml     ---------- LA volume/bsa, ES, 1-p A4C               18     ml/m^2 ---------- LA volume, ES, 1-p A2C                   50.8  ml     ---------- LA volume/bsa, ES, 1-p A2C               21.6  ml/m^2 ----------  Mitral valve                             Value        Reference Mitral E-wave peak velocity              91.3  cm/s   ---------- Mitral deceleration time                 162   ms     150 - 230 Mitral peak gradient, D                  3     mm Hg  ----------  Right atrium                             Value        Reference RA ID, S-I, ES, A4C                      48.7  mm     34 - 49 RA area, ES, A4C                         13.1  cm^2   8.3 - 19.5 RA volume, ES, A/L                       29.6  ml     ---------- RA volume/bsa, ES, A/L                   12.6  ml/m^2 ----------  Right ventricle                          Value        Reference TAPSE                                    14.9  mm     ---------- RV s&', lateral, S                        12    cm/s   ----------  Legend: (L)  and  (H)  mark values outside specified reference range.  ------------------------------------------------------------------- Prepared and Electronically Authenticated by  Marca Ancona, M.D. 2019-03-14T15:35:45    MONITORS  CARDIAC EVENT MONITOR 05/25/2017  Narrative 30 Day Event Monitor  Quality: Fair.  Baseline artifact. Predominant rhythm: sinus rhythm. Average heart rate: 68 bpm  No arrhythmias noted.  Tiffany C. Duke Salvia, MD, [facility] 07/15/2017 9:57 PM  Risk Assessment/Calculations:    CHA2DS2-VASc Score =     This indicates a  % annual risk of stroke. The patient's score is based upon:             Physical Exam:   VS:  BP 107/67 (BP Location: Right Arm, Patient Position: Sitting, Cuff Size: Normal)   Pulse 74   Ht 5\' 2"  (1.575 m)   Wt 297 lb (134.7 kg)   BMI 54.32 kg/m    Wt Readings from Last 3 Encounters:  08/12/22 297 lb (134.7 kg)  12/14/21 297 lb (134.7 kg)  11/02/21 297 lb (134.7 kg)    GEN: Well nourished, well  developed in no acute distress NECK: No JVD; No carotid bruits CARDIAC: RRR, no murmurs, rubs, gallops RESPIRATORY:  Clear to auscultation without rales, wheezing or rhonchi  ABDOMEN: Soft, non-tender, non-distended EXTREMITIES:  No edema; No deformity   ASSESSMENT AND PLAN: .    Coronary calcification on CT / HLD, LDL goal <70 - Stable with no anginal symptoms. No indication for ischemic evaluation.  EKG today non acute. Intolerant to statin. Lipid panel 06/30/22 total cholesterol 212, triglycerides 152, HDL 63, LDL 118. Will reach out to pharmacy team to see if she is candidate for Leqvio, if not consider Repatha.   PAF / Hypercoagulable state - NSR by EKG. 2 episodes since last seen. Continue Flecainide PRN, she has not utilized and reassurance provided. CHA2DS2-VASc Score = 5 [CHF History: 0, HTN History: 1, Diabetes History: 0, Stroke History: 0, Vascular Disease History: 1, Age Score: 2, Gender Score: 1].  Therefore, the patient's annual risk of stroke is 7.2 %.    Continue Eliquis 5mg  BID - denies bleeding complications.   HTN - BP well controlled. Continue current antihypertensive regimen.  Refills provided.        Dispo: follow up 6 mos  Signed, Alver Sorrow, NP

## 2022-08-16 ENCOUNTER — Encounter (HOSPITAL_BASED_OUTPATIENT_CLINIC_OR_DEPARTMENT_OTHER): Payer: Self-pay | Admitting: Family

## 2022-08-17 ENCOUNTER — Telehealth (HOSPITAL_BASED_OUTPATIENT_CLINIC_OR_DEPARTMENT_OTHER): Payer: Self-pay | Admitting: Family

## 2022-08-17 NOTE — Telephone Encounter (Signed)
Called and reviewed lipid options with Miss Kimberly [name]. Statin intolerant with myalgia. Given myasthenia gravis recommend avoidance of statin. LDL not at goal <70 on Zetia despite lifestyle changes.   Per discussion with PharmD team, Leqvio likely affordable. Will ask pharmacy team to contact her when pricing available.   If it is cost prohibitive, she is agreeable to proceed with Repatha and will let us know.   Alver Sorrow, NP

## 2022-08-18 ENCOUNTER — Other Ambulatory Visit: Payer: Self-pay | Admitting: Internal Medicine

## 2022-08-18 DIAGNOSIS — I10 Essential (primary) hypertension: Secondary | ICD-10-CM

## 2022-08-18 NOTE — Telephone Encounter (Signed)
Benetta Spar will be coverd 100%. Medicare will cover 80% and [employer] supp will pick-up remaining 20%. Once you enter the therapy plan, we will schedule patient as soon as possible.  Thanks Selena Batten

## 2022-08-19 ENCOUNTER — Other Ambulatory Visit (HOSPITAL_BASED_OUTPATIENT_CLINIC_OR_DEPARTMENT_OTHER): Payer: Self-pay | Admitting: Family

## 2022-08-19 DIAGNOSIS — E785 Hyperlipidemia, unspecified: Secondary | ICD-10-CM

## 2022-08-19 NOTE — Telephone Encounter (Signed)
Reviewed Leqvio coverage.  She has already hit the donut hole, discussed if she elected to pursue Repatha with being more than the $45 per month. Reviewed that Wilber Bihari would be fully covered by insurance, not affect donut hole as not billed through Medicare D.  She wishes to think about it a couple days and will contact us with her decision.   Alver Sorrow, NP

## 2022-08-23 DIAGNOSIS — H43391 Other vitreous opacities, right eye: Secondary | ICD-10-CM | POA: Diagnosis not present

## 2022-08-23 DIAGNOSIS — H353131 Nonexudative age-related macular degeneration, bilateral, early dry stage: Secondary | ICD-10-CM | POA: Diagnosis not present

## 2022-08-23 DIAGNOSIS — H59811 Chorioretinal scars after surgery for detachment, right eye: Secondary | ICD-10-CM | POA: Diagnosis not present

## 2022-08-23 DIAGNOSIS — H35363 Drusen (degenerative) of macula, bilateral: Secondary | ICD-10-CM | POA: Diagnosis not present

## 2022-08-23 DIAGNOSIS — H338 Other retinal detachments: Secondary | ICD-10-CM | POA: Diagnosis not present

## 2022-08-23 DIAGNOSIS — H53141 Visual discomfort, right eye: Secondary | ICD-10-CM | POA: Diagnosis not present

## 2022-08-23 DIAGNOSIS — G43109 Migraine with aura, not intractable, without status migrainosus: Secondary | ICD-10-CM | POA: Diagnosis not present

## 2022-08-23 DIAGNOSIS — H35371 Puckering of macula, right eye: Secondary | ICD-10-CM | POA: Diagnosis not present

## 2022-09-09 ENCOUNTER — Ambulatory Visit (INDEPENDENT_AMBULATORY_CARE_PROVIDER_SITE_OTHER): Payer: Medicare Other

## 2022-09-09 VITALS — BP 126/55 | HR 75 | Temp 98.0°F | Resp 16 | Ht 62.0 in | Wt 297.0 lb

## 2022-09-09 DIAGNOSIS — E785 Hyperlipidemia, unspecified: Secondary | ICD-10-CM

## 2022-09-09 DIAGNOSIS — I2510 Atherosclerotic heart disease of native coronary artery without angina pectoris: Secondary | ICD-10-CM

## 2022-09-09 MED ORDER — INCLISIRAN SODIUM 284 MG/1.5ML ~~LOC~~ SOSY
284.0000 mg | PREFILLED_SYRINGE | Freq: Once | SUBCUTANEOUS | Status: AC
  Administered 2022-09-09: 284 mg via SUBCUTANEOUS
  Filled 2022-09-09: qty 1.5

## 2022-09-09 NOTE — Patient Instructions (Signed)
 Inclisiran Injection What is this medication? INCLISIRAN (in kli SIR an) treats high cholesterol. It works by decreasing bad cholesterol (such as LDL) in your blood. Changes to diet and exercise are often combined with this medication. This medicine may be used for other purposes; ask your health care provider or pharmacist if you have questions. COMMON BRAND NAME(S): LEQVIO What should I tell my care team before I take this medication? They need to know if you have any of these conditions: An unusual or allergic reaction to inclisiran, other medications, foods, dyes, or preservatives Pregnant or trying to get pregnant Breast-feeding How should I use this medication? This medication is injected under the skin. It is given by your care team in a hospital or clinic setting. Talk to your care team about the use of this medication in children. Special care may be needed. Overdosage: If you think you have taken too much of this medicine contact a poison control center or emergency room at once. NOTE: This medicine is only for you. Do not share this medicine with others. What if I miss a dose? Keep appointments for follow-up doses. It is important not to miss your dose. Call your care team if you are unable to keep an appointment. What may interact with this medication? Interactions are not expected. This list may not describe all possible interactions. Give your health care provider a list of all the medicines, herbs, non-prescription drugs, or dietary supplements you use. Also tell them if you smoke, drink alcohol, or use illegal drugs. Some items may interact with your medicine. What should I watch for while using this medication? Visit your care team for regular checks on your progress. Tell your care team if your symptoms do not start to get better or if they get worse. You may need blood work while you are taking this medication. What side effects may I notice from receiving this  medication? Side effects that you should report to your care team as soon as possible: Allergic reactions--skin rash, itching, hives, swelling of the face, lips, tongue, or throat Side effects that usually do not require medical attention (report these to your care team if they continue or are bothersome): Joint pain Pain, redness, or irritation at injection site This list may not describe all possible side effects. Call your doctor for medical advice about side effects. You may report side effects to FDA at 1-800-FDA-1088. Where should I keep my medication? This medication is given in a hospital or clinic. It will not be stored at home. NOTE: This sheet is a summary. It may not cover all possible information. If you have questions about this medicine, talk to your doctor, pharmacist, or health care provider.  2024 Elsevier/Gold Standard (2021-07-24 00:00:00)

## 2022-09-09 NOTE — Progress Notes (Signed)
Diagnosis: Hyperlipidemia  Provider:  Chilton Greathouse MD  Procedure: Injection   Leqvio (inclisiran), Dose: 284 mg, Site: subcutaneous, Number of injections: 1  Post Care: Observation period completed  Discharge: Condition: Good, Destination: Home . AVS Provided  Performed by:  Rico Ala, LPN

## 2022-09-10 ENCOUNTER — Telehealth: Payer: Self-pay | Admitting: Cardiovascular Disease

## 2022-09-10 NOTE — Telephone Encounter (Signed)
Advised patient of recommendations.  

## 2022-09-10 NOTE — Telephone Encounter (Signed)
Pt c/o medication issue:  1. Name of Medication:  Leqvio  2. How are you currently taking this medication (dosage and times per day)?   3. Are you having a reaction (difficulty breathing--STAT)?   4. What is your medication issue?   Patient would like to know if she needs to continue taking her other oral medications while on Leqvio.

## 2022-09-10 NOTE — Telephone Encounter (Signed)
She would continue Zetia while initializing Leqvio. When we do repeat labs after starting Leqvio we could consider discontinuing Zetia in the future.   Alver Sorrow, NP

## 2022-09-17 NOTE — Telephone Encounter (Signed)
Spoke with patient and she had Leqvio  8/29

## 2022-09-22 ENCOUNTER — Other Ambulatory Visit: Payer: Self-pay | Admitting: Physician Assistant

## 2022-09-22 NOTE — Telephone Encounter (Signed)
Last Fill: 06/22/2022  Next Visit: 08/04/2023  Last Visit: 08/04/2022  Dx: Leukocytoclastic vasculitis   Current Dose per office note on 08/05/2022: - Prednisone 5 mg 1 tablet by mouth daily.   Okay to refill Prednisone?

## 2022-10-04 ENCOUNTER — Other Ambulatory Visit: Payer: Self-pay | Admitting: Cardiovascular Disease

## 2022-10-04 DIAGNOSIS — I10 Essential (primary) hypertension: Secondary | ICD-10-CM

## 2022-10-21 ENCOUNTER — Ambulatory Visit: Payer: Medicare Other

## 2022-10-21 VITALS — Ht 62.0 in | Wt 297.0 lb

## 2022-10-21 DIAGNOSIS — Z0000 Encounter for general adult medical examination without abnormal findings: Secondary | ICD-10-CM

## 2022-10-21 NOTE — Progress Notes (Signed)
Subjective:   Kimberly [name] is a 77 y.o. female who presents for Medicare Annual (Subsequent) preventive examination.  Visit Complete: Virtual I connected with  TEEA DUCEY on 10/21/22 by a audio enabled telemedicine application and verified that I am speaking with the correct person using two identifiers.  Patient Location: Home  Provider Location: Office/Clinic  I discussed the limitations of evaluation and management by telemedicine. The patient expressed understanding and agreed to proceed.  Vital Signs: Because this visit was a virtual/telehealth visit, some criteria may be missing or patient reported. Any vitals not documented were not able to be obtained and vitals that have been documented are patient reported.  Cardiac Risk Factors include: advanced age (>65men, >63 women);dyslipidemia;family history of premature cardiovascular disease;hypertension;obesity (BMI >30kg/m2)     Objective:    Today's Vitals   10/21/22 1437 10/21/22 1438  Weight: 297 lb (134.7 kg)   Height: 5\' 2"  (1.575 m)   PainSc: 8  8   PainLoc: Generalized    Body mass index is 54.32 kg/m.     10/21/2022    2:41 PM 12/14/2021    1:51 PM 10/13/2021    9:43 AM 12/12/2020   10:46 AM 09/16/2020    5:07 PM 03/24/2017    2:00 PM 03/20/2017    7:00 AM  Advanced Directives  Does Patient Have a Medical Advance Directive? No No No No No  No  Would patient like information on creating a medical advance directive? No - Patient declined  No - Patient declined  No - Patient declined No - Patient declined     Current Medications (verified) Outpatient Encounter Medications as of 10/21/2022  Medication Sig   apixaban (ELIQUIS) 5 MG TABS tablet Take 1 tablet (5 mg total) by mouth 2 (two) times daily.   betamethasone dipropionate 0.05 % cream APPLY TOPICALLY TWICE A DAY (Patient not taking: Reported on 06/30/2022)   CALCIUM PO Take 1,000 mg by mouth at bedtime.   cholecalciferol (VITAMIN D) 1000 units  tablet Take 1,000 Units by mouth at bedtime.   diazepam (VALIUM) 5 MG tablet Take 1 tablet (5 mg total) by mouth every 12 (twelve) hours as needed. for anxiety   ezetimibe (ZETIA) 10 MG tablet Take 1 tablet (10 mg total) by mouth daily.   flecainide (TAMBOCOR) 150 MG tablet Take 1 tablet (150 mg total) by mouth as needed (for AFIB).   furosemide (LASIX) 40 MG tablet TAKE 1/2 TABLET BY MOUTH DAILY   inclisiran (LEQVIO) 284 MG/1.5ML SOSY injection Inject 284 mg into the skin once.   losartan (COZAAR) 100 MG tablet TAKE 1 TABLET BY MOUTH EVERY DAY *NEED APPOINTMENT FOR REFILLS*   Melatonin 10 MG CAPS Take 1 capsule by mouth at bedtime.   Multiple Vitamin (MULTIVITAMIN WITH MINERALS) TABS tablet Take 1 tablet by mouth at bedtime.   predniSONE (DELTASONE) 5 MG tablet TAKE 1 TABLET BY MOUTH EVERY DAY   pyridOXINE (VITAMIN B-6) 100 MG tablet Take 100 mg by mouth at bedtime.   spironolactone (ALDACTONE) 25 MG tablet TAKE 1 TABLET BY MOUTH EVERY DAY   vitamin B-12 (CYANOCOBALAMIN) 1000 MCG tablet Take 1,000 mcg by mouth at bedtime.   zaleplon (SONATA) 5 MG capsule TAKE 1 CAPSULE BY MOUTH AT BEDTIME AS NEEDED FOR SLEEP   No facility-administered encounter medications on file as of 10/21/2022.    Allergies (verified) Azithromycin, Ibuprofen, Levaquin [levofloxacin in d5w], Tetracycline, Isovue [iopamidol], Sulfonamide derivatives, Aleve [naproxen], Tape, Zosyn [piperacillin sod-tazobactam so], Amlodipine besy-benazepril hcl,  and Amoxicillin   History: Past Medical History:  Diagnosis Date   Allergy    Aortic atherosclerosis (HCC) 11/02/2021   Atrial tachycardia (HCC) 05/12/2015   Cataract    Colovesical fistula    Diverticulitis    Diverticulosis    DJD (degenerative joint disease), cervical    Frequent UTI    GERD (gastroesophageal reflux disease)    History of hiatal hernia    Small hernia   Hypertension    Internal hemorrhoids 01/21/2017   noted on colonopscopy   Leukocytoclastic  vasculitis (HCC)    Myalgia and myositis, unspecified    Myasthenia gravis without exacerbation (HCC)    [facility], Dr Fidela Juneau   NSVT (nonsustained ventricular tachycardia) (HCC) 05/12/2015   Osteoarthritis    Both knees   Osteopenia    Other and unspecified hyperlipidemia    Periumbilical hernia    Pneumonia 04/2016   PONV (postoperative nausea and vomiting)    Psoriasis    plantar ;Dr Donzetta Starch   Sleep difficulties    Vasculitis [facility])    Dr Corliss Skains   Vitamin D deficiency    Past Surgical History:  Procedure Laterality Date   APPENDECTOMY     & exploratory for inflammation; Dr Daphine Deutscher   AV FISTULA REPAIR     CARDIAC CATHETERIZATION N/A 04/17/2015   Procedure: Left Heart Cath and Coronary Angiography;  Surgeon: Marykay Lex, MD;  Location: [facility] INVASIVE CV LAB;  Service: Cardiovascular;  Laterality: N/A;   CERVICAL FUSION     C5-6; Dr Venetia Maxon   CHOLECYSTECTOMY     for stones   COLONOSCOPY W/ POLYPECTOMY  01/21/2017   CYSTOSCOPY WITH STENT PLACEMENT Bilateral 03/18/2017   Procedure: CYSTOSCOPY WITH RETROGRADE AND BILATERAL URETERAL STENT PLACEMENT;  Surgeon: Crista Elliot, MD;  Location: [facility] ORS;  Service: Urology;  Laterality: Bilateral;   HERNIA REPAIR     INJECTION OF SILICONE OIL Right 09/16/2020   Procedure: INJECTION OF SILICONE OIL;  Surgeon: Carmela Rima, MD;  Location: [facility] OR;  Service: Ophthalmology;  Laterality: Right;   IR RADIOLOGIST EVAL & MGMT  11/16/2016   IR RADIOLOGIST EVAL & MGMT  12/08/2016   IR RADIOLOGIST EVAL & MGMT  12/16/2016   LAMINECTOMY  2010   T1-2; Dr Daphine Deutscher PLANA VITRECTOMY Right 09/16/2020   Procedure: PARS PLANA VITRECTOMY WITH 25 GAUGE With LASER;  Surgeon: Carmela Rima, MD;  Location: [facility] OR;  Service: Ophthalmology;  Laterality: Right;   PARTIAL COLECTOMY N/A 03/18/2017   Procedure: OPEN SIGMOIDECTOMY;  Surgeon: Romie Levee, MD;  Location: [facility] ORS;  Service: General;  Laterality: N/A;   REPAIR OF COMPLEX TRACTION RETINAL  DETACHMENT Right 09/16/2020   Procedure: REPAIR OF COMPLEX TRACTION RETINAL DETACHMENT;  Surgeon: Carmela Rima, MD;  Location: [facility] OR;  Service: Ophthalmology;  Laterality: Right;   SHOULDER SURGERY     Left & Right   THORACENTESIS     for peripneumonic effusion   TOTAL ABDOMINAL HYSTERECTOMY W/ BILATERAL SALPINGOOPHORECTOMY     For Fibroids & endometriosis   TOTAL SHOULDER ARTHROPLASTY     UMBILICAL HERNIA REPAIR N/A 03/18/2017   Procedure: OPEN HERNIA REPAIR UMBILICAL ADULT;  Surgeon: Romie Levee, MD;  Location: [facility] ORS;  Service: General;  Laterality: N/A;  ERAs pathway   Family History  Problem Relation Age of Onset   Heart attack Mother 33   Heart failure Mother    Heart failure Father    Lung cancer Father    Cancer Father  lung   Hyperthyroidism Sister        Graves   Hypertension Sister    Stroke Paternal Grandmother        in 11s   Diabetes Neg Hx    Colon cancer Neg Hx    Esophageal cancer Neg Hx    Stomach cancer Neg Hx    Rectal cancer Neg Hx    Social History   Socioeconomic History   Marital status: Married    Spouse name: Not on file   Number of children: Not on file   Years of education: Not on file   Highest education level: Not on file  Occupational History   Occupation: NCS/EMG Technologist    Comment: Dr Neale Burly  Tobacco Use   Smoking status: Never    Passive exposure: Past   Smokeless tobacco: Never  Vaping Use   Vaping status: Never Used  Substance and Sexual Activity   Alcohol use: No    Alcohol/week: 0.0 standard drinks of alcohol   Drug use: No   Sexual activity: Not Currently  Other Topics Concern   Not on file  Social History Narrative   Lives with husband in a one story home.  Had one child that only lived for 14 hours.     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          Epworth Sleepiness Scale = 2 (as of 03/14/2015)      Right Handed    Social Determinants of Health   Financial  Resource Strain: Low Risk  (10/21/2022)   Overall Financial Resource Strain (CARDIA)    Difficulty of Paying Living Expenses: Not hard at all  Food Insecurity: No Food Insecurity (10/21/2022)   Hunger Vital Sign    Worried About Running Out of Food in the Last Year: Never true    Ran Out of Food in the Last Year: Never true  Transportation Needs: No Transportation Needs (10/21/2022)   PRAPARE - Administrator, Civil Service (Medical): No    Lack of Transportation (Non-Medical): No  Physical Activity: Sufficiently Active (10/21/2022)   Exercise Vital Sign    Days of Exercise per Week: 5 days    Minutes of Exercise per Session: 30 min  Stress: No Stress Concern Present (10/21/2022)   [employer] of Occupational Health - Occupational Stress Questionnaire    Feeling of Stress : Not at all  Social Connections: Socially Integrated (10/21/2022)   Social Connection and Isolation Panel [NHANES]    Frequency of Communication with Friends and Family: More than three times a week    Frequency of Social Gatherings with Friends and Family: More than three times a week    Attends Religious Services: More than 4 times per year    Active Member of [employer] or Organizations: Yes    Attends Engineer, structural: More than 4 times per year    Marital Status: Married    Tobacco Counseling Counseling given: Not Answered   Clinical Intake:  Pre-visit preparation completed: Yes  Pain : 0-10 Pain Score: 8  Pain Type: Other (Comment) Pain Location: Generalized     BMI - recorded: 54.32 Nutritional Status: BMI > 30  Obese Diabetes: No  How often do you need to have someone help you when you read instructions, pamphlets, or other written materials from your doctor or pharmacy?: 1 - Never What is the last grade level you completed in school?: EMG/[employer]  Interpreter Needed?: No  Information entered by :: Caroleena Paolini N Stanisha Lorenz, LPN.   Activities of Daily Living     10/21/2022    2:50 PM  In your present state of health, do you have any difficulty performing the following activities:  Hearing? 0  Vision? 0  Difficulty concentrating or making decisions? 0  Walking or climbing stairs? 1  Dressing or bathing? 0  Doing errands, shopping? 0  Preparing Food and eating ? N  Using the Toilet? N  In the past six months, have you accidently leaked urine? N  Do you have problems with loss of bowel control? N  Managing your Medications? N  Managing your Finances? N  Housekeeping or managing your Housekeeping? N    Patient Care Team: Pincus Sanes, MD as PCP - General (Internal Medicine) Chilton Si, MD as PCP - Cardiology (Cardiology) Glendale Chard, DO as Consulting Physician (Neurology) Szabat, Vinnie Level, [place] (Inactive) (Pharmacist) Glendale Chard, DO as Consulting Physician (Neurology)  Indicate any recent Medical Services you may have received from other than [facility] providers in the past year (date may be approximate).     Assessment:   This is a routine wellness examination for [place]ia.  Hearing/Vision screen Hearing Screening - Comments:: Patient denied any hearing difficulty.   No hearing aids.  Vision Screening - Comments:: Patient does wear otc readers. Detached retina. Eye exam done by: Dr. Allena Katz     Goals Addressed             This Visit's Progress    Manage My Cholesterol       Timeframe:  Long-Range Goal Priority:  High Start Date: 10/21/2022                            Expected End Date:                       Follow Up Date 10/21/2023    - change to whole grain breads, cereal, pasta - eat smaller or less servings of red meat - fill half the plate with nonstarchy vegetables - get blood test (fasting) done 1 week before next visit - increase the amount of fiber in food - read food labels for fat and fiber - switch to low-fat or skim milk    Why is this important?   Changing cholesterol starts with eating  heart-healthy foods.  Other steps may be to increase your activity and to quit if you smoke.    Notes:       Depression Screen    10/21/2022    2:47 PM 06/30/2022    3:13 PM 10/13/2021    9:46 AM 05/21/2016    1:04 PM 05/09/2015    8:27 AM  PHQ 2/9 Scores  PHQ - 2 Score 0 0 0 0 0  PHQ- 9 Score 2        Fall Risk    10/21/2022    2:44 PM 06/30/2022    3:13 PM 12/14/2021    1:51 PM 12/14/2021    1:50 PM 10/13/2021    9:45 AM  Fall Risk   Falls in the past year? 0 0 0 0 0  Number falls in past yr: 0 0 0 0 0  Injury with Fall? 0 0 0 0 0  Risk for fall due to : No Fall Risks No Fall Risks   No Fall Risks  Follow up Falls  prevention discussed Falls evaluation completed Falls evaluation completed Falls evaluation completed Falls prevention discussed    MEDICARE RISK AT HOME: Medicare Risk at Home Any stairs in or around the home?: Yes (basement) If so, are there any without handrails?: No Home free of loose throw rugs in walkways, pet beds, electrical cords, etc?: Yes Adequate lighting in your home to reduce risk of falls?: Yes Life alert?: No Use of a cane, walker or w/c?: Yes Grab bars in the bathroom?: Yes Shower chair or bench in shower?: Yes Elevated toilet seat or a handicapped toilet?: Yes  TIMED UP AND GO:  Was the test performed?  No    Cognitive Function:    10/21/2022    2:46 PM  MMSE - Mini Mental State Exam  Not completed: Unable to complete        10/21/2022    2:46 PM 10/13/2021    9:46 AM  6CIT Screen  What Year? 0 points 0 points  What month? 0 points 0 points  What time? 0 points 0 points  Count back from 20 0 points 0 points  Months in reverse 0 points 0 points  Repeat phrase 0 points 0 points  Total Score 0 points 0 points    Immunizations Immunization History  Administered Date(s) Administered   Influenza, High Dose Seasonal PF 09/11/2012, 11/01/2016, 11/24/2017   Influenza,inj,Quad PF,6+ Mos 10/12/2014   Influenza-Unspecified  10/11/2013, 10/24/2015   PFIZER(Purple Top)SARS-COV-2 Vaccination 11/04/2019, 12/02/2019, 07/31/2020   Pfizer Covid-19 Vaccine Bivalent Booster 79yrs & up 12/04/2019   Pneumococcal Conjugate-13 05/09/2015   Pneumococcal Polysaccharide-23 04/19/2013    TDAP status: Due, Education has been provided regarding the importance of this vaccine. Advised may receive this vaccine at local pharmacy or Health Dept. Aware to provide a copy of the vaccination record if obtained from local pharmacy or Health Dept. Verbalized acceptance and understanding.  Flu Vaccine status: Due, Education has been provided regarding the importance of this vaccine. Advised may receive this vaccine at local pharmacy or Health Dept. Aware to provide a copy of the vaccination record if obtained from local pharmacy or Health Dept. Verbalized acceptance and understanding.  Pneumococcal vaccine status: Up to date  Covid-19 vaccine status: Completed vaccines  Qualifies for Shingles Vaccine? Yes   Zostavax completed No   Shingrix Completed?: No.    Education has been provided regarding the importance of this vaccine. Patient has been advised to call insurance company to determine out of pocket expense if they have not yet received this vaccine. Advised may also receive vaccine at local pharmacy or Health Dept. Verbalized acceptance and understanding.  Screening Tests Health Maintenance  Topic Date Due   Hepatitis C Screening  Never done   DTaP/Tdap/Td (1 - Tdap) Never done   Zoster Vaccines- Shingrix (1 of 2) Never done   DEXA SCAN  Never done   INFLUENZA VACCINE  08/12/2022   COVID-19 Vaccine (4 - 2023-24 season) 11/06/2022 (Originally 09/12/2022)   Medicare Annual Wellness (AWV)  10/21/2023   Pneumonia Vaccine 89+ Years old  Completed   HPV VACCINES  Aged Out   Colonoscopy  Discontinued    Health Maintenance  Health Maintenance Due  Topic Date Due   Hepatitis C Screening  Never done   DTaP/Tdap/Td (1 - Tdap) Never  done   Zoster Vaccines- Shingrix (1 of 2) Never done   DEXA SCAN  Never done   INFLUENZA VACCINE  08/12/2022    Colorectal cancer screening: No longer required.   Mammogram  status: No longer required due to age/patient declined.  Bone Density status: Never done.  Lung Cancer Screening: (Low Dose CT Chest recommended if Age 39-80 years, 20 pack-year currently smoking OR have quit w/in 15years.) does not qualify.   Lung Cancer Screening Referral: no  Additional Screening:  Hepatitis C Screening: does qualify; Completed: No  Vision Screening: Recommended annual ophthalmology exams for early detection of glaucoma and other disorders of the eye. Is the patient up to date with their annual eye exam?  Yes  Who is the provider or what is the name of the office in which the patient attends annual eye exams? Dr. Allena Katz (Retinal Specialist) If pt is not established with a provider, would they like to be referred to a provider to establish care? No .   Dental Screening: Recommended annual dental exams for proper oral hygiene  Diabetic Foot Exam: N/A  Community Resource Referral / Chronic Care Management: CRR required this visit?  No   CCM required this visit?  No     Plan:     I have personally reviewed and noted the following in the patient's chart:   Medical and social history Use of alcohol, tobacco or illicit drugs  Current medications and supplements including opioid prescriptions. Patient is not currently taking opioid prescriptions. Functional ability and status Nutritional status Physical activity Advanced directives List of other physicians Hospitalizations, surgeries, and ER visits in previous 12 months Vitals Screenings to include cognitive, depression, and falls Referrals and appointments  In addition, I have reviewed and discussed with patient certain preventive protocols, quality metrics, and best practice recommendations. A written personalized care plan for  preventive services as well as general preventive health recommendations were provided to patient.     Mickeal Needy, LPN   40/10/2723   After Visit Summary: (Mail) Due to this being a telephonic visit, the after visit summary with patients personalized plan was offered to patient via mail   Nurse Notes: Normal cognitive status assessed by direct observation via telephone conversation by this Nurse Health Advisor. No abnormalities found.

## 2022-10-21 NOTE — Patient Instructions (Signed)
Ms. Delancey , Thank you for taking time to come for your Medicare Wellness Visit. I appreciate your ongoing commitment to your health goals. Please review the following plan we discussed and let me know if I can assist you in the future.   Referrals/Orders/Follow-Ups/Clinician Recommendations: No  This is a list of the screening recommended for you and due dates:  Health Maintenance  Topic Date Due   Hepatitis C Screening  Never done   DTaP/Tdap/Td vaccine (1 - Tdap) Never done   Zoster (Shingles) Vaccine (1 of 2) Never done   DEXA scan (bone density measurement)  Never done   Flu Shot  08/12/2022   COVID-19 Vaccine (4 - 2023-24 season) 11/06/2022*   Medicare Annual Wellness Visit  10/21/2023   Pneumonia Vaccine  Completed   HPV Vaccine  Aged Out   Colon Cancer Screening  Discontinued  *Topic was postponed. The date shown is not the original due date.    Advanced directives: (Declined) Advance directive discussed with you today. Even though you declined this today, please call our office should you change your mind, and we can give you the proper paperwork for you to fill out.  Next Medicare Annual Wellness Visit scheduled for next year: Yes

## 2022-11-19 ENCOUNTER — Other Ambulatory Visit: Payer: Self-pay | Admitting: Internal Medicine

## 2022-11-19 DIAGNOSIS — E785 Hyperlipidemia, unspecified: Secondary | ICD-10-CM

## 2022-11-19 DIAGNOSIS — E782 Mixed hyperlipidemia: Secondary | ICD-10-CM

## 2022-12-13 ENCOUNTER — Ambulatory Visit (INDEPENDENT_AMBULATORY_CARE_PROVIDER_SITE_OTHER): Payer: Medicare Other

## 2022-12-13 VITALS — BP 127/63 | HR 65 | Temp 97.9°F | Resp 18 | Ht 62.0 in

## 2022-12-13 DIAGNOSIS — I2510 Atherosclerotic heart disease of native coronary artery without angina pectoris: Secondary | ICD-10-CM

## 2022-12-13 DIAGNOSIS — E785 Hyperlipidemia, unspecified: Secondary | ICD-10-CM | POA: Diagnosis not present

## 2022-12-13 MED ORDER — INCLISIRAN SODIUM 284 MG/1.5ML ~~LOC~~ SOSY
284.0000 mg | PREFILLED_SYRINGE | Freq: Once | SUBCUTANEOUS | Status: AC
  Administered 2022-12-13: 284 mg via SUBCUTANEOUS
  Filled 2022-12-13: qty 1.5

## 2022-12-13 NOTE — Progress Notes (Signed)
Diagnosis: Hyperlipidemia  Provider:  Chilton Greathouse MD  Procedure: Injection  Leqvio (inclisiran), Dose: 284 mg, Site: subcutaneous, Number of injections: 1  Post Care: Observation period completed  Discharge: Condition: Good, Destination: Home . AVS Declined  Performed by:  Marlow Baars [place], RN

## 2022-12-19 ENCOUNTER — Other Ambulatory Visit: Payer: Self-pay | Admitting: Neurology

## 2022-12-20 ENCOUNTER — Ambulatory Visit (INDEPENDENT_AMBULATORY_CARE_PROVIDER_SITE_OTHER): Payer: Medicare Other | Admitting: Neurology

## 2022-12-20 ENCOUNTER — Encounter: Payer: Self-pay | Admitting: Neurology

## 2022-12-20 VITALS — BP 149/63 | HR 74 | Ht 62.0 in | Wt 297.0 lb

## 2022-12-20 DIAGNOSIS — G7000 Myasthenia gravis without (acute) exacerbation: Secondary | ICD-10-CM | POA: Diagnosis not present

## 2022-12-20 DIAGNOSIS — R292 Abnormal reflex: Secondary | ICD-10-CM | POA: Diagnosis not present

## 2022-12-20 MED ORDER — PYRIDOSTIGMINE BROMIDE 60 MG PO TABS: 60.0000 mg | ORAL_TABLET | Freq: Three times a day (TID) | ORAL | 3 refills | Status: DC

## 2022-12-20 NOTE — Progress Notes (Signed)
Follow-up Visit   Date: 12/20/22    Kimberly [name] MRN: [id number] DOB: [birth year]   Interim History: Kimberly [name] is a 77 y.o. right-handed Caucasian female with with osteoarthritis, leg edema, leukocytoclastic vasculitis (2010) being followed by Dr. Corliss Skains, thoracic T1-2 laminectomy (2011), and cervical fusion at C5-6 (2008), and paroxysmal atrial fibrillation returning to the clinic for follow-up of seronegative myasthenia gravis. She is retired Geologist, engineering.  IMPRESSION/PLAN: Seronegative myasthenia gravis (Dx 2011 by RNS and SFEMG at [facility]) without exacerbation.  Exam shows right ptosis, stable.  She has some weakness in the right leg with hip flexion, which was also present last year.   I do not think her spells of weakness are due to MG exacerbation, as I would expect progressive weakness if it was untreated.  I do not see any indication to increase her prednisone at this time.   - Continue prednisone 5mg  daily which she takes for leukocytoclastic vasculitis  - Continue mestinon 60mg  TID  2.  Spells of generalized weakness in the setting of hyperreflexia could suggest cervical canal stenosis. She has history of prior cervical fusion and may have adjacent disease.  MRI cervical spine was recommended, she will think about this.  She does not want any surgery, so it not sure the MRI will be helpful, but I explained that it would help to know whether she does have structural pathology contributing to her weakness.   2.  Right cerebellar lesion, less likely MS.  Two prior unsuccessful LPs and work-up at [facility].   Return to clinic in 1 year  ---------------------------------------------------------------- History of present illness: In 2011, patient started developing diplopia, blurred vision, ptosis, and gait abnormality for which she initially saw Dr. 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 She underwent MRI/A/V of the brain which showed T2  abnormality in the right cerebellar lesion so was referred her to Dr. Fidela Juneau at [facility]  who took over her care.  Per review of Dr. Mellody Dance notes, the lesion was thought to be nonspecific and likely incidental as it would not explain all her symptoms.  She had two unsuccessful attempts with lumbar puncture and because of low suspicion for MS, it was not pursued again.  In the meantime, she also underwent repetitive nerve stimulation with single fiber EMG which showed evidence of neuromuscular junction disorder.   AChR antibodies were negative. At the time of her myasthenia gravis diagnosis, patient was already taking prednisone (possibly 30mg ?) for vasculitis, so was started on mestinon 60mg  three times daily which helped her muscle strength and resolved her diplopia.  She remained on prednisone but during the winters of 2013 and 2014, she was was struggling with pneumonia with pleural effusion requiring thoracentesis.  Because of this, she was started on methotrexate in January 2015 and reports not having pneumonia this past winter.  She has never been hospitalized for myasthenia, required intubation, or had any crisis.   UPDATE 12/14/2021:  She is here for 1 year visit.  Her overall health continues to be fair due to multiple medical comorbidities.  However, since getting COVID infection earlier in 2023, she has developed generalized weakness.  She uses a walker and wheelchair.    She continues to have mild right ptosis, which gets worse with prolonged reading.  No double vision, difficulty with swallowing/speech.    She has generalized polyarthralgia especially in the shoulders and hips.  She is followed by rheumatology and takes prednisone 5mg  daily for leukocytoclastic  vasculitis.  She has recurrent umbilical hernia which is in operable.   UPDATE 12/20/2022: She is here for 1 year visit.  Since the spring 2024, she has been having spells of extreme weakness, where she needs help to raise to her legs. She  uses a walker at home.  She needs assistance from husband for bathing and dressing.  She also has neck weakness and feels that her symptoms are worse when her right eyelid is drooping more.  These spells last about 1 week and then improve. She endorses neck pain.    Medications:  Current Outpatient Medications on File Prior to Visit  Medication Sig Dispense Refill   apixaban (ELIQUIS) 5 MG TABS tablet Take 1 tablet (5 mg total) by mouth 2 (two) times daily. 180 tablet 1   betamethasone dipropionate 0.05 % cream APPLY TOPICALLY TWICE A DAY 45 g 0   CALCIUM PO Take 1,000 mg by mouth at bedtime.     cholecalciferol (VITAMIN D) 1000 units tablet Take 1,000 Units by mouth at bedtime.     diazepam (VALIUM) 5 MG tablet Take 1 tablet (5 mg total) by mouth every 12 (twelve) hours as needed. for anxiety 60 tablet 5   ezetimibe (ZETIA) 10 MG tablet TAKE 1 TABLET BY MOUTH EVERY DAY 90 tablet 3   flecainide (TAMBOCOR) 150 MG tablet Take 1 tablet (150 mg total) by mouth as needed (for AFIB). 5 tablet 5   furosemide (LASIX) 40 MG tablet TAKE 1/2 TABLET BY MOUTH DAILY 45 tablet 1   inclisiran (LEQVIO) 284 MG/1.5ML SOSY injection Inject 284 mg into the skin once.     losartan (COZAAR) 100 MG tablet TAKE 1 TABLET BY MOUTH EVERY DAY *NEED APPOINTMENT FOR REFILLS* 90 tablet 1   Melatonin 10 MG CAPS Take 1 capsule by mouth at bedtime.     Multiple Vitamin (MULTIVITAMIN WITH MINERALS) TABS tablet Take 1 tablet by mouth at bedtime.     predniSONE (DELTASONE) 5 MG tablet TAKE 1 TABLET BY MOUTH EVERY DAY 30 tablet 2   pyridOXINE (VITAMIN B-6) 100 MG tablet Take 100 mg by mouth at bedtime.     spironolactone (ALDACTONE) 25 MG tablet TAKE 1 TABLET BY MOUTH EVERY DAY 90 tablet 2   vitamin B-12 (CYANOCOBALAMIN) 1000 MCG tablet Take 1,000 mcg by mouth at bedtime.     zaleplon (SONATA) 5 MG capsule TAKE 1 CAPSULE BY MOUTH AT BEDTIME AS NEEDED FOR SLEEP 30 capsule 5   No current facility-administered medications on file  prior to visit.    Allergies:  Allergies  Allergen Reactions   Azithromycin Other (See Comments)    Angioedema   Ibuprofen     800 mg - edema all over, including lips and tongue   Levaquin [Levofloxacin In D5w] Other (See Comments)    D/T MYASTHENIA GRAVIS   Tetracycline Rash   Isovue [Iopamidol] Dermatitis    Pt had a rash and blisters on bilat feet and lower part of legs.  Full premeds in the future.  This happened twice after IV contrast.   J Bohm  RTRCT   Sulfonamide Derivatives     Seizure age 42 (Note: probably fever induced)   Aleve [Naproxen] Other (See Comments)    Caused excessive salivation, throat tightness   Tape Other (See Comments)    Causes blisters  Paper tape only   Zosyn [Piperacillin Sod-Tazobactam So]     Severe leg rash, skin peeling   Amlodipine Besy-Benazepril Hcl Cough   Amoxicillin Swelling  and Rash    Has patient had a PCN reaction causing immediate rash, facial/tongue/throat swelling, SOB or lightheadedness with hypotension: No Has patient had a PCN reaction causing severe rash involving mucus membranes or skin necrosis: No Has patient had a PCN reaction that required hospitalization: No Has patient had a PCN reaction occurring within the last 10 years: Yes If all of the above answers are "NO", then may proceed with Cephalosporin use.    Vital Signs:  BP (!) 149/63   Pulse 74   Ht 5\' 2"  (1.575 m)   Wt 297 lb (134.7 kg)   SpO2 96%   BMI 54.32 kg/m   Neurological Exam: MENTAL STATUS including orientation to time, place, person, recent and remote memory, attention span and concentration, language, and fund of knowledge is normal.  Speech is not dysarthric.  CRANIAL NERVES:   Pupils equal round and reactive to light.  Normal conjugate, extra-ocular eye movements in all directions of gaze.  Mild right ptosis without worsening with sustained upward gaze (stable).  Normal facial muscles.  Face is symmetric. Palate elevates symmetrically.  Tongue  strength is 5/5.  MOTOR:  Motor strength is 5-/5 throughout with limited ROM in the upper and lower extremities, proximally, worse in the right leg with hip flexion. Neck flexion is 5/5.  No pronator drift.  Tone is normal.    MSRs:                                           Right        Left brachioradialis 3+  3+  biceps 3+  3+  triceps 3+  3+  patellar 3+  3+  ankle jerk 2+  2+   COORDINATION/GAIT:   Gait not tested, patient in a wheelchair.   Data: Labs 08/08/2014:  HbA1c 5.3 Labs 04/09/2014:  TSH 3.217, B12 1422 Labs 01/30/2010:  AChR binding, blocking, and modulating antibody - negative, ANA negative, MPO antibody negative Labs 11/29/2009:  AChR binding, blocking, and modulating antibody - negative, ANA negative, dsDNA neg, RPR neg  MRI brain wo contrast 11/28/2009: 1.  Large area of T1 signal abnormality of the right cerebellar hemisphere, involving the white matter.  This unusual appearance is most suggestive of a demyelinating process such as multiple sclerosis, ADEM, or PML.  Given the patient's immunosuppression, PML should be excluded.  Vasculitis is of high likelihood given the patient known history.    2.  Chronic parenchymal brain volume loss  MRA/V wo contrast 11/28/2009: 1.  Normal MRV of the brain 2.  No definite abnormality is noted on MRA of the brain.  There is unusual right posterior circulation branching pattern with an apparent fetal origin right PCA and an accessory posterior cerebral artery with an attached patent posterior communicating artery.  No significant stenosis.   MRI brain wwo contrast 11/29/2009: 1.  Stable appearance of asymmetric increased T2 signal within the right cerebellum with an underlying mass lesion or abnormal enhancement identified.  This finding is indeterminate, but may present underlying demyelinating disease.   RNS with SFEMG performed at [facility] 01/09/2010:  Abnormal study.  These electrodiagnostic findings are consistent with a disorder  of neuromuscular transmission as can be seen in myasthenia gravis.   Total time spent reviewing records, interview, history/exam, documentation, and coordination of care on day of encounter:  30 min  Thank you for allowing me to  participate in patient's care.  If I can answer any additional questions, I would be pleased to do so.    Sincerely,    Brigit Doke K. Allena Katz, DO

## 2022-12-22 ENCOUNTER — Other Ambulatory Visit: Payer: Self-pay | Admitting: Physician Assistant

## 2022-12-22 NOTE — Telephone Encounter (Signed)
Last Fill: 09/22/2022   Next Visit: 08/04/2023   Last Visit: 08/04/2022   Dx: Leukocytoclastic vasculitis    Current Dose per office note on 08/05/2022: - Prednisone 5 mg 1 tablet by mouth daily.    Okay to refill Prednisone?

## 2023-01-07 ENCOUNTER — Other Ambulatory Visit: Payer: Self-pay | Admitting: Internal Medicine

## 2023-01-29 ENCOUNTER — Other Ambulatory Visit: Payer: Self-pay | Admitting: Internal Medicine

## 2023-02-17 ENCOUNTER — Other Ambulatory Visit: Payer: Self-pay | Admitting: Internal Medicine

## 2023-02-17 DIAGNOSIS — I10 Essential (primary) hypertension: Secondary | ICD-10-CM

## 2023-02-21 ENCOUNTER — Encounter: Payer: Self-pay | Admitting: Family

## 2023-02-21 ENCOUNTER — Encounter (HOSPITAL_BASED_OUTPATIENT_CLINIC_OR_DEPARTMENT_OTHER): Payer: Self-pay | Admitting: Cardiovascular Disease

## 2023-02-21 ENCOUNTER — Ambulatory Visit (HOSPITAL_BASED_OUTPATIENT_CLINIC_OR_DEPARTMENT_OTHER): Payer: Medicare Other | Admitting: Cardiovascular Disease

## 2023-02-21 ENCOUNTER — Other Ambulatory Visit (HOSPITAL_BASED_OUTPATIENT_CLINIC_OR_DEPARTMENT_OTHER): Payer: Self-pay

## 2023-02-21 VITALS — BP 143/76 | HR 68 | Ht 62.0 in | Wt 297.0 lb

## 2023-02-21 DIAGNOSIS — E782 Mixed hyperlipidemia: Secondary | ICD-10-CM

## 2023-02-21 DIAGNOSIS — I700 Atherosclerosis of aorta: Secondary | ICD-10-CM

## 2023-02-21 DIAGNOSIS — Z789 Other specified health status: Secondary | ICD-10-CM | POA: Diagnosis not present

## 2023-02-21 DIAGNOSIS — I10 Essential (primary) hypertension: Secondary | ICD-10-CM

## 2023-02-21 DIAGNOSIS — I480 Paroxysmal atrial fibrillation: Secondary | ICD-10-CM | POA: Diagnosis not present

## 2023-02-21 DIAGNOSIS — I4729 Other ventricular tachycardia: Secondary | ICD-10-CM | POA: Diagnosis not present

## 2023-02-21 DIAGNOSIS — Z23 Encounter for immunization: Secondary | ICD-10-CM | POA: Diagnosis not present

## 2023-02-21 HISTORY — DX: Other specified health status: Z78.9

## 2023-02-21 MED ORDER — FLUAD 0.5 ML IM SUSY
0.5000 mL | PREFILLED_SYRINGE | Freq: Once | INTRAMUSCULAR | 0 refills | Status: AC
  Filled 2023-02-21: qty 0.5, 1d supply, fill #0

## 2023-02-21 MED ORDER — COLESTIPOL HCL 1 G PO TABS: ORAL_TABLET | ORAL | 3 refills | Status: DC

## 2023-02-21 NOTE — Progress Notes (Signed)
 Cardiology Office Note:  .   Date:  03/16/2023  ID:  Kimberly [name], DOB October 26, 1945, MRN [id number] PCP: Pincus Sanes, MD  [facility] Providers Cardiologist:  Chilton Si, MD    History of Present Illness: .    Kimberly [name] is a 78 y.o. female with paroxysmal atrial fibrillation, hypertension, coronary artery calcification, Myasthenia Gravis, R LE lymphedema, recurrent R pleural effusion, hyperlipidemia who presents for follow up. She was initially seen 03/2015 for evaluation of asymptomatic coronary calcifications.  She had a chest CT to follow up on pleural effusions and was noted to have coronary calcification. She was asymptomatic but did not exercise much due to myasthenia.  She had a [employer] 03/2015 that was concerning for inferior and apical ischemia.  She subsequently underwent left heart catheterization 04/2015 that showed normal, but tortuous coronary arteries.  She reported palpitations and had a 30 day event monitor that showed paroxysmal atrial tachycardia and NSVT. She was not started on any nodal agents due to her history of myasthenia.  She had an echo 05/2015 that revealed LVEF 60-65% with normal diastolic function.   Kimberly [name] was admitted 03/2017 for repair of a colovesicular fistula.  On postoperative day 6 she developed atrial fibrillation with rapid ventricular response.  She received 1 dose of IV metoprolol but was switched to amiodarone given her myasthenia.  She converted back to sinus rhythm on amiodarone and was started on Eliquis.  She followed up with Kimberly [name], Kimberly [name], 03/2017 and reported dizziness and nausea that was thought to be due to the amiodarone.  Given that her atrial for relation was thought to be postoperative amiodarone was decreased with plans to discontinue it.  She did not have any recurrent atrial fibrillation after that time.  She again wore a 30-day event monitor that was without atrial fibrillation. She had an episode of afib  03/2018 and was seen in the atrial fibrillation clinic.  She was restarted on Eliquis. She was started on flecainide to be taken as needed.     Kimberly [name] has struggled with statin intolerance.  She was started on Inclisiran.    Kimberly [name] notes that since the beginning of the year, she has experienced four episodes of atrial fibrillation, which is unusual for her. The episodes are less severe and shorter in duration compared to previous ones. She experiences jaw pain and a mild headache as prodromal symptoms, prompting her to check her vital signs, confirming the arrhythmia. The episodes occur approximately once a month, with the most recent one lasting over three hours. She did not take flecainide during these episodes but continues to take Eliquis daily.  She is currently on inclisiran injections for cholesterol management, having started in October. She experienced mild hives after the first injection but none after the second. She has not had a cholesterol check since starting the injections.  She has a history of colon surgery, resulting in persistent diarrhea. To manage this, she takes two Imodium before leaving the house to ensure she can be out for three to four hours. Her sister has found relief with colestipol for similar symptoms, and she inquires about trying this medication herself.  She reports joint pain involving her knees, shoulders, legs, and now her hip, which she describes as worsening. This pain has made her more alert to her symptoms.      ROS: As per HPI  Studies Reviewed: .       Echo 03/2017: Study Conclusions   -  Left ventricle: The cavity size was normal. Wall thickness was    increased in a pattern of mild LVH. Indeterminant diastolic    function (atrial fibrillation). Systolic function was normal. The    estimated ejection fraction was in the range of 55% to 60%.    Although no diagnostic regional wall motion abnormality was    identified, this possibility  cannot be completely excluded on the    basis of this study.  - Aortic valve: There was no stenosis.  - Mitral valve: Mildly calcified annulus. There was no significant    regurgitation.  - Right ventricle: The cavity size was normal. Systolic function    was normal.  - Pulmonary arteries: No complete TR doppler jet so unable to    estimate Kimberly [name] systolic pressure.  - Systemic veins: IVC measured 2.0 cm with < 50% respirophasic    variation, suggesting RA pressure 8 mmHg.   Risk Assessment/Calculations:         Physical Exam:   VS:  BP (!) 143/76 (BP Location: Left Arm, Patient Position: Sitting)   Pulse 68   Ht 5\' 2"  (1.575 m)   Wt 297 lb (134.7 kg) Comment: Last weighed months ago.  SpO2 94%   BMI 54.32 kg/m  , BMI Body mass index is 54.32 kg/m. GENERAL:  Well appearing HEENT: Pupils equal round and reactive, fundi not visualized, oral mucosa unremarkable NECK:  No jugular venous distention, waveform within normal limits, carotid upstroke brisk and symmetric, no bruits, no thyromegaly LUNGS:  Clear to auscultation bilaterally HEART:  RRR.  PMI not displaced or sustained,S1 and S2 within normal limits, no S3, no S4, no clicks, no rubs, no murmurs ABD:  Flat, positive bowel sounds normal in frequency in pitch, no bruits, no rebound, no guarding, no midline pulsatile mass, no hepatomegaly, no splenomegaly EXT:  2 plus pulses throughout, no edema, no cyanosis no clubbing SKIN:  No rashes no nodules NEURO:  Cranial nerves II through XII grossly intact, motor grossly intact throughout PSYCH:  Cognitively intact, oriented to person place and time  EXTREMITIES:  No edema; No deformity   ASSESSMENT AND PLAN: .      # Paroxysmal Atrial Fibrillation Reports of four episodes since the start of the year, with symptoms of jaw pain and headache. Episodes are less severe and shorter in duration than previously experienced. Currently on Eliquis and takes Flecainide as needed. -Continue current  medication regimen. -Encouraged to take Flecainide if episodes last over six hours.   # Coronary calcification:  # Hyperlipidemia Currently on injectable cholesterol medication (Lecithin). Last cholesterol check was seven months ago. -Order lipid panel and comprehensive metabolic panel today. -Consider discontinuing Ezetimibe depending on lab results.   # Hypertension:  BP well-controlled at home.   High in the office today.  Continue to monitor at home.  -Continue losartan and spironolactone.  # Diarrhea Reports of frequent diarrhea requiring Imodium for symptom management. -Prescribe Colestipol for symptom management.  General Health Maintenance -Administer influenza vaccine today at the pharmacy.     Dispo: f/u with Kimberly Shields, Kimberly [name] in 6 months.  1 year with Kimberly [name] Salvia, MD, [facility]    Signed, Chilton Si, MD

## 2023-02-21 NOTE — Patient Instructions (Signed)
 Medication Instructions:  Your physician has recommended you make the following change in your medication:   Start: Colestipol  2g twice daily as needed  *If you need a refill on your cardiac medications before your next appointment, please call your pharmacy*   Lab Work:Your physician recommends that you return for lab work today- Lipid and CMP    Follow-Up: At [facility], you and your health needs are our priority.  As part of our continuing mission to provide you with exceptional heart care, we have created designated Provider Care Teams.  These Care Teams include your primary Cardiologist (physician) and Advanced Practice Providers (APPs -  Physician Assistants and Nurse Practitioners) who all work together to provide you with the care you need, when you need it.  We recommend signing up for the patient portal called "MyChart".  Sign up information is provided on this After Visit Summary.  MyChart is used to connect with patients for Virtual Visits (Telemedicine).  Patients are able to view lab/test results, encounter notes, upcoming appointments, etc.  Non-urgent messages can be sent to your provider as well.   To learn more about what you can do with MyChart, go to [email or web address].    Your next appointment:   6 months with Neomi Banks, NP   &   1 year with Dr. Theodis Fiscal

## 2023-02-22 LAB — COMPREHENSIVE METABOLIC PANEL
ALT: 19 [IU]/L (ref 0–32)
AST: 24 [IU]/L (ref 0–40)
Albumin: 4 g/dL (ref 3.8–4.8)
Alkaline Phosphatase: 74 [IU]/L (ref 44–121)
BUN/Creatinine Ratio: 28 (ref 12–28)
BUN: 19 mg/dL (ref 8–27)
Bilirubin Total: 0.4 mg/dL (ref 0.0–1.2)
CO2: 24 mmol/L (ref 20–29)
Calcium: 9.1 mg/dL (ref 8.7–10.3)
Chloride: 103 mmol/L (ref 96–106)
Creatinine, Ser: 0.68 mg/dL (ref 0.57–1.00)
Globulin, Total: 2.5 g/dL (ref 1.5–4.5)
Glucose: 103 mg/dL — ABNORMAL HIGH (ref 70–99)
Potassium: 4.5 mmol/L (ref 3.5–5.2)
Sodium: 143 mmol/L (ref 134–144)
Total Protein: 6.5 g/dL (ref 6.0–8.5)
eGFR: 90 mL/min/{1.73_m2} (ref >59)

## 2023-02-22 LAB — LIPID PANEL
Chol/HDL Ratio: 2.4 {ratio} (ref 0.0–4.4)
Cholesterol, Total: 171 mg/dL (ref 100–199)
HDL: 72 mg/dL (ref >39)
LDL Chol Calc (NIH): 84 mg/dL (ref 0–99)
Triglycerides: 80 mg/dL (ref 0–149)
VLDL Cholesterol Cal: 15 mg/dL (ref 5–40)

## 2023-02-25 ENCOUNTER — Telehealth: Payer: Self-pay

## 2023-02-25 NOTE — Telephone Encounter (Signed)
Auth Submission: NO AUTH NEEDED Site of care: Site of care: [facility] Payer: Medicare A/B and BCBS supplement Medication & CPT/J Code(s) submitted: Leqvio (Inclisiran) J1306 Route of submission (phone, fax, portal):  Phone # Fax # Auth type: Buy/Bill PB Units/visits requested: 284mg  x 2 doses Reference number:  Approval from: 02/25/23 to 02/11/24

## 2023-02-28 ENCOUNTER — Other Ambulatory Visit (HOSPITAL_BASED_OUTPATIENT_CLINIC_OR_DEPARTMENT_OTHER): Payer: Self-pay | Admitting: Family

## 2023-02-28 DIAGNOSIS — I480 Paroxysmal atrial fibrillation: Secondary | ICD-10-CM

## 2023-02-28 NOTE — Telephone Encounter (Signed)
Prescription refill request for Eliquis received. Indication:afib Last office visit:2/25 Scr:0.68   2/25 Age: 78 Weight:134.7  kg  Prescription refilled

## 2023-03-03 ENCOUNTER — Telehealth (HOSPITAL_BASED_OUTPATIENT_CLINIC_OR_DEPARTMENT_OTHER): Payer: Self-pay

## 2023-03-03 NOTE — Telephone Encounter (Addendum)
Results called to patient who verbalizes understanding!   ----- Message from Kimberly [name] sent at 03/02/2023  3:55 PM EST ----- Normal kidneys, liver, electrolytes.   LDL (bad cholesterol) previously 118 now 84. Improved, continue Incliseron.

## 2023-03-14 ENCOUNTER — Other Ambulatory Visit: Payer: Self-pay | Admitting: Physician Assistant

## 2023-03-14 NOTE — Telephone Encounter (Signed)
 Last Fill: 12/22/2022  Next Visit: 08/04/2023  Last Visit: 08/05/2022   Dx: Leukocytoclastic vasculitis   Current Dose per office note on 08/05/2022: Prednisone 5 mg 1 tablet by mouth daily.   Okay to refill Prednisone?

## 2023-03-16 ENCOUNTER — Encounter (HOSPITAL_BASED_OUTPATIENT_CLINIC_OR_DEPARTMENT_OTHER): Payer: Self-pay | Admitting: Cardiovascular Disease

## 2023-03-29 ENCOUNTER — Other Ambulatory Visit (HOSPITAL_BASED_OUTPATIENT_CLINIC_OR_DEPARTMENT_OTHER): Payer: Self-pay

## 2023-03-29 MED ORDER — COLESTIPOL HCL 1 G PO TABS: ORAL_TABLET | ORAL | 0 refills | Status: AC

## 2023-03-31 ENCOUNTER — Encounter (HOSPITAL_BASED_OUTPATIENT_CLINIC_OR_DEPARTMENT_OTHER): Payer: Self-pay | Admitting: *Deleted

## 2023-03-31 NOTE — Telephone Encounter (Signed)
 This encounter was created in error - please disregard.

## 2023-04-13 ENCOUNTER — Telehealth (HOSPITAL_BASED_OUTPATIENT_CLINIC_OR_DEPARTMENT_OTHER): Payer: Self-pay | Admitting: *Deleted

## 2023-04-13 NOTE — Telephone Encounter (Signed)
 LVM for Pharmacist at [facility] [address] @ [phone] due to receiving a script request for Colestipol.  Stated when medication was sent in last time pt will have to get from PCP.

## 2023-04-24 ENCOUNTER — Other Ambulatory Visit: Payer: Self-pay | Admitting: Internal Medicine

## 2023-06-13 ENCOUNTER — Other Ambulatory Visit: Payer: Self-pay | Admitting: Internal Medicine

## 2023-06-14 ENCOUNTER — Ambulatory Visit (INDEPENDENT_AMBULATORY_CARE_PROVIDER_SITE_OTHER): Payer: BLUE CROSS/BLUE SHIELD

## 2023-06-14 VITALS — BP 111/75 | HR 78 | Temp 98.9°F | Resp 18 | Ht 62.0 in | Wt 297.0 lb

## 2023-06-14 DIAGNOSIS — E785 Hyperlipidemia, unspecified: Secondary | ICD-10-CM

## 2023-06-14 DIAGNOSIS — I2510 Atherosclerotic heart disease of native coronary artery without angina pectoris: Secondary | ICD-10-CM

## 2023-06-14 MED ORDER — INCLISIRAN SODIUM 284 MG/1.5ML ~~LOC~~ SOSY
284.0000 mg | PREFILLED_SYRINGE | Freq: Once | SUBCUTANEOUS | Status: AC
  Administered 2023-06-14: 284 mg via SUBCUTANEOUS
  Filled 2023-06-14: qty 1.5

## 2023-06-14 NOTE — Progress Notes (Signed)
 Diagnosis: Hyperlipidemia  Provider:  Chilton Greathouse MD  Procedure: Injection  Leqvio (inclisiran), Dose: 284 mg, Site: subcutaneous, Number of injections: 1  Injection Site(s): Left arm  Post Care: Patient declined observation  Discharge: Condition: Stable, Destination: Home . AVS Provided  Performed by:  Wyvonne Lenz, RN

## 2023-06-20 ENCOUNTER — Other Ambulatory Visit: Payer: Self-pay | Admitting: Physician Assistant

## 2023-06-20 NOTE — Telephone Encounter (Signed)
 Last Fill: 03/14/2023  Next Visit: 08/04/2023  Last Visit: 08/05/2022  Dx: Leukocytoclastic vasculitis   Current Dose per office note on 08/05/2022: prednisone  5 mg p.o. daily   Okay to refill Prednisone ?

## 2023-06-30 NOTE — Patient Instructions (Addendum)
      Blood work was ordered.       Medications changes include :   None    A referral was ordered and someone will call you to schedule an appointment.     Return in about 1 year (around 06/30/2024) for follow up.

## 2023-06-30 NOTE — Progress Notes (Signed)
 Subjective:    Patient ID: Kimberly [name], female    DOB: August 18, 1945, 78 y.o.   MRN: [id number]     HPI Kimberly [name] is here for follow up of her chronic medical problems.  Overall stable.  Getting swelling in legs -- getting irritated spots where the skin may have torn.  This is chronic and intermittent.  She uses a steroid cream as needed which works well and would like a refill.  On injection for chol.     Medications and allergies reviewed with patient and updated if appropriate.  Current Outpatient Medications on File Prior to Visit  Medication Sig Dispense Refill   betamethasone  dipropionate 0.05 % cream APPLY TOPICALLY TWICE A DAY 45 g 0   CALCIUM  PO Take 1,000 mg by mouth at bedtime.     cholecalciferol  (VITAMIN D ) 1000 units tablet Take 1,000 Units by mouth at bedtime.     colestipol  (COLESTID ) 1 g tablet 2g (two tablets) twice daily as needed for diarrhea 60 tablet 0   diazepam  (VALIUM ) 5 MG tablet TAKE 1 TABLET BY MOUTH EVERY 12 HOURS AS NEEDED FOR ANXIETY 60 tablet 0   ELIQUIS  5 MG TABS tablet TAKE 1 TABLET BY MOUTH TWICE A DAY 180 tablet 1   ezetimibe  (ZETIA ) 10 MG tablet TAKE 1 TABLET BY MOUTH EVERY DAY 90 tablet 3   flecainide  (TAMBOCOR ) 150 MG tablet Take 1 tablet (150 mg total) by mouth as needed (for AFIB). 5 tablet 5   furosemide  (LASIX ) 40 MG tablet TAKE 1/2 TABLET BY MOUTH DAILY 45 tablet 1   inclisiran (LEQVIO ) 284 MG/1.5ML SOSY injection Inject 284 mg into the skin once.     losartan  (COZAAR ) 100 MG tablet TAKE 1 TABLET BY MOUTH EVERY DAY 90 tablet 1   Melatonin 10 MG CAPS Take 1 capsule by mouth at bedtime.     Multiple Vitamin (MULTIVITAMIN WITH MINERALS) TABS tablet Take 1 tablet by mouth at bedtime.     predniSONE  (DELTASONE ) 5 MG tablet TAKE 1 TABLET BY MOUTH EVERY DAY 30 tablet 2   pyridostigmine  (MESTINON ) 60 MG tablet Take 1 tablet (60 mg total) by mouth 3 (three) times daily. 270 tablet 3   pyridOXINE  (VITAMIN B-6) 100 MG tablet Take 100 mg  by mouth at bedtime.     spironolactone  (ALDACTONE ) 25 MG tablet TAKE 1 TABLET BY MOUTH EVERY DAY 90 tablet 2   vitamin B-12 (CYANOCOBALAMIN ) 1000 MCG tablet Take 1,000 mcg by mouth at bedtime.     zaleplon  (SONATA ) 5 MG capsule TAKE 1 CAPSULE BY MOUTH AT BEDTIME AS NEEDED FOR SLEEP 30 capsule 4   No current facility-administered medications on file prior to visit.     Review of Systems  Constitutional:  Negative for fever.  Respiratory:  Positive for shortness of breath. Negative for cough and wheezing.   Cardiovascular:  Positive for leg swelling. Negative for chest pain and palpitations.  Musculoskeletal:  Positive for arthralgias (shoulders, knees, left thumb, mild in ankles, fingers). Negative for back pain (occ back pain).  Neurological:  Positive for headaches (with afib). Negative for light-headedness.       Objective:   Vitals:   07/01/23 1502  BP: 134/72  Pulse: 96  Temp: 98.2 F (36.8 C)  SpO2: 91%   BP Readings from Last 3 Encounters:  07/01/23 134/72  06/14/23 111/75  02/21/23 (!) 143/76   Wt Readings from Last 3 Encounters:  06/14/23 297 lb (134.7 kg)  02/21/23 297 lb (  134.7 kg)  12/20/22 297 lb (134.7 kg)   Body mass index is 54.32 kg/m.    Physical Exam Constitutional:      General: She is not in acute distress.    Appearance: Normal appearance.  HENT:     Head: Normocephalic and atraumatic.   Eyes:     Conjunctiva/sclera: Conjunctivae normal.    Cardiovascular:     Rate and Rhythm: Normal rate and regular rhythm.     Heart sounds: Normal heart sounds.  Pulmonary:     Effort: Pulmonary effort is normal. No respiratory distress.     Breath sounds: Normal breath sounds. No wheezing.   Musculoskeletal:     Cervical back: Neck supple.     Right lower leg: Edema (1+) present.     Left lower leg: Edema (1+) present.  Lymphadenopathy:     Cervical: No cervical adenopathy.   Skin:    General: Skin is warm and dry.     Findings: Erythema  (Mild erythema left lower leg which looks to be related to swelling) and lesion (Small ulcer from recent laceration-healing.  No surrounding erythema.  No active discharge or bleeding.  Slight tenderness) present. No rash.   Neurological:     Mental Status: She is alert. Mental status is at baseline.   Psychiatric:        Mood and Affect: Mood normal.        Behavior: Behavior normal.        Lab Results  Component Value Date   WBC 12.4 (H) 06/30/2022   HGB 13.7 06/30/2022   HCT 43.3 06/30/2022   PLT 304.0 06/30/2022   GLUCOSE 103 (H) 02/21/2023   CHOL 171 02/21/2023   TRIG 80 02/21/2023   HDL 72 02/21/2023   LDLCALC 84 02/21/2023   ALT 19 02/21/2023   AST 24 02/21/2023   NA 143 02/21/2023   K 4.5 02/21/2023   CL 103 02/21/2023   CREATININE 0.68 02/21/2023   BUN 19 02/21/2023   CO2 24 02/21/2023   TSH 2.59 06/30/2022   INR 1.11 11/18/2016   HGBA1C 5.3 06/30/2022     Assessment & Plan:    See Problem List for Assessment and Plan of chronic medical problems.

## 2023-07-01 ENCOUNTER — Ambulatory Visit (INDEPENDENT_AMBULATORY_CARE_PROVIDER_SITE_OTHER): Payer: BLUE CROSS/BLUE SHIELD | Admitting: Internal Medicine

## 2023-07-01 ENCOUNTER — Encounter: Payer: Self-pay | Admitting: Internal Medicine

## 2023-07-01 VITALS — BP 134/72 | HR 96 | Temp 98.2°F | Ht 62.0 in

## 2023-07-01 DIAGNOSIS — G479 Sleep disorder, unspecified: Secondary | ICD-10-CM

## 2023-07-01 DIAGNOSIS — E782 Mixed hyperlipidemia: Secondary | ICD-10-CM

## 2023-07-01 DIAGNOSIS — M8589 Other specified disorders of bone density and structure, multiple sites: Secondary | ICD-10-CM

## 2023-07-01 DIAGNOSIS — I10 Essential (primary) hypertension: Secondary | ICD-10-CM | POA: Diagnosis not present

## 2023-07-01 DIAGNOSIS — F411 Generalized anxiety disorder: Secondary | ICD-10-CM

## 2023-07-01 DIAGNOSIS — G7000 Myasthenia gravis without (acute) exacerbation: Secondary | ICD-10-CM | POA: Diagnosis not present

## 2023-07-01 DIAGNOSIS — I480 Paroxysmal atrial fibrillation: Secondary | ICD-10-CM

## 2023-07-01 DIAGNOSIS — R739 Hyperglycemia, unspecified: Secondary | ICD-10-CM | POA: Diagnosis not present

## 2023-07-01 DIAGNOSIS — E559 Vitamin D deficiency, unspecified: Secondary | ICD-10-CM

## 2023-07-01 DIAGNOSIS — R600 Localized edema: Secondary | ICD-10-CM | POA: Diagnosis not present

## 2023-07-01 LAB — COMPREHENSIVE METABOLIC PANEL WITH GFR
ALT: 23 U/L (ref 0–35)
AST: 37 U/L (ref 0–37)
Albumin: 4.1 g/dL (ref 3.5–5.2)
Alkaline Phosphatase: 59 U/L (ref 39–117)
BUN: 17 mg/dL (ref 6–23)
CO2: 31 meq/L (ref 19–32)
Calcium: 9.4 mg/dL (ref 8.4–10.5)
Chloride: 101 meq/L (ref 96–112)
Creatinine, Ser: 0.77 mg/dL (ref 0.40–1.20)
GFR: 74.74 mL/min (ref >60.00)
Glucose, Bld: 96 mg/dL (ref 70–99)
Potassium: 4.2 meq/L (ref 3.5–5.1)
Sodium: 140 meq/L (ref 135–145)
Total Bilirubin: 0.9 mg/dL (ref 0.2–1.2)
Total Protein: 7.4 g/dL (ref 6.0–8.3)

## 2023-07-01 LAB — CBC WITH DIFFERENTIAL/PLATELET
Basophils Absolute: 0.1 10*3/uL (ref 0.0–0.1)
Basophils Relative: 0.6 % (ref 0.0–3.0)
Eosinophils Absolute: 0.1 10*3/uL (ref 0.0–0.7)
Eosinophils Relative: 0.8 % (ref 0.0–5.0)
HCT: 40.5 % (ref 36.0–46.0)
Hemoglobin: 13.2 g/dL (ref 12.0–15.0)
Lymphocytes Relative: 16 % (ref 12.0–46.0)
Lymphs Abs: 1.8 10*3/uL (ref 0.7–4.0)
MCHC: 32.7 g/dL (ref 30.0–36.0)
MCV: 91.6 fl (ref 78.0–100.0)
Monocytes Absolute: 1.1 10*3/uL — ABNORMAL HIGH (ref 0.1–1.0)
Monocytes Relative: 9.6 % (ref 3.0–12.0)
Neutro Abs: 8.4 10*3/uL — ABNORMAL HIGH (ref 1.4–7.7)
Neutrophils Relative %: 73 % (ref 43.0–77.0)
Platelets: 287 10*3/uL (ref 150.0–400.0)
RBC: 4.43 Mil/uL (ref 3.87–5.11)
RDW: 14 % (ref 11.5–15.5)
WBC: 11.5 10*3/uL — ABNORMAL HIGH (ref 4.0–10.5)

## 2023-07-01 LAB — VITAMIN D 25 HYDROXY (VIT D DEFICIENCY, FRACTURES): VITD: 43.51 ng/mL (ref 30.00–100.00)

## 2023-07-01 LAB — TSH: TSH: 2.98 u[IU]/mL (ref 0.35–5.50)

## 2023-07-01 LAB — HEMOGLOBIN A1C: Hgb A1c MFr Bld: 5.5 % (ref 4.6–6.5)

## 2023-07-01 MED ORDER — BETAMETHASONE DIPROPIONATE 0.05 % EX CREA: TOPICAL_CREAM | Freq: Two times a day (BID) | CUTANEOUS | 0 refills | Status: DC

## 2023-07-01 NOTE — Assessment & Plan Note (Addendum)
 Chronic Increased anxiety recently-typically only taking diltiazem once a day, but on rare occasion will take it twice a day Continue diazepam  5 mg every 12 hours as needed

## 2023-07-02 ENCOUNTER — Ambulatory Visit: Payer: Self-pay | Admitting: Internal Medicine

## 2023-07-02 NOTE — Assessment & Plan Note (Signed)
 Chronic Controlled Worse recently, but has already improved Continue lasix  20 mg daily

## 2023-07-02 NOTE — Assessment & Plan Note (Signed)
 Chronic Paroxysmal On Eliquis  5 mg twice daily Takes flecainide  150 mg as needed Following with cardiology CBC, CMP, TSH

## 2023-07-02 NOTE — Assessment & Plan Note (Signed)
 Chronic Management per neurology-Dr. Tobie Controlled On prednisone  5 mg daily, Mestinon  60 mg 3 times daily

## 2023-07-02 NOTE — Assessment & Plan Note (Signed)
 Chronic Blood pressure well controlled CMP, CBC Continue losartan  100 mg daily, spironolactone  25 mg daily

## 2023-07-02 NOTE — Assessment & Plan Note (Signed)
 Chronic On Leqvio  every 6 months

## 2023-07-02 NOTE — Assessment & Plan Note (Signed)
 Chronic Has difficulty with sleeping Taking melatonin some nights Sonata  5 mg nightly as needed

## 2023-07-02 NOTE — Assessment & Plan Note (Signed)
 Chronic Deferred DEXA scan

## 2023-07-02 NOTE — Assessment & Plan Note (Signed)
Chronic Check a1c Low sugar / carb diet Stressed regular exercise  

## 2023-07-02 NOTE — Assessment & Plan Note (Signed)
 Chronic Taking vitamin D daily Check vitamin D level

## 2023-07-21 NOTE — Progress Notes (Signed)
 Office Visit Note  Patient: Kimberly [name]             Date of Birth: [birth year]           MRN: [id number]             PCP: Geofm Glade PARAS, MD Referring: Geofm Glade PARAS, MD Visit Date: 08/04/2023 Occupation: @GUAROCC @  Subjective:  Medication management  History of Present Illness: Kimberly [name] is a 78 y.o. female with leukocytoclastic vasculitis, osteoarthritis and degenerative disc disease.  She returns today after her last visit in July 2024.  She states she has not had any flares of vasculitis.  She notices mild rash which resolves by itself.  She remains on prednisone  5 mg daily without any interruption.  She states she developed a lesion on her right ankle for which Dr. Geofm wanted to give her antibiotics.  Patient requested topical steroid because she had good response to it in the past.  She states the topical steroid betamethasone  dipropionate cream has been helping.    Activities of Daily Living:  Patient reports morning stiffness for 0 minute.   Patient Reports nocturnal pain.  Difficulty dressing/grooming: Reports Difficulty climbing stairs: Reports Difficulty getting out of chair: Reports Difficulty using hands for taps, buttons, cutlery, and/or writing: Reports  Review of Systems  Constitutional:  Positive for fatigue.  HENT:  Positive for mouth dryness. Negative for mouth sores.   Eyes:  Negative for dryness.  Respiratory:  Negative for difficulty breathing.   Cardiovascular:  Positive for palpitations. Negative for chest pain.  Gastrointestinal:  Positive for diarrhea. Negative for blood in stool and constipation.  Endocrine: Negative for increased urination.  Genitourinary:  Negative for involuntary urination.  Musculoskeletal:  Positive for joint pain, gait problem, joint pain, joint swelling, myalgias, muscle weakness, muscle tenderness and myalgias. Negative for morning stiffness.  Skin:  Positive for rash and sensitivity to sunlight. Negative for  color change and hair loss.  Allergic/Immunologic: Positive for susceptible to infections.  Neurological:  Positive for headaches. Negative for dizziness.  Hematological:  Negative for swollen glands.  Psychiatric/Behavioral:  Positive for sleep disturbance. Negative for depressed mood. The patient is nervous/anxious.     PMFS History:  Patient Active Problem List   Diagnosis Date Noted   Statin intolerance 02/21/2023   Hyperlipidemia LDL goal <70 08/19/2022   Generalized osteoarthritis 06/29/2022   Aortic atherosclerosis (HCC) 11/02/2021   Arthralgia 06/01/2021   Lower abdominal pain 06/01/2021   Retinal detachment, right 09/17/2020   GAD (generalized anxiety disorder) 06/30/2020   Nose disorder 04/14/2020   Diverticular stricture (HCC) 03/18/2017   Sleep difficulties 10/31/2016   Bilateral leg edema 08/20/2016   Recurrent UTI 08/15/2016   Psoriasis 04/13/2016   History of diverticulitis 04/13/2016   Osteopenia of multiple sites 04/13/2016   DJD (degenerative joint disease), cervical 04/07/2016   Primary osteoarthritis of both knees 04/07/2016   Paroxysmal atrial fibrillation (HCC) 05/12/2015   NSVT (nonsustained ventricular tachycardia) (HCC) 05/12/2015   Abnormal nuclear stress test - INTERMEDIATE RISK 04/14/2015   Cardiovascular disease with arteriosclerosis 02/28/2015   Hyperglycemia 08/09/2014   Dyspnea 03/20/2013   Umbilical hernia 10/20/2012   Pleural effusion 05/10/2012   Myasthenia gravis without exacerbation (HCC) 02/12/2010   Vitamin D  deficiency 06/25/2008   Hyperlipidemia 06/25/2008   Essential hypertension 06/25/2008   Leukocytoclastic vasculitis (HCC) 06/19/2007   ANA POSITIVE, HX OF 01/24/2007    Past Medical History:  Diagnosis Date   Allergy    Aortic  atherosclerosis (HCC) 11/02/2021   Atrial tachycardia (HCC) 05/12/2015   Cataract    Colovesical fistula    Diverticulitis    Diverticulosis    DJD (degenerative joint disease), cervical     Frequent UTI    GERD (gastroesophageal reflux disease)    History of hiatal hernia    Small hernia   Hypertension    Internal hemorrhoids 01/21/2017   noted on colonopscopy   Leukocytoclastic vasculitis (HCC)    Myalgia and myositis, unspecified    Myasthenia gravis without exacerbation (HCC)    [facility], Dr Pablo   NSVT (nonsustained ventricular tachycardia) (HCC) 05/12/2015   Osteoarthritis    Both knees   Osteopenia    Other and unspecified hyperlipidemia    Periumbilical hernia    Pneumonia 04/2016   PONV (postoperative nausea and vomiting)    Psoriasis    plantar ;Dr Bard Molt   Sleep difficulties    Statin intolerance 02/21/2023   Vasculitis (HCC)    Dr Dolphus   Vitamin D  deficiency     Family History  Problem Relation Age of Onset   Heart attack Mother 38   Heart failure Mother    Heart failure Father    Lung cancer Father    Cancer Father        lung   Hyperthyroidism Sister        Graves   Hypertension Sister    Stroke Paternal Grandmother        in 68s   Diabetes Neg Hx    Colon cancer Neg Hx    Esophageal cancer Neg Hx    Stomach cancer Neg Hx    Rectal cancer Neg Hx    Past Surgical History:  Procedure Laterality Date   APPENDECTOMY     & exploratory for inflammation; Dr Gladis   AV FISTULA REPAIR     CARDIAC CATHETERIZATION N/A 04/17/2015   Procedure: Left Heart Cath and Coronary Angiography;  Surgeon: Alm LELON Clay, MD;  Location: [facility] INVASIVE CV LAB;  Service: Cardiovascular;  Laterality: N/A;   CERVICAL FUSION     C5-6; Dr Unice   CHOLECYSTECTOMY     for stones   COLONOSCOPY W/ POLYPECTOMY  01/21/2017   CYSTOSCOPY WITH STENT PLACEMENT Bilateral 03/18/2017   Procedure: CYSTOSCOPY WITH RETROGRADE AND BILATERAL URETERAL STENT PLACEMENT;  Surgeon: Carolee Sherwood JONETTA DOUGLAS, MD;  Location: [facility]S;  Service: Urology;  Laterality: Bilateral;   HERNIA REPAIR     INJECTION OF SILICONE OIL Right 09/16/2020   Procedure: INJECTION OF SILICONE OIL;   Surgeon: Tobie Baptist, MD;  Location: [facility] OR;  Service: Ophthalmology;  Laterality: Right;   IR RADIOLOGIST EVAL & MGMT  11/16/2016   IR RADIOLOGIST EVAL & MGMT  12/08/2016   IR RADIOLOGIST EVAL & MGMT  12/16/2016   LAMINECTOMY  2010   T1-2; Dr Unice PRESCOTT PLANA VITRECTOMY Right 09/16/2020   Procedure: PARS PLANA VITRECTOMY WITH 25 GAUGE With LASER;  Surgeon: Tobie Baptist, MD;  Location: [facility] OR;  Service: Ophthalmology;  Laterality: Right;   PARTIAL COLECTOMY N/A 03/18/2017   Procedure: OPEN SIGMOIDECTOMY;  Surgeon: Debby Hila, MD;  Location: [facility];  Service: General;  Laterality: N/A;   REPAIR OF COMPLEX TRACTION RETINAL DETACHMENT Right 09/16/2020   Procedure: REPAIR OF COMPLEX TRACTION RETINAL DETACHMENT;  Surgeon: Tobie Baptist, MD;  Location: [facility] OR;  Service: Ophthalmology;  Laterality: Right;   SHOULDER SURGERY     Left & Right   THORACENTESIS     for  peripneumonic effusion   TOTAL ABDOMINAL HYSTERECTOMY W/ BILATERAL SALPINGOOPHORECTOMY     For Fibroids & endometriosis   TOTAL SHOULDER ARTHROPLASTY     UMBILICAL HERNIA REPAIR N/A 03/18/2017   Procedure: OPEN HERNIA REPAIR UMBILICAL ADULT;  Surgeon: Debby Hila, MD;  Location: [facility] OR[facility];  Service: General;  Laterality: N/A;  ERAs pathway   Social History   Social History Narrative   Lives with husband in a one story home.  Had one child that only lived for 14 hours.     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          Epworth Sleepiness Scale = 2 (as of 03/14/2015)      Right Handed    Immunization History  Administered Date(s) Administered   Fluad  Trivalent(High Dose 65+) 02/21/2023   Influenza, High Dose Seasonal PF 09/11/2012, 11/01/2016, 11/24/2017   Influenza,inj,Quad PF,6+ Mos 10/12/2014   Influenza-Unspecified 10/11/2013, 10/24/2015   PFIZER(Purple Top)SARS-COV-2 Vaccination 11/04/2019, 12/02/2019, 07/31/2020   Pfizer Covid-19 Vaccine Bivalent Booster 68yrs & up 12/04/2019    Pneumococcal Conjugate-13 05/09/2015   Pneumococcal Polysaccharide-23 04/19/2013     Objective: Vital Signs: BP 110/80 (BP Location: Right Wrist, Patient Position: Sitting)   Resp 16   Ht 5' 2 (1.575 m)   BMI 54.32 kg/m    Physical Exam Vitals and nursing note reviewed.  Constitutional:      Appearance: She is well-developed.  HENT:     Head: Normocephalic and atraumatic.  Eyes:     Conjunctiva/sclera: Conjunctivae normal.  Cardiovascular:     Rate and Rhythm: Normal rate and regular rhythm.     Heart sounds: Normal heart sounds.  Pulmonary:     Effort: Pulmonary effort is normal.     Breath sounds: Normal breath sounds.  Abdominal:     General: Bowel sounds are normal.     Palpations: Abdomen is soft.  Musculoskeletal:     Cervical back: Normal range of motion.     Right lower leg: Edema present.     Left lower leg: Edema present.  Lymphadenopathy:     Cervical: No cervical adenopathy.  Skin:    General: Skin is warm and dry.     Capillary Refill: Capillary refill takes less than 2 seconds.     Comments: Superficial ulceration 3 x 1 mm was noted over the medial aspect of right ankle no erythema was noted.  Neurological:     Mental Status: She is alert and oriented to person, place, and time.  Psychiatric:        Behavior: Behavior normal.      Musculoskeletal Exam: Patient was examined in the wheelchair.  Cervical spine was in limited range of motion.  Shoulder joint abduction was about 30 degrees on the right and about 50 degrees on the left.  She had limited internal rotation.  She had difficulty pronating her right arm.  Healthy joints and good range of motion.  PIP and DIP thickening with no synovitis was noted.  Hip joints could not be assessed.  Knee joints were in good range of motion with crepitus.  There was no tenderness over ankles and MTPs.  Bilateral lower extremity edema was noted.  CDAI Exam: CDAI Score: -- Patient Global: --; Provider Global:  -- Swollen: --; Tender: -- Joint Exam 08/04/2023   No joint exam has been documented for this visit   There is currently no information documented on the homunculus. Go to the Rheumatology activity  and complete the homunculus joint exam.  Investigation: No additional findings.  Imaging: No results found.  Recent Labs: Lab Results  Component Value Date   WBC 11.5 (H) 07/01/2023   HGB 13.2 07/01/2023   PLT 287.0 07/01/2023   NA 140 07/01/2023   K 4.2 HEMOLYSIS SEEN... 07/01/2023   CL 101 07/01/2023   CO2 31 07/01/2023   GLUCOSE 96 07/01/2023   BUN 17 07/01/2023   CREATININE 0.77 07/01/2023   BILITOT 0.9 07/01/2023   ALKPHOS 59 07/01/2023   AST 37 07/01/2023   ALT 23 07/01/2023   PROT 7.4 07/01/2023   ALBUMIN  4.1 07/01/2023   CALCIUM  9.4 07/01/2023   GFRAA >60 03/22/2018    Speciality Comments: No specialty comments available.  Procedures:  No procedures performed Allergies: Azithromycin, Ibuprofen, Levaquin  [levofloxacin  in d5w], Tetracycline, Isovue  [iopamidol ], Sulfonamide derivatives, Aleve [naproxen], Tape, Zosyn  [piperacillin  sod-tazobactam so], Amlodipine  besy-benazepril hcl, and Amoxicillin    Assessment / Plan:     Visit Diagnoses: Leukocytoclastic vasculitis (HCC)-returns today after her last visit about a year ago.  She has not had any major flares of vasculitis.  She states she notices intermittent rash.  She remains on prednisone  5 mg p.o. daily without any interruption.  No rash suggestive of vasculitis was noted today.  Per patient's request prescription for prednisone  5 mg p.o. daily 90-day supply with 1 refill was sent.  Long term (current) use of systemic steroids - On prednisone  5 mg p.o. daily.  Hemoglobin A1c 5.5 on July 01, 2023.  She gets labs with her PCP.  July 01, 2023 WBC count was elevated at 11.5.  CMP was normal.  Sed rate remains stable at 43.51.  H/O repair of rotator cuff - By Dr. Murrell.  She has very limited range of motion of bilateral  shoulders.  Primary osteoarthritis of both knees-she continues to have discomfort in her knee joints.  Crepitus was noted in bilateral knee joints without any warmth swelling or effusion.  Ulcer of right foot, limited to breakdown of skin (HCC)-she had a small ulceration over the medial aspect of her right ankle with a breakdown of skin.  She has been using topical steroid cream which she believes is effective.  I advised her to follow-up closely with her PCP to monitor for any worsening of ulceration.  Increased risk of infection with prednisone  use was discussed.  She may need referral to wound care if the symptoms get worse.  DDD (degenerative disc disease), cervical-she had limited range of motion without discomfort.  Psoriasis-no active lesions are present today.  Osteopenia of multiple sites -patient does not want to have DEXA scan.  History of vitamin D  deficiency-she remains on vitamin D  1000 units daily.  Other medical problems are listed as follows:  Myasthenia gravis without exacerbation (HCC)  Paroxysmal atrial fibrillation (HCC)  History of hypertension  History of hyperlipidemia  History of diverticulitis  Orders: No orders of the defined types were placed in this encounter.  Meds ordered this encounter  Medications   predniSONE  (DELTASONE ) 5 MG tablet    Sig: Take 1 tablet (5 mg total) by mouth daily.    Dispense:  90 tablet    Refill:  1     Follow-Up Instructions: Return in about 1 year (around 08/03/2024) for Vasculitis.   Maya Nash, MD  Note - This record has been created using Animal nutritionist.  Chart creation errors have been sought, but may not always  have been located. Such creation errors do not reflect  on  the standard of medical care.

## 2023-07-29 ENCOUNTER — Other Ambulatory Visit: Payer: Self-pay | Admitting: Internal Medicine

## 2023-08-04 ENCOUNTER — Ambulatory Visit: Payer: BLUE CROSS/BLUE SHIELD | Attending: Rheumatology | Admitting: Rheumatology

## 2023-08-04 ENCOUNTER — Encounter: Payer: Self-pay | Admitting: Rheumatology

## 2023-08-04 VITALS — BP 110/80 | Resp 16 | Ht 62.0 in

## 2023-08-04 DIAGNOSIS — L409 Psoriasis, unspecified: Secondary | ICD-10-CM | POA: Diagnosis not present

## 2023-08-04 DIAGNOSIS — Z8719 Personal history of other diseases of the digestive system: Secondary | ICD-10-CM | POA: Diagnosis not present

## 2023-08-04 DIAGNOSIS — M5030 Other cervical disc degeneration, unspecified cervical region: Secondary | ICD-10-CM | POA: Insufficient documentation

## 2023-08-04 DIAGNOSIS — I480 Paroxysmal atrial fibrillation: Secondary | ICD-10-CM | POA: Diagnosis not present

## 2023-08-04 DIAGNOSIS — M7061 Trochanteric bursitis, right hip: Secondary | ICD-10-CM

## 2023-08-04 DIAGNOSIS — M170 Bilateral primary osteoarthritis of knee: Secondary | ICD-10-CM | POA: Diagnosis not present

## 2023-08-04 DIAGNOSIS — M8589 Other specified disorders of bone density and structure, multiple sites: Secondary | ICD-10-CM | POA: Insufficient documentation

## 2023-08-04 DIAGNOSIS — M310 Hypersensitivity angiitis: Secondary | ICD-10-CM | POA: Diagnosis not present

## 2023-08-04 DIAGNOSIS — Z9889 Other specified postprocedural states: Secondary | ICD-10-CM | POA: Insufficient documentation

## 2023-08-04 DIAGNOSIS — G7000 Myasthenia gravis without (acute) exacerbation: Secondary | ICD-10-CM | POA: Diagnosis not present

## 2023-08-04 DIAGNOSIS — Z8639 Personal history of other endocrine, nutritional and metabolic disease: Secondary | ICD-10-CM | POA: Insufficient documentation

## 2023-08-04 DIAGNOSIS — L97511 Non-pressure chronic ulcer of other part of right foot limited to breakdown of skin: Secondary | ICD-10-CM | POA: Insufficient documentation

## 2023-08-04 DIAGNOSIS — Z7952 Long term (current) use of systemic steroids: Secondary | ICD-10-CM | POA: Insufficient documentation

## 2023-08-04 DIAGNOSIS — Z8679 Personal history of other diseases of the circulatory system: Secondary | ICD-10-CM | POA: Diagnosis not present

## 2023-08-04 MED ORDER — PREDNISONE 5 MG PO TABS: 5.0000 mg | ORAL_TABLET | Freq: Every day | ORAL | 1 refills | Status: DC

## 2023-08-22 DIAGNOSIS — H35371 Puckering of macula, right eye: Secondary | ICD-10-CM | POA: Diagnosis not present

## 2023-08-22 DIAGNOSIS — Z8669 Personal history of other diseases of the nervous system and sense organs: Secondary | ICD-10-CM | POA: Diagnosis not present

## 2023-08-22 DIAGNOSIS — H353131 Nonexudative age-related macular degeneration, bilateral, early dry stage: Secondary | ICD-10-CM | POA: Diagnosis not present

## 2023-08-22 DIAGNOSIS — H35363 Drusen (degenerative) of macula, bilateral: Secondary | ICD-10-CM | POA: Diagnosis not present

## 2023-08-22 DIAGNOSIS — H59811 Chorioretinal scars after surgery for detachment, right eye: Secondary | ICD-10-CM | POA: Diagnosis not present

## 2023-08-25 NOTE — Progress Notes (Signed)
 Cardiology Office Note:    Date:  08/26/2023   ID:  Kimberly [name], DOB 11/03/1945, MRN [id number]  PCP:  Geofm Glade PARAS, MD    [facility] Providers Cardiologist:  Annabella Scarce, MD     Referring MD: Geofm Glade PARAS, MD   Chief complaint: 6 month follow up  History of Present Illness:    Kimberly [name] is a 78 y.o. female with a hx of paroxysmal atrial fibrillation, hypertension, coronary artery calcification, mycins gravis, right lower extremity lymphedema, recurrent right pleural effusion, hyperlipidemia who presents for follow-up.    Lexiscan Cardiolite in 03/2015 that was concerning for inferior and apical ischemia, subsequent left cardiac cath showed normal but tortuous coronary arteries.  She reported palpitations and cardiac event monitor that showed paroxysmal atrial tachycardia and NSVT.  She was not started on any nodal agents due to her history of myasthenia gravis.  Admitted 03/2017 for repair of colovesicular fistula, where she developed A-fib with RVR on postop day 6.  Was started on amiodarone and converted back to sinus rhythm.  She followed up outpatient with side effects from amiodarone, it was stopped due to the belief that the atrial fibrillation was caused by operative stress.  A 30-day event monitor was worn following a cessation of the amiodarone, she did not have any atrial fibrillation during that time.  A year later she had another episode of A-fib and was seen in the atrial fibrillation clinic, restarted on Eliquis, and started on flecainide to be taken as needed.  In February 2025 she reported that she had had more episodes of atrial fibrillation this year than were normal for her.  She does have a statin intolerance and is on inclisiran  injections for cholesterol management.  Presents to the office today with her husband.  Patient states she feels as though she is doing well from a cardiovascular standpoint.  Denies chest pain, shortness of breath,  palpitations, dizziness, nausea, bloody/dark stools, leg swelling.  Reports relatively few episodes of atrial fibrillation that she is aware of, averaging around 1 episode per month, terminate spontaneously. 1 episode in January required her to take flecainide, tolerated the flecainide well, A-fib episode terminated within 2 hours of taking the flecainide.  Following these episodes of atrial fibrillation the patient is very rundown over the next 2-3 days. Requesting refill on flecainide due to expiration date.  Tolerated inclisiran well, due for next dose in December.  Denies any headache, flushing, near syncopal episodes while on the losartan, believes it is controlling her blood pressure well.  Discussed possibilities of wearing a 30-day heart monitor to evaluate how often she is in atrial fibrillation, patient declined.  She is taking her Eliquis 5 mg twice daily, this is the appropriate dose based on age, weight, creatinine.  Discussed possibly following up with our electrophysiology specialist, patient states she will consider this if episodes increase in frequency and severity but is not currently interested in surgical options.     Past Medical History:  Diagnosis Date   Allergy    Aortic atherosclerosis (HCC) 11/02/2021   Atrial tachycardia (HCC) 05/12/2015   Cataract    Colovesical fistula    Diverticulitis    Diverticulosis    DJD (degenerative joint disease), cervical    Frequent UTI    GERD (gastroesophageal reflux disease)    History of hiatal hernia    Small hernia   Hypertension    Internal hemorrhoids 01/21/2017   noted on colonopscopy   Leukocytoclastic vasculitis (  HCC)    Myalgia and myositis, unspecified    Myasthenia gravis without exacerbation (HCC)    [facility], Dr Pablo   NSVT (nonsustained ventricular tachycardia) (HCC) 05/12/2015   Osteoarthritis    Both knees   Osteopenia    Other and unspecified hyperlipidemia    Periumbilical hernia    Pneumonia 04/2016   PONV  (postoperative nausea and vomiting)    Psoriasis    plantar ;Dr Bard Molt   Sleep difficulties    Statin intolerance 02/21/2023   Vasculitis (HCC)    Dr Dolphus   Vitamin D deficiency     Past Surgical History:  Procedure Laterality Date   APPENDECTOMY     & exploratory for inflammation; Dr Gladis   AV FISTULA REPAIR     CARDIAC CATHETERIZATION N/A 04/17/2015   Procedure: Left Heart Cath and Coronary Angiography;  Surgeon: Alm LELON Clay, MD;  Location: [facility] INVASIVE CV LAB;  Service: Cardiovascular;  Laterality: N/A;   CERVICAL FUSION     C5-6; Dr Unice   CHOLECYSTECTOMY     for stones   COLONOSCOPY W/ POLYPECTOMY  01/21/2017   CYSTOSCOPY WITH STENT PLACEMENT Bilateral 03/18/2017   Procedure: CYSTOSCOPY WITH RETROGRADE AND BILATERAL URETERAL STENT PLACEMENT;  Surgeon: Carolee Sherwood JONETTA DOUGLAS, MD;  Location: [facility];  Service: Urology;  Laterality: Bilateral;   HERNIA REPAIR     INJECTION OF SILICONE OIL Right 09/16/2020   Procedure: INJECTION OF SILICONE OIL;  Surgeon: Tobie Baptist, MD;  Location: [facility] OR;  Service: Ophthalmology;  Laterality: Right;   IR RADIOLOGIST EVAL & MGMT  11/16/2016   IR RADIOLOGIST EVAL & MGMT  12/08/2016   IR RADIOLOGIST EVAL & MGMT  12/16/2016   LAMINECTOMY  2010   T1-2; Dr Unice PRESCOTT PLANA VITRECTOMY Right 09/16/2020   Procedure: PARS PLANA VITRECTOMY WITH 25 GAUGE With LASER;  Surgeon: Tobie Baptist, MD;  Location: [facility] OR;  Service: Ophthalmology;  Laterality: Right;   PARTIAL COLECTOMY N/A 03/18/2017   Procedure: OPEN SIGMOIDECTOMY;  Surgeon: Debby Hila, MD;  Location: [facility];  Service: General;  Laterality: N/A;   REPAIR OF COMPLEX TRACTION RETINAL DETACHMENT Right 09/16/2020   Procedure: REPAIR OF COMPLEX TRACTION RETINAL DETACHMENT;  Surgeon: Tobie Baptist, MD;  Location: [facility] OR;  Service: Ophthalmology;  Laterality: Right;   SHOULDER SURGERY     Left & Right   THORACENTESIS     for peripneumonic effusion   TOTAL ABDOMINAL HYSTERECTOMY W/  BILATERAL SALPINGOOPHORECTOMY     For Fibroids & endometriosis   TOTAL SHOULDER ARTHROPLASTY     UMBILICAL HERNIA REPAIR N/A 03/18/2017   Procedure: OPEN HERNIA REPAIR UMBILICAL ADULT;  Surgeon: Debby Hila, MD;  Location: [facility];  Service: General;  Laterality: N/A;  ERAs pathway    Current Medications: Current Meds  Medication Sig   betamethasone dipropionate 0.05 % cream Apply topically 2 (two) times daily.   CALCIUM PO Take 1,000 mg by mouth at bedtime.   cholecalciferol (VITAMIN D) 1000 units tablet Take 1,000 Units by mouth at bedtime.   colestipol (COLESTID) 1 g tablet 2g (two tablets) twice daily as needed for diarrhea   diazepam (VALIUM) 5 MG tablet TAKE 1 TABLET BY MOUTH EVERY 12 HOURS AS NEEDED FOR ANXIETY   ELIQUIS 5 MG TABS tablet TAKE 1 TABLET BY MOUTH TWICE A DAY   furosemide (LASIX) 40 MG tablet TAKE 1/2 TABLET BY MOUTH DAILY   inclisiran (LEQVIO) 284 MG/1.5ML SOSY injection Inject 284 mg into the skin once.  losartan (COZAAR) 100 MG tablet TAKE 1 TABLET BY MOUTH EVERY DAY   Melatonin 10 MG CAPS Take 1 capsule by mouth at bedtime.   Multiple Vitamin (MULTIVITAMIN WITH MINERALS) TABS tablet Take 1 tablet by mouth at bedtime.   predniSONE (DELTASONE) 5 MG tablet Take 1 tablet (5 mg total) by mouth daily.   pyridostigmine (MESTINON) 60 MG tablet Take 1 tablet (60 mg total) by mouth 3 (three) times daily.   pyridOXINE (VITAMIN B-6) 100 MG tablet Take 100 mg by mouth at bedtime.   spironolactone (ALDACTONE) 25 MG tablet TAKE 1 TABLET BY MOUTH EVERY DAY   vitamin B-12 (CYANOCOBALAMIN) 1000 MCG tablet Take 1,000 mcg by mouth at bedtime.   zaleplon (SONATA) 5 MG capsule TAKE 1 CAPSULE BY MOUTH AT BEDTIME AS NEEDED FOR SLEEP   [DISCONTINUED] flecainide (TAMBOCOR) 150 MG tablet Take 1 tablet (150 mg total) by mouth as needed (for AFIB).     Allergies:   Azithromycin, Ibuprofen, Levaquin [levofloxacin in d5w], Tetracycline, Isovue [iopamidol], Sulfonamide derivatives, Aleve  [naproxen], Tape, Zosyn [piperacillin sod-tazobactam so], Amlodipine besy-benazepril hcl, and Amoxicillin   Social History   Socioeconomic History   Marital status: Married    Spouse name: Not on file   Number of children: Not on file   Years of education: Not on file   Highest education level: Not on file  Occupational History   Occupation: [employer] Technologist    Comment: Dr Oneita  Tobacco Use   Smoking status: Never    Passive exposure: Past   Smokeless tobacco: Never  Vaping Use   Vaping status: Never Used  Substance and Sexual Activity   Alcohol use: No    Alcohol/week: 0.0 standard drinks of alcohol   Drug use: No   Sexual activity: Not Currently  Other Topics Concern   Not on file  Social History Narrative   Lives with husband in a one story home.  Had one child that only lived for 14 hours.     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          Epworth Sleepiness Scale = 2 (as of 03/14/2015)      Right Handed    Social Drivers of Health   Financial Resource Strain: Low Risk  (10/21/2022)   Overall Financial Resource Strain (CARDIA)    Difficulty of Paying Living Expenses: Not hard at all  Food Insecurity: No Food Insecurity (10/21/2022)   Hunger Vital Sign    Worried About Running Out of Food in the Last Year: Never true    Ran Out of Food in the Last Year: Never true  Transportation Needs: No Transportation Needs (10/21/2022)   PRAPARE - Administrator, Civil Service (Medical): No    Lack of Transportation (Non-Medical): No  Physical Activity: Sufficiently Active (10/21/2022)   Exercise Vital Sign    Days of Exercise per Week: 5 days    Minutes of Exercise per Session: 30 min  Stress: No Stress Concern Present (10/21/2022)   [employer] of Occupational Health - Occupational Stress Questionnaire    Feeling of Stress : Not at all  Social Connections: Socially Integrated (10/21/2022)   Social Connection  and Isolation Panel    Frequency of Communication with Friends and Family: More than three times a week    Frequency of Social Gatherings with Friends and Family: More than three times a week    Attends Religious Services: More than 4 times per  year    Active Member of Clubs or Organizations: Yes    Attends Engineer, structural: More than 4 times per year    Marital Status: Married     Family History: The patient's family history includes Cancer in her father; Congestive Heart Failure in her sister; Heart attack (age of onset: 6) in her mother; Heart failure in her father and mother; Hypertension in her sister; Hyperthyroidism in her sister; Lung cancer in her father; Stroke in her paternal grandmother. There is no history of Diabetes, Colon cancer, Esophageal cancer, Stomach cancer, or Rectal cancer.  ROS:   Please see the history of present illness.     All other systems reviewed and are negative.  EKGs/Labs/Other Studies Reviewed:         Recent Labs: 07/01/2023: ALT 23; BUN 17; Creatinine, Ser 0.77; Hemoglobin 13.2; Platelets 287.0; Potassium 4.2 HEMOLYSIS SEEN...; Sodium 140; TSH 2.98  Recent Lipid Panel    Component Value Date/Time   CHOL 171 02/21/2023 1627   CHOL 256 (H) 08/09/2014 0928   TRIG 80 02/21/2023 1627   TRIG 180 (H) 08/09/2014 0928   HDL 72 02/21/2023 1627   HDL 74 08/09/2014 0928   CHOLHDL 2.4 02/21/2023 1627   CHOLHDL 3 06/30/2022 1608   VLDL 30.4 06/30/2022 1608   LDLCALC 84 02/21/2023 1627   LDLCALC 146 (H) 08/09/2014 0928     Risk Assessment/Calculations:    CHA2DS2-VASc Score = 5   This indicates a 7.2% annual risk of stroke. The patient's score is based upon: CHF History: 0 HTN History: 1 Diabetes History: 0 Stroke History: 0 Vascular Disease History: 1 Age Score: 2 Gender Score: 1               Physical Exam:    VS:  BP 105/65 (BP Location: Left Wrist, Patient Position: Sitting, Cuff Size: Normal)   Pulse 77   Resp 16    Ht 5' 2 (1.575 m)   Wt 297 lb (134.7 kg)   SpO2 94%   BMI 54.32 kg/m     Wt Readings from Last 3 Encounters:  08/26/23 297 lb (134.7 kg)  06/14/23 297 lb (134.7 kg)  02/21/23 297 lb (134.7 kg)     GEN:  Well nourished, well developed in no acute distress HEENT: Normal NECK: No carotid bruits CARDIAC: S1-S2 normal, RRR, no murmurs, rubs, gallops RESPIRATORY:  Clear to auscultation without rales, wheezing or rhonchi  MUSCULOSKELETAL:  +1 edema bilateral isolated to ankles, +1 DP/PT pulses. Small, superficial laceration to right ankle patient states is slowly healing. No focal redness, warmth, swelling, drainage to site. SKIN: Warm and dry NEUROLOGIC:  Alert and oriented x 3 PSYCHIATRIC:  Normal affect   ASSESSMENT:    1. Paroxysmal atrial fibrillation (HCC)   2. Hyperlipidemia LDL goal <70   3. Essential hypertension    PLAN:    In order of problems listed above:   1.  Paroxysmal atrial fibrillation  - CHA2DS2-VASc Score = 5 [CHF History: 0, HTN History: 1, Diabetes History: 0, Stroke History: 0, Vascular Disease History: 1, Age Score: 2, Gender Score: 1].  Therefore, the patient's annual risk of stroke is 7.2 %.    - Continue Eliquis, 5 mg twice daily, as this is the appropriate dose of this prescription  - Take flecainide if atrial fibrillation episodes lasts over 6 hours - Patient instructed to call office to schedule follow up if episodes become more frequent, last longer, or if she's interested in pursuing  other options for management.  2.  Hyperlipidemia  -02/2023 LDL: 83 - Currently on injectable inclisiran, tolerated well, keep appointment for next dose in December    3. Hypertension  - Continue losartan 100 mg tablet daily  - BP well controlled  Follow up in 6 months or sooner if symptoms change, worsen, or new symptoms develop.      Medication Adjustments/Labs and Tests Ordered: Current medicines are reviewed at length with the patient today.  Concerns  regarding medicines are outlined above.  Orders Placed This Encounter  Procedures   EKG 12-Lead   Meds ordered this encounter  Medications   flecainide (TAMBOCOR) 150 MG tablet    Sig: Take 1 tablet (150 mg total) by mouth daily as needed (for AFIB).    Dispense:  10 tablet    Refill:  5    Patient Instructions  Medication Instructions:  Your physician recommends that you continue on your current medications as directed. Please refer to the Current Medication list given to you today.  *If you need a refill on your cardiac medications before your next appointment, please call your pharmacy*  Lab Work: If you have labs (blood work) drawn today and your tests are completely normal, you will receive your results only by: MyChart Message (if you have MyChart) OR A paper copy in the mail If you have any lab test that is abnormal or we need to change your treatment, we will call you to review the results.  Follow-Up: At [facility], you and your health needs are our priority.  As part of our continuing mission to provide you with exceptional heart care, our providers are all part of one team.  This team includes your primary Cardiologist (physician) and Advanced Practice Providers or APPs (Physician Assistants and Nurse Practitioners) who all work together to provide you with the care you need, when you need it.  Your next appointment:   6 month(s)  Provider:   Annabella Scarce, MD or Reche Finder, NP    We recommend signing up for the patient portal called MyChart.  Sign up information is provided on this After Visit Summary.  MyChart is used to connect with patients for Virtual Visits (Telemedicine).  Patients are able to view lab/test results, encounter notes, upcoming appointments, etc.  Non-urgent messages can be sent to your provider as well.   To learn more about what you can do with MyChart, go to [email or web address].          Signed, Miriam FORBES Shams, NP   08/26/2023 4:36 PM    [facility]

## 2023-08-26 ENCOUNTER — Ambulatory Visit (HOSPITAL_BASED_OUTPATIENT_CLINIC_OR_DEPARTMENT_OTHER): Admitting: Emergency Medicine

## 2023-08-26 ENCOUNTER — Encounter (HOSPITAL_BASED_OUTPATIENT_CLINIC_OR_DEPARTMENT_OTHER): Payer: Self-pay | Admitting: Emergency Medicine

## 2023-08-26 VITALS — BP 105/65 | HR 77 | Resp 16 | Ht 62.0 in | Wt 297.0 lb

## 2023-08-26 DIAGNOSIS — D6859 Other primary thrombophilia: Secondary | ICD-10-CM

## 2023-08-26 DIAGNOSIS — I10 Essential (primary) hypertension: Secondary | ICD-10-CM

## 2023-08-26 DIAGNOSIS — I480 Paroxysmal atrial fibrillation: Secondary | ICD-10-CM | POA: Diagnosis not present

## 2023-08-26 DIAGNOSIS — E785 Hyperlipidemia, unspecified: Secondary | ICD-10-CM

## 2023-08-26 MED ORDER — FLECAINIDE ACETATE 150 MG PO TABS: 150.0000 mg | ORAL_TABLET | Freq: Every day | ORAL | 5 refills | Status: AC | PRN

## 2023-08-26 NOTE — Patient Instructions (Signed)
 Medication Instructions:  Your physician recommends that you continue on your current medications as directed. Please refer to the Current Medication list given to you today.  *If you need a refill on your cardiac medications before your next appointment, please call your pharmacy*  Lab Work: If you have labs (blood work) drawn today and your tests are completely normal, you will receive your results only by: MyChart Message (if you have MyChart) OR A paper copy in the mail If you have any lab test that is abnormal or we need to change your treatment, we will call you to review the results.  Follow-Up: At [facility], you and your health needs are our priority.  As part of our continuing mission to provide you with exceptional heart care, our providers are all part of one team.  This team includes your primary Cardiologist (physician) and Advanced Practice Providers or APPs (Physician Assistants and Nurse Practitioners) who all work together to provide you with the care you need, when you need it.  Your next appointment:   6 month(s)  Provider:   Annabella Scarce, MD or Reche Finder, NP    We recommend signing up for the patient portal called MyChart.  Sign up information is provided on this After Visit Summary.  MyChart is used to connect with patients for Virtual Visits (Telemedicine).  Patients are able to view lab/test results, encounter notes, upcoming appointments, etc.  Non-urgent messages can be sent to your provider as well.   To learn more about what you can do with MyChart, go to [email or web address].

## 2023-08-29 ENCOUNTER — Other Ambulatory Visit: Payer: Self-pay | Admitting: Internal Medicine

## 2023-10-19 ENCOUNTER — Other Ambulatory Visit (HOSPITAL_BASED_OUTPATIENT_CLINIC_OR_DEPARTMENT_OTHER): Payer: Self-pay | Admitting: Family

## 2023-10-19 DIAGNOSIS — I480 Paroxysmal atrial fibrillation: Secondary | ICD-10-CM

## 2023-10-19 NOTE — Telephone Encounter (Signed)
 Prescription refill request for Eliquis  received. Indication: PAF Last office visit: 08/26/23  MARLA Shams NP Scr: 0.77 on 07/01/23  Epic Age: 78 Weight: 134.7kg   Based on above findings Eliquis  5mg  twice daily is the appropriate dose.  Refill approved.

## 2023-10-24 ENCOUNTER — Other Ambulatory Visit: Payer: Self-pay | Admitting: Internal Medicine

## 2023-10-25 ENCOUNTER — Ambulatory Visit: Payer: BLUE CROSS/BLUE SHIELD

## 2023-11-03 ENCOUNTER — Other Ambulatory Visit: Payer: Self-pay | Admitting: Internal Medicine

## 2023-11-15 ENCOUNTER — Other Ambulatory Visit: Payer: Self-pay

## 2023-11-15 DIAGNOSIS — I10 Essential (primary) hypertension: Secondary | ICD-10-CM

## 2023-11-15 MED ORDER — FUROSEMIDE 40 MG PO TABS: 20.0000 mg | ORAL_TABLET | Freq: Every day | ORAL | 1 refills | Status: AC

## 2023-12-15 ENCOUNTER — Other Ambulatory Visit: Payer: Self-pay | Admitting: Rheumatology

## 2023-12-15 ENCOUNTER — Ambulatory Visit

## 2023-12-15 VITALS — BP 134/66 | HR 71 | Temp 97.6°F | Resp 16 | Ht 62.0 in | Wt 297.0 lb

## 2023-12-15 DIAGNOSIS — E785 Hyperlipidemia, unspecified: Secondary | ICD-10-CM

## 2023-12-15 DIAGNOSIS — I2510 Atherosclerotic heart disease of native coronary artery without angina pectoris: Secondary | ICD-10-CM | POA: Diagnosis not present

## 2023-12-15 MED ORDER — INCLISIRAN SODIUM 284 MG/1.5ML ~~LOC~~ SOSY
284.0000 mg | PREFILLED_SYRINGE | Freq: Once | SUBCUTANEOUS | Status: AC
  Administered 2023-12-15: 284 mg via SUBCUTANEOUS
  Filled 2023-12-15: qty 1.5

## 2023-12-15 NOTE — Telephone Encounter (Signed)
 Last Fill: 08/04/2023  Next Visit: 08/09/2024  Last Visit: 08/04/2023  DX: Leukocytoclastic vasculitis   Current Dose per office note on 08/04/2023: On prednisone  5 mg p.o. daily.   Okay to refill prednisone ?

## 2023-12-15 NOTE — Progress Notes (Signed)
 Diagnosis: Hyperlipidemia  Provider:  Chilton Greathouse MD  Procedure: Injection  Leqvio (inclisiran), Dose: 284 mg, Site: subcutaneous, Number of injections: 1  Injection Site(s): Right arm  Post Care: Patient declined observation  Discharge: Condition: Good, Destination: Home . AVS Provided  Performed by:  Adriana Mccallum, RN

## 2023-12-21 ENCOUNTER — Telehealth: Payer: Self-pay | Admitting: Cardiovascular Disease

## 2023-12-21 DIAGNOSIS — I10 Essential (primary) hypertension: Secondary | ICD-10-CM

## 2023-12-21 NOTE — Telephone Encounter (Signed)
°*  STAT* If patient is at the pharmacy, call can be transferred to refill team.   1. Which medications need to be refilled? (please list name of each medication and dose if known)   spironolactone  (ALDACTONE ) 25 MG tablet    2. Which pharmacy/location (including street and city if local pharmacy) is medication to be sent to?  [id number] - [place], [place] - [address] AT [address]      3. Do they need a 30 day or 90 day supply? 90 day    Pt is out of medication

## 2023-12-22 MED ORDER — SPIRONOLACTONE 25 MG PO TABS: 25.0000 mg | ORAL_TABLET | Freq: Every day | ORAL | 1 refills | Status: AC

## 2023-12-22 NOTE — Telephone Encounter (Signed)
 Refill sent

## 2023-12-26 ENCOUNTER — Encounter: Payer: Self-pay | Admitting: Neurology

## 2023-12-26 ENCOUNTER — Ambulatory Visit: Payer: BLUE CROSS/BLUE SHIELD | Admitting: Neurology

## 2023-12-27 ENCOUNTER — Ambulatory Visit

## 2023-12-27 ENCOUNTER — Telehealth: Payer: Self-pay

## 2023-12-27 VITALS — Ht 62.0 in | Wt 297.0 lb

## 2023-12-27 DIAGNOSIS — Z0000 Encounter for general adult medical examination without abnormal findings: Secondary | ICD-10-CM | POA: Diagnosis not present

## 2023-12-27 NOTE — Telephone Encounter (Signed)
 would like the nurse to call regarding the Shingrix vaccine. [facility] mentioned that she should not receive but wants Dr Geofm' input.

## 2023-12-27 NOTE — Progress Notes (Signed)
 Chief Complaint  Patient presents with   Medicare Wellness     Subjective:   Kimberly [name] is a 78 y.o. female who presents for a Medicare Annual Wellness Visit.  Visit info / Clinical Intake: Medicare Wellness Visit Type:: Subsequent Annual Wellness Visit Persons participating in visit and providing information:: patient Medicare Wellness Visit Mode:: Telephone If telephone:: video declined Since this visit was completed virtually, some vitals may be partially provided or unavailable. Missing vitals are due to the limitations of the virtual format.: Documented vitals are patient reported If Telephone or Video please confirm:: I connected with patient using audio/video enable telemedicine. I verified patient identity with two identifiers, discussed telehealth limitations, and patient agreed to proceed. Patient Location:: Home Provider Location:: Office Interpreter Needed?: No Pre-visit prep was completed: yes AWV questionnaire completed by patient prior to visit?: no Living arrangements:: lives with spouse/significant other Patient's Overall Health Status Rating: good Typical amount of pain: none Does pain affect daily life?: no Are you currently prescribed opioids?: no  Dietary Habits and Nutritional Risks How many meals a day?: 2 Eats fruit and vegetables daily?: yes Most meals are obtained by: preparing own meals In the last 2 weeks, have you had any of the following?: none Diabetic:: no  Functional Status Activities of Daily Living (to include ambulation/medication): Independent Ambulation: Independent with device- listed below Home Assistive Devices/Equipment: Eyeglasses; Walker (specify Type) Medication Administration: Independent Home Management (perform basic housework or laundry): Independent Manage your own finances?: yes Primary transportation is: family / friends Concerns about vision?: no *vision screening is required for WTM* Concerns about hearing?:  no  Fall Screening Falls in the past year?: 0 Number of falls in past year: 0 Was there an injury with Fall?: 0 Fall Risk Category Calculator: 0 Patient Fall Risk Level: Low Fall Risk  Fall Risk Patient at Risk for Falls Due to: No Fall Risks; Impaired balance/gait Fall risk Follow up: Falls evaluation completed; Falls prevention discussed  Home and Transportation Safety: All rugs have non-skid backing?: N/A, no rugs All stairs or steps have railings?: yes Grab bars in the bathtub or shower?: yes Have non-skid surface in bathtub or shower?: yes Good home lighting?: yes Regular seat belt use?: yes Hospital stays in the last year:: no  Cognitive Assessment Difficulty concentrating, remembering, or making decisions? : no Will 6CIT or Mini Cog be Completed: yes What year is it?: 0 points What month is it?: 0 points Give patient an address phrase to remember (5 components): [address] [place], [place] About what time is it?: 0 points Count backwards from 20 to 1: 0 points Say the months of the year in reverse: 0 points Repeat the address phrase from earlier: 0 points 6 CIT Score: 0 points  Advance Directives (For Healthcare) Does Patient Have a Medical Advance Directive?: No Would patient like information on creating a medical advance directive?: Yes (MAU/Ambulatory/Procedural Areas - Information given)  Reviewed/Updated  Reviewed/Updated: Reviewed All (Medical, Surgical, Family, Medications, Allergies, Care Teams, Patient Goals)    Allergies (verified) Azithromycin, Ibuprofen, Levaquin  [levofloxacin  in d5w], Tetracycline, Isovue  [iopamidol ], Sulfonamide derivatives, Aleve [naproxen], Tape, Zosyn  [piperacillin  sod-tazobactam so], Amlodipine  besy-benazepril hcl, and Amoxicillin    Current Medications (verified) Outpatient Encounter Medications as of 12/27/2023  Medication Sig   betamethasone  dipropionate 0.05 % cream APPLY TOPICALLY TWICE A DAY   CALCIUM  PO Take 1,000 mg by  mouth at bedtime.   cholecalciferol  (VITAMIN D ) 1000 units tablet Take 1,000 Units by mouth at bedtime.  colestipol  (COLESTID ) 1 g tablet 2g (two tablets) twice daily as needed for diarrhea   diazepam  (VALIUM ) 5 MG tablet TAKE 1 TABLET BY MOUTH EVERY 12 HOURS AS NEEDED FOR ANXIETY   ELIQUIS  5 MG TABS tablet TAKE 1 TABLET BY MOUTH TWICE A DAY   flecainide  (TAMBOCOR ) 150 MG tablet Take 1 tablet (150 mg total) by mouth daily as needed (for AFIB).   furosemide  (LASIX ) 40 MG tablet Take 0.5 tablets (20 mg total) by mouth daily.   inclisiran (LEQVIO ) 284 MG/1.5ML SOSY injection Inject 284 mg into the skin once.   losartan  (COZAAR ) 100 MG tablet TAKE 1 TABLET BY MOUTH EVERY DAY   Melatonin 10 MG CAPS Take 1 capsule by mouth at bedtime.   Multiple Vitamin (MULTIVITAMIN WITH MINERALS) TABS tablet Take 1 tablet by mouth at bedtime.   predniSONE  (DELTASONE ) 5 MG tablet TAKE 1 TABLET BY MOUTH EVERY DAY   pyridostigmine  (MESTINON ) 60 MG tablet Take 1 tablet (60 mg total) by mouth 3 (three) times daily.   pyridOXINE  (VITAMIN B-6) 100 MG tablet Take 100 mg by mouth at bedtime.   spironolactone  (ALDACTONE ) 25 MG tablet Take 1 tablet (25 mg total) by mouth daily.   vitamin B-12 (CYANOCOBALAMIN ) 1000 MCG tablet Take 1,000 mcg by mouth at bedtime.   zaleplon  (SONATA ) 5 MG capsule TAKE 1 CAPSULE BY MOUTH AT BEDTIME AS NEEDED FOR SLEEP   No facility-administered encounter medications on file as of 12/27/2023.    History: Past Medical History:  Diagnosis Date   Allergy    Aortic atherosclerosis 11/02/2021   Atrial tachycardia 05/12/2015   Cataract    Colovesical fistula    Diverticulitis    Diverticulosis    DJD (degenerative joint disease), cervical    Frequent UTI    GERD (gastroesophageal reflux disease)    History of hiatal hernia    Small hernia   Hypertension    Internal hemorrhoids 01/21/2017   noted on colonopscopy   Leukocytoclastic vasculitis (HCC)    Myalgia and myositis, unspecified     Myasthenia gravis without exacerbation (HCC)    [facility], Dr Pablo   NSVT (nonsustained ventricular tachycardia) (HCC) 05/12/2015   Osteoarthritis    Both knees   Osteopenia    Other and unspecified hyperlipidemia    Periumbilical hernia    Pneumonia 04/2016   PONV (postoperative nausea and vomiting)    Psoriasis    plantar ;Dr Bard Molt   Sleep difficulties    Statin intolerance 02/21/2023   Vasculitis    Dr Dolphus   Vitamin D  deficiency    Past Surgical History:  Procedure Laterality Date   APPENDECTOMY     & exploratory for inflammation; Dr Gladis   AV FISTULA REPAIR     CARDIAC CATHETERIZATION N/A 04/17/2015   Procedure: Left Heart Cath and Coronary Angiography;  Surgeon: Alm LELON Clay, MD;  Location: [facility] INVASIVE CV LAB;  Service: Cardiovascular;  Laterality: N/A;   CERVICAL FUSION     C5-6; Dr Unice   CHOLECYSTECTOMY     for stones   COLONOSCOPY W/ POLYPECTOMY  01/21/2017   CYSTOSCOPY WITH STENT PLACEMENT Bilateral 03/18/2017   Procedure: CYSTOSCOPY WITH RETROGRADE AND BILATERAL URETERAL STENT PLACEMENT;  Surgeon: Carolee Sherwood JONETTA DOUGLAS, MD;  Location: [facility] ORS;  Service: Urology;  Laterality: Bilateral;   HERNIA REPAIR     INJECTION OF SILICONE OIL Right 09/16/2020   Procedure: INJECTION OF SILICONE OIL;  Surgeon: Tobie Baptist, MD;  Location: [facility] OR;  Service: Ophthalmology;  Laterality: Right;   IR RADIOLOGIST EVAL & MGMT  11/16/2016   IR RADIOLOGIST EVAL & MGMT  12/08/2016   IR RADIOLOGIST EVAL & MGMT  12/16/2016   LAMINECTOMY  2010   T1-2; Dr Unice PRESCOTT PLANA VITRECTOMY Right 09/16/2020   Procedure: PARS PLANA VITRECTOMY WITH 25 GAUGE With LASER;  Surgeon: Tobie Baptist, MD;  Location: [facility] OR;  Service: Ophthalmology;  Laterality: Right;   PARTIAL COLECTOMY N/A 03/18/2017   Procedure: OPEN SIGMOIDECTOMY;  Surgeon: Debby Hila, MD;  Location: [facility];  Service: General;  Laterality: N/A;   REPAIR OF COMPLEX TRACTION RETINAL DETACHMENT Right 09/16/2020    Procedure: REPAIR OF COMPLEX TRACTION RETINAL DETACHMENT;  Surgeon: Tobie Baptist, MD;  Location: [facility] OR;  Service: Ophthalmology;  Laterality: Right;   SHOULDER SURGERY     Left & Right   THORACENTESIS     for peripneumonic effusion   TOTAL ABDOMINAL HYSTERECTOMY W/ BILATERAL SALPINGOOPHORECTOMY     For Fibroids & endometriosis   TOTAL SHOULDER ARTHROPLASTY     UMBILICAL HERNIA REPAIR N/A 03/18/2017   Procedure: OPEN HERNIA REPAIR UMBILICAL ADULT;  Surgeon: Debby Hila, MD;  Location: [facility];  Service: General;  Laterality: N/A;  ERAs pathway   Family History  Problem Relation Age of Onset   Heart attack Mother 71   Heart failure Mother    Heart failure Father    Lung cancer Father    Cancer Father        lung   Hyperthyroidism Sister        Graves   Hypertension Sister    Congestive Heart Failure Sister    Stroke Paternal Grandmother        in 33s   Diabetes Neg Hx    Colon cancer Neg Hx    Esophageal cancer Neg Hx    Stomach cancer Neg Hx    Rectal cancer Neg Hx    Social History   Occupational History   Occupation: [employer] Technologist    Comment: Dr Oneita  Tobacco Use   Smoking status: Never    Passive exposure: Past   Smokeless tobacco: Never  Vaping Use   Vaping status: Never Used  Substance and Sexual Activity   Alcohol use: No    Alcohol/week: 0.0 standard drinks of alcohol   Drug use: No   Sexual activity: Not Currently   Tobacco Counseling Counseling given: No  SDOH Screenings   Food Insecurity: No Food Insecurity (12/27/2023)  Housing: Unknown (12/27/2023)  Transportation Needs: No Transportation Needs (12/27/2023)  Utilities: Not At Risk (12/27/2023)  Alcohol Screen: Low Risk (10/13/2021)  Depression (PHQ2-9): Low Risk (12/27/2023)  Financial Resource Strain: Low Risk (10/21/2022)  Physical Activity: Inactive (12/27/2023)  Social Connections: Moderately Integrated (12/27/2023)  Stress: No Stress Concern Present (12/27/2023)  Tobacco  Use: Low Risk (12/27/2023)  Health Literacy: Adequate Health Literacy (12/27/2023)   See flowsheets for full screening details  Depression Screen PHQ 2 & 9 Depression Scale- Over the past 2 weeks, how often have you been bothered by any of the following problems? Little interest or pleasure in doing things: 0 Feeling down, depressed, or hopeless (PHQ Adolescent also includes...irritable): 0 PHQ-2 Total Score: 0 Trouble falling or staying asleep, or sleeping too much: 1 (gets about 6hrs) Feeling tired or having little energy: 0 Poor appetite or overeating (PHQ Adolescent also includes...weight loss): 0 Feeling bad about yourself - or that you are a failure or have let yourself or your family down: 0 Trouble concentrating on  things, such as reading the newspaper or watching television [facility] Adolescent also includes...like school work): 0 Moving or speaking so slowly that other people could have noticed. Or the opposite - being so fidgety or restless that you have been moving around a lot more than usual: 3 (uses a walker) Thoughts that you would be better off dead, or of hurting yourself in some way: 0 PHQ-9 Total Score: 4 If you checked off any problems, how difficult have these problems made it for you to do your work, take care of things at home, or get along with other people?: Somewhat difficult  Depression Treatment Depression Interventions/Treatment : Medication; Currently on Treatment; PHQ2-9 Score <4 Follow-up Not Indicated     Goals Addressed               This Visit's Progress     Patient Stated (pt-stated)        Patient stated she plans to continue doing the exercises to strengthen legs to walk and stand longer; also managing her diet             Objective:    Today's Vitals   12/27/23 1019  Weight: 297 lb (134.7 kg)  Height: 5' 2 (1.575 m)   Body mass index is 54.32 kg/m.  Hearing/Vision screen Hearing Screening - Comments:: Denies hearing difficulties    Vision Screening - Comments:: Wears glasses for reading- up to date with routine eye exams with Charlie Gaudy &amp; Dr Tobie Immunizations and Health Maintenance Health Maintenance  Topic Date Due   DTaP/Tdap/Td (1 - Tdap) Never done   Zoster Vaccines- Shingrix (1 of 2) Never done   Influenza Vaccine  08/12/2023   COVID-19 Vaccine (4 - 2025-26 season) 09/12/2023   Medicare Annual Wellness (AWV)  12/26/2024   Pneumococcal Vaccine: 50+ Years  Completed   Meningococcal B Vaccine  Aged Out   Bone Density Scan  Discontinued   Colonoscopy  Discontinued   Hepatitis C Screening  Discontinued        Assessment/Plan:  This is a routine wellness examination for Gustavo.  Patient Care Team: Geofm Glade PARAS, MD as PCP - General (Internal Medicine) Raford Riggs, MD as PCP - Cardiology (Cardiology) Tobie Tonita POUR, DO as Consulting Physician (Neurology) Szabat, Toribio BROCKS, [facility] (Inactive) (Pharmacist) Tobie Baptist, MD as Consulting Physician (Ophthalmology) Gaudy, Charlie Hamilton, MD as Consulting Physician (Ophthalmology)  I have personally reviewed and noted the following in the patients chart:   Medical and social history Use of alcohol, tobacco or illicit drugs  Current medications and supplements including opioid prescriptions. Functional ability and status Nutritional status Physical activity Advanced directives List of other physicians Hospitalizations, surgeries, and ER visits in previous 12 months Vitals Screenings to include cognitive, depression, and falls Referrals and appointments  No orders of the defined types were placed in this encounter.  In addition, I have reviewed and discussed with patient certain preventive protocols, quality metrics, and best practice recommendations. A written personalized care plan for preventive services as well as general preventive health recommendations were provided to patient.   Verdie CHRISTELLA Saba, CMA   12/27/2023   Return in 1  year (on 12/26/2024).  After Visit Summary: (Declined) Due to this being a telephonic visit, with patients personalized plan was offered to patient but patient Declined AVS at this time   Nurse Notes: Appointment(s) made: (scheduled 2026 CPE appt w/PCP)

## 2023-12-27 NOTE — Patient Instructions (Addendum)
 Kimberly [name],  Thank you for taking the time for your Medicare Wellness Visit. I appreciate your continued commitment to your health goals. Please review the care plan we discussed, and feel free to reach out if I can assist you further.  Please note that Annual Wellness Visits do not include a physical exam. Some assessments may be limited, especially if the visit was conducted virtually. If needed, we may recommend an in-person follow-up with your provider.  Ongoing Care Seeing your primary care provider every 3 to 6 months helps us  monitor your health and provide consistent, personalized care.   Referrals If a referral was made during today's visit and you haven't received any updates within two weeks, please contact the referred provider directly to check on the status.  Recommended Screenings:  Health Maintenance  Topic Date Due   DTaP/Tdap/Td vaccine (1 - Tdap) Never done   Zoster (Shingles) Vaccine (1 of 2) Never done   Flu Shot  08/12/2023   COVID-19 Vaccine (4 - 2025-26 season) 09/12/2023   Medicare Annual Wellness Visit  12/26/2024   Pneumococcal Vaccine for age over 6  Completed   Meningitis B Vaccine  Aged Out   Osteoporosis screening with Bone Density Scan  Discontinued   Colon Cancer Screening  Discontinued   Hepatitis C Screening  Discontinued       12/27/2023   10:23 AM  Advanced Directives  Does Patient Have a Medical Advance Directive? No  Would patient like information on creating a medical advance directive? Yes (MAU/Ambulatory/Procedural Areas - Information given)    Vision: Annual vision screenings are recommended for early detection of glaucoma, cataracts, and diabetic retinopathy. These exams can also reveal signs of chronic conditions such as diabetes and high blood pressure.  Dental: Annual dental screenings help detect early signs of oral cancer, gum disease, and other conditions linked to overall health, including heart disease and diabetes.

## 2023-12-28 NOTE — Telephone Encounter (Signed)
 I am not sure why they are advising that she does not get the vaccine.  I would recommend it.  It is not a live vaccine and safe for anyone who is immunocompromised or with autoimmune diseases.

## 2023-12-28 NOTE — Telephone Encounter (Signed)
 Spoke with patient today and info given.

## 2023-12-29 ENCOUNTER — Other Ambulatory Visit: Payer: Self-pay | Admitting: Neurology

## 2024-01-18 ENCOUNTER — Other Ambulatory Visit: Payer: Self-pay | Admitting: Internal Medicine

## 2024-01-24 ENCOUNTER — Telehealth: Payer: Self-pay

## 2024-01-24 NOTE — Telephone Encounter (Signed)
 Auth Submission: NO AUTH NEEDED Site of care: Site of care: [facility] Payer: Medicare A/B with BCBS supplement Medication & CPT/J Code(s) submitted: Leqvio  (Inclisiran) J1306 Diagnosis Code:  Route of submission (phone, fax, portal):  Phone # Fax # Auth type: Buy/Bill PB Units/visits requested: 284mg  x 2 doses Reference number:  Approval from: 01/24/24 to 02/10/25

## 2024-04-10 ENCOUNTER — Ambulatory Visit: Payer: Self-pay | Admitting: Neurology

## 2024-05-02 ENCOUNTER — Ambulatory Visit (HOSPITAL_BASED_OUTPATIENT_CLINIC_OR_DEPARTMENT_OTHER): Admitting: Cardiovascular Disease

## 2024-06-14 ENCOUNTER — Ambulatory Visit

## 2024-07-02 ENCOUNTER — Encounter: Admitting: Internal Medicine

## 2024-08-09 ENCOUNTER — Ambulatory Visit: Admitting: Rheumatology

## 2024-12-28 ENCOUNTER — Ambulatory Visit
